# Patient Record
Sex: Female | Born: 1975 | Race: Black or African American | Hispanic: No | Marital: Single | Smoking: Never smoker
Health system: Southern US, Community
[De-identification: ages and names within clinical notes are randomized; demographics above are authoritative.]

## PROBLEM LIST (undated history)

## (undated) DIAGNOSIS — Z87442 Personal history of urinary calculi: Secondary | ICD-10-CM

## (undated) DIAGNOSIS — K219 Gastro-esophageal reflux disease without esophagitis: Secondary | ICD-10-CM

## (undated) DIAGNOSIS — R760 Raised antibody titer: Secondary | ICD-10-CM

## (undated) DIAGNOSIS — J189 Pneumonia, unspecified organism: Secondary | ICD-10-CM

## (undated) DIAGNOSIS — E669 Obesity, unspecified: Secondary | ICD-10-CM

## (undated) DIAGNOSIS — D649 Anemia, unspecified: Secondary | ICD-10-CM

## (undated) DIAGNOSIS — E611 Iron deficiency: Secondary | ICD-10-CM

## (undated) DIAGNOSIS — Z8719 Personal history of other diseases of the digestive system: Secondary | ICD-10-CM

## (undated) DIAGNOSIS — I890 Lymphedema, not elsewhere classified: Secondary | ICD-10-CM

## (undated) DIAGNOSIS — M1990 Unspecified osteoarthritis, unspecified site: Secondary | ICD-10-CM

## (undated) DIAGNOSIS — G4730 Sleep apnea, unspecified: Secondary | ICD-10-CM

## (undated) DIAGNOSIS — E538 Deficiency of other specified B group vitamins: Secondary | ICD-10-CM

## (undated) DIAGNOSIS — J45909 Unspecified asthma, uncomplicated: Secondary | ICD-10-CM

## (undated) DIAGNOSIS — M5136 Other intervertebral disc degeneration, lumbar region: Secondary | ICD-10-CM

## (undated) DIAGNOSIS — I2699 Other pulmonary embolism without acute cor pulmonale: Secondary | ICD-10-CM

## (undated) DIAGNOSIS — I872 Venous insufficiency (chronic) (peripheral): Secondary | ICD-10-CM

## (undated) DIAGNOSIS — M51369 Other intervertebral disc degeneration, lumbar region without mention of lumbar back pain or lower extremity pain: Secondary | ICD-10-CM

## (undated) DIAGNOSIS — R519 Headache, unspecified: Secondary | ICD-10-CM

## (undated) HISTORY — PX: BREAST REDUCTION SURGERY: SHX8

## (undated) HISTORY — PX: PLANTAR FASCIA SURGERY: SHX746

## (undated) HISTORY — PX: HERNIA REPAIR: SHX51

## (undated) HISTORY — DX: Other intervertebral disc degeneration, lumbar region: M51.36

## (undated) HISTORY — PX: GASTRIC BYPASS: SHX52

## (undated) HISTORY — DX: Other intervertebral disc degeneration, lumbar region without mention of lumbar back pain or lower extremity pain: M51.369

## (undated) HISTORY — PX: KNEE SURGERY: SHX244

## (undated) HISTORY — PX: CHOLECYSTECTOMY: SHX55

## (undated) HISTORY — PX: NASAL SINUS SURGERY: SHX719

## (undated) HISTORY — PX: COLONOSCOPY: SHX174

---

## 2003-01-18 HISTORY — PX: REDUCTION MAMMAPLASTY: SUR839

## 2003-02-18 ENCOUNTER — Other Ambulatory Visit: Payer: Self-pay

## 2003-12-01 ENCOUNTER — Emergency Department: Payer: Self-pay | Admitting: Emergency Medicine

## 2004-01-18 HISTORY — PX: BREAST BIOPSY: SHX20

## 2004-08-10 ENCOUNTER — Emergency Department: Payer: Self-pay | Admitting: Emergency Medicine

## 2004-08-19 ENCOUNTER — Ambulatory Visit: Payer: Self-pay | Admitting: Internal Medicine

## 2005-01-18 ENCOUNTER — Ambulatory Visit: Payer: Self-pay

## 2005-07-18 ENCOUNTER — Ambulatory Visit: Payer: Self-pay | Admitting: General Surgery

## 2005-09-16 ENCOUNTER — Ambulatory Visit: Payer: Self-pay | Admitting: Podiatry

## 2005-11-30 ENCOUNTER — Ambulatory Visit: Payer: Self-pay | Admitting: Internal Medicine

## 2006-06-14 ENCOUNTER — Emergency Department: Payer: Self-pay | Admitting: Emergency Medicine

## 2006-06-14 ENCOUNTER — Other Ambulatory Visit: Payer: Self-pay

## 2006-06-19 ENCOUNTER — Emergency Department: Payer: Self-pay | Admitting: Emergency Medicine

## 2006-06-19 ENCOUNTER — Other Ambulatory Visit: Payer: Self-pay

## 2006-06-29 ENCOUNTER — Ambulatory Visit: Payer: Self-pay | Admitting: Internal Medicine

## 2006-08-17 ENCOUNTER — Ambulatory Visit: Payer: Self-pay | Admitting: Specialist

## 2007-03-06 ENCOUNTER — Ambulatory Visit: Payer: Self-pay | Admitting: Internal Medicine

## 2007-06-06 ENCOUNTER — Emergency Department: Payer: Self-pay | Admitting: Emergency Medicine

## 2007-10-08 ENCOUNTER — Emergency Department: Payer: Self-pay | Admitting: Emergency Medicine

## 2008-02-04 ENCOUNTER — Emergency Department: Payer: Self-pay | Admitting: Emergency Medicine

## 2008-08-01 ENCOUNTER — Emergency Department: Payer: Self-pay | Admitting: Emergency Medicine

## 2008-12-02 ENCOUNTER — Ambulatory Visit: Payer: Self-pay | Admitting: Internal Medicine

## 2008-12-09 ENCOUNTER — Ambulatory Visit: Payer: Self-pay | Admitting: Internal Medicine

## 2008-12-10 ENCOUNTER — Ambulatory Visit: Payer: Self-pay | Admitting: Podiatry

## 2009-09-27 ENCOUNTER — Emergency Department: Payer: Self-pay | Admitting: Emergency Medicine

## 2009-10-15 ENCOUNTER — Ambulatory Visit: Payer: Self-pay | Admitting: Unknown Physician Specialty

## 2009-11-18 ENCOUNTER — Ambulatory Visit: Payer: Self-pay | Admitting: Unknown Physician Specialty

## 2009-11-23 ENCOUNTER — Emergency Department: Payer: Self-pay | Admitting: Emergency Medicine

## 2009-11-25 ENCOUNTER — Emergency Department: Payer: Self-pay | Admitting: Emergency Medicine

## 2010-05-20 ENCOUNTER — Emergency Department: Payer: Self-pay | Admitting: Emergency Medicine

## 2010-11-09 ENCOUNTER — Emergency Department: Payer: Self-pay | Admitting: Emergency Medicine

## 2010-11-10 ENCOUNTER — Emergency Department (HOSPITAL_COMMUNITY): Payer: BC Managed Care – PPO

## 2010-11-10 ENCOUNTER — Emergency Department: Payer: Self-pay | Admitting: *Deleted

## 2010-11-10 ENCOUNTER — Emergency Department (HOSPITAL_COMMUNITY)
Admission: EM | Admit: 2010-11-10 | Discharge: 2010-11-10 | Disposition: A | Discharge status: Home / Self Care | Payer: BC Managed Care – PPO | Attending: Emergency Medicine | Admitting: Emergency Medicine

## 2010-11-10 DIAGNOSIS — Z86718 Personal history of other venous thrombosis and embolism: Secondary | ICD-10-CM | POA: Insufficient documentation

## 2010-11-10 DIAGNOSIS — R0602 Shortness of breath: Secondary | ICD-10-CM | POA: Insufficient documentation

## 2010-11-10 DIAGNOSIS — M542 Cervicalgia: Secondary | ICD-10-CM | POA: Insufficient documentation

## 2010-11-10 DIAGNOSIS — R071 Chest pain on breathing: Secondary | ICD-10-CM | POA: Insufficient documentation

## 2010-11-10 DIAGNOSIS — R51 Headache: Secondary | ICD-10-CM | POA: Insufficient documentation

## 2010-11-10 LAB — POCT I-STAT, CHEM 8
Calcium, Ion: 1.19 mmol/L (ref 1.12–1.32)
Chloride: 104 mEq/L (ref 96–112)
Glucose, Bld: 87 mg/dL (ref 70–99)
HCT: 36 % (ref 36.0–46.0)
TCO2: 23 mmol/L (ref 0–100)

## 2010-11-10 LAB — CBC
MCH: 29.3 pg (ref 26.0–34.0)
MCHC: 34.4 g/dL (ref 30.0–36.0)
MCV: 85.2 fL (ref 78.0–100.0)
Platelets: 320 10*3/uL (ref 150–400)
RDW: 13.9 % (ref 11.5–15.5)

## 2010-11-10 LAB — DIFFERENTIAL
Basophils Relative: 1 % (ref 0–1)
Eosinophils Absolute: 0.1 10*3/uL (ref 0.0–0.7)
Eosinophils Relative: 2 % (ref 0–5)
Lymphs Abs: 2.3 10*3/uL (ref 0.7–4.0)
Monocytes Absolute: 0.6 10*3/uL (ref 0.1–1.0)
Monocytes Relative: 9 % (ref 3–12)
Neutrophils Relative %: 57 % (ref 43–77)

## 2010-11-10 LAB — BASIC METABOLIC PANEL
BUN: 8 mg/dL (ref 6–23)
Creatinine, Ser: 0.69 mg/dL (ref 0.50–1.10)
GFR calc Af Amer: >90 mL/min (ref >90)
GFR calc non Af Amer: >90 mL/min (ref >90)
Glucose, Bld: 96 mg/dL (ref 70–99)
Potassium: 4 mEq/L (ref 3.5–5.1)

## 2010-11-10 LAB — POCT I-STAT TROPONIN I: Troponin i, poc: 0.01 ng/mL (ref 0.00–0.08)

## 2010-11-10 MED ORDER — IOHEXOL 300 MG/ML  SOLN
70.0000 mL | Freq: Once | INTRAMUSCULAR | Status: AC | PRN
  Administered 2010-11-10: 70 mL via INTRAVENOUS

## 2010-11-24 ENCOUNTER — Ambulatory Visit: Payer: Self-pay | Admitting: Internal Medicine

## 2011-05-26 ENCOUNTER — Ambulatory Visit: Payer: Self-pay | Admitting: Allergy and Immunology

## 2011-06-27 ENCOUNTER — Emergency Department: Payer: Self-pay | Admitting: Emergency Medicine

## 2011-09-23 ENCOUNTER — Emergency Department: Payer: Self-pay | Admitting: Emergency Medicine

## 2011-11-16 ENCOUNTER — Emergency Department: Payer: Self-pay | Admitting: Internal Medicine

## 2011-11-16 LAB — CBC WITH DIFFERENTIAL/PLATELET
Basophil #: 0 10*3/uL (ref 0.0–0.1)
Basophil %: 0.3 %
HCT: 39 % (ref 35.0–47.0)
HGB: 13.1 g/dL (ref 12.0–16.0)
Lymphocyte #: 1.7 10*3/uL (ref 1.0–3.6)
Lymphocyte %: 25.8 %
MCHC: 33.5 g/dL (ref 32.0–36.0)
MCV: 85 fL (ref 80–100)
Monocyte %: 7.6 %
Neutrophil #: 4 10*3/uL (ref 1.4–6.5)
RDW: 14.1 % (ref 11.5–14.5)

## 2011-11-16 LAB — BASIC METABOLIC PANEL
Anion Gap: 9 (ref 7–16)
BUN: 9 mg/dL (ref 7–18)
Calcium, Total: 8.8 mg/dL (ref 8.5–10.1)
EGFR (African American): >60
EGFR (Non-African Amer.): >60
Glucose: 87 mg/dL (ref 65–99)
Osmolality: 283 (ref 275–301)

## 2011-12-07 ENCOUNTER — Ambulatory Visit: Payer: Self-pay | Admitting: Obstetrics and Gynecology

## 2012-02-28 ENCOUNTER — Ambulatory Visit: Payer: Self-pay | Admitting: Obstetrics and Gynecology

## 2012-04-21 ENCOUNTER — Emergency Department: Payer: Self-pay | Admitting: Unknown Physician Specialty

## 2012-04-21 LAB — BASIC METABOLIC PANEL
Anion Gap: 7 (ref 7–16)
Calcium, Total: 8.3 mg/dL — ABNORMAL LOW (ref 8.5–10.1)
Chloride: 113 mmol/L — ABNORMAL HIGH (ref 98–107)
Co2: 20 mmol/L — ABNORMAL LOW (ref 21–32)
EGFR (African American): >60
EGFR (Non-African Amer.): >60
Sodium: 140 mmol/L (ref 136–145)

## 2012-04-21 LAB — CBC
HCT: 36.8 % (ref 35.0–47.0)
MCV: 85 fL (ref 80–100)
Platelet: 293 10*3/uL (ref 150–440)
RDW: 14.7 % — ABNORMAL HIGH (ref 11.5–14.5)
WBC: 8.2 10*3/uL (ref 3.6–11.0)

## 2012-04-21 LAB — TROPONIN I
Troponin-I: <0.02 ng/mL
Troponin-I: <0.02 ng/mL

## 2012-05-03 ENCOUNTER — Emergency Department: Payer: Self-pay | Admitting: Emergency Medicine

## 2012-05-03 LAB — URINALYSIS, COMPLETE
Bacteria: NONE SEEN
Bilirubin,UR: NEGATIVE
Nitrite: NEGATIVE
RBC,UR: 1 /HPF (ref 0–5)
Specific Gravity: 1.016 (ref 1.003–1.030)

## 2012-05-03 LAB — CBC
MCHC: 33.3 g/dL (ref 32.0–36.0)
WBC: 7.9 10*3/uL (ref 3.6–11.0)

## 2012-05-03 LAB — COMPREHENSIVE METABOLIC PANEL
Alkaline Phosphatase: 102 U/L (ref 50–136)
Anion Gap: 6 — ABNORMAL LOW (ref 7–16)
Bilirubin,Total: 0.2 mg/dL (ref 0.2–1.0)
Calcium, Total: 8.4 mg/dL — ABNORMAL LOW (ref 8.5–10.1)
Chloride: 113 mmol/L — ABNORMAL HIGH (ref 98–107)
Co2: 23 mmol/L (ref 21–32)
Creatinine: 0.98 mg/dL (ref 0.60–1.30)
EGFR (Non-African Amer.): >60
Potassium: 3.4 mmol/L — ABNORMAL LOW (ref 3.5–5.1)
Sodium: 142 mmol/L (ref 136–145)

## 2012-05-03 LAB — CK TOTAL AND CKMB (NOT AT ARMC): CK-MB: <0.5 ng/mL — ABNORMAL LOW (ref 0.5–3.6)

## 2012-05-03 LAB — TROPONIN I: Troponin-I: <0.02 ng/mL

## 2012-06-07 IMAGING — MG MAM DGTL SCRN MAM NO ORDER W/CAD
1 series · 5 of 5 positions shown · non-contrast
Comparison: none

REASON FOR EXAM: scr mammo no order
COMMENTS:

[Series 9272: R CC · right · 5 of 5 slices shown]
[im 1/5]
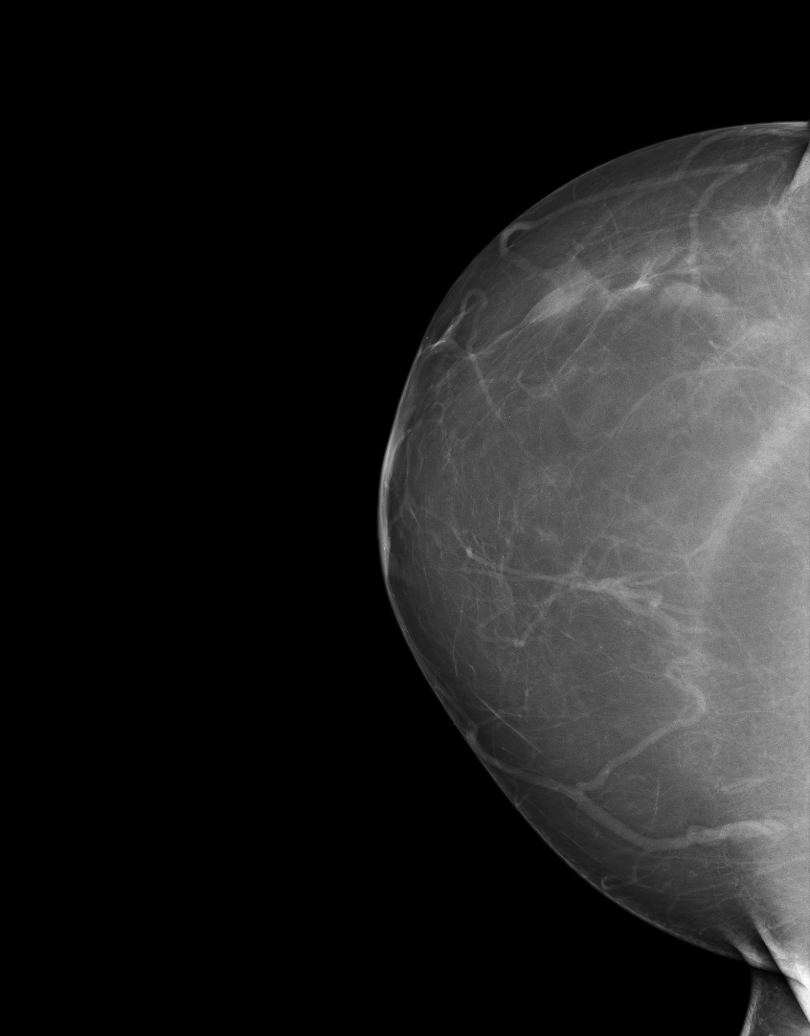
[im 2/5]
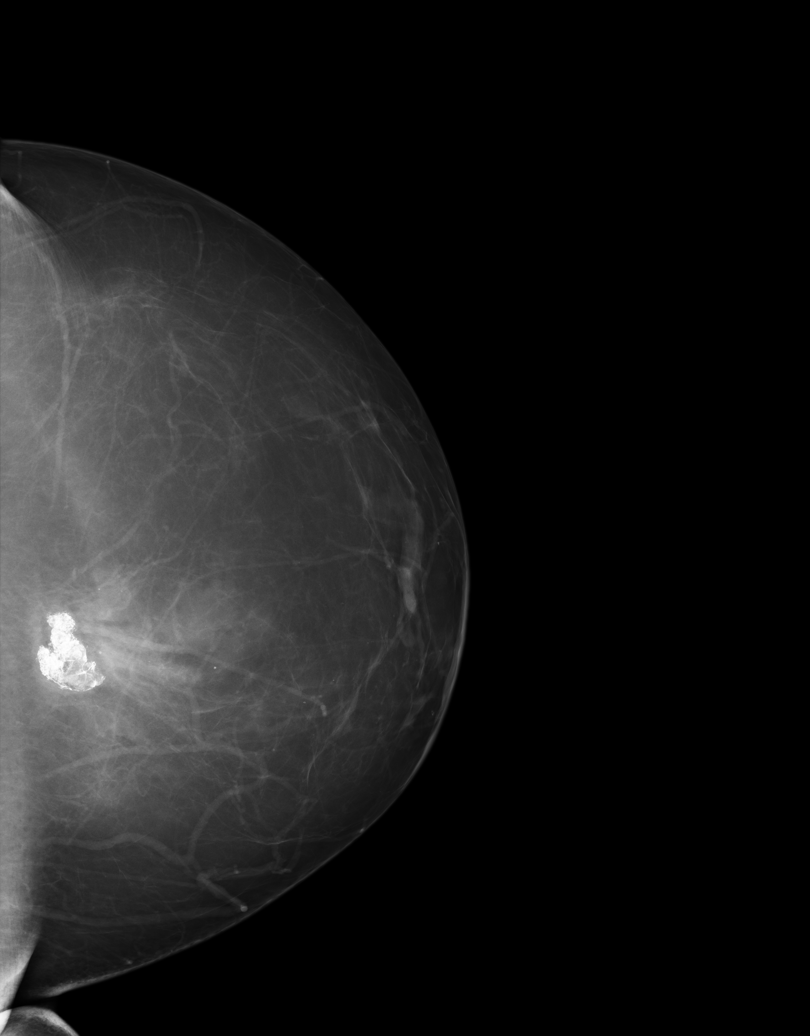
[im 3/5]
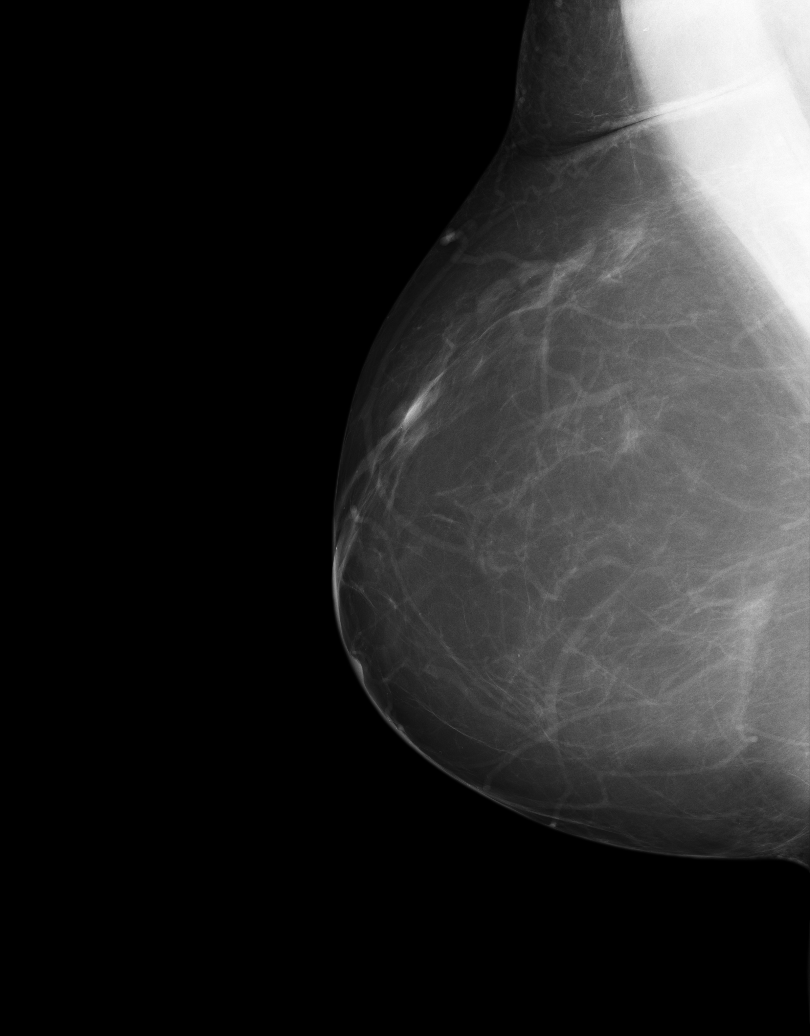
[im 4/5]
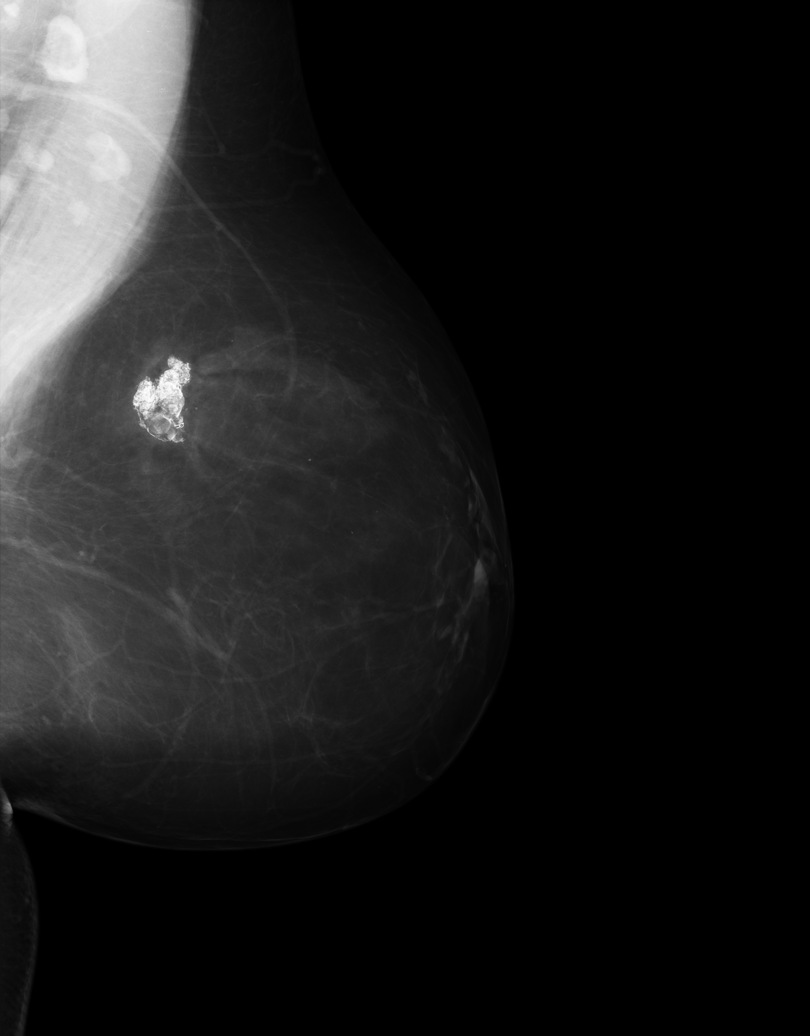
[im 5/5]
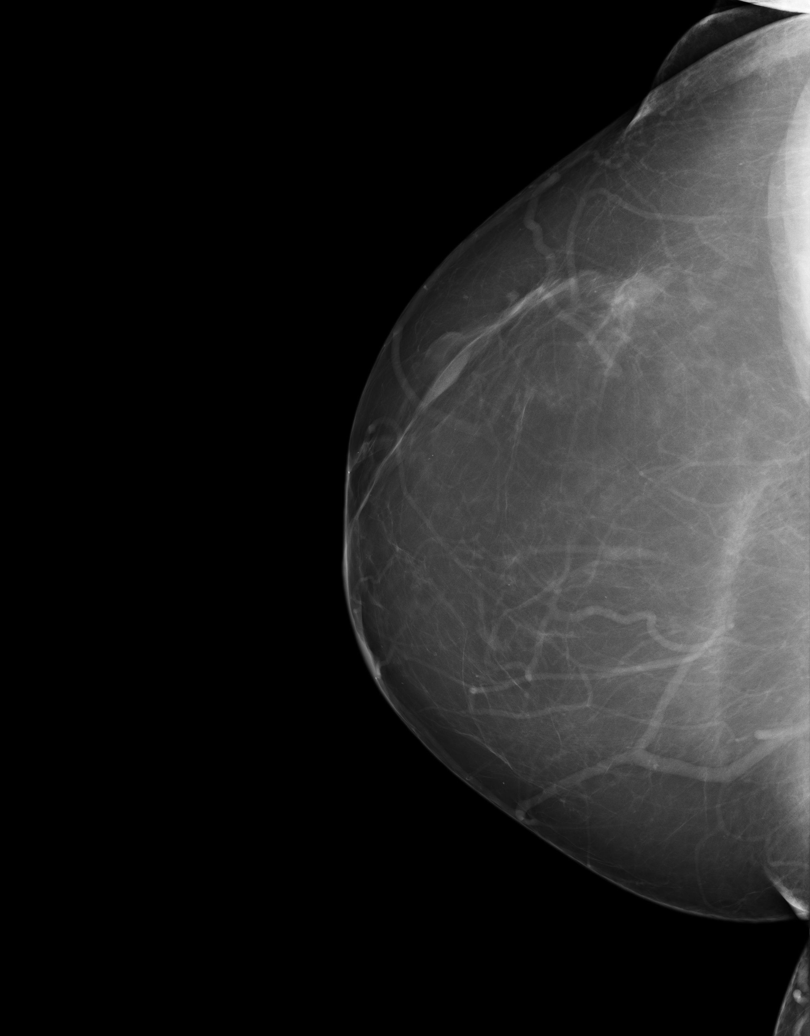

[5 of 5 positions shown; findings below may reference images not displayed]

PROCEDURE:     MAM - MAM DGTL SCRN MAM NO ORDER W/CAD  - November 24, 2010  [DATE]

RESULT:     There is a family history of breast cancer in the patient's
mother. The patient has undergone previous left breast biopsy in 2997 with
bilateral breast reduction in December 2003.  Comparison is made to the
previous digital images of 12-02-2008, as well as 07-18-2005. Coarse
calcification is seen along the medial posterior upper left breast. This is
much smaller than seen on the 2997 study but more densely calcified and is
likely postoperative change. No developing parenchymal density or dominant
mass is present.
IMPRESSION: 1.Stable, benign appearing bilateral mammogram.

BI-RADS: Category 2 - Benign Findings

RECOMMENDATIONS:

1.     Please continue to encourage yearly mammographic follow-up.

A NEGATIVE MAMMOGRAM REPORT DOES NOT PRECLUDE BIOPSY OR OTHER EVALUATION OF
A CLINICALLY PALPABLE OR OTHERWISE SUSPICIOUS MASS OR LESION. BREAST CANCER
MAY NOT BE DETECTED BY MAMMOGRAPHY IN UP TO 10% OF CASES.

## 2012-10-23 ENCOUNTER — Ambulatory Visit: Payer: Self-pay | Admitting: Obstetrics and Gynecology

## 2012-11-11 ENCOUNTER — Emergency Department: Payer: Self-pay | Admitting: Emergency Medicine

## 2012-11-11 LAB — URINALYSIS, COMPLETE
Leukocyte Esterase: NEGATIVE
Nitrite: NEGATIVE
Ph: 5 (ref 4.5–8.0)
Protein: 100

## 2012-12-31 ENCOUNTER — Emergency Department: Payer: Self-pay | Admitting: Emergency Medicine

## 2013-01-17 HISTORY — PX: BREAST BIOPSY: SHX20

## 2013-04-10 ENCOUNTER — Ambulatory Visit: Payer: Self-pay | Admitting: Obstetrics and Gynecology

## 2013-04-22 ENCOUNTER — Ambulatory Visit: Payer: Self-pay | Admitting: Obstetrics and Gynecology

## 2013-05-06 ENCOUNTER — Ambulatory Visit: Payer: Self-pay | Admitting: Obstetrics and Gynecology

## 2013-05-08 LAB — PATHOLOGY REPORT

## 2013-06-19 ENCOUNTER — Emergency Department: Payer: Self-pay | Admitting: Emergency Medicine

## 2013-06-19 LAB — URINALYSIS, COMPLETE
BACTERIA: NONE SEEN
BILIRUBIN, UR: NEGATIVE
Glucose,UR: NEGATIVE mg/dL (ref 0–75)
KETONE: NEGATIVE
Nitrite: NEGATIVE
PH: 7 (ref 4.5–8.0)
PROTEIN: NEGATIVE
RBC,UR: 2 /HPF (ref 0–5)
SPECIFIC GRAVITY: 1.018 (ref 1.003–1.030)
WBC UR: 2 /HPF (ref 0–5)

## 2013-06-19 LAB — CBC WITH DIFFERENTIAL/PLATELET
BASOS ABS: 0.1 10*3/uL (ref 0.0–0.1)
BASOS PCT: 0.8 %
EOS ABS: 0.2 10*3/uL (ref 0.0–0.7)
Eosinophil %: 2.9 %
HCT: 38.4 % (ref 35.0–47.0)
HGB: 12.7 g/dL (ref 12.0–16.0)
LYMPHS PCT: 32.6 %
Lymphocyte #: 2.5 10*3/uL (ref 1.0–3.6)
MCH: 28.6 pg (ref 26.0–34.0)
MCHC: 33 g/dL (ref 32.0–36.0)
MCV: 87 fL (ref 80–100)
MONOS PCT: 7.6 %
Monocyte #: 0.6 x10 3/mm (ref 0.2–0.9)
NEUTROS ABS: 4.4 10*3/uL (ref 1.4–6.5)
Neutrophil %: 56.1 %
Platelet: 281 10*3/uL (ref 150–440)
RBC: 4.43 10*6/uL (ref 3.80–5.20)
RDW: 14.7 % — AB (ref 11.5–14.5)
WBC: 7.8 10*3/uL (ref 3.6–11.0)

## 2013-06-19 LAB — COMPREHENSIVE METABOLIC PANEL
ALBUMIN: 3.3 g/dL — AB (ref 3.4–5.0)
ALK PHOS: 101 U/L
ALT: 25 U/L (ref 12–78)
ANION GAP: 7 (ref 7–16)
BILIRUBIN TOTAL: 0.3 mg/dL (ref 0.2–1.0)
BUN: 7 mg/dL (ref 7–18)
CHLORIDE: 105 mmol/L (ref 98–107)
CO2: 26 mmol/L (ref 21–32)
Calcium, Total: 8.5 mg/dL (ref 8.5–10.1)
Creatinine: 0.7 mg/dL (ref 0.60–1.30)
EGFR (African American): >60
Glucose: 85 mg/dL (ref 65–99)
OSMOLALITY: 273 (ref 275–301)
POTASSIUM: 3.6 mmol/L (ref 3.5–5.1)
SGOT(AST): 19 U/L (ref 15–37)
Sodium: 138 mmol/L (ref 136–145)
TOTAL PROTEIN: 7.4 g/dL (ref 6.4–8.2)

## 2013-06-19 LAB — PREGNANCY, URINE: Pregnancy Test, Urine: NEGATIVE m[IU]/mL

## 2013-08-21 ENCOUNTER — Emergency Department: Payer: Self-pay | Admitting: Emergency Medicine

## 2013-08-23 ENCOUNTER — Emergency Department: Payer: Self-pay | Admitting: Emergency Medicine

## 2013-08-23 LAB — COMPREHENSIVE METABOLIC PANEL
ALBUMIN: 3.4 g/dL (ref 3.4–5.0)
ALK PHOS: 99 U/L
AST: 23 U/L (ref 15–37)
Anion Gap: 10 (ref 7–16)
BUN: 7 mg/dL (ref 7–18)
Bilirubin,Total: 0.2 mg/dL (ref 0.2–1.0)
CALCIUM: 8.6 mg/dL (ref 8.5–10.1)
CHLORIDE: 105 mmol/L (ref 98–107)
CREATININE: 0.84 mg/dL (ref 0.60–1.30)
Co2: 25 mmol/L (ref 21–32)
EGFR (African American): >60
Glucose: 86 mg/dL (ref 65–99)
OSMOLALITY: 277 (ref 275–301)
POTASSIUM: 3.7 mmol/L (ref 3.5–5.1)
SGPT (ALT): 24 U/L
Sodium: 140 mmol/L (ref 136–145)
Total Protein: 7.9 g/dL (ref 6.4–8.2)

## 2013-08-23 LAB — URINALYSIS, COMPLETE
Bilirubin,UR: NEGATIVE
Glucose,UR: NEGATIVE mg/dL (ref 0–75)
Ketone: NEGATIVE
Nitrite: NEGATIVE
Ph: 6 (ref 4.5–8.0)
Specific Gravity: 1.008 (ref 1.003–1.030)
Squamous Epithelial: 9
WBC UR: 110 /HPF (ref 0–5)

## 2013-08-23 LAB — CBC WITH DIFFERENTIAL/PLATELET
Basophil #: 0.1 10*3/uL (ref 0.0–0.1)
Basophil %: 0.8 %
EOS ABS: 0.2 10*3/uL (ref 0.0–0.7)
Eosinophil %: 3.5 %
HCT: 40.3 % (ref 35.0–47.0)
HGB: 13 g/dL (ref 12.0–16.0)
LYMPHS ABS: 2.3 10*3/uL (ref 1.0–3.6)
Lymphocyte %: 33 %
MCH: 28.3 pg (ref 26.0–34.0)
MCHC: 32.3 g/dL (ref 32.0–36.0)
MCV: 87 fL (ref 80–100)
MONOS PCT: 8.4 %
Monocyte #: 0.6 x10 3/mm (ref 0.2–0.9)
NEUTROS PCT: 54.3 %
Neutrophil #: 3.8 10*3/uL (ref 1.4–6.5)
Platelet: 332 10*3/uL (ref 150–440)
RBC: 4.61 10*6/uL (ref 3.80–5.20)
RDW: 14.6 % — ABNORMAL HIGH (ref 11.5–14.5)
WBC: 7 10*3/uL (ref 3.6–11.0)

## 2013-08-23 LAB — GC/CHLAMYDIA PROBE AMP

## 2013-08-23 LAB — WET PREP, GENITAL

## 2013-12-31 DIAGNOSIS — M722 Plantar fascial fibromatosis: Secondary | ICD-10-CM | POA: Insufficient documentation

## 2014-01-08 ENCOUNTER — Emergency Department: Payer: Self-pay | Admitting: Emergency Medicine

## 2014-02-26 ENCOUNTER — Emergency Department: Payer: Self-pay | Admitting: Emergency Medicine

## 2014-02-27 ENCOUNTER — Emergency Department: Payer: Self-pay | Admitting: Emergency Medicine

## 2014-02-28 ENCOUNTER — Emergency Department: Payer: Self-pay | Admitting: Emergency Medicine

## 2014-04-24 ENCOUNTER — Ambulatory Visit
Admit: 2014-04-24 | Disposition: A | Discharge status: Home / Self Care | Payer: Self-pay | Attending: Obstetrics and Gynecology | Admitting: Obstetrics and Gynecology

## 2014-06-18 ENCOUNTER — Emergency Department: Payer: BC Managed Care – PPO

## 2014-06-18 ENCOUNTER — Encounter: Payer: Self-pay | Admitting: Urgent Care

## 2014-06-18 ENCOUNTER — Emergency Department
Admission: EM | Admit: 2014-06-18 | Discharge: 2014-06-18 | Disposition: A | Discharge status: Home / Self Care | Payer: BC Managed Care – PPO | Attending: Emergency Medicine | Admitting: Emergency Medicine

## 2014-06-18 DIAGNOSIS — Y9289 Other specified places as the place of occurrence of the external cause: Secondary | ICD-10-CM | POA: Insufficient documentation

## 2014-06-18 DIAGNOSIS — Z91040 Latex allergy status: Secondary | ICD-10-CM | POA: Diagnosis not present

## 2014-06-18 DIAGNOSIS — W2209XA Striking against other stationary object, initial encounter: Secondary | ICD-10-CM | POA: Diagnosis not present

## 2014-06-18 DIAGNOSIS — Y9389 Activity, other specified: Secondary | ICD-10-CM | POA: Insufficient documentation

## 2014-06-18 DIAGNOSIS — S9032XA Contusion of left foot, initial encounter: Secondary | ICD-10-CM

## 2014-06-18 DIAGNOSIS — Y998 Other external cause status: Secondary | ICD-10-CM | POA: Insufficient documentation

## 2014-06-18 DIAGNOSIS — S99922A Unspecified injury of left foot, initial encounter: Secondary | ICD-10-CM | POA: Diagnosis present

## 2014-06-18 MED ORDER — KETOROLAC TROMETHAMINE 10 MG PO TABS: 10.0000 mg | ORAL_TABLET | Freq: Three times a day (TID) | ORAL | Status: DC | PRN

## 2014-06-18 MED ORDER — KETOROLAC TROMETHAMINE 10 MG PO TABS
ORAL_TABLET | ORAL | Status: AC
  Administered 2014-06-18: 10 mg via ORAL
  Filled 2014-06-18: qty 1

## 2014-06-18 MED ORDER — KETOROLAC TROMETHAMINE 10 MG PO TABS
10.0000 mg | ORAL_TABLET | Freq: Once | ORAL | Status: AC
  Administered 2014-06-18: 10 mg via ORAL

## 2014-06-18 NOTE — ED Notes (Signed)
Patient presents with c/o LEFT foot pain s/p "hitting it on the side of the bathtub".

## 2014-06-18 NOTE — ED Provider Notes (Signed)
[facility] Emergency Department Provider Note  ____________________________________________  Time seen: 4:30 AM  I have reviewed the triage vital signs and the nursing notes.   HISTORY  Chief Complaint Foot Pain      HPI Debbie Sosa is a 39 y.o. female presents with history of accidentally striking left foot against the bathtub prior to presentation. Patient admits to currently 8 out of 10 pain positive swelling noted to the foot.     History reviewed. No pertinent past medical history.  There are no active problems to display for this patient.   Past Surgical History  Procedure Laterality Date  . Cesarean section    . Cholecystectomy    . Knee surgery    . Plantar fascia surgery      x 3  . Breast reduction surgery      No current outpatient prescriptions on file.  Allergies Latex; Morphine and related; Pyridium; Sulfa antibiotics; Tramadol; and Vioxx  No family history on file.  Social History History  Substance Use Topics  . Smoking status: Never Smoker   . Smokeless tobacco: Not on file  . Alcohol Use: No    Review of Systems  Constitutional: Negative for fever. Eyes: Negative for visual changes. ENT: Negative for sore throat. Cardiovascular: Negative for chest pain. Respiratory: Negative for shortness of breath. Gastrointestinal: Negative for abdominal pain, vomiting and diarrhea. Genitourinary: Negative for dysuria. Musculoskeletal: Negative for back pain. Positive left foot pain and swelling Skin: Negative for rash. Neurological: Negative for headaches, focal weakness or numbness.   10-point ROS otherwise negative.  ____________________________________________   PHYSICAL EXAM:  VITAL SIGNS: ED Triage Vitals  Enc Vitals Group     BP --      Pulse --      Resp --      Temp --      Temp src --      SpO2 --      Weight --      Height --      Head Cir --      Peak Flow --      Pain Score 06/18/14  0055 10     Pain Loc --      Pain Edu? --      Excl. in [place]? --      Constitutional: Alert and oriented. Well appearing and in no distress. Eyes: Conjunctivae are normal. PERRL. Normal extraocular movements. ENT   Head: Normocephalic and atraumatic.   Nose: No congestion/rhinnorhea.   Mouth/Throat: Mucous membranes are moist.   Neck: No stridor. Hematological/Lymphatic/Immunilogical: No cervical lymphadenopathy. Cardiovascular: Normal rate, regular rhythm. Normal and symmetric distal pulses are present in all extremities. No murmurs, rubs, or gallops. Respiratory: Normal respiratory effort without tachypnea nor retractions. Breath sounds are clear and equal bilaterally. No wheezes/rales/rhonchi. Gastrointestinal: Soft and nontender. No distention. There is no CVA tenderness. Genitourinary: deferred Musculoskeletal: Nontender with normal range of motion in all extremities. No joint effusions.  No lower extremity tenderness nor edema. Dorsal aspect of left foot pain with palpation and swelling. Neurologic:  Normal speech and language. No gross focal neurologic deficits are appreciated. Speech is normal.  Skin:  Skin is warm, dry and intact. No rash noted. Psychiatric: Mood and affect are normal. Speech and behavior are normal. Patient exhibits appropriate insight and judgment.  ____________________________________________     RADIOLOGY  No fracture or dislocation noted on left foot x-ray per radiologist.  ____________________________________________     INITIAL IMPRESSION / ASSESSMENT  AND PLAN / ED COURSE  Pertinent labs & imaging results that were available during my care of the patient were reviewed by me and considered in my medical decision making (see chart for details).  Patient received Toradol 10 mg tablets Ace wrap applied to the left foot.  ____________________________________________   FINAL CLINICAL IMPRESSION(S) / ED DIAGNOSES  Final diagnoses:   Foot contusion, left, initial encounter      Gregor Hams, MD 06/18/14 [phone]

## 2014-06-18 NOTE — Discharge Instructions (Signed)
Contusion °A contusion is a deep bruise. Contusions are the result of an injury that caused bleeding under the skin. The contusion may turn blue, purple, or yellow. Minor injuries will give you a painless contusion, but more severe contusions may stay painful and swollen for a few weeks.  °CAUSES  °A contusion is usually caused by a blow, trauma, or direct force to an area of the body. °SYMPTOMS  °· Swelling and redness of the injured area. °· Bruising of the injured area. °· Tenderness and soreness of the injured area. °· Pain. °DIAGNOSIS  °The diagnosis can be made by taking a history and physical exam. An X-ray, CT scan, or MRI may be needed to determine if there were any associated injuries, such as fractures. °TREATMENT  °Specific treatment will depend on what area of the body was injured. In general, the best treatment for a contusion is resting, icing, elevating, and applying cold compresses to the injured area. Over-the-counter medicines may also be recommended for pain control. Ask your caregiver what the best treatment is for your contusion. °HOME CARE INSTRUCTIONS  °· Put ice on the injured area. °¨ Put ice in a plastic bag. °¨ Place a towel between your skin and the bag. °¨ Leave the ice on for 15-20 minutes, 3-4 times a day, or as directed by your health care provider. °· Only take over-the-counter or prescription medicines for pain, discomfort, or fever as directed by your caregiver. Your caregiver may recommend avoiding anti-inflammatory medicines (aspirin, ibuprofen, and naproxen) for 48 hours because these medicines may increase bruising. °· Rest the injured area. °· If possible, elevate the injured area to reduce swelling. °SEEK IMMEDIATE MEDICAL CARE IF:  °· You have increased bruising or swelling. °· You have pain that is getting worse. °· Your swelling or pain is not relieved with medicines. °MAKE SURE YOU:  °· Understand these instructions. °· Will watch your condition. °· Will get help right  away if you are not doing well or get worse. °Document Released: 10/13/2004 Document Revised: 01/08/2013 Document Reviewed: 11/08/2010 °Exit[employer]® Patient Information ©2015 [employer]. This information is not intended to replace advice given to you by your health care provider. Make sure you discuss any questions you have with your health care provider. ° °

## 2014-07-07 ENCOUNTER — Emergency Department: Payer: BC Managed Care – PPO

## 2014-07-07 ENCOUNTER — Encounter: Payer: Self-pay | Admitting: *Deleted

## 2014-07-07 ENCOUNTER — Emergency Department
Admission: EM | Admit: 2014-07-07 | Discharge: 2014-07-07 | Disposition: A | Discharge status: Home / Self Care | Payer: BC Managed Care – PPO | Attending: Emergency Medicine | Admitting: Emergency Medicine

## 2014-07-07 DIAGNOSIS — I2699 Other pulmonary embolism without acute cor pulmonale: Secondary | ICD-10-CM | POA: Insufficient documentation

## 2014-07-07 DIAGNOSIS — J029 Acute pharyngitis, unspecified: Secondary | ICD-10-CM | POA: Diagnosis present

## 2014-07-07 DIAGNOSIS — J039 Acute tonsillitis, unspecified: Secondary | ICD-10-CM | POA: Diagnosis not present

## 2014-07-07 DIAGNOSIS — Z91040 Latex allergy status: Secondary | ICD-10-CM | POA: Diagnosis not present

## 2014-07-07 HISTORY — DX: Other pulmonary embolism without acute cor pulmonale: I26.99

## 2014-07-07 HISTORY — DX: Unspecified asthma, uncomplicated: J45.909

## 2014-07-07 LAB — CBC WITH DIFFERENTIAL/PLATELET
Basophils Absolute: 0 10*3/uL (ref 0–0.1)
Basophils Relative: 0 %
EOS ABS: 0.6 10*3/uL (ref 0–0.7)
EOS PCT: 7 %
HEMATOCRIT: 40.5 % (ref 35.0–47.0)
HEMOGLOBIN: 13.4 g/dL (ref 12.0–16.0)
LYMPHS ABS: 1.2 10*3/uL (ref 1.0–3.6)
LYMPHS PCT: 14 %
MCH: 29 pg (ref 26.0–34.0)
MCHC: 33.2 g/dL (ref 32.0–36.0)
MCV: 87.2 fL (ref 80.0–100.0)
Monocytes Absolute: 0.6 10*3/uL (ref 0.2–0.9)
Monocytes Relative: 7 %
NEUTROS ABS: 6.6 10*3/uL — AB (ref 1.4–6.5)
Neutrophils Relative %: 72 %
Platelets: 347 10*3/uL (ref 150–440)
RBC: 4.64 MIL/uL (ref 3.80–5.20)
RDW: 14.1 % (ref 11.5–14.5)
WBC: 9 10*3/uL (ref 3.6–11.0)

## 2014-07-07 LAB — BASIC METABOLIC PANEL
ANION GAP: 9 (ref 5–15)
BUN: 9 mg/dL (ref 6–20)
CHLORIDE: 107 mmol/L (ref 101–111)
CO2: 23 mmol/L (ref 22–32)
CREATININE: 0.81 mg/dL (ref 0.44–1.00)
Calcium: 8.4 mg/dL — ABNORMAL LOW (ref 8.9–10.3)
GFR calc non Af Amer: >60 mL/min (ref >60)
Glucose, Bld: 117 mg/dL — ABNORMAL HIGH (ref 65–99)
POTASSIUM: 3.6 mmol/L (ref 3.5–5.1)
Sodium: 139 mmol/L (ref 135–145)

## 2014-07-07 MED ORDER — DEXAMETHASONE SODIUM PHOSPHATE 10 MG/ML IJ SOLN
10.0000 mg | Freq: Once | INTRAMUSCULAR | Status: AC
  Administered 2014-07-07: 10 mg via INTRAVENOUS

## 2014-07-07 MED ORDER — DEXAMETHASONE SODIUM PHOSPHATE 10 MG/ML IJ SOLN
INTRAMUSCULAR | Status: AC
  Administered 2014-07-07: 10 mg via INTRAVENOUS
  Filled 2014-07-07: qty 1

## 2014-07-07 MED ORDER — AMOXICILLIN 500 MG PO CAPS
ORAL_CAPSULE | ORAL | Status: AC
  Administered 2014-07-07: 500 mg via ORAL
  Filled 2014-07-07: qty 1

## 2014-07-07 MED ORDER — METOCLOPRAMIDE HCL 5 MG/ML IJ SOLN
10.0000 mg | Freq: Once | INTRAMUSCULAR | Status: AC
  Administered 2014-07-07: 10 mg via INTRAVENOUS

## 2014-07-07 MED ORDER — DIPHENHYDRAMINE HCL 50 MG/ML IJ SOLN
25.0000 mg | Freq: Once | INTRAMUSCULAR | Status: AC
  Administered 2014-07-07: 25 mg via INTRAVENOUS

## 2014-07-07 MED ORDER — IOHEXOL 300 MG/ML  SOLN
75.0000 mL | Freq: Once | INTRAMUSCULAR | Status: AC | PRN
  Administered 2014-07-07: 75 mL via INTRAVENOUS
  Filled 2014-07-07: qty 75

## 2014-07-07 MED ORDER — PENICILLIN G BENZATHINE 1200000 UNIT/2ML IM SUSP
1.2000 10*6.[IU] | Freq: Once | INTRAMUSCULAR | Status: AC
  Administered 2014-07-07: 1.2 10*6.[IU] via INTRAMUSCULAR
  Filled 2014-07-07: qty 2

## 2014-07-07 MED ORDER — DIPHENHYDRAMINE HCL 50 MG/ML IJ SOLN
INTRAMUSCULAR | Status: AC
  Administered 2014-07-07: 25 mg via INTRAVENOUS
  Filled 2014-07-07: qty 1

## 2014-07-07 MED ORDER — METOCLOPRAMIDE HCL 5 MG/ML IJ SOLN
INTRAMUSCULAR | Status: AC
Start: 2014-07-07 — End: 2014-07-07
  Administered 2014-07-07: 10 mg via INTRAVENOUS
  Filled 2014-07-07: qty 2

## 2014-07-07 MED ORDER — AMOXICILLIN 500 MG PO CAPS
500.0000 mg | ORAL_CAPSULE | Freq: Once | ORAL | Status: AC
  Administered 2014-07-07: 500 mg via ORAL

## 2014-07-07 MED ORDER — SODIUM CHLORIDE 0.9 % IV BOLUS (SEPSIS)
1000.0000 mL | Freq: Once | INTRAVENOUS | Status: AC
  Administered 2014-07-07: 1000 mL via INTRAVENOUS

## 2014-07-07 NOTE — ED Notes (Signed)
C/o sorethroat. Right hip pain, chills, cough

## 2014-07-07 NOTE — ED Provider Notes (Signed)
[facility] Emergency Department Provider Note  ____________________________________________  Time seen: Approximately 10:15 PM  I have reviewed the triage vital signs and the nursing notes.   HISTORY  Chief Complaint Sore Throat    HPI Debbie Sosa is a 39 y.o. female is complaining of sore throat for the last week worsening inflamed said that since Friday she hasn't really been able to eat anything she has been able to take down some fluids but is been incredibly painful rates as a 10 out of 10 sharp burning pain denies any nausea or vomiting is here today for further evaluation and treatment   Past Medical History  Diagnosis Date  . Asthma   . Pulmonary embolism     Patient Active Problem List   Diagnosis Date Noted  . Pulmonary embolism     Past Surgical History  Procedure Laterality Date  . Cesarean section    . Cholecystectomy    . Knee surgery    . Plantar fascia surgery      x 3  . Breast reduction surgery    . Cesarean section      Current Outpatient Rx  Name  Route  Sig  Dispense  Refill  . ketorolac (TORADOL) 10 MG tablet   Oral   Take 1 tablet (10 mg total) by mouth every 8 (eight) hours as needed.   20 tablet   0     Allergies Latex; Morphine and related; Pyridium; Sulfa antibiotics; Tramadol; and Vioxx  No family history on file.  Social History History  Substance Use Topics  . Smoking status: Never Smoker   . Smokeless tobacco: Not on file  . Alcohol Use: No    Review of Systems Constitutional: No fever/chills Eyes: No visual changes. ENT: Denies nasal drainage. Cardiovascular: Denies chest pain. Respiratory: Denies shortness of breath. Gastrointestinal: No abdominal pain.  No nausea, no vomiting.  No diarrhea.  No constipation. Genitourinary: Negative for dysuria. Musculoskeletal: Negative for back pain. Skin: Negative for rash. Neurological: Negative for headaches, focal weakness or  numbness.  10-point ROS otherwise negative.  ____________________________________________   PHYSICAL EXAM:  VITAL SIGNS: ED Triage Vitals  Enc Vitals Group     BP 07/07/14 1815 132/87 mmHg     Pulse Rate 07/07/14 1815 113     Resp 07/07/14 1815 24     Temp 07/07/14 1815 100.7 F (38.2 C)     Temp Source 07/07/14 1815 Oral     SpO2 07/07/14 1815 100 %     Weight 07/07/14 1815 200 lb (90.719 kg)     Height 07/07/14 1815 5\' 1"  (1.549 m)     Head Cir --      Peak Flow --      Pain Score 07/07/14 1816 10     Pain Loc --      Pain Edu? --      Excl. in [place]? --     Constitutional: Alert and oriented. Well appearing and in no acute distress. Eyes: Conjunctivae are normal. PERRL. EOMI. Head: Atraumatic. Nose: No congestion/rhinnorhea. Mouth/Throat: Mucous membranes are moist.  Oropharynx non-erythematous. Neck: No stridor.   Cardiovascular: Normal rate, regular rhythm. Grossly normal heart sounds.  Good peripheral circulation. Respiratory: Normal respiratory effort.  No retractions. Lungs CTAB.  Musculoskeletal: No lower extremity tenderness nor edema.  No joint effusions. Neurologic:  Normal speech and language. No gross focal neurologic deficits are appreciated. Speech is normal. No gait instability. Skin:  Skin is warm, dry and  intact. No rash noted. Psychiatric: Mood and affect are normal. Speech and behavior are normal.  ____________________________________________   LABS (all labs ordered are listed, but only abnormal results are displayed)  Labs Reviewed  CBC WITH DIFFERENTIAL/PLATELET - Abnormal; Notable for the following:    Neutro Abs 6.6 (*)    All other components within normal limits  BASIC METABOLIC PANEL - Abnormal; Notable for the following:    Glucose, Bld 117 (*)    Calcium 8.4 (*)    All other components within normal limits   ____________________________________________  RADIOLOGY  IMPRESSION: Mildly prominent tonsils bilaterally could reflect  tonsillitis. No evidence for tonsillar abscess.   Electronically Signed By: Rolm Baptise M.D. On: 07/07/2014 21:51 ____________________________________________   PROCEDURES  Procedure(s) performed: None  Critical Care performed: No  ____________________________________________   INITIAL IMPRESSION / ASSESSMENT AND PLAN / ED COURSE  Pertinent labs & imaging results that were available during my care of the patient were reviewed by me and considered in my medical decision making (see chart for details).   impression on this patient exudative tonsillitis try to give the patient oral antibiotics she said it burned too bad cough tobacco up and threw up going to go ahead and treat her with Bicillin LA she received dexamethasone while in the department and recommend that she do salt water gargles continue fluids at home if symptoms persist follow-up with ear nose and throat or return here for any acute concerns or worsening symptoms ____________________________________________   FINAL CLINICAL IMPRESSION(S) / ED DIAGNOSES  Final diagnoses:  Tonsillitis with exudate     Brynlei Klausner Verdene Rio, PA-C 07/07/14 2218  Earleen Newport, MD 07/07/14 2233

## 2014-12-25 DIAGNOSIS — K219 Gastro-esophageal reflux disease without esophagitis: Secondary | ICD-10-CM | POA: Insufficient documentation

## 2015-01-04 ENCOUNTER — Encounter: Payer: Self-pay | Admitting: *Deleted

## 2015-01-04 ENCOUNTER — Emergency Department
Admission: EM | Admit: 2015-01-04 | Discharge: 2015-01-04 | Disposition: A | Discharge status: Home / Self Care | Payer: BC Managed Care – PPO | Attending: Emergency Medicine | Admitting: Emergency Medicine

## 2015-01-04 DIAGNOSIS — R109 Unspecified abdominal pain: Secondary | ICD-10-CM | POA: Insufficient documentation

## 2015-01-04 DIAGNOSIS — Z91040 Latex allergy status: Secondary | ICD-10-CM | POA: Diagnosis not present

## 2015-01-04 DIAGNOSIS — R197 Diarrhea, unspecified: Secondary | ICD-10-CM | POA: Insufficient documentation

## 2015-01-04 DIAGNOSIS — R112 Nausea with vomiting, unspecified: Secondary | ICD-10-CM | POA: Diagnosis not present

## 2015-01-04 LAB — CBC
HEMATOCRIT: 42 % (ref 35.0–47.0)
HEMOGLOBIN: 13.6 g/dL (ref 12.0–16.0)
MCH: 28 pg (ref 26.0–34.0)
MCHC: 32.4 g/dL (ref 32.0–36.0)
MCV: 86.4 fL (ref 80.0–100.0)
Platelets: 312 10*3/uL (ref 150–440)
RBC: 4.86 MIL/uL (ref 3.80–5.20)
RDW: 14.4 % (ref 11.5–14.5)
WBC: 6.9 10*3/uL (ref 3.6–11.0)

## 2015-01-04 LAB — COMPREHENSIVE METABOLIC PANEL
ALBUMIN: 3.8 g/dL (ref 3.5–5.0)
ALT: 17 U/L (ref 14–54)
AST: 18 U/L (ref 15–41)
Alkaline Phosphatase: 89 U/L (ref 38–126)
Anion gap: 7 (ref 5–15)
BUN: 10 mg/dL (ref 6–20)
CO2: 21 mmol/L — ABNORMAL LOW (ref 22–32)
Calcium: 8.6 mg/dL — ABNORMAL LOW (ref 8.9–10.3)
Chloride: 107 mmol/L (ref 101–111)
Creatinine, Ser: 0.75 mg/dL (ref 0.44–1.00)
GFR calc Af Amer: >60 mL/min (ref >60)
Glucose, Bld: 99 mg/dL (ref 65–99)
Potassium: 3.8 mmol/L (ref 3.5–5.1)
Sodium: 135 mmol/L (ref 135–145)
Total Bilirubin: 0.8 mg/dL (ref 0.3–1.2)
Total Protein: 8 g/dL (ref 6.5–8.1)

## 2015-01-04 LAB — LIPASE, BLOOD: LIPASE: 23 U/L (ref 11–51)

## 2015-01-04 MED ORDER — ONDANSETRON HCL 4 MG PO TABS
4.0000 mg | ORAL_TABLET | Freq: Once | ORAL | Status: AC
  Administered 2015-01-04: 4 mg via ORAL
  Filled 2015-01-04: qty 1

## 2015-01-04 MED ORDER — DICYCLOMINE HCL 20 MG PO TABS: 20.0000 mg | ORAL_TABLET | Freq: Three times a day (TID) | ORAL | Status: DC | PRN

## 2015-01-04 MED ORDER — ONDANSETRON HCL 4 MG PO TABS: 4.0000 mg | ORAL_TABLET | Freq: Every day | ORAL | Status: DC | PRN

## 2015-01-04 MED ORDER — SODIUM CHLORIDE 0.9 % IV BOLUS (SEPSIS)
1000.0000 mL | Freq: Once | INTRAVENOUS | Status: AC
  Administered 2015-01-04: 1000 mL via INTRAVENOUS

## 2015-01-04 MED ORDER — DICYCLOMINE HCL 10 MG/ML IM SOLN
20.0000 mg | Freq: Once | INTRAMUSCULAR | Status: AC
  Administered 2015-01-04: 20 mg via INTRAMUSCULAR
  Filled 2015-01-04: qty 2

## 2015-01-04 MED ORDER — DIPHENHYDRAMINE HCL 25 MG PO CAPS
25.0000 mg | ORAL_CAPSULE | Freq: Once | ORAL | Status: AC
  Administered 2015-01-04: 25 mg via ORAL
  Filled 2015-01-04: qty 1

## 2015-01-04 NOTE — Discharge Instructions (Signed)
Please seek medical attention for any high fevers, chest pain, shortness of breath, change in behavior, persistent vomiting, bloody stool or any other new or concerning symptoms.  Viral Gastroenteritis Viral gastroenteritis is also known as stomach flu. This condition affects the stomach and intestinal tract. It can cause sudden diarrhea and vomiting. The illness typically lasts 3 to 8 days. Most people develop an immune response that eventually gets rid of the virus. While this natural response develops, the virus can make you quite ill. CAUSES  Many different viruses can cause gastroenteritis, such as rotavirus or noroviruses. You can catch one of these viruses by consuming contaminated food or water. You may also catch a virus by sharing utensils or other personal items with an infected person or by touching a contaminated surface. SYMPTOMS  The most common symptoms are diarrhea and vomiting. These problems can cause a severe loss of body fluids (dehydration) and a body salt (electrolyte) imbalance. Other symptoms may include:  Fever.  Headache.  Fatigue.  Abdominal pain. DIAGNOSIS  Your caregiver can usually diagnose viral gastroenteritis based on your symptoms and a physical exam. A stool sample may also be taken to test for the presence of viruses or other infections. TREATMENT  This illness typically goes away on its own. Treatments are aimed at rehydration. The most serious cases of viral gastroenteritis involve vomiting so severely that you are not able to keep fluids down. In these cases, fluids must be given through an intravenous line (IV). HOME CARE INSTRUCTIONS   Drink enough fluids to keep your urine clear or pale yellow. Drink small amounts of fluids frequently and increase the amounts as tolerated.  Ask your caregiver for specific rehydration instructions.  Avoid:  Foods high in sugar.  Alcohol.  Carbonated drinks.  Tobacco.  Juice.  Caffeine  drinks.  Extremely hot or cold fluids.  Fatty, greasy foods.  Too much intake of anything at one time.  Dairy products until 24 to 48 hours after diarrhea stops.  You may consume probiotics. Probiotics are active cultures of beneficial bacteria. They may lessen the amount and number of diarrheal stools in adults. Probiotics can be found in yogurt with active cultures and in supplements.  Wash your hands well to avoid spreading the virus.  Only take over-the-counter or prescription medicines for pain, discomfort, or fever as directed by your caregiver. Do not give aspirin to children. Antidiarrheal medicines are not recommended.  Ask your caregiver if you should continue to take your regular prescribed and over-the-counter medicines.  Keep all follow-up appointments as directed by your caregiver. SEEK IMMEDIATE MEDICAL CARE IF:   You are unable to keep fluids down.  You do not urinate at least once every 6 to 8 hours.  You develop shortness of breath.  You notice blood in your stool or vomit. This may look like coffee grounds.  You have abdominal pain that increases or is concentrated in one small area (localized).  You have persistent vomiting or diarrhea.  You have a fever.  The patient is a child younger than 3 months, and he or she has a fever.  The patient is a child older than 3 months, and he or she has a fever and persistent symptoms.  The patient is a child older than 3 months, and he or she has a fever and symptoms suddenly get worse.  The patient is a baby, and he or she has no tears when crying. MAKE SURE YOU:   Understand these instructions.  Will watch your condition.  Will get help right away if you are not doing well or get worse.   This information is not intended to replace advice given to you by your health care provider. Make sure you discuss any questions you have with your health care provider.   Document Released: 01/03/2005 Document Revised:  03/28/2011 Document Reviewed: 10/20/2010 Elsevier Interactive Patient Education [employer].

## 2015-01-04 NOTE — ED Notes (Signed)
Patient states she has vomited twice and had approximately 15 liquid stools today. Patient state she has had poor po intake today. Patient she had bilateral leg pains today.

## 2015-01-04 NOTE — ED Provider Notes (Signed)
[facility] Emergency Department Provider Note    ____________________________________________  Time seen: 2240  I have reviewed the triage vital signs and the nursing notes.   HISTORY  Chief Complaint Diarrhea   History limited by: Not Limited   HPI Debbie Sosa is a 39 y.o. female who presents to the emergency department today because of concerns for diarrhea and vomiting. The patient states that she has had 2 episodes of vomiting today. She has had multiple episodes of diarrhea. She says it is loose and watery. She states that when she tried to drink she felt she cannot keep any fluids down. She has had some abdominal cramping with this. It has been somewhat constant. She denies any recent antibiotic use. Denies any recent travel. Denies any untreated water or raw meat or seafood. She is followed by GI doctors over at [facility] for what sounds to be like achalasia.  Past Medical History  Diagnosis Date  . Asthma   . Pulmonary embolism [facility])     Patient Active Problem List   Diagnosis Date Noted  . Pulmonary embolism [facility])     Past Surgical History  Procedure Laterality Date  . Cesarean section    . Cholecystectomy    . Knee surgery    . Plantar fascia surgery      x 3  . Breast reduction surgery    . Cesarean section      Current Outpatient Rx  Name  Route  Sig  Dispense  Refill  . ketorolac (TORADOL) 10 MG tablet   Oral   Take 1 tablet (10 mg total) by mouth every 8 (eight) hours as needed.   20 tablet   0     Allergies Latex; Morphine and related; Pyridium; Sulfa antibiotics; Tramadol; and Vioxx  No family history on file.  Social History Social History  Substance Use Topics  . Smoking status: Never Smoker   . Smokeless tobacco: None  . Alcohol Use: No    Review of Systems  Constitutional: Negative for fever. Cardiovascular: Negative for chest pain. Respiratory: Negative for shortness of breath. Gastrointestinal:  Positive for abdominal pain, vomiting and diarrhea Neurological: Negative for headaches, focal weakness or numbness.  10-point ROS otherwise negative.  ____________________________________________   PHYSICAL EXAM:  VITAL SIGNS: ED Triage Vitals  Enc Vitals Group     BP 01/04/15 2049 111/81 mmHg     Pulse Rate 01/04/15 2049 112     Resp 01/04/15 2049 18     Temp 01/04/15 2049 97.9 F (36.6 C)     Temp Source 01/04/15 2049 Oral     SpO2 01/04/15 2049 99 %     Weight 01/04/15 2049 218 lb (98.884 kg)     Height 01/04/15 2049 5\' 2"  (1.575 m)     Head Cir --      Peak Flow --      Pain Score 01/04/15 2051 9   Constitutional: Alert and oriented. Well appearing and in no distress. Eyes: Conjunctivae are normal. PERRL. Normal extraocular movements. ENT   Head: Normocephalic and atraumatic.   Nose: No congestion/rhinnorhea.   Mouth/Throat: Mucous membranes are moist.   Neck: No stridor. Hematological/Lymphatic/Immunilogical: No cervical lymphadenopathy. Cardiovascular: Normal rate, regular rhythm.  No murmurs, rubs, or gallops. Respiratory: Normal respiratory effort without tachypnea nor retractions. Breath sounds are clear and equal bilaterally. No wheezes/rales/rhonchi. Gastrointestinal: Soft and minimally tender to palpation diffusely. No rebound. No gaurding. No distention. Genitourinary: Deferred Musculoskeletal: Normal range of motion in  all extremities. No joint effusions.  No lower extremity tenderness nor edema. Neurologic:  Normal speech and language. No gross focal neurologic deficits are appreciated.  Skin:  Skin is warm, dry and intact. No rash noted. Psychiatric: Mood and affect are normal. Speech and behavior are normal. Patient exhibits appropriate insight and judgment.  ____________________________________________    LABS (pertinent positives/negatives)  Labs Reviewed  COMPREHENSIVE METABOLIC PANEL - Abnormal; Notable for the following:    CO2 21  (*)    Calcium 8.6 (*)    All other components within normal limits  LIPASE, BLOOD  CBC  URINALYSIS COMPLETEWITH MICROSCOPIC ([facility] ONLY)  POC URINE PREG, ED     ____________________________________________   EKG  None  ____________________________________________    RADIOLOGY  None   ____________________________________________   PROCEDURES  Procedure(s) performed: None  Critical Care performed: No  ____________________________________________   INITIAL IMPRESSION / ASSESSMENT AND PLAN / ED COURSE  Pertinent labs & imaging results that were available during my care of the patient were reviewed by me and considered in my medical decision making (see chart for details).  Patient presented to the emergency department today because of concerns for vomiting diarrhea and abdominal pain. Blood work here without any concerning findings. Physical exam is just mild tenderness diffusely. Think likely patient has gastroenteritis. This point no concerning findings for appendicitis, other intra-abdominal catastrophe. The patient is safe for discharge. Will have patient follow-up with her GI doctors at [facility].  ____________________________________________   FINAL CLINICAL IMPRESSION(S) / ED DIAGNOSES  Final diagnoses:  Diarrhea, unspecified type  Nausea and vomiting, vomiting of unspecified type     Nance Pear, MD 01/04/15 2327

## 2015-01-07 DIAGNOSIS — K220 Achalasia of cardia: Secondary | ICD-10-CM | POA: Insufficient documentation

## 2015-01-07 DIAGNOSIS — E669 Obesity, unspecified: Secondary | ICD-10-CM | POA: Insufficient documentation

## 2015-01-07 DIAGNOSIS — G4731 Primary central sleep apnea: Secondary | ICD-10-CM | POA: Insufficient documentation

## 2015-04-03 ENCOUNTER — Other Ambulatory Visit: Payer: Self-pay | Admitting: Obstetrics and Gynecology

## 2015-04-03 DIAGNOSIS — Z1231 Encounter for screening mammogram for malignant neoplasm of breast: Secondary | ICD-10-CM

## 2015-04-27 ENCOUNTER — Ambulatory Visit
Admission: RE | Admit: 2015-04-27 | Discharge: 2015-04-27 | Disposition: A | Discharge status: Home / Self Care | Payer: BC Managed Care – PPO | Source: Ambulatory Visit | Attending: Obstetrics and Gynecology | Admitting: Obstetrics and Gynecology

## 2015-04-27 DIAGNOSIS — Z1231 Encounter for screening mammogram for malignant neoplasm of breast: Secondary | ICD-10-CM | POA: Insufficient documentation

## 2015-05-23 ENCOUNTER — Encounter: Payer: Self-pay | Admitting: Emergency Medicine

## 2015-05-23 ENCOUNTER — Emergency Department
Admission: EM | Admit: 2015-05-23 | Discharge: 2015-05-23 | Disposition: A | Discharge status: Home / Self Care | Payer: BC Managed Care – PPO | Attending: Emergency Medicine | Admitting: Emergency Medicine

## 2015-05-23 ENCOUNTER — Emergency Department: Payer: BC Managed Care – PPO

## 2015-05-23 DIAGNOSIS — S92514A Nondisplaced fracture of proximal phalanx of right lesser toe(s), initial encounter for closed fracture: Secondary | ICD-10-CM | POA: Diagnosis not present

## 2015-05-23 DIAGNOSIS — S92911A Unspecified fracture of right toe(s), initial encounter for closed fracture: Secondary | ICD-10-CM

## 2015-05-23 DIAGNOSIS — M79671 Pain in right foot: Secondary | ICD-10-CM | POA: Diagnosis present

## 2015-05-23 DIAGNOSIS — J45909 Unspecified asthma, uncomplicated: Secondary | ICD-10-CM | POA: Diagnosis not present

## 2015-05-23 DIAGNOSIS — W010XXA Fall on same level from slipping, tripping and stumbling without subsequent striking against object, initial encounter: Secondary | ICD-10-CM | POA: Diagnosis not present

## 2015-05-23 DIAGNOSIS — Y939 Activity, unspecified: Secondary | ICD-10-CM | POA: Insufficient documentation

## 2015-05-23 DIAGNOSIS — Y929 Unspecified place or not applicable: Secondary | ICD-10-CM | POA: Diagnosis not present

## 2015-05-23 DIAGNOSIS — Y999 Unspecified external cause status: Secondary | ICD-10-CM | POA: Diagnosis not present

## 2015-05-23 MED ORDER — OXYCODONE-ACETAMINOPHEN 5-325 MG PO TABS
2.0000 | ORAL_TABLET | Freq: Once | ORAL | Status: AC
  Administered 2015-05-23: 2 via ORAL
  Filled 2015-05-23: qty 2

## 2015-05-23 MED ORDER — OXYCODONE-ACETAMINOPHEN 5-325 MG PO TABS: 2.0000 | ORAL_TABLET | Freq: Four times a day (QID) | ORAL | Status: DC | PRN

## 2015-05-23 NOTE — Discharge Instructions (Signed)
Toe Fracture °A toe fracture is a break in one of the toe bones (phalanges). °CAUSES °This condition may be caused by: °· Dropping a heavy object on your toe. °· Stubbing your toe. °· Overusing your toe or doing repetitive exercise. °· Twisting or stretching your toe out of place. °RISK FACTORS °This condition is more likely to develop in people who: °· Play contact sports. °· Have a bone disease. °· Have a low calcium level. °SYMPTOMS °The main symptoms of this condition are swelling and pain in the toe. The pain may get worse with standing or walking. Other symptoms include: °· Bruising. °· Stiffness. °· Numbness. °· A change in the way the toe looks. °· Broken bones that poke through the skin. °· Blood beneath the toenail. °DIAGNOSIS °This condition is diagnosed with a physical exam. You may also have X-rays. °TREATMENT  °Treatment for this condition depends on the type of fracture and its severity. Treatment may involve: °· Taping the broken toe to a toe that is next to it (buddy taping). This is the most common treatment for fractures in which the bone has not moved out of place (nondisplaced fracture). °· Wearing a shoe that has a wide, rigid sole to protect the toe and to limit its movement. °· Wearing a walking cast. °· Having a procedure to move the toe back into place. °· Surgery. This may be needed: °¨ If there are many pieces of broken bone that are out of place (displaced). °¨ If the toe joint breaks. °¨ If the bone breaks through the skin. °· Physical therapy. This is done to help regain movement and strength in the toe. °You may need follow-up X-rays to make sure that the bone is healing well and staying in position. °HOME CARE INSTRUCTIONS °If You Have a Cast: °· Do not stick anything inside the cast to scratch your skin. Doing that increases your risk of infection. °· Check the skin around the cast every day. Report any concerns to your health care provider. You may put lotion on dry skin around the  edges of the cast. Do not apply lotion to the skin underneath the cast. °· Do not put pressure on any part of the cast until it is fully hardened. This may take several hours. °· Keep the cast clean and dry. °Bathing °· Do not take baths, swim, or use a hot tub until your health care provider approves. Ask your health care provider if you can take showers. You may only be allowed to take sponge baths for bathing. °· If your health care provider approves bathing and showering, cover the cast or bandage (dressing) with a watertight plastic bag to protect it from water. Do not let the cast or dressing get wet. °Managing Pain, Stiffness, and Swelling °· If you do not have a cast, apply ice to the injured area, if directed. °¨ Put ice in a plastic bag. °¨ Place a towel between your skin and the bag. °¨ Leave the ice on for 20 minutes, 2-3 times per day. °· Move your toes often to avoid stiffness and to lessen swelling. °· Raise (elevate) the injured area above the level of your heart while you are sitting or lying down. °Driving °· Do not drive or operate heavy machinery while taking pain medicine. °· Do not drive while wearing a cast on a foot that you use for driving. °Activity °· Return to your normal activities as directed by your health care provider. Ask your health care   provider what activities are safe for you. °· Perform exercises daily as directed by your health care provider or physical therapist. °Safety °· Do not use the injured limb to support your body weight until your health care provider says that you can. Use crutches or other assistive devices as directed by your health care provider. °General Instructions °· If your toe was treated with buddy taping, follow your health care provider's instructions for changing the gauze and tape. Change it more often: °¨ The gauze and tape get wet. If this happens, dry the space between the toes. °¨ The gauze and tape are too tight and cause your toe to become pale  or numb. °· Wear a protective shoe as directed by your health care provider. If you were not given a protective shoe, wear sturdy, supportive shoes. Your shoes should not pinch your toes and should not fit tightly against your toes. °· Do not use any tobacco products, including cigarettes, chewing tobacco, or e-cigarettes. Tobacco can delay bone healing. If you need help quitting, ask your health care provider. °· Take medicines only as directed by your health care provider. °· Keep all follow-up visits as directed by your health care provider. This is important. °SEEK MEDICAL CARE IF: °· You have a fever. °· Your pain medicine is not helping. °· Your toe is cold. °· Your toe is numb. °· You still have pain after one week of rest and treatment. °· You still have pain after your health care provider has said that you can start walking again. °· You have pain, tingling, or numbness in your foot that is not going away. °SEEK IMMEDIATE MEDICAL CARE IF: °· You have severe pain. °· You have redness or inflammation in your toe that is getting worse. °· You have pain or numbness in your toe that is getting worse. °· Your toe turns blue. °  °This information is not intended to replace advice given to you by your health care provider. Make sure you discuss any questions you have with your health care provider. °  °Document Released: 01/01/2000 Document Revised: 09/24/2014 Document Reviewed: 10/30/2013 °Elsevier Interactive Patient Education ©2016 Elsevier Inc[employer] ° °

## 2015-05-23 NOTE — ED Notes (Signed)
R foot pain since twisted and fell approx 4p today.

## 2015-05-23 NOTE — ED Provider Notes (Signed)
[facility] Emergency Department Provider Note        Time seen: ----------------------------------------- 7:30 PM on 05/23/2015 -----------------------------------------    I have reviewed the triage vital signs and the nursing notes.   HISTORY  Chief Complaint Foot Pain    HPI Debbie Sosa is a 40 y.o. female who presents to the ER for right foot pain since she twisted and fell approximate 4 PM today. Patient states she tripped on a speaker wire and landed on the right foot. She has pain with ambulation but denies other complaints.   Past Medical History  Diagnosis Date  . Asthma   . Pulmonary embolism [facility])     Patient Active Problem List   Diagnosis Date Noted  . Pulmonary embolism [facility])     Past Surgical History  Procedure Laterality Date  . Cesarean section    . Cholecystectomy    . Knee surgery    . Plantar fascia surgery      x 3  . Breast reduction surgery    . Cesarean section    . Breast biopsy Left 2006    benign  . Breast biopsy Right 2015    benign  . Reduction mammaplasty Bilateral 2005  . Gastric bypass      01/2015    Allergies Latex; Morphine and related; Pyridium; Sulfa antibiotics; Tramadol; and Vioxx  Social History Social History  Substance Use Topics  . Smoking status: Never Smoker   . Smokeless tobacco: None  . Alcohol Use: No    Review of Systems Constitutional: Negative for fever. Musculoskeletal: Positive for right foot and specifically right fifth toe pain Skin: Negative for rash. Neurological: Negative for headaches, focal weakness or numbness.  ____________________________________________   PHYSICAL EXAM:  VITAL SIGNS: ED Triage Vitals  Enc Vitals Group     BP 05/23/15 1841 115/86 mmHg     Pulse Rate 05/23/15 1841 73     Resp 05/23/15 1841 18     Temp 05/23/15 1841 98 F (36.7 C)     Temp Source 05/23/15 1841 Oral     SpO2 05/23/15 1841 100 %     Weight 05/23/15 1841 180 lb  (81.647 kg)     Height 05/23/15 1841 5\' 2"  (1.575 m)     Head Cir --      Peak Flow --      Pain Score 05/23/15 1842 10     Pain Loc --      Pain Edu? --      Excl. in [place]? --     Constitutional: Alert and oriented. Well appearing and in no distress. Eyes: Conjunctivae are normal. PERRL. Normal extraocular movements. Musculoskeletal: Right fifth toe tenderness, no visible deformities are noted. Good pulses in the right foot Neurologic:  Normal speech and language. No gross focal neurologic deficits are appreciated.  Skin:  Skin is warm, dry and intact. No rash noted. Psychiatric: Mood and affect are normal. Speech and behavior are normal.  ____________________________________________  ED COURSE:  Pertinent labs & imaging results that were available during my care of the patient were reviewed by me and considered in my medical decision making (see chart for details). Patient presents in no acute distress, right foot injury. We will obtain x-rays and reevaluate. ____________________________________________    RADIOLOGY Images were viewed by me  IMPRESSION: Nondisplaced fractures of the little toe proximal phalanx and little toe middle phalanx.  ____________________________________________  FINAL ASSESSMENT AND PLAN  Toe fracture  Plan: Patient  with imaging as dictated above. Patient is are wearing a postoperative shoe. I will advise buddy taping and pain medication. She can refer to orthopedics as desired.   Earleen Newport, MD   Note: This dictation was prepared with Dragon dictation. Any transcriptional errors that result from this process are unintentional   Earleen Newport, MD 05/23/15 [phone]

## 2015-05-23 NOTE — ED Notes (Signed)
Pt discharged to home.  Family member driving.  Discharge instructions reviewed.  Verbalized understanding.  No questions or concerns at this time.  Teach back verified.  Pt in NAD.  No items left in ED.   

## 2015-07-23 DIAGNOSIS — K912 Postsurgical malabsorption, not elsewhere classified: Secondary | ICD-10-CM | POA: Insufficient documentation

## 2015-07-23 DIAGNOSIS — Z9884 Bariatric surgery status: Secondary | ICD-10-CM | POA: Insufficient documentation

## 2015-08-07 ENCOUNTER — Encounter: Payer: Self-pay | Admitting: Emergency Medicine

## 2015-08-07 ENCOUNTER — Emergency Department
Admission: EM | Admit: 2015-08-07 | Discharge: 2015-08-07 | Disposition: A | Discharge status: Home / Self Care | Payer: BC Managed Care – PPO | Attending: Emergency Medicine | Admitting: Emergency Medicine

## 2015-08-07 DIAGNOSIS — N390 Urinary tract infection, site not specified: Secondary | ICD-10-CM | POA: Insufficient documentation

## 2015-08-07 DIAGNOSIS — J45909 Unspecified asthma, uncomplicated: Secondary | ICD-10-CM | POA: Diagnosis not present

## 2015-08-07 DIAGNOSIS — R1032 Left lower quadrant pain: Secondary | ICD-10-CM | POA: Diagnosis present

## 2015-08-07 DIAGNOSIS — Z86711 Personal history of pulmonary embolism: Secondary | ICD-10-CM | POA: Diagnosis not present

## 2015-08-07 DIAGNOSIS — R109 Unspecified abdominal pain: Secondary | ICD-10-CM

## 2015-08-07 LAB — URINALYSIS COMPLETE WITH MICROSCOPIC (ARMC ONLY)
Bilirubin Urine: NEGATIVE
GLUCOSE, UA: NEGATIVE mg/dL
Ketones, ur: NEGATIVE mg/dL
Nitrite: NEGATIVE
PH: 5 (ref 5.0–8.0)
PROTEIN: 100 mg/dL — AB
SPECIFIC GRAVITY, URINE: 1.026 (ref 1.005–1.030)

## 2015-08-07 LAB — COMPREHENSIVE METABOLIC PANEL
ALBUMIN: 3.8 g/dL (ref 3.5–5.0)
ALT: 15 U/L (ref 14–54)
ANION GAP: 7 (ref 5–15)
AST: 20 U/L (ref 15–41)
Alkaline Phosphatase: 104 U/L (ref 38–126)
BUN: 12 mg/dL (ref 6–20)
CHLORIDE: 110 mmol/L (ref 101–111)
CO2: 19 mmol/L — ABNORMAL LOW (ref 22–32)
Calcium: 8.7 mg/dL — ABNORMAL LOW (ref 8.9–10.3)
Creatinine, Ser: 0.73 mg/dL (ref 0.44–1.00)
GFR calc Af Amer: >60 mL/min (ref >60)
Glucose, Bld: 112 mg/dL — ABNORMAL HIGH (ref 65–99)
POTASSIUM: 3.6 mmol/L (ref 3.5–5.1)
Sodium: 136 mmol/L (ref 135–145)
Total Bilirubin: 0.4 mg/dL (ref 0.3–1.2)
Total Protein: 7.5 g/dL (ref 6.5–8.1)

## 2015-08-07 LAB — CBC
HEMATOCRIT: 38.9 % (ref 35.0–47.0)
HEMOGLOBIN: 13.3 g/dL (ref 12.0–16.0)
MCH: 30.1 pg (ref 26.0–34.0)
MCHC: 34.3 g/dL (ref 32.0–36.0)
MCV: 87.8 fL (ref 80.0–100.0)
Platelets: 270 10*3/uL (ref 150–440)
RBC: 4.43 MIL/uL (ref 3.80–5.20)
RDW: 14.1 % (ref 11.5–14.5)
WBC: 7.4 10*3/uL (ref 3.6–11.0)

## 2015-08-07 LAB — LIPASE, BLOOD: LIPASE: 23 U/L (ref 11–51)

## 2015-08-07 MED ORDER — CEPHALEXIN 500 MG PO CAPS: 500.0000 mg | ORAL_CAPSULE | Freq: Three times a day (TID) | ORAL | Status: DC

## 2015-08-07 MED ORDER — DICYCLOMINE HCL 20 MG PO TABS: 20.0000 mg | ORAL_TABLET | Freq: Three times a day (TID) | ORAL | Status: DC | PRN

## 2015-08-07 NOTE — ED Provider Notes (Signed)
[facility] Emergency Department Provider Note    ____________________________________________  Time seen: ~1755  I have reviewed the triage vital signs and the nursing notes.   HISTORY  Chief Complaint Abdominal Pain and Dysuria   History limited by: Not Limited   HPI Debbie Sosa is a 40 y.o. female who presents to the emergency department today with primary complaints of abdominal pain. She states that the pain that brings her in today is a pain located in the lower abdomen and left lower quadrant. Has been there about a week. She has noticed some dysuria. The patient has unfortunately chronic abdominal pain secondary to a gastric bypass surgery last year. She states that pain is higher in the stomach and has been consistent since then. The pain she comes in for today is different. She denies any fevers.    Past Medical History  Diagnosis Date  . Asthma   . Pulmonary embolism [facility])     Patient Active Problem List   Diagnosis Date Noted  . Pulmonary embolism [facility]     Past Surgical History  Procedure Laterality Date  . Cesarean section    . Cholecystectomy    . Knee surgery    . Plantar fascia surgery      x 3  . Breast reduction surgery    . Cesarean section    . Breast biopsy Left 2006    benign  . Breast biopsy Right 2015    benign  . Reduction mammaplasty Bilateral 2005  . Gastric bypass      01/2015    Current Outpatient Rx  Name  Route  Sig  Dispense  Refill  . dicyclomine (BENTYL) 20 MG tablet   Oral   Take 1 tablet (20 mg total) by mouth 3 (three) times daily as needed for spasms.   30 tablet   0   . ketorolac (TORADOL) 10 MG tablet   Oral   Take 1 tablet (10 mg total) by mouth every 8 (eight) hours as needed.   20 tablet   0   . ondansetron (ZOFRAN) 4 MG tablet   Oral   Take 1 tablet (4 mg total) by mouth daily as needed for nausea or vomiting.   20 tablet   0   . oxyCODONE-acetaminophen (PERCOCET) 5-325  MG tablet   Oral   Take 2 tablets by mouth every 6 (six) hours as needed for moderate pain or severe pain.   30 tablet   0     Allergies Latex; Morphine and related; Pyridium; Sulfa antibiotics; Tramadol; and Vioxx  Family History  Problem Relation Age of Onset  . Breast cancer Mother 87  . Breast cancer Cousin 54    2 maternal cousins     Social History Social History  Substance Use Topics  . Smoking status: Never Smoker   . Smokeless tobacco: None  . Alcohol Use: No    Review of Systems  Constitutional: Negative for fever. Cardiovascular: Negative for chest pain. Respiratory: Negative for shortness of breath. Gastrointestinal: Positive for suprapubic pain Genitourinary: Positive for dysuria Neurological: Negative for headaches, focal weakness or numbness.  10-point ROS otherwise negative.  ____________________________________________   PHYSICAL EXAM:  VITAL SIGNS: ED Triage Vitals  Enc Vitals Group     BP 08/07/15 1515 126/88 mmHg     Pulse Rate 08/07/15 1515 82     Resp 08/07/15 1515 18     Temp 08/07/15 1515 98.1 F (36.7 C)  Temp Source 08/07/15 1515 Oral     SpO2 08/07/15 1515 100 %     Weight 08/07/15 1515 177 lb (80.287 kg)     Height 08/07/15 1515 5\' 2"  (1.575 m)     Head Cir --      Peak Flow --    Constitutional: Alert and oriented. Well appearing and in no distress. Eyes: Conjunctivae are normal. PERRL. Normal extraocular movements. ENT   Head: Normocephalic and atraumatic.   Nose: No congestion/rhinnorhea.   Mouth/Throat: Mucous membranes are moist.   Neck: No stridor. Hematological/Lymphatic/Immunilogical: No cervical lymphadenopathy. Cardiovascular: Normal rate, regular rhythm.  No murmurs, rubs, or gallops. Respiratory: Normal respiratory effort without tachypnea nor retractions. Breath sounds are clear and equal bilaterally. No wheezes/rales/rhonchi. Gastrointestinal: Soft and minimally tender in the upper abdomen,  this is what the patient states is her chronic pain. Additionally she does have some tenderness in the suprapubic and left lower quadrant. No rebound. No guarding. No CVA tenderness Genitourinary: Deferred Musculoskeletal: Normal range of motion in all extremities. No joint effusions.  No lower extremity tenderness nor edema. Neurologic:  Normal speech and language. No gross focal neurologic deficits are appreciated.  Skin:  Skin is warm, dry and intact. No rash noted. Psychiatric: Mood and affect are normal. Speech and behavior are normal. Patient exhibits appropriate insight and judgment.  ____________________________________________    LABS (pertinent positives/negatives)  Labs Reviewed  COMPREHENSIVE METABOLIC PANEL - Abnormal; Notable for the following:    CO2 19 (*)    Glucose, Bld 112 (*)    Calcium 8.7 (*)    All other components within normal limits  URINALYSIS COMPLETEWITH MICROSCOPIC ([facility] ONLY) - Abnormal; Notable for the following:    Color, Urine YELLOW (*)    APPearance CLOUDY (*)    Hgb urine dipstick 2+ (*)    Protein, ur 100 (*)    Leukocytes, UA 3+ (*)    Bacteria, UA RARE (*)    Squamous Epithelial / LPF 6-30 (*)    All other components within normal limits  LIPASE, BLOOD  CBC  POC URINE PREG, ED    ____________________________________________   EKG  None  ____________________________________________    RADIOLOGY  None  ____________________________________________   PROCEDURES  Procedure(s) performed: None  Critical Care performed: No  ____________________________________________   INITIAL IMPRESSION / ASSESSMENT AND PLAN / ED COURSE  Pertinent labs & imaging results that were available during my care of the patient were reviewed by me and considered in my medical decision making (see chart for details).  Patient presented to the emergency department today because of concerns for new abdominal pain. Workup is concerning for urinary  tract infection. No leukocytosis or fever. No CVA tenderness to suggest pyelonephritis. Will place patient on antibiotics.  ____________________________________________   FINAL CLINICAL IMPRESSION(S) / ED DIAGNOSES  Final diagnoses:  Abdominal pain, unspecified abdominal location  UTI (lower urinary tract infection)     Note: This dictation was prepared with Dragon dictation. Any transcriptional errors that result from this process are unintentional    Nance Pear, MD 08/07/15 1910

## 2015-08-07 NOTE — Discharge Instructions (Signed)
Please seek medical attention for any high fevers, chest pain, shortness of breath, change in behavior, persistent vomiting, bloody stool or any other new or concerning symptoms.   Urinary Tract Infection A urinary tract infection (UTI) can occur any place along the urinary tract. The tract includes the kidneys, ureters, bladder, and urethra. A type of germ called bacteria often causes a UTI. UTIs are often helped with antibiotic medicine.  HOME CARE   If given, take antibiotics as told by your doctor. Finish them even if you start to feel better.  Drink enough fluids to keep your pee (urine) clear or pale yellow.  Avoid tea, drinks with caffeine, and bubbly (carbonated) drinks.  Pee often. Avoid holding your pee in for a long time.  Pee before and after having sex (intercourse).  Wipe from front to back after you poop (bowel movement) if you are a woman. Use each tissue only once. GET HELP RIGHT AWAY IF:   You have back pain.  You have lower belly (abdominal) pain.  You have chills.  You feel sick to your stomach (nauseous).  You throw up (vomit).  Your burning or discomfort with peeing does not go away.  You have a fever.  Your symptoms are not better in 3 days. MAKE SURE YOU:   Understand these instructions.  Will watch your condition.  Will get help right away if you are not doing well or get worse.   This information is not intended to replace advice given to you by your health care provider. Make sure you discuss any questions you have with your health care provider.   Document Released: 06/22/2007 Document Revised: 01/24/2014 Document Reviewed: 08/04/2011 Elsevier Interactive Patient Education [employer].

## 2015-08-07 NOTE — ED Notes (Signed)
Patient presents to the ED with abdominal pain x 1 week with loose stools, dysuria, and irregular vaginal bleeding.  Patient states she had gastric bypass surgery in January.  Patient denies vomiting. Patient reports lack of appetite.  Patient states she has a Mirena IUD and hasn't had any vaginal bleeding since it was placed until May when bleeding became intermittent, off and on.  But patient reports bleeding is never very heavy.

## 2015-08-10 LAB — URINE CULTURE: Culture: >=100000 — AB

## 2015-08-11 NOTE — Progress Notes (Signed)
Patient visited ED on 08/07/15 with a final diagnoses of UTI and abdominal pain. Urine culture results came back 07/24 and reported >100,000 colonies of E coli and 50,000 colonies GBS. Patient was discharged on cephalexin 500mg  po 1 tid x 10 days. No changes in therapy needed.  Darrow Bussing, PharmD Pharmacy Resident 08/11/2015 4:03 PM

## 2015-10-14 DIAGNOSIS — R112 Nausea with vomiting, unspecified: Secondary | ICD-10-CM | POA: Insufficient documentation

## 2015-10-14 DIAGNOSIS — R197 Diarrhea, unspecified: Secondary | ICD-10-CM | POA: Insufficient documentation

## 2015-10-14 DIAGNOSIS — R1032 Left lower quadrant pain: Secondary | ICD-10-CM | POA: Insufficient documentation

## 2015-10-14 HISTORY — DX: Nausea with vomiting, unspecified: R11.2

## 2015-11-25 ENCOUNTER — Encounter: Payer: Self-pay | Admitting: Emergency Medicine

## 2015-11-25 ENCOUNTER — Emergency Department: Payer: BC Managed Care – PPO

## 2015-11-25 ENCOUNTER — Emergency Department
Admission: EM | Admit: 2015-11-25 | Discharge: 2015-11-25 | Disposition: A | Discharge status: Home / Self Care | Payer: BC Managed Care – PPO | Attending: Emergency Medicine | Admitting: Emergency Medicine

## 2015-11-25 DIAGNOSIS — Y939 Activity, unspecified: Secondary | ICD-10-CM | POA: Diagnosis not present

## 2015-11-25 DIAGNOSIS — Y92009 Unspecified place in unspecified non-institutional (private) residence as the place of occurrence of the external cause: Secondary | ICD-10-CM | POA: Insufficient documentation

## 2015-11-25 DIAGNOSIS — J45909 Unspecified asthma, uncomplicated: Secondary | ICD-10-CM | POA: Diagnosis not present

## 2015-11-25 DIAGNOSIS — W1840XA Slipping, tripping and stumbling without falling, unspecified, initial encounter: Secondary | ICD-10-CM | POA: Diagnosis not present

## 2015-11-25 DIAGNOSIS — S99922A Unspecified injury of left foot, initial encounter: Secondary | ICD-10-CM | POA: Insufficient documentation

## 2015-11-25 DIAGNOSIS — Y999 Unspecified external cause status: Secondary | ICD-10-CM | POA: Insufficient documentation

## 2015-11-25 NOTE — Discharge Instructions (Signed)
Take tylenol as needed for pain. Ice and elevate foot for any swelling.

## 2015-11-25 NOTE — ED Triage Notes (Signed)
Pt comes into the ED via POV c/o left foot pain on the lateral side after tripping over a shoe.  Patient concerned it may be broken due to her low calcium levels.

## 2015-11-25 NOTE — ED Notes (Signed)
Pt brought to room from x-ray in wheelchair. Pt states she tripped over shoe at home, she believes it is L pinky toe that is injured. Pt states gastric bypass surgery and supposed to take calcium, but doesn't take it everyday. States has elevated it but not taken anything for pain. Pt alert and oriented, no distress noted.

## 2015-11-25 NOTE — ED Provider Notes (Signed)
[facility] Emergency Department Provider Note ____________________________________________  Time seen: Approximately 10:57 PM  I have reviewed the triage vital signs and the nursing notes.   HISTORY  Chief Complaint Foot Pain  HPI Debbie Sosa is a 40 y.o. female that presents with left foot pain after tripping over a shoe at 815pm tonight. Patient has not been able to bear weight on left foot since incident. No swelling or bruising. Patient elevated foot for about an hour before coming to ED. Patient has not taken anything for pain. Patient had gastric bypass surgery and does not regularly take her calcium supplements. Patient cannot take ibuprofen. Patient has broken many bones previously.   Past Medical History:  Diagnosis Date  . Asthma   . Pulmonary embolism [facility])     Patient Active Problem List   Diagnosis Date Noted  . Pulmonary embolism [facility])     Past Surgical History:  Procedure Laterality Date  . BREAST BIOPSY Left 2006   benign  . BREAST BIOPSY Right 2015   benign  . BREAST REDUCTION SURGERY    . CESAREAN SECTION    . CESAREAN SECTION    . CHOLECYSTECTOMY    . GASTRIC BYPASS     01/2015  . KNEE SURGERY    . PLANTAR FASCIA SURGERY     x 3  . REDUCTION MAMMAPLASTY Bilateral 2005    Prior to Admission medications   Not on File    Allergies Latex; Morphine and related; Pyridium [phenazopyridine hcl]; Sulfa antibiotics; Tramadol; and Vioxx [rofecoxib]  Family History  Problem Relation Age of Onset  . Breast cancer Mother 3  . Breast cancer Cousin 39    2 maternal cousins     Social History Social History  Substance Use Topics  . Smoking status: Never Smoker  . Smokeless tobacco: Never Used  . Alcohol use No    Review of Systems Constitutional: No recent illness. Cardiovascular: Denies chest pain or palpitations. Respiratory: Denies shortness of breath. Musculoskeletal: Pain in left foot. Skin: Negative for rash,  wound, lesion. Neurological: Negative for focal weakness or numbness.  ____________________________________________   PHYSICAL EXAM:  VITAL SIGNS: ED Triage Vitals [11/25/15 2234]  Enc Vitals Group     BP 101/83     Pulse Rate 92     Resp 16     Temp 97.9 F (36.6 C)     Temp Source Oral     SpO2 96 %     Weight 172 lb (78 kg)     Height 5\' 2"  (1.575 m)     Head Circumference      Peak Flow      Pain Score 9     Pain Loc      Pain Edu?      Excl. in [place]?     Constitutional: Alert and oriented. Well appearing and in no acute distress. Eyes: Conjunctivae are normal. EOMI. Head: Atraumatic. Neck: No stridor.  Respiratory: Normal respiratory effort.   Musculoskeletal: Tenderness to palpation over 4th and 5th metatarsal. Patient is unable to move digits on left foot. Full ROM of left ankle.  Neurologic:  Normal speech and language. No gross focal neurologic deficits are appreciated. Speech is normal. No gait instability. Skin:  Skin is warm, dry and intact. Atraumatic. No swelling or bruising of left foot.  Psychiatric: Mood and affect are normal. Speech and behavior are normal.  ____________________________________________   LABS (all labs ordered are listed, but only abnormal results are  displayed)  Labs Reviewed - No data to display ____________________________________________  RADIOLOGY  Robinette Haines, personally viewed and evaluated these images (plain radiographs) as part of my medical decision making, as well as reviewing the written report by the radiologist.   No acute bony abnormality or dislocation of left foot, per radiology.   INITIAL IMPRESSION / ASSESSMENT AND PLAN / ED COURSE  Clinical Course     Pertinent labs & imaging results that were available during my care of the patient were reviewed by me and considered in my medical decision making (see chart for details).  Assessment and plan   My assessment is that patient has a minor injury to  left foot. No acute bony abnormality or dislocation was seen on xray. Patient does not have any swelling or bruising. Patient should rest and use ice and compression for pain and swelling. Patient can take tylenol as needed for pain. Patient should follow up with podiatry if pain or symptoms persist.  ____________________________________________   FINAL CLINICAL IMPRESSION(S) / ED DIAGNOSES  Final diagnoses:  Injury of small toe, left, initial encounter      Laban Emperor, PA-C 11/25/15 2332    Lavonia Drafts, MD 11/28/15 [phone]

## 2015-12-01 ENCOUNTER — Emergency Department: Payer: BC Managed Care – PPO

## 2015-12-01 ENCOUNTER — Emergency Department
Admission: EM | Admit: 2015-12-01 | Discharge: 2015-12-01 | Disposition: A | Discharge status: Home / Self Care | Payer: BC Managed Care – PPO | Attending: Emergency Medicine | Admitting: Emergency Medicine

## 2015-12-01 DIAGNOSIS — S99922A Unspecified injury of left foot, initial encounter: Secondary | ICD-10-CM | POA: Diagnosis present

## 2015-12-01 DIAGNOSIS — Z91040 Latex allergy status: Secondary | ICD-10-CM | POA: Insufficient documentation

## 2015-12-01 DIAGNOSIS — W1849XA Other slipping, tripping and stumbling without falling, initial encounter: Secondary | ICD-10-CM | POA: Insufficient documentation

## 2015-12-01 DIAGNOSIS — M79675 Pain in left toe(s): Secondary | ICD-10-CM

## 2015-12-01 DIAGNOSIS — J45909 Unspecified asthma, uncomplicated: Secondary | ICD-10-CM | POA: Insufficient documentation

## 2015-12-01 DIAGNOSIS — M79672 Pain in left foot: Secondary | ICD-10-CM | POA: Diagnosis not present

## 2015-12-01 DIAGNOSIS — Y939 Activity, unspecified: Secondary | ICD-10-CM | POA: Insufficient documentation

## 2015-12-01 DIAGNOSIS — Y999 Unspecified external cause status: Secondary | ICD-10-CM | POA: Insufficient documentation

## 2015-12-01 DIAGNOSIS — Y929 Unspecified place or not applicable: Secondary | ICD-10-CM | POA: Insufficient documentation

## 2015-12-01 MED ORDER — DICLOFENAC SODIUM 3 % TD GEL: 1.0000 "application " | Freq: Two times a day (BID) | TRANSDERMAL | 0 refills | Status: DC | PRN

## 2015-12-01 NOTE — ED Notes (Signed)

## 2015-12-01 NOTE — ED Notes (Signed)
Pt was seen by this RN last week for fracture at base of pinky toe on L foot. Pt stated she was unable to get an appt with [facility] ortho as follow up. Pt states she has been icing and elevating foot. Wearing post-op shoe and walking on a crutch. States continued pain. States able to move/wiggle toes on L foot except pinky toe. Toe appears less swollen than when this RN saw pt last.   Pt had gastric bypass surgery recently. States she takes tylenol for pain but that is not helping her.

## 2015-12-01 NOTE — ED Triage Notes (Signed)
Patient ambulatory to triage with steady gait, without difficulty or distress noted; pt reports seen here recently for left foot pain after tripping over a shoe; c/o persistent pain

## 2015-12-01 NOTE — ED Provider Notes (Signed)
[facility] Emergency Department Provider Note  ____________________________________________   First MD Initiated Contact with Patient 12/01/15 2258     (approximate)  I have reviewed the triage vital signs and the nursing notes.   HISTORY  Chief Complaint Foot Pain   HPI Debbie Sosa is a 40 y.o. 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 She says that about one week ago she tripped over her son's shoe. She says that after tripping over the shoe she stubbed her left small toe and came to the emergency department for evaluation. She was found to not have an acute fracture at that time. She has been using an Ace wrap as well as a hard soled shoe. However, she is still having pain especially with ambulation. She is also been using extra strength Tylenol.   Past Medical History:  Diagnosis Date  . Asthma   . Pulmonary embolism [facility])     Patient Active Problem List   Diagnosis Date Noted  . Pulmonary embolism [facility])     Past Surgical History:  Procedure Laterality Date  . BREAST BIOPSY Left 2006   benign  . BREAST BIOPSY Right 2015   benign  . BREAST REDUCTION SURGERY    . CESAREAN SECTION    . CESAREAN SECTION    . CHOLECYSTECTOMY    . GASTRIC BYPASS     01/2015  . KNEE SURGERY    . PLANTAR FASCIA SURGERY     x 3  . REDUCTION MAMMAPLASTY Bilateral 2005    Prior to Admission medications   Not on File    Allergies Latex; Morphine and related; Pyridium [phenazopyridine hcl]; Sulfa antibiotics; Tramadol; and Vioxx [rofecoxib]  Family History  Problem Relation Age of Onset  . Breast cancer Mother 87  . Breast cancer Cousin 97    2 maternal cousins     Social History Social History  Substance Use Topics  . Smoking status: Never Smoker  . Smokeless tobacco: Never Used  . Alcohol use No    Review of  Systems Constitutional: No fever/chills Eyes: No visual changes. ENT: No sore throat. Cardiovascular: Denies chest pain. Respiratory: Denies shortness of breath. Gastrointestinal: No abdominal pain.  No nausea, no vomiting.  No diarrhea.  No constipation. Genitourinary: Negative for dysuria. Musculoskeletal: Negative for back pain. Skin: Negative for rash. Neurological: Negative for headaches, focal weakness or numbness.  10-point ROS otherwise negative.  ____________________________________________   PHYSICAL EXAM:  VITAL SIGNS: ED Triage Vitals  Enc Vitals Group     BP 12/01/15 2100 123/76     Pulse Rate 12/01/15 2100 93     Resp 12/01/15 2100 20     Temp 12/01/15 2100 98 F (36.7 C)     Temp Source 12/01/15 2100 Oral     SpO2 12/01/15 2100 99 %     Weight 12/01/15 2059 172 lb (78 kg)     Height 12/01/15 2059 5\' 2"  (1.575 m)     Head Circumference --      Peak Flow --      Pain Score 12/01/15 2059 9     Pain Loc --      Pain Edu? --      Excl. in [place]? --     Constitutional: Alert and oriented. Well appearing and in no acute distress. Eyes: Conjunctivae are normal. PERRL. EOMI. Head: Atraumatic. Nose: No  congestion/rhinnorhea. Mouth/Throat: Mucous membranes are moist.  Neck: No stridor.   Cardiovascular: Normal rate, regular rhythm. Grossly normal heart sounds.   Respiratory: Normal respiratory effort.  No retractions. Lungs CTAB. Gastrointestinal: Soft and nontender. No distention.  Musculoskeletal: No lower extremity tenderness.  No joint effusions.  Left small toe without any swelling or deformity. However, there is tenderness along the medial aspect of this toe. No erythema. No induration. Pain also with pasive motion. Her tenderness or swelling proximal to the site of pain. Neurologic:  Normal speech and language. No gross focal neurologic deficits are appreciated.  Skin:  Skin is warm, dry and intact. No rash noted. Psychiatric: Mood and affect are normal.  Speech and behavior are normal.  ____________________________________________   LABS (all labs ordered are listed, but only abnormal results are displayed)  Labs Reviewed - No data to display ____________________________________________  EKG   ____________________________________________  RADIOLOGY  DG Foot Complete Left (Final result)  Result time 12/01/15 23:01:35  Final result by Kristine Garbe, MD (12/01/15 23:01:35)           Narrative:   CLINICAL DATA: 40 y/o F; fracture base of pinky toe of left foot with continuing pain.  EXAM: LEFT FOOT - COMPLETE 3+ VIEW  COMPARISON: 11/25/2015 and 06/18/2014 left foot radiographs.  FINDINGS: Stable chronic fracture deformity with slight lateral subluxation of the base of fifth middle phalanx with small medial avulsed bone fragment. No new fracture identified. Lisfranc alignment is preserved. Accessory ossicles adjacent to cuboid. Pes planus. Small dorsal and plantar calcaneal enthesophytes.  IMPRESSION: Stable chronic fracture and mild subluxation of base of fifth middle phalanx. No acute fracture identified   Electronically Signed By: Kristine Garbe M.D. On: 12/01/2015 23:01          ____________________________________________   PROCEDURES  Procedure(s) performed:   Procedures  Critical Care performed:   ____________________________________________   INITIAL IMPRESSION / ASSESSMENT AND PLAN / ED COURSE  Pertinent labs & imaging results that were available during my care of the patient were reviewed by me and considered in my medical decision making (see chart for details).  Patient has multiple allergies but says she has tolerated ibuprofen in the past. I will discharge her with diclofenac gel for local use. She has an allergy listed to Vioxx but is unsure what exactly it was. However, she admits tolerating other NSAIDs. We discussed her reassuring imaging results but that  she may have injured soft tissue in the foot. She will be given follow-up for podiatry. She says that she had tried to establish care with a podiatrist who should see in the past for plantar fasciitis at [facility] but they did not have an appointment available until late December.  Clinical Course      ____________________________________________   FINAL CLINICAL IMPRESSION(S) / ED DIAGNOSES  Left small toe pain.    NEW MEDICATIONS STARTED DURING THIS VISIT:  New Prescriptions   No medications on file     Note:  This document was prepared using Dragon voice recognition software and may include unintentional dictation errors.    Orbie Pyo, MD 12/01/15 [phone]

## 2015-12-01 NOTE — ED Notes (Signed)
X-ray at bedside

## 2016-04-06 ENCOUNTER — Other Ambulatory Visit: Payer: Self-pay | Admitting: Obstetrics and Gynecology

## 2016-04-06 DIAGNOSIS — Z13820 Encounter for screening for osteoporosis: Secondary | ICD-10-CM

## 2016-04-06 DIAGNOSIS — Z1231 Encounter for screening mammogram for malignant neoplasm of breast: Secondary | ICD-10-CM

## 2016-05-23 ENCOUNTER — Emergency Department: Payer: BC Managed Care – PPO

## 2016-05-23 ENCOUNTER — Encounter: Payer: Self-pay | Admitting: Emergency Medicine

## 2016-05-23 ENCOUNTER — Emergency Department
Admission: EM | Admit: 2016-05-23 | Discharge: 2016-05-24 | Disposition: A | Discharge status: Home / Self Care | Payer: BC Managed Care – PPO | Attending: Emergency Medicine | Admitting: Emergency Medicine

## 2016-05-23 DIAGNOSIS — R1032 Left lower quadrant pain: Secondary | ICD-10-CM

## 2016-05-23 DIAGNOSIS — J45909 Unspecified asthma, uncomplicated: Secondary | ICD-10-CM | POA: Insufficient documentation

## 2016-05-23 LAB — URINALYSIS, COMPLETE (UACMP) WITH MICROSCOPIC
BACTERIA UA: NONE SEEN
Bilirubin Urine: NEGATIVE
Glucose, UA: NEGATIVE mg/dL
Ketones, ur: NEGATIVE mg/dL
Leukocytes, UA: NEGATIVE
NITRITE: NEGATIVE
PROTEIN: NEGATIVE mg/dL
SPECIFIC GRAVITY, URINE: 1.021 (ref 1.005–1.030)
pH: 5 (ref 5.0–8.0)

## 2016-05-23 LAB — COMPREHENSIVE METABOLIC PANEL
ALBUMIN: 4 g/dL (ref 3.5–5.0)
ALK PHOS: 98 U/L (ref 38–126)
ALT: 16 U/L (ref 14–54)
AST: 21 U/L (ref 15–41)
Anion gap: 6 (ref 5–15)
BILIRUBIN TOTAL: 0.3 mg/dL (ref 0.3–1.2)
BUN: 10 mg/dL (ref 6–20)
CALCIUM: 9 mg/dL (ref 8.9–10.3)
CO2: 25 mmol/L (ref 22–32)
Chloride: 106 mmol/L (ref 101–111)
Creatinine, Ser: 0.7 mg/dL (ref 0.44–1.00)
GFR calc Af Amer: >60 mL/min (ref >60)
GFR calc non Af Amer: >60 mL/min (ref >60)
GLUCOSE: 133 mg/dL — AB (ref 65–99)
Potassium: 4.4 mmol/L (ref 3.5–5.1)
Sodium: 137 mmol/L (ref 135–145)
TOTAL PROTEIN: 7.8 g/dL (ref 6.5–8.1)

## 2016-05-23 LAB — LIPASE, BLOOD: Lipase: 32 U/L (ref 11–51)

## 2016-05-23 LAB — CBC
HEMATOCRIT: 40.9 % (ref 35.0–47.0)
Hemoglobin: 13.7 g/dL (ref 12.0–16.0)
MCH: 30.4 pg (ref 26.0–34.0)
MCHC: 33.6 g/dL (ref 32.0–36.0)
MCV: 90.5 fL (ref 80.0–100.0)
Platelets: 281 10*3/uL (ref 150–440)
RBC: 4.51 MIL/uL (ref 3.80–5.20)
RDW: 13.2 % (ref 11.5–14.5)
WBC: 6.6 10*3/uL (ref 3.6–11.0)

## 2016-05-23 LAB — TROPONIN I: Troponin I: <0.03 ng/mL (ref <0.03)

## 2016-05-23 MED ORDER — IOPAMIDOL (ISOVUE-300) INJECTION 61%
30.0000 mL | Freq: Once | INTRAVENOUS | Status: DC
  Filled 2016-05-23: qty 30

## 2016-05-23 MED ORDER — HYDROMORPHONE HCL 1 MG/ML IJ SOLN
1.0000 mg | Freq: Once | INTRAMUSCULAR | Status: AC
  Administered 2016-05-23: 1 mg via INTRAVENOUS
  Filled 2016-05-23: qty 1

## 2016-05-23 MED ORDER — IOPAMIDOL (ISOVUE-300) INJECTION 61%
100.0000 mL | Freq: Once | INTRAVENOUS | Status: AC | PRN
Start: 2016-05-23 — End: 2016-05-23
  Administered 2016-05-23: 100 mL via INTRAVENOUS

## 2016-05-23 MED ORDER — DIPHENHYDRAMINE HCL 25 MG PO CAPS
25.0000 mg | ORAL_CAPSULE | Freq: Once | ORAL | Status: AC
  Administered 2016-05-23: 25 mg via ORAL

## 2016-05-23 MED ORDER — HYDROMORPHONE HCL 1 MG/ML IJ SOLN
1.0000 mg | Freq: Once | INTRAMUSCULAR | Status: AC
  Administered 2016-05-24: 1 mg via INTRAVENOUS
  Filled 2016-05-23: qty 1

## 2016-05-23 MED ORDER — DIPHENHYDRAMINE HCL 25 MG PO CAPS
ORAL_CAPSULE | ORAL | Status: AC
  Administered 2016-05-23: 25 mg via ORAL
  Filled 2016-05-23: qty 1

## 2016-05-23 NOTE — ED Notes (Signed)
Patient transported to CT 

## 2016-05-23 NOTE — ED Provider Notes (Signed)
[facility] Emergency Department Provider Note    First MD Initiated Contact with Patient 05/23/16 2157     (approximate)  I have reviewed the triage vital signs and the nursing notes.   HISTORY  Chief Complaint Abdominal Pain   HPI Debbie Sosa is a 41 y.o. female with below list of chronic medical conditions presents emergency Department left upper/left lower quadrant abdominal pain is currently 10 out of 10 described as sharp. Patient states pain began while driving home from work this evening and was accompanied by nausea. Patient denies any vomiting. Patient states that she's expresses pain in the past and was scheduled for colonoscopy however that was not performed. Patient states that there is also concern for possible ulcer status post gastric bypass. Patient denies any fever last bowel movement today. Patient denies any urinary symptoms.   Past Medical History:  Diagnosis Date  . Asthma   . Pulmonary embolism [facility])     Patient Active Problem List   Diagnosis Date Noted  . Pulmonary embolism [facility])     Past Surgical History:  Procedure Laterality Date  . BREAST BIOPSY Left 2006   benign  . BREAST BIOPSY Right 2015   benign  . BREAST REDUCTION SURGERY    . CESAREAN SECTION    . CESAREAN SECTION    . CHOLECYSTECTOMY    . GASTRIC BYPASS     01/2015  . KNEE SURGERY    . PLANTAR FASCIA SURGERY     x 3  . REDUCTION MAMMAPLASTY Bilateral 2005    Prior to Admission medications   Medication Sig Start Date End Date Taking? Authorizing Provider  Diclofenac Sodium 3 % GEL Place 1 application onto the skin every 12 (twelve) hours as needed (pain). 12/01/15   Orbie Pyo, MD    Allergies Contrast media [iodinated diagnostic agents]; Latex; Morphine and related; Pyridium [phenazopyridine hcl]; Sulfa antibiotics; Tramadol; and Vioxx [rofecoxib]  Family History  Problem Relation Age of Onset  . Breast cancer Mother 48  .  Breast cancer Cousin 40    2 maternal cousins     Social History Social History  Substance Use Topics  . Smoking status: Never Smoker  . Smokeless tobacco: Never Used  . Alcohol use No    Review of Systems Constitutional: No fever/chills Eyes: No visual changes. ENT: No sore throat. Cardiovascular: Denies chest pain. Respiratory: Denies shortness of breath. Gastrointestinal: Positive for abdominal pain and nausea Genitourinary: Negative for dysuria. Musculoskeletal: Negative for back pain. Integumentary: Negative for rash. Neurological: Negative for headaches, focal weakness or numbness.   ____________________________________________   PHYSICAL EXAM:  VITAL SIGNS: ED Triage Vitals  Enc Vitals Group     BP 05/23/16 1822 112/75     Pulse Rate 05/23/16 1822 82     Resp 05/23/16 2104 12     Temp 05/23/16 1822 98.3 F (36.8 C)     Temp Source 05/23/16 1822 Oral     SpO2 05/23/16 1822 100 %     Weight 05/23/16 1822 170 lb (77.1 kg)     Height 05/23/16 1822 5\' 2"  (1.575 m)     Head Circumference --      Peak Flow --      Pain Score 05/23/16 1821 10     Pain Loc --      Pain Edu? --      Excl. in [place]? --     Constitutional: Alert and oriented. Apparent discomfort.  Eyes: Conjunctivae are  normal. PERRL. EOMI. Head: Atraumatic. Mouth/Throat: Mucous membranes are moist. Oropharynx non-erythematous. Neck: No stridor.   Cardiovascular: Normal rate, regular rhythm. Good peripheral circulation. Grossly normal heart sounds. Respiratory: Normal respiratory effort.  No retractions. Lungs CTAB. Gastrointestinal: Soft and nontender. No distention.  Musculoskeletal: No lower extremity tenderness nor edema. No gross deformities of extremities. Neurologic:  Normal speech and language. No gross focal neurologic deficits are appreciated.  Skin:  Skin is warm, dry and intact. No rash noted. Psychiatric: Mood and affect are normal. Speech and behavior are  normal. ____________________________________________   LABS (all labs ordered are listed, but only abnormal results are displayed)  Labs Reviewed  COMPREHENSIVE METABOLIC PANEL - Abnormal; Notable for the following:       Result Value   Glucose, Bld 133 (*)    All other components within normal limits  URINALYSIS, COMPLETE (UACMP) WITH MICROSCOPIC - Abnormal; Notable for the following:    Color, Urine YELLOW (*)    APPearance HAZY (*)    Hgb urine dipstick SMALL (*)    Squamous Epithelial / LPF 0-5 (*)    All other components within normal limits  LIPASE, BLOOD  CBC  TROPONIN I   ____________________________________________  EKG  ED ECG REPORT I, [place] Madalaine Portier, the attending physician, personally viewed and interpreted this ECG.   Date: 05/23/2016  EKG Time: 8:58 PM  Rate: 62  Rhythm: Normal sinus rhythm  Axis: Normal  Intervals: Normal  ST&T Change: Normal  ____________________________________________  RADIOLOGY I, [place] Jermayne Sweeney, personally viewed and evaluated these images (plain radiographs) as part of my medical decision making, as well as reviewing the written report by the radiologist.  Ct Abdomen Pelvis W Contrast  Result Date: 05/24/2016 CLINICAL DATA:  41 y/o F; left upper quadrant and left lower quadrant abdominal pain with nausea. History of gastric bypass and cholecystectomy. EXAM: CT ABDOMEN AND PELVIS WITH CONTRAST TECHNIQUE: Multidetector CT imaging of the abdomen and pelvis was performed using the standard protocol following bolus administration of intravenous contrast. CONTRAST:  162mL ISOVUE-300 IOPAMIDOL (ISOVUE-300) INJECTION 61% COMPARISON:  08/02/2008 CT of the abdomen and pelvis. FINDINGS: Lower chest: Pulmonary nodules along the major fissures measuring up to 7 mm on the right. Mildly patulous and fluid-filled esophagus. Hepatobiliary: No focal liver abnormality is seen. Status post cholecystectomy. No biliary dilatation. Pancreas: Unremarkable.  No pancreatic ductal dilatation or surrounding inflammatory changes. Spleen: Normal in size without focal abnormality. Adrenals/Urinary Tract: 3 mm right kidney interpolar nonobstructing stone (Series 5, image 49). Normal adrenal glands. No additional focal renal lesion identified. No hydronephrosis. Normal bladder. Stomach/Bowel: Status post gastric bypass. No obstructive or inflammatory changes of the bowel are identified. Normal appendix. Left hemiabdomen enteroenteric anastomosis appears unremarkable. Vascular/Lymphatic: No significant vascular findings are present. No enlarged abdominal or pelvic lymph nodes. Reproductive: Retroverted uterus with IUD seated in the fundus. Normal adnexa. Other: No abdominal wall hernia or abnormality. No abdominopelvic ascites. Musculoskeletal: No acute osseous abnormality is evident. IMPRESSION: 1. No acute process identified as explanation for abdominal pain. 2. Status post gastric bypass. No obstructive or inflammatory changes of bowel. 3. 3 mm right kidney interpolar nonobstructing stone. 4. Pulmonary nodules along the major fissures measuring up to 7 mm on the right. Non-contrast chest CT at 3-6 months is recommended. If the nodules are stable at time of repeat CT, then future CT at 18-24 months (from today's scan) is considered optional for low-risk patients, but is recommended for high-risk patients. This recommendation follows the consensus statement: Guidelines  for Management of Incidental Pulmonary Nodules Detected on CT Images: From the Fleischner Society 2017; Radiology 2017; [id number]. Electronically Signed   By: Kristine Garbe M.D.   On: 05/24/2016 00:04     Procedures   ____________________________________________   INITIAL IMPRESSION / ASSESSMENT AND PLAN / ED COURSE  Pertinent labs & imaging results that were available during my care of the patient were reviewed by me and considered in my medical decision making (see chart for  details).  41 year old female presenting with left upper quadrant/left lower quadrant reproducible abdominal pain. Patient received Dilaudid 1 mg 2 doses in the emergency Department with improvement of pain. No clear etiology of the patient's abdominal pain identified on CAT scan. Patient will be referred to Dr. Allen Norris  gastroenterologist for further outpatient evaluation.      ____________________________________________  FINAL CLINICAL IMPRESSION(S) / ED DIAGNOSES  Final diagnoses:  Left lower quadrant pain     MEDICATIONS GIVEN DURING THIS VISIT:  Medications  HYDROmorphone (DILAUDID) injection 1 mg (1 mg Intravenous Given 05/23/16 2221)  diphenhydrAMINE (BENADRYL) capsule 25 mg (25 mg Oral Given 05/23/16 2241)  iopamidol (ISOVUE-300) 61 % injection 100 mL (100 mLs Intravenous Contrast Given 05/23/16 2343)  HYDROmorphone (DILAUDID) injection 1 mg (1 mg Intravenous Given 05/24/16 0001)     NEW OUTPATIENT MEDICATIONS STARTED DURING THIS VISIT:  New Prescriptions   No medications on file    Modified Medications   No medications on file    Discontinued Medications   No medications on file     Note:  This document was prepared using Dragon voice recognition software and may include unintentional dictation errors.    Gregor Hams, MD 05/24/16 [phone]

## 2016-05-23 NOTE — ED Notes (Signed)
Pt requested that EMS's IV be removed as "it doesn't feel right." Attempted to draw blood off of it with no success, and attempted to flush it. Pt cried out when flush attempted. This nurse removed the IV and reinserted another.

## 2016-05-23 NOTE — ED Triage Notes (Signed)
Pt presents via ems with luq pain. She believes it is related to her gastric bypass surgery 1 year ago. She states she has been seen at [facility] for possible ulcer, but this is worse. She was scheduled for colonoscopy in November but did not have it due to having too much stool in the colon. Pt also reports nausea, but declines zofran because "it makes it worse." Pt alert & oriented with NAD noted.

## 2016-07-05 ENCOUNTER — Ambulatory Visit: Payer: BC Managed Care – PPO | Admitting: Gastroenterology

## 2016-08-16 ENCOUNTER — Ambulatory Visit
Admission: RE | Admit: 2016-08-16 | Discharge: 2016-08-16 | Disposition: A | Discharge status: Home / Self Care | Payer: BC Managed Care – PPO | Source: Ambulatory Visit | Attending: Obstetrics and Gynecology | Admitting: Obstetrics and Gynecology

## 2016-08-16 DIAGNOSIS — N6489 Other specified disorders of breast: Secondary | ICD-10-CM | POA: Insufficient documentation

## 2016-08-16 DIAGNOSIS — Z13820 Encounter for screening for osteoporosis: Secondary | ICD-10-CM | POA: Insufficient documentation

## 2016-08-16 DIAGNOSIS — R928 Other abnormal and inconclusive findings on diagnostic imaging of breast: Secondary | ICD-10-CM | POA: Diagnosis not present

## 2016-08-16 DIAGNOSIS — M8588 Other specified disorders of bone density and structure, other site: Secondary | ICD-10-CM | POA: Insufficient documentation

## 2016-08-16 DIAGNOSIS — Z1231 Encounter for screening mammogram for malignant neoplasm of breast: Secondary | ICD-10-CM | POA: Insufficient documentation

## 2016-08-17 ENCOUNTER — Other Ambulatory Visit: Payer: Self-pay

## 2016-08-17 ENCOUNTER — Encounter: Payer: Self-pay | Admitting: Gastroenterology

## 2016-08-17 ENCOUNTER — Ambulatory Visit (INDEPENDENT_AMBULATORY_CARE_PROVIDER_SITE_OTHER): Payer: BC Managed Care – PPO | Admitting: Gastroenterology

## 2016-08-17 VITALS — BP 113/78 | HR 79 | Temp 98.0°F | Ht 62.0 in | Wt 188.6 lb

## 2016-08-17 DIAGNOSIS — R1012 Left upper quadrant pain: Secondary | ICD-10-CM

## 2016-08-17 DIAGNOSIS — R1310 Dysphagia, unspecified: Secondary | ICD-10-CM | POA: Insufficient documentation

## 2016-08-17 DIAGNOSIS — J040 Acute laryngitis: Secondary | ICD-10-CM | POA: Insufficient documentation

## 2016-08-17 DIAGNOSIS — G039 Meningitis, unspecified: Secondary | ICD-10-CM | POA: Insufficient documentation

## 2016-08-17 DIAGNOSIS — J382 Nodules of vocal cords: Secondary | ICD-10-CM | POA: Insufficient documentation

## 2016-08-17 MED ORDER — OMEPRAZOLE 40 MG PO CPDR: 40.0000 mg | DELAYED_RELEASE_CAPSULE | Freq: Every day | ORAL | 2 refills | Status: DC

## 2016-08-17 NOTE — Progress Notes (Signed)
Debbie Bellows MD, MRCP(U.K) [address]  [place]  [place], [place] [address]  Main: [phone]  Fax: [phone]   Gastroenterology Consultation  Referring Provider:     No ref. provider found Primary Care Physician:  Patient, No Pcp Per Primary Gastroenterologist:  Debbie. Jonathon Sosa  Reason for Consultation:    Diverticulitis        HPI:   Debbie Sosa is a 41 y.o. y/o female referred for diverticulitis.   She was seen in the ER on 05/23/16 for LUQ abdominal pain , reproducible , underwent a CT scan of the abdomen which did not show any acute process and has been referred for outpatient evaluation. 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   Labs in 05/2016- [facility], CMP- normal . Since the ER visit doing well , sometimes has abdominal pains.    Abdominal pain: Onset: Had a gastric by pass in 01/2015- Abdominal pains since the surgery , Occurs every few days, At times the pain can last for hours and at times for a few days . Her primary care doctor Debbie Sosa- gave her miralax and didn't help Site :Points to left side of her abdomen  Radiation: localized  Severity :mild  Nature of pain: Pressure  Aggravating factors: eating -usually between 5-15 mins after eating  Relieving factors :laying down  Weight loss: yes after surgery  NSAID use: no  PPI use :Protonix- was stopped.  Gall bladder surgery: resected in 2000 Frequency of bowel movements: every day but presently has some diarrhea last two days Change in bowel movements: no  Relief with bowel movements: yes  Gas/Bloating/Abdominal distension: bloating when she eats green leafy veggies and gets abdominal pain.    Past Medical History:  Diagnosis Date  . Asthma   . Pulmonary embolism [facility])     Past Surgical History:  Procedure Laterality Date  . BREAST BIOPSY Left 2006   benign  . BREAST BIOPSY Right 2015   benign/clip  . BREAST REDUCTION SURGERY    . CESAREAN SECTION    . CESAREAN SECTION    .  CHOLECYSTECTOMY    . GASTRIC BYPASS     01/2015  . KNEE SURGERY    . PLANTAR FASCIA SURGERY     x 3  . REDUCTION MAMMAPLASTY Bilateral 2005    Prior to Admission medications   Medication Sig Start Date End Date Taking? Authorizing Provider  albuterol (PROVENTIL HFA;VENTOLIN HFA) 108 (90 Base) MCG/ACT inhaler Inhale 1-2 puffs INH Q4-6hr prn for chest tightness, cough, wheezing, SOB/DOE. 03/16/15  Yes [provider]  amoxicillin (AMOXIL) 250 MG/5ML suspension amoxicillin 250 mg/5 mL oral suspension   Yes [provider]  amoxicillin (AMOXIL) 500 MG tablet amoxicillin 500 mg tablet   Yes [provider]  EPINEPHrine (EPIPEN 2-PAK) 0.3 mg/0.3 mL IJ SOAJ injection EpiPen 2-Pak 0.3 mg/0.3 mL injection, auto-injector   Yes [provider]  vitamin B-12 (CYANOCOBALAMIN) 1000 MCG tablet Take by mouth.   Yes [provider]  azithromycin (ZITHROMAX) 250 MG tablet take 2 tablets by mouth on day 1 in one dose then 1 tablet on days 2 through 5 07/06/16   [provider]  Benzocaine-Menthol 15-3.6 MG LOZG Sore Throat (benzocaine with menthol) 15 mg-3.6 mg lozenges  USE EVERY 2 HOURS AS NEEDED    [provider]  benzonatate (TESSALON) 200 MG capsule take 1 capsule by mouth twice a day if needed 07/06/16   [provider]  Diclofenac Sodium 3 % GEL Place 1 application onto the skin every 12 (twelve) hours as needed (pain). 12/01/15   Orbie Pyo, MD  esomeprazole (NEXIUM) 40 MG packet Nexium Packet 40 mg granules delayed release for susp  Take 1 packet every day by oral route for 30 days.    [provider]  fluticasone (FLONASE) 50 MCG/ACT nasal spray once as needed.  05/08/14   [provider]  ibuprofen (ADVIL,MOTRIN) 800 MG tablet ibuprofen 800 mg tablet    [provider]  ketorolac (TORADOL) 10 MG tablet ketorolac 10 mg tablet    [provider]  metaxalone (SKELAXIN) 800 MG tablet  metaxalone 800 mg tablet    [provider]  montelukast (SINGULAIR) 10 MG tablet Take by mouth. 08/20/15   [provider]  Multiple Vitamin (MULTI-VITAMINS) TABS Take by mouth.    [provider]  nitrofurantoin, macrocrystal-monohydrate, (MACROBID) 100 MG capsule take 1 capsule by mouth twice a day with food 06/07/16   [provider]  omeprazole (PRILOSEC) 40 MG capsule Take 40 mg by mouth.    [provider]  ondansetron (ZOFRAN-ODT) 4 MG disintegrating tablet Take by mouth. 12/01/14   [provider]  pantoprazole (PROTONIX) 40 MG tablet Take by mouth. 10/14/15   [provider]  phentermine (ADIPEX-P) 37.5 MG tablet phentermine 37.5 mg tablet  take 1 tablet by mouth every morning    [provider]  prednisoLONE (PRELONE) 15 MG/5ML SOLN once as needed.  07/09/14   [provider]  prednisoLONE sodium phosphate (PEDIAPRED) 6.7 (5 Base) MG/5ML SOLN prednisolone 5 mg/5 mL oral solution  Take 10 mL every day by oral route for 6 days.    [provider]  sucralfate (CARAFATE) 1 g tablet Take by mouth. 10/14/15   [provider]  sulfamethoxazole-trimethoprim (BACTRIM DS,SEPTRA DS) 800-160 MG tablet sulfamethoxazole 800 mg-trimethoprim 160 mg tablet    [provider]  tobramycin (TOBREX) 0.3 % ophthalmic solution instill 1-2 drops into affected eye twice a day 07/07/16   [provider]  traMADol (ULTRAM) 50 MG tablet take 2 tablets by mouth every 8 hours if needed 06/01/16   [provider]    Family History  Problem Relation Age of Onset  . Breast cancer Mother 90  . Breast cancer Cousin 33       3 mat cousins -2 alive     Social History  Substance Use Topics  . Smoking status: Never Smoker  . Smokeless tobacco: Never Used  . Alcohol use No    Allergies as of 08/17/2016 - Review Complete 08/17/2016  Allergen Reaction Noted  . Cephalexin Hives and Rash 03/12/2014    . Fexofenadine Shortness Of Breath   . Ketorolac Hives 03/12/2014  . Morphine Anaphylaxis 07/01/2013  . Phenazopyridine Rash   . Promethazine Hives 03/12/2014  . Contrast media [iodinated diagnostic agents] Hives 05/23/2016  . Eggs or egg-derived products  07/01/2013  . Latex  06/18/2014  . Morphine and related  06/18/2014  . Other Hives 02/23/2013  . Pyridium [phenazopyridine hcl]  06/18/2014  . Tramadol  06/18/2014  . Vioxx [rofecoxib]  06/18/2014  . Hepatitis b virus vaccine Rash 07/01/2013  . Naproxen Rash 07/01/2013  . Oxycodone-acetaminophen Itching 03/12/2014  . Sulfa antibiotics Rash 06/18/2014    Review of Systems:    All systems reviewed and negative except where noted in HPI.   Physical Exam:  BP 113/78   Pulse 79   Temp 98 F (  36.7 C) (Oral)   Ht 5\' 2"  (1.575 m)   Wt 188 lb 9.6 oz (85.5 kg)   BMI 34.50 kg/m  No LMP recorded. Patient is not currently having periods (Reason: IUD). Psych:  Alert and cooperative. Normal mood and affect. General:   Alert,  Well-developed, well-nourished, pleasant and cooperative in NAD Head:  Normocephalic and atraumatic. Eyes:  Sclera clear, no icterus.   Conjunctiva pink. Ears:  Normal auditory acuity. Nose:  No deformity, discharge, or lesions. Mouth:  No deformity or lesions,oropharynx pink & moist. Neck:  Supple; no masses or thyromegaly. Lungs:  Respirations even and unlabored.  Clear throughout to auscultation.   No wheezes, crackles, or rhonchi. No acute distress. Heart:  Regular rate and rhythm; no murmurs, clicks, rubs, or gallops. Abdomen:  Normal bowel sounds.  No bruits.  Soft, non-tender and non-distended without masses, hepatosplenomegaly or hernias noted.  No guarding or rebound tenderness.    Msk:  Symmetrical without gross deformities. Good, equal movement & strength bilaterally. Pulses:  Normal pulses noted. Extremities:  No clubbing or edema.  No cyanosis. Neurologic:  Alert and oriented x3;  grossly normal  neurologically. Skin:  Intact without significant lesions or rashes. No jaundice. Lymph Nodes:  No significant cervical adenopathy. Psych:  Alert and cooperative. Normal mood and affect.  Imaging Studies: Dg Bone Density (dxa)  Result Date: 08/16/2016 EXAM: DUAL X-RAY ABSORPTIOMETRY (DXA) FOR BONE MINERAL DENSITY IMPRESSION: Dear Debbie Elgie Collard, Your patient Debbie Sosa completed a BMD test on 08/16/2016 using the [place] (analysis version: 14.10) manufactured by [employer]. The following summarizes the results of our evaluation. PATIENT BIOGRAPHICAL: Name: Debbie Sosa, Debbie Sosa Patient ID: [id number] Birth Date: 07/23/1975 Height: 61.0 in. Gender: Female Exam Date: 08/16/2016 Weight: 185.6 lbs. Indications: Family History of Fracture, Family Hx of Osteoporosis, History of Fracture (Adult) Fractures: TOES Treatments: albuterol, multivitamin, Vitamin D ASSESSMENT: The BMD measured at AP Spine L1-L4 is 1.035 g/cm2 with a T-score of -1.2. This patient is considered osteopenic according to [facility]) criteria. Site Region Measured Measured WHO Young Adult BMD Date       Age      Classification T-score AP Spine L1-L4 8/16/2016 41.3 Osteopenia -1.2 1.035 g/cm2 AP Spine L1-L4 10/23/2012 37.5 Osteopenia -2.0 0.950 g/cm2 DualFemur Total Right 08/16/2016 41.3 Normal -0.5 0.949 g/cm2 DualFemur Total Right 10/23/2012 37.5 Normal -0.5 0.950 g/cm2 World Health [facility]) criteria for post-menopausal, Caucasian Women: Normal:       T-score at or above -1 SD Osteopenia:   T-score between -1 and -2.5 SD Osteoporosis: T-score at or below -2.5 SD RECOMMENDATIONS: [place] recommends that FDA-approved medical therapies be considered in postemenopausal women and men age 27 or older with a: 1. Hip or vertebral (clinical or morphometric) fracture. 2. T-score of < -2.5at the spine or hip. 3. Ten-year fracture probability by FRAX of 3% or  greater for hip fracture or 20% or greater for major osteoporotic fracture. All treatment decisions require clinical judgment and consideration of individual patient factors, including patient preferences, co-morbidities, previous drug use, risk factors not captured in the FRAX model (e.g. falls, vitamin D deficiency, increased bone turnover, interval significant decline in bone density) and possible under - or over-estimation of fracture risk by FRAX. All patients should ensure an adequate intake of dietary calcium (1200 mg/d) and vitamin D (800 IU daily) unless contraindicated. FOLLOW-UP: People with diagnosed cases of osteoporosis or at high risk for fracture should have regular bone mineral  density tests. For patients eligible for Medicare, routine testing is allowed once every 2 years. The testing frequency can be increased to one year for patients who have rapidly progressing disease, those who are receiving or discontinuing medical therapy to restore bone mass, or have additional risk factors. I have reviewed this report, and agree with the above findings. [facility] Radiology Dear Debbie Elgie Collard, Your patient Debbie Sosa completed a FRAX assessment on 08/16/2016 using the [place] (analysis version: 14.10) manufactured by [employer]. The following summarizes the results of our evaluation. PATIENT BIOGRAPHICAL: Name: Debbie, Sosa Patient ID: [id number] Birth Date: 05/08/1975 Height:    61.0 in. Gender:     [age]    [age]       Weight:    185.6 lbs. Ethnicity:  Black                            Exam Date: 08/16/2016 FRAX* RESULTS:  (version: 3.5) 10-year Probability of Fracture1 Major Osteoporotic Fracture2 Hip Fracture 1.6% 0.0% Population: [place] (Black) Risk Factors: History of Fracture (Adult) Based on Femur (Right) Neck BMD 1 -The 10-year probability of fracture may be lower than reported if the patient has received treatment. 2 -Major Osteoporotic  Fracture: Clinical Spine, Forearm, Hip or Shoulder *FRAX is a Materials engineer of the [employer] for Metabolic Bone Disease, a [place] (WHO) [facility]. ASSESSMENT: The probability of a major osteoporotic fracture is 1.6% within the next ten years. The probability of a hip fracture is 0.0% within the next ten years. Electronically Signed   By: Lowella Grip III M.D.   On: 08/16/2016 14:50   Mm Digital Screening Bilateral  Result Date: 08/16/2016 CLINICAL DATA:  Screening. EXAM: DIGITAL SCREENING BILATERAL MAMMOGRAM WITH CAD COMPARISON:  Previous exam(s). ACR Breast Density Category c: The breast tissue is heterogeneously dense, which may obscure small masses. FINDINGS: In the left breast, a possible asymmetry warrants further evaluation. This possible small asymmetry is seen within the outer left breast, at middle to posterior depth, on the CC projection only. In the right breast, no findings suspicious for malignancy. Images were processed with CAD. IMPRESSION: Further evaluation is suggested for possible asymmetry in the left breast. RECOMMENDATION: Diagnostic mammogram and possibly ultrasound of the left breast. (Code:[id number]) The patient will be contacted regarding the findings, and additional imaging will be scheduled. BI-RADS CATEGORY  0: Incomplete. Need additional imaging evaluation and/or prior mammograms for comparison. Electronically Signed   By: Franki Cabot M.D.   On: 08/16/2016 14:17    Assessment and Plan:   MAYERLI KIRST is a 41 y.o. y/o female has been referred for abdominal pain LUQ. S/p gastric bypass. NO nsaid use or smoking . Abdominal pain  Could be from anastamotic ulcers or from adhesions or from constipation or a combination    Plan  1. Check stool for H pylori  2. Prilosec 40 mg once a day  3. EGD to evaluate for anastamotic ulcers 4. Miralax for constipation PRN  I have discussed alternative options,  risks & benefits,  which include, but are not limited to, bleeding, infection, perforation,respiratory complication & drug reaction.  The patient agrees with this plan & written consent will be obtained.    Follow up in 8 weeks   Debbie Debbie Bellows MD,MRCP(U.K)

## 2016-08-18 ENCOUNTER — Other Ambulatory Visit: Payer: Self-pay | Admitting: Obstetrics and Gynecology

## 2016-08-18 DIAGNOSIS — N6489 Other specified disorders of breast: Secondary | ICD-10-CM

## 2016-08-18 DIAGNOSIS — R928 Other abnormal and inconclusive findings on diagnostic imaging of breast: Secondary | ICD-10-CM

## 2016-08-31 ENCOUNTER — Other Ambulatory Visit
Admission: RE | Admit: 2016-08-31 | Discharge: 2016-08-31 | Disposition: A | Discharge status: Home / Self Care | Payer: BC Managed Care – PPO | Source: Ambulatory Visit | Attending: Gastroenterology | Admitting: Gastroenterology

## 2016-08-31 DIAGNOSIS — R1012 Left upper quadrant pain: Secondary | ICD-10-CM | POA: Diagnosis not present

## 2016-09-01 ENCOUNTER — Ambulatory Visit: Payer: BC Managed Care – PPO

## 2016-09-01 ENCOUNTER — Other Ambulatory Visit: Payer: BC Managed Care – PPO

## 2016-09-01 LAB — H. PYLORI ANTIGEN, STOOL: H. PYLORI STOOL AG, EIA: NEGATIVE

## 2016-09-06 ENCOUNTER — Ambulatory Visit
Admission: RE | Admit: 2016-09-06 | Discharge: 2016-09-06 | Disposition: A | Discharge status: Home / Self Care | Payer: BC Managed Care – PPO | Source: Ambulatory Visit | Attending: Obstetrics and Gynecology | Admitting: Obstetrics and Gynecology

## 2016-09-06 DIAGNOSIS — N6489 Other specified disorders of breast: Secondary | ICD-10-CM

## 2016-09-06 DIAGNOSIS — R928 Other abnormal and inconclusive findings on diagnostic imaging of breast: Secondary | ICD-10-CM

## 2016-09-12 ENCOUNTER — Telehealth: Payer: Self-pay

## 2016-09-12 NOTE — Telephone Encounter (Signed)
Advised patient of results per Dr. Vicente Males.   Patient states diarrhea is intermittent. Keeping scheduled date of 9/18

## 2016-09-12 NOTE — Telephone Encounter (Signed)
-----   Message from [name], MD sent at 09/12/2016  7:54 AM EDT ----- H pylori stool antigen negative

## 2016-10-02 ENCOUNTER — Emergency Department: Payer: BC Managed Care – PPO

## 2016-10-02 ENCOUNTER — Emergency Department
Admission: EM | Admit: 2016-10-02 | Discharge: 2016-10-02 | Disposition: A | Discharge status: Home / Self Care | Payer: BC Managed Care – PPO | Attending: Emergency Medicine | Admitting: Emergency Medicine

## 2016-10-02 DIAGNOSIS — J45909 Unspecified asthma, uncomplicated: Secondary | ICD-10-CM | POA: Diagnosis not present

## 2016-10-02 DIAGNOSIS — Z91040 Latex allergy status: Secondary | ICD-10-CM | POA: Diagnosis not present

## 2016-10-02 DIAGNOSIS — R109 Unspecified abdominal pain: Secondary | ICD-10-CM | POA: Diagnosis present

## 2016-10-02 DIAGNOSIS — Z79899 Other long term (current) drug therapy: Secondary | ICD-10-CM | POA: Insufficient documentation

## 2016-10-02 LAB — BASIC METABOLIC PANEL
Anion gap: 9 (ref 5–15)
BUN: 14 mg/dL (ref 6–20)
CO2: 20 mmol/L — AB (ref 22–32)
Calcium: 9.3 mg/dL (ref 8.9–10.3)
Chloride: 109 mmol/L (ref 101–111)
Creatinine, Ser: 0.88 mg/dL (ref 0.44–1.00)
GFR calc Af Amer: >60 mL/min (ref >60)
Glucose, Bld: 196 mg/dL — ABNORMAL HIGH (ref 65–99)
POTASSIUM: 3.9 mmol/L (ref 3.5–5.1)
Sodium: 138 mmol/L (ref 135–145)

## 2016-10-02 LAB — URINALYSIS, COMPLETE (UACMP) WITH MICROSCOPIC
Bilirubin Urine: NEGATIVE
GLUCOSE, UA: 50 mg/dL — AB
Ketones, ur: 5 mg/dL — AB
Leukocytes, UA: NEGATIVE
Nitrite: NEGATIVE
PH: 6 (ref 5.0–8.0)
Protein, ur: NEGATIVE mg/dL
SPECIFIC GRAVITY, URINE: 1.018 (ref 1.005–1.030)

## 2016-10-02 LAB — CBC
HEMATOCRIT: 41.2 % (ref 35.0–47.0)
HEMOGLOBIN: 14.2 g/dL (ref 12.0–16.0)
MCH: 30.5 pg (ref 26.0–34.0)
MCHC: 34.3 g/dL (ref 32.0–36.0)
MCV: 88.7 fL (ref 80.0–100.0)
Platelets: 298 10*3/uL (ref 150–440)
RBC: 4.65 MIL/uL (ref 3.80–5.20)
RDW: 13.3 % (ref 11.5–14.5)
WBC: 5.8 10*3/uL (ref 3.6–11.0)

## 2016-10-02 LAB — CHLAMYDIA/NGC RT PCR (ARMC ONLY)
Chlamydia Tr: NOT DETECTED
N GONORRHOEAE: NOT DETECTED

## 2016-10-02 LAB — WET PREP, GENITAL
Clue Cells Wet Prep HPF POC: NONE SEEN
SPERM: NONE SEEN
Trich, Wet Prep: NONE SEEN
YEAST WET PREP: NONE SEEN

## 2016-10-02 LAB — PREGNANCY, URINE: Preg Test, Ur: NEGATIVE

## 2016-10-02 MED ORDER — ACETAMINOPHEN 500 MG PO TABS
1000.0000 mg | ORAL_TABLET | ORAL | Status: AC
  Administered 2016-10-02: 1000 mg via ORAL
  Filled 2016-10-02 (×2): qty 2

## 2016-10-02 MED ORDER — BARIUM SULFATE 2.1 % PO SUSP
450.0000 mL | ORAL | Status: AC
  Filled 2016-10-02 (×2): qty 450

## 2016-10-02 NOTE — Discharge Instructions (Signed)
Please continue your current medications, including ciprofloxacin as prior prescribed.  Please return to the emergency room right away if you are to develop a fever, severe nausea, your pain becomes severe or worsens, you are unable to keep food down, begin vomiting any dark or bloody fluid, you develop any dark or bloody stools, feel dehydrated, or other new concerns or symptoms arise.

## 2016-10-02 NOTE — ED Provider Notes (Signed)
[facility] Emergency Department Provider Note  ____________________________________________   First MD Initiated Contact with Patient 10/02/16 1815     (approximate)  I have reviewed the triage vital signs and the nursing notes.   HISTORY  Chief Complaint Flank Pain    HPI Debbie Sosa is a 41 y.o. female reports she's been having discomfort in her left flank for several days, about 1 week. She went to urgent care yesterday and they told her they believe she had some blood in her urine and likely is passing a kidney stone. She was placed on Cipro, Flomax, and tramadol for discomfort. She reports yesterday and last night the kidney stone seemed to have moved she felt now moved from her left back towards her left lower abdomen and the pain is worsened. No vomiting. Some mild nausea which is improved now. No pain or burning with urination today, but does report slight urgency. No diarrhea.  She reports that she called the urgent care who advised her to come to the ER to have this further evaluated. She also reports a history of a previous gastric bypass.prior pulmonary embolus   Past Medical History:  Diagnosis Date  . Asthma   . Pulmonary embolism [facility])     Patient Active Problem List   Diagnosis Date Noted  . Acute laryngitis 08/17/2016  . Dysphagia 08/17/2016  . Meningitis 08/17/2016  . 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 Diarrhea, unspecified 10/14/2015  . Left lower quadrant pain 10/14/2015  . 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  . 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 Achalasia 01/07/2015  . Central sleep apnea 01/07/2015  . Obesity (BMI 30-39.9) 01/07/2015  . GERD (gastroesophageal reflux disease) 12/25/2014  . Pulmonary embolism ([place])   . Plantar fasciitis of right foot 12/31/2013    Past Surgical History:  Procedure Laterality Date  . BREAST BIOPSY Left 2006   benign/clip  . BREAST BIOPSY Right 2015   benign/clip  . BREAST REDUCTION  SURGERY    . CESAREAN SECTION    . CESAREAN SECTION    . CHOLECYSTECTOMY    . GASTRIC BYPASS     01/2015  . KNEE SURGERY    . PLANTAR FASCIA SURGERY     x 3  . REDUCTION MAMMAPLASTY Bilateral 2005    Prior to Admission medications   Medication Sig Start Date End Date Taking? Authorizing Provider  albuterol (PROVENTIL HFA;VENTOLIN HFA) 108 (90 Base) MCG/ACT inhaler Inhale 1-2 puffs INH Q4-6hr prn for chest tightness, cough, wheezing, SOB/DOE. 03/16/15   [provider]  amoxicillin (AMOXIL) 250 MG/5ML suspension amoxicillin 250 mg/5 mL oral suspension    [provider]  amoxicillin (AMOXIL) 500 MG tablet amoxicillin 500 mg tablet    [provider]  azithromycin (ZITHROMAX) 250 MG tablet take 2 tablets by mouth on day 1 in one dose then 1 tablet on days 2 through 5 07/06/16   [provider]  Benzocaine-Menthol 15-3.6 MG LOZG Sore Throat (benzocaine with menthol) 15 mg-3.6 mg lozenges  USE EVERY 2 HOURS AS NEEDED    [provider]  benzonatate (TESSALON) 200 MG capsule take 1 capsule by mouth twice a day if needed 07/06/16   [provider]  Diclofenac Sodium 3 % GEL Place 1 application onto the skin every 12 (twelve) hours as needed (pain). 12/01/15   Orbie Pyo, MD  EPINEPHrine (EPIPEN 2-PAK) 0.3 mg/0.3 mL IJ SOAJ injection EpiPen 2-Pak 0.3 mg/0.3 mL injection, auto-injector    [provider]  esomeprazole (NEXIUM) 40 MG packet Nexium Packet 40 mg granules delayed release for susp  Take 1 packet every day by oral route for 30 days.    [provider]  fluticasone (FLONASE) 50 MCG/ACT nasal spray once as needed.  05/08/14   [provider]  ibuprofen (ADVIL,MOTRIN) 800 MG tablet ibuprofen 800 mg tablet    [provider]  ketorolac (TORADOL) 10 MG tablet ketorolac 10 mg tablet    [provider]  metaxalone (SKELAXIN) 800 MG tablet metaxalone 800 mg tablet    [provider]  montelukast (SINGULAIR) 10 MG tablet Take by mouth. 08/20/15   [provider]  Multiple Vitamin (MULTI-VITAMINS) TABS Take by mouth.    [provider]  nitrofurantoin, macrocrystal-monohydrate, (MACROBID) 100 MG capsule take 1 capsule by mouth twice a day with food 06/07/16   [provider]  omeprazole (PRILOSEC) 40 MG capsule Take 1 capsule (40 mg total) by mouth daily. 08/17/16 10/17/16  [name], MD  ondansetron (ZOFRAN-ODT) 4 MG disintegrating tablet Take by mouth. 12/01/14   [provider]  pantoprazole (PROTONIX) 40 MG tablet Take by mouth. 10/14/15   [provider]  phentermine (ADIPEX-P) 37.5 MG tablet phentermine 37.5 mg tablet  take 1 tablet by mouth every morning    [provider]  prednisoLONE (PRELONE) 15 MG/5ML SOLN once as needed.  07/09/14   [provider]  prednisoLONE sodium phosphate (PEDIAPRED) 6.7 (5 Base) MG/5ML SOLN prednisolone 5 mg/5 mL oral solution  Take 10 mL every day by oral route for 6 days.    [provider]  sucralfate (CARAFATE) 1 g tablet Take by mouth. 10/14/15   [provider]  sulfamethoxazole-trimethoprim (BACTRIM DS,SEPTRA DS) 800-160 MG tablet sulfamethoxazole 800 mg-trimethoprim 160 mg tablet    [provider]  tobramycin (TOBREX) 0.3 % ophthalmic solution instill 1-2 drops into affected eye twice a day 07/07/16   [provider]  traMADol (ULTRAM) 50 MG tablet take 2 tablets by mouth every 8 hours if needed 06/01/16   [provider]  vitamin B-12 (CYANOCOBALAMIN) 1000 MCG tablet Take by mouth.    [provider]    Allergies Cephalexin; Fexofenadine; Ketorolac; Morphine; Phenazopyridine; Promethazine; Contrast media [iodinated diagnostic agents]; Eggs or egg-derived products; Latex; Morphine and related; Other; Pyridium [phenazopyridine hcl]; Tramadol; Vioxx [rofecoxib]; Hepatitis b virus vaccine; Naproxen;  Oxycodone-acetaminophen; and Sulfa antibiotics  Family History  Problem Relation Age of Onset  . Breast cancer Mother 70  . Breast cancer Cousin 85       3 mat cousins -2 alive    Social History Social History  Substance Use Topics  . Smoking status: Never Smoker  . Smokeless tobacco: Never Used  . Alcohol use No    Review of Systems Constitutional: No fever/chills Eyes: No visual changes. ENT: No sore throat. Cardiovascular: Denies chest pain. Respiratory: Denies shortness of breath. Gastrointestinal: no vomiting.  No diarrhea.  No constipation. Genitourinary: Negative for dysuria.see history of present illness. report she has an IUD Musculoskeletal: Negative for back pain.denies pregnancy. Denies vaginal symptoms or discharge. Skin: Negative for rash. Neurological: Negative for headaches, focal weakness or numbness.    ____________________________________________   PHYSICAL EXAM:  VITAL SIGNS: ED Triage Vitals  Enc Vitals Group     BP 10/02/16 1712 112/69     Pulse Rate 10/02/16 1712 92     Resp 10/02/16 1712 18     Temp 10/02/16 1712 98.7 F (37.1 C)  Temp Source 10/02/16 1712 Oral     SpO2 10/02/16 1712 99 %     Weight 10/02/16 1710 188 lb (85.3 kg)     Height --      Head Circumference --      Peak Flow --      Pain Score 10/02/16 1810 10     Pain Loc --      Pain Edu? --      Excl. in [place]? --     Constitutional: Alert and oriented. Well appearing and in no acute distress. Eyes: Conjunctivae are normal. Head: Atraumatic. Nose: No congestion/rhinnorhea. Mouth/Throat: Mucous membranes are moist. Neck: No stridor.   Cardiovascular: Normal rate, regular rhythm. Grossly normal heart sounds.  Good peripheral circulation. Respiratory: Normal respiratory effort.  No retractions. Lungs CTAB. Gastrointestinal: Soft and nontenderexcept for mild tenderness in the left flank with mild to moderate left CVA tenderness. No distention. Musculoskeletal: No lower  extremity tenderness nor edema. Neurologic:  Normal speech and language. No gross focal neurologic deficits are appreciated.  Skin:  Skin is warm, dry and intact. No rash noted. genitourinary: External pelvic exam is normal. internal exam demonstrates slight whitish discharge without tenderness. The os is difficult to visualize, the IUD strings are not noted. there is no cervical motion or adnexal tenderness. escorted and performed with nurse April Psychiatric: Mood and affect are normal. Speech and behavior are normal.  ____________________________________________   LABS (all labs ordered are listed, but only abnormal results are displayed)  Labs Reviewed  URINALYSIS, COMPLETE (UACMP) WITH MICROSCOPIC - Abnormal; Notable for the following:       Result Value   Color, Urine YELLOW (*)    APPearance HAZY (*)    Glucose, UA 50 (*)    Hgb urine dipstick SMALL (*)    Ketones, ur 5 (*)    Bacteria, UA RARE (*)    Squamous Epithelial / LPF 6-30 (*)    All other components within normal limits  BASIC METABOLIC PANEL - Abnormal; Notable for the following:    CO2 20 (*)    Glucose, Bld 196 (*)    All other components within normal limits  URINE CULTURE  WET PREP, GENITAL  CHLAMYDIA/NGC RT PCR ([facility] ONLY)  CBC  PREGNANCY, URINE   ____________________________________________  EKG   ____________________________________________  RADIOLOGY  Ct Abdomen Pelvis Wo Contrast  Result Date: 10/02/2016 CLINICAL DATA:  Left-sided flank pain history of gastric bypass EXAM: CT ABDOMEN AND PELVIS WITHOUT CONTRAST TECHNIQUE: Multidetector CT imaging of the abdomen and pelvis was performed following the standard protocol without IV contrast. COMPARISON:  05/23/2016 FINDINGS: Lower chest: Lung bases demonstrate mild dependent atelectasis. No acute consolidation or effusion. Normal heart size. Hepatobiliary: No focal liver abnormality is seen. Status post cholecystectomy. No biliary  dilatation. Pancreas: Unremarkable. No pancreatic ductal dilatation or surrounding inflammatory changes. Spleen: Normal in size without focal abnormality. Adrenals/Urinary Tract: Adrenal glands are within normal limits. Punctate nonobstructing stone in the mid right kidney. Prominent left extrarenal pelvis but no frank hydronephrosis. No definitive stones seen along the course of left ureter. Bladder unremarkable. Stomach/Bowel: Status post gastric bypass. No evidence for a bowel obstruction. No colon wall thickening. Appendix within normal limits. Vascular/Lymphatic: No significant vascular findings are present. No enlarged abdominal or pelvic lymph nodes. Reproductive: Intrauterine device.  No adnexal masses. Other: Negative for free air or free fluid. Small fat in the umbilicus Musculoskeletal: No acute or significant osseous findings. IMPRESSION: 1. Punctate nonobstructing stone in the mid right  kidney. Prominent left extrarenal pelvis but no left hydronephrosis or evidence for obstructing left ureteral stone. 2. Status post gastric bypass.  Negative for a bowel obstruction. Electronically Signed   By: Donavan Foil M.D.   On: 10/02/2016 20:47    ____________________________________________   PROCEDURES  Procedure(s) performed: None  Procedures  Critical Care performed: No  ____________________________________________   INITIAL IMPRESSION / ASSESSMENT AND PLAN / ED COURSE  Pertinent labs & imaging results that were available during my care of the patient were reviewed by me and considered in my medical decision making (see chart for details).  Differential diagnosis includes but is not limited to, abdominal perforation, aortic dissection, cholecystitis, appendicitis, diverticulitis, colitis, esophagitis/gastritis, kidney stone, pyelonephritis, urinary tract infection, aortic aneurysm. All are considered in decision and treatment plan. Based upon the patient's presentation and risk factors,  an associated left flank to chew with a history of kidney stones proceed with CT scan but with oral contrast given her history of bariatric surgery. She does report an IV iodine allergy that causes itching and discomfort, and we discussed in feel that the risk of IV contrast versus the benefit is marginal at this point will proceed without IV contrast.   patient reports that she cannot tolerate morphine or any of the narcotic pain medicines as they make her feel very sick. She said prior hives with IV contrast. She reports the tramadol is the strongest medication that she can have, and wishes to have some Tylenol here. She cannot take, profound her gastric bypass. Discussed with her, and given her history of gastric bypass with ongoing left flank pain I will obtain a CT with oral contrast only to evaluate for pathology that could be responsible for left flank pain.    ----------------------------------------- 9:13 PM on 10/02/2016 -----------------------------------------  Carefully discussed with the patient pain medications, she reports she cannot tolerate hydrocodone, oxycodone, ordering of NSAIDs. She only knows that she can tolerate tramadol and reports she cannot have morphine or Dilaudid or other associated narcotics. She continues to have moderate pain, reports she does not wish for any additional pain medicine right now she continues to look uncomfortable. Further discussed given the nature and ongoing discomfort in her left lower quadrant discomfort we'll proceed with pelvic exam and ultrasound to evaluate for possible gynecologic process to my inclination is to suspect that she may be passing a small kidney stone given the hemoglobin noted on urinalysis that may not be clinically evident on CT. She does report that she has not had any vaginal discharge but is unsure if the pain in the left lower abdomen could be from her ovary. my pretest probability for ovarian torsion is low, especially given  report of left flank and back pain it's been ongoing for a weeks' time now. However, we'll proceed with further evaluation  ----------------------------------------- 9:37 PM on 10/02/2016 -----------------------------------------  ongoing care assigned to Dr. Quentin Cornwall. Follow-up on pelvic ultrasound, wet prep and GC chlamydia is pending however no cervical motion tenderness was elicited. Low pretest probability for sexual transmitted infection.  follow-up transvaginal ultrasound, evaluate for ovarian pathology. Patient reports having an IUD, assure her IUD is present as well.  ____________________________________________   FINAL CLINICAL IMPRESSION(S) / ED DIAGNOSES  Final diagnoses:  Flank pain      NEW MEDICATIONS STARTED DURING THIS VISIT:  New Prescriptions   No medications on file     Note:  This document was prepared using Dragon voice recognition software and may include unintentional dictation errors.  Delman Kitten, MD 10/02/16 2139

## 2016-10-02 NOTE — ED Triage Notes (Signed)
Pt came to ed via pov, c/o left sided flank pain. Was told she had a kidney stone and was told to come to ED. Was seen at [facility] yesterday, given tramadol and cippro.

## 2016-10-02 NOTE — ED Provider Notes (Signed)
Patient received in sign-out from Dr. Jacqualine Code.  Workup and evaluation pending upon ultrasound as well as wet prep.  results are reassuring. Patient remains hemodynamic stable. Patient stable and appropriate for outpatient follow-up.      Merlyn Lot, MD 10/02/16 2330

## 2016-10-02 NOTE — ED Notes (Signed)
Dr. Jacqualine Code in to speak with pt.

## 2016-10-02 NOTE — ED Notes (Signed)
Pt to ct scan.

## 2016-10-02 NOTE — ED Notes (Signed)
Report to iris, rn. Pt to ultrasound.

## 2016-10-04 ENCOUNTER — Ambulatory Visit: Payer: BC Managed Care – PPO | Admitting: Anesthesiology

## 2016-10-04 ENCOUNTER — Encounter
Admission: RE | Disposition: A | Discharge status: Home / Self Care | Payer: Self-pay | Source: Ambulatory Visit | Attending: Gastroenterology

## 2016-10-04 ENCOUNTER — Ambulatory Visit
Admission: RE | Admit: 2016-10-04 | Discharge: 2016-10-04 | Disposition: A | Discharge status: Home / Self Care | Payer: BC Managed Care – PPO | Source: Ambulatory Visit | Attending: Gastroenterology | Admitting: Gastroenterology

## 2016-10-04 ENCOUNTER — Encounter: Payer: Self-pay | Admitting: *Deleted

## 2016-10-04 DIAGNOSIS — Z91041 Radiographic dye allergy status: Secondary | ICD-10-CM | POA: Insufficient documentation

## 2016-10-04 DIAGNOSIS — Z91040 Latex allergy status: Secondary | ICD-10-CM | POA: Insufficient documentation

## 2016-10-04 DIAGNOSIS — Z888 Allergy status to other drugs, medicaments and biological substances status: Secondary | ICD-10-CM | POA: Insufficient documentation

## 2016-10-04 DIAGNOSIS — Z86711 Personal history of pulmonary embolism: Secondary | ICD-10-CM | POA: Insufficient documentation

## 2016-10-04 DIAGNOSIS — Z882 Allergy status to sulfonamides status: Secondary | ICD-10-CM | POA: Diagnosis not present

## 2016-10-04 DIAGNOSIS — J45909 Unspecified asthma, uncomplicated: Secondary | ICD-10-CM | POA: Insufficient documentation

## 2016-10-04 DIAGNOSIS — Z79899 Other long term (current) drug therapy: Secondary | ICD-10-CM | POA: Diagnosis not present

## 2016-10-04 DIAGNOSIS — R1012 Left upper quadrant pain: Secondary | ICD-10-CM | POA: Diagnosis not present

## 2016-10-04 DIAGNOSIS — Z980 Intestinal bypass and anastomosis status: Secondary | ICD-10-CM | POA: Diagnosis not present

## 2016-10-04 DIAGNOSIS — K2940 Chronic atrophic gastritis without bleeding: Secondary | ICD-10-CM | POA: Diagnosis not present

## 2016-10-04 DIAGNOSIS — Z791 Long term (current) use of non-steroidal anti-inflammatories (NSAID): Secondary | ICD-10-CM | POA: Insufficient documentation

## 2016-10-04 DIAGNOSIS — R1013 Epigastric pain: Secondary | ICD-10-CM | POA: Diagnosis present

## 2016-10-04 HISTORY — PX: ESOPHAGOGASTRODUODENOSCOPY (EGD) WITH PROPOFOL: SHX5813

## 2016-10-04 LAB — URINE CULTURE
Culture: NO GROWTH
Special Requests: NORMAL

## 2016-10-04 SURGERY — ESOPHAGOGASTRODUODENOSCOPY (EGD) WITH PROPOFOL
Anesthesia: General

## 2016-10-04 MED ORDER — LIDOCAINE HCL (PF) 2 % IJ SOLN
INTRAMUSCULAR | Status: AC
  Filled 2016-10-04: qty 2

## 2016-10-04 MED ORDER — PROPOFOL 10 MG/ML IV BOLUS
INTRAVENOUS | Status: DC | PRN
  Administered 2016-10-04: 40 mg via INTRAVENOUS
  Administered 2016-10-04: 30 mg via INTRAVENOUS

## 2016-10-04 MED ORDER — SODIUM CHLORIDE 0.9 % IV SOLN
INTRAVENOUS | Status: DC
  Administered 2016-10-04: 1000 mL via INTRAVENOUS

## 2016-10-04 MED ORDER — PROPOFOL 10 MG/ML IV BOLUS
INTRAVENOUS | Status: AC
Start: 2016-10-04 — End: 2016-10-04
  Filled 2016-10-04: qty 20

## 2016-10-04 MED ORDER — PROPOFOL 500 MG/50ML IV EMUL
INTRAVENOUS | Status: DC | PRN
  Administered 2016-10-04: 120 ug/kg/min via INTRAVENOUS

## 2016-10-04 NOTE — Anesthesia Preprocedure Evaluation (Signed)
Anesthesia Evaluation  Patient identified by MRN, date of birth, ID band Patient awake    Reviewed: Allergy & Precautions, NPO status , Patient's Chart, lab work & pertinent test results  Airway Mallampati: I       Dental  (+) Teeth Intact   Pulmonary asthma , sleep apnea , PE    + decreased breath sounds      Cardiovascular Exercise Tolerance: Good  Rhythm:Regular     Neuro/Psych negative neurological ROS  negative psych ROS   GI/Hepatic Neg liver ROS, GERD  Medicated,  Endo/Other    Renal/GU negative Renal ROS     Musculoskeletal   Abdominal (+) + obese,   Peds negative pediatric ROS (+)  Hematology negative hematology ROS (+)   Anesthesia Other Findings   Reproductive/Obstetrics                             Anesthesia Physical Anesthesia Plan  ASA: II  Anesthesia Plan: General   Post-op Pain Management:    Induction: Intravenous  PONV Risk Score and Plan: 0  Airway Management Planned: Natural Airway and Nasal Cannula  Additional Equipment:   Intra-op Plan:   Post-operative Plan:   Informed Consent: I have reviewed the patients History and Physical, chart, labs and discussed the procedure including the risks, benefits and alternatives for the proposed anesthesia with the patient or authorized representative who has indicated his/her understanding and acceptance.     Plan Discussed with: Surgeon  Anesthesia Plan Comments:         Anesthesia Quick Evaluation

## 2016-10-04 NOTE — Op Note (Signed)
[facility] Gastroenterology Patient Name: Debbie Sosa Procedure Date: 10/04/2016 10:06 AM MRN: [id number] Account #: [id number] Date of Birth: May 30, 1975 Admit Type: Outpatient Age: 41 Room: [facility] ENDO ROOM 1 Gender: Female Note Status: Finalized Procedure:            Upper GI endoscopy Indications:          Epigastric abdominal pain Providers:            [name] MD, MD Referring MD:         No Local Md, MD (Referring MD) Medicines:            Monitored Anesthesia Care Complications:        No immediate complications. Procedure:            Pre-Anesthesia Assessment:                       - Prior to the procedure, a History and Physical was                        performed, and patient medications, allergies and                        sensitivities were reviewed. The patient's tolerance of                        previous anesthesia was reviewed.                       - The risks and benefits of the procedure and the                        sedation options and risks were discussed with the                        patient. All questions were answered and informed                        consent was obtained.                       - ASA Grade Assessment: II - A patient with mild                        systemic disease.                       After obtaining informed consent, the endoscope was                        passed under direct vision. Throughout the procedure,                        the patient's blood pressure, pulse, and oxygen                        saturations were monitored continuously. The Endoscope                        was introduced through the mouth, and advanced to the  third part of duodenum. The upper GI endoscopy was                        accomplished with ease. The patient tolerated the                        procedure well. Findings:      The examined jejunum was normal.      The esophagus was normal.  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 This was       characterized by healthy appearing mucosa. Biopsies were taken with a       cold forceps for histology. Stomach pouch extendingfrom 35 cm to 40 cm Impression:           - Normal examined jejunum.                       - Normal esophagus.                       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 Biopsied. Recommendation:       - Discharge patient to home (with escort).                       - Resume previous diet.                       - Continue present medications.                       - Await pathology results.                       - Return to my office in 8 weeks. Procedure Code(s):    --- Professional ---                       [phone], Esophagogastroduodenoscopy, flexible, transoral;                        with biopsy, single or multiple Diagnosis Code(s):    --- Professional ---                       Z98.84, Bariatric surgery status                       R10.13, Epigastric pain CPT copyright 2016 [employer]. All rights reserved. The codes documented in this report are preliminary and upon coder review may  be revised to meet current compliance requirements. [name], MD [name] MD, MD 10/04/2016 10:19:14 AM This report has been signed electronically. Number of Addenda: 0 Note Initiated On: 10/04/2016 10:06 AM      [facility]

## 2016-10-04 NOTE — H&P (Signed)
Debbie Bellows MD [address], [place], [place] [address] Phone: [phone] Fax : [phone]  Primary Care Physician:  Patient, No Pcp Per Primary Gastroenterologist:  Dr. Jonathon Sosa   Pre-Procedure History & Physical: HPI:  Debbie Sosa is a 41 y.o. female is here for an endoscopy.   Past Medical History:  Diagnosis Date  . Asthma   . Pulmonary embolism [facility])     Past Surgical History:  Procedure Laterality Date  . BREAST BIOPSY Left 2006   benign/clip  . BREAST BIOPSY Right 2015   benign/clip  . BREAST REDUCTION SURGERY    . CESAREAN SECTION    . CESAREAN SECTION    . CHOLECYSTECTOMY    . GASTRIC BYPASS     01/2015  . KNEE SURGERY    . PLANTAR FASCIA SURGERY     x 3  . REDUCTION MAMMAPLASTY Bilateral 2005    Prior to Admission medications   Medication Sig Start Date End Date Taking? Authorizing Provider  Calcium 250 MG CAPS Take by mouth.   Yes [provider]  albuterol (PROVENTIL HFA;VENTOLIN HFA) 108 (90 Base) MCG/ACT inhaler Inhale 1-2 puffs INH Q4-6hr prn for chest tightness, cough, wheezing, SOB/DOE. 03/16/15   [provider]  benzonatate (TESSALON) 200 MG capsule take 1 capsule by mouth twice a day if needed 07/06/16   [provider]  EPINEPHrine (EPIPEN 2-PAK) 0.3 mg/0.3 mL IJ SOAJ injection EpiPen 2-Pak 0.3 mg/0.3 mL injection, auto-injector    [provider]  fluticasone (FLONASE) 50 MCG/ACT nasal spray once as needed.  05/08/14   [provider]  ibuprofen (ADVIL,MOTRIN) 800 MG tablet ibuprofen 800 mg tablet    [provider]  montelukast (SINGULAIR) 10 MG tablet Take by mouth. 08/20/15   [provider]  Multiple Vitamin (MULTI-VITAMINS) TABS Take by mouth.    [provider]  omeprazole (PRILOSEC) 40 MG capsule Take 1 capsule (40 mg total) by mouth daily. 08/17/16 10/17/16  Debbie Bellows, MD  prednisoLONE sodium phosphate (PEDIAPRED) 6.7 (5 Base) MG/5ML SOLN prednisolone 5  mg/5 mL oral solution  Take 10 mL every day by oral route for 6 days.    [provider]  tobramycin (TOBREX) 0.3 % ophthalmic solution instill 1-2 drops into affected eye twice a day 07/07/16   [provider]  traMADol (ULTRAM) 50 MG tablet take 2 tablets by mouth every 8 hours if needed 06/01/16   [provider]  vitamin B-12 (CYANOCOBALAMIN) 1000 MCG tablet Take by mouth.    [provider]    Allergies as of 08/17/2016 - Review Complete 08/17/2016  Allergen Reaction Noted  . Cephalexin Hives and Rash 03/12/2014  . Fexofenadine Shortness Of Breath   . Ketorolac Hives 03/12/2014  . Morphine Anaphylaxis 07/01/2013  . Phenazopyridine Rash   . Promethazine Hives 03/12/2014  . Contrast media [iodinated diagnostic agents] Hives 05/23/2016  . Eggs or egg-derived products  07/01/2013  . Latex  06/18/2014  . Morphine and related  06/18/2014  . Other Hives 02/23/2013  . Pyridium [phenazopyridine hcl]  06/18/2014  . Tramadol  06/18/2014  . Vioxx [rofecoxib]  06/18/2014  . Hepatitis b virus vaccine Rash 07/01/2013  . Naproxen Rash 07/01/2013  . Oxycodone-acetaminophen Itching 03/12/2014  . Sulfa antibiotics Rash 06/18/2014    Family History  Problem Relation Age of Onset  . Breast cancer Mother 73  . Breast cancer Cousin 1       3 mat cousins -2 alive    Social  History   Social History  . Marital status: Single    Spouse name: N/A  . Number of children: N/A  . Years of education: N/A   Occupational History  . Not on file.   Social History Main Topics  . Smoking status: Never Smoker  . Smokeless tobacco: Never Used  . Alcohol use No  . Drug use: No  . Sexual activity: Not on file   Other Topics Concern  . Not on file   Social History Narrative  . No narrative on file    Review of Systems: See HPI, otherwise negative ROS  Physical Exam: BP 102/72   Pulse 70   Temp 97.8 F (36.6 C) (Tympanic)   Ht 5\' 1"  (1.549 m)   Wt 188  lb (85.3 kg)   SpO2 100%   BMI 35.52 kg/m  General:   Alert,  pleasant and cooperative in NAD Head:  Normocephalic and atraumatic. Neck:  Supple; no masses or thyromegaly. Lungs:  Clear throughout to auscultation.    Heart:  Regular rate and rhythm. Abdomen:  Soft, nontender and nondistended. Normal bowel sounds, without guarding, and without rebound.   Neurologic:  Alert and  oriented x4;  grossly normal neurologically.  Impression/Plan: Debbie Sosa is here for an endoscopy to be performed for abdominal pain   Risks, benefits, limitations, and alternatives regarding  endoscopy have been reviewed with the patient.  Questions have been answered.  All parties agreeable.   Debbie Bellows, MD  10/04/2016, 10:02 AM

## 2016-10-04 NOTE — Transfer of Care (Signed)
Immediate Anesthesia Transfer of Care Note  Patient: Debbie Sosa  Procedure(s) Performed: Procedure(s): ESOPHAGOGASTRODUODENOSCOPY (EGD) WITH PROPOFOL (N/A)  Patient Location: PACU  Anesthesia Type:General  Level of Consciousness: awake  Airway & Oxygen Therapy: Patient Spontanous Breathing and Patient connected to nasal cannula oxygen  Post-op Assessment: Report given to RN and Post -op Vital signs reviewed and stable  Post vital signs: Reviewed  Last Vitals:  Vitals:   10/04/16 0920  BP: 102/72  Pulse: 70  Temp: 36.6 C  SpO2: 100%    Last Pain:  Vitals:   10/04/16 0920  TempSrc: Tympanic  PainSc: 0-No pain         Complications: No apparent anesthesia complications

## 2016-10-04 NOTE — Anesthesia Postprocedure Evaluation (Signed)
Anesthesia Post Note  Patient: Debbie Sosa  Procedure(s) Performed: Procedure(s) (LRB): ESOPHAGOGASTRODUODENOSCOPY (EGD) WITH PROPOFOL (N/A)  Patient location during evaluation: PACU Anesthesia Type: General Level of consciousness: awake Pain management: pain level controlled Vital Signs Assessment: post-procedure vital signs reviewed and stable Respiratory status: spontaneous breathing Cardiovascular status: stable Anesthetic complications: no     Last Vitals:  Vitals:   10/04/16 1020 10/04/16 1030  BP: (!) 81/51 (!) 82/64  Pulse: 70 71  Resp:  19  Temp: (!) 36.3 C   SpO2: 98% 100%    Last Pain:  Vitals:   10/04/16 1020  TempSrc: Tympanic  PainSc:                  [name],Debbie Winthrop

## 2016-10-04 NOTE — Anesthesia Post-op Follow-up Note (Signed)
Anesthesia QCDR form completed.        

## 2016-10-05 ENCOUNTER — Encounter: Payer: Self-pay | Admitting: Gastroenterology

## 2016-10-06 LAB — SURGICAL PATHOLOGY

## 2016-10-12 ENCOUNTER — Ambulatory Visit: Payer: BC Managed Care – PPO | Admitting: Gastroenterology

## 2016-10-16 ENCOUNTER — Encounter: Payer: Self-pay | Admitting: Gastroenterology

## 2016-10-17 ENCOUNTER — Encounter: Payer: Self-pay | Admitting: Gastroenterology

## 2016-10-17 ENCOUNTER — Ambulatory Visit (INDEPENDENT_AMBULATORY_CARE_PROVIDER_SITE_OTHER): Payer: BC Managed Care – PPO | Admitting: Gastroenterology

## 2016-10-17 VITALS — BP 104/72 | HR 80 | Temp 97.7°F | Ht 62.0 in | Wt 180.6 lb

## 2016-10-17 DIAGNOSIS — R1012 Left upper quadrant pain: Secondary | ICD-10-CM | POA: Diagnosis not present

## 2016-10-17 NOTE — Progress Notes (Signed)
[name] MD, MRCP(U.K) [address]  [place]  [place], [place] [address]  Main: [phone]  Fax: [phone]   Primary Care Physician: Patient, No Pcp Per  Primary Gastroenterologist:  Dr. [name]   No chief complaint on file.   HPI: Debbie Sosa is a 41 y.o. female   She is here today to follow up for abdominal pain .  Summary of history : She was initially seen at my office on 08/17/16 for abdominal pain. She was seen in the ER on 05/23/16 for LUQ abdominal pain , reproducible , underwent a CT scan of the abdomen which did not show any acute process and has been referred for outpatient evaluation. She underwent  a gastric by pass in 01/2015- Abdominal pains since the surgery , Occurs every few days, usually between 5-15 mins after eating ,Gall bladder surgery: resected in 2000, pain better after a bowel movement . At times the pain can last for hours and at times for a few days . Her primary care doctor Dr Fredonia Highland- gave her miralax and didn't help.Gas/Bloating/Abdominal distension when she eats green leafy veggies and gets abdominal pain.  Labs in 05/2016- CBCD, CMP- normal . Since the ER visit doing well , sometimes has abdominal pains.    Interval history   08/31/2016-  10/17/2016   10/04/16- EGD- Normal Roux en y anatomy seen . 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 Denies any use of NSAID's. Pain is better since last visit. Not taken her PPI since her procedure as she forgot.    Current Outpatient Prescriptions  Medication Sig Dispense Refill  . tamsulosin (FLOMAX) 0.4 MG CAPS capsule Take by mouth.    Marland Kitchen albuterol (PROVENTIL HFA;VENTOLIN HFA) 108 (90 Base) MCG/ACT inhaler Inhale 1-2 puffs INH Q4-6hr prn for chest tightness, cough, wheezing, SOB/DOE.    . benzonatate (TESSALON) 200 MG capsule take 1 capsule by mouth twice a day if needed  0  . Calcium 250 MG CAPS Take by mouth.    .  EPINEPHrine (EPIPEN 2-PAK) 0.3 mg/0.3 mL IJ SOAJ injection EpiPen 2-Pak 0.3 mg/0.3 mL injection, auto-injector    . fluticasone (FLONASE) 50 MCG/ACT nasal spray once as needed.     Marland Kitchen HYDROcodone-acetaminophen (HYCET) 7.5-325 mg/15 ml solution     . ibuprofen (ADVIL,MOTRIN) 100 MG/5ML suspension take 20 milliliters by mouth every 6 hours if needed for pain  0  . ibuprofen (ADVIL,MOTRIN) 800 MG tablet ibuprofen 800 mg tablet    . montelukast (SINGULAIR) 10 MG tablet Take by mouth.    . Multiple Vitamin (MULTI-VITAMINS) TABS Take by mouth.    Marland Kitchen omeprazole (PRILOSEC) 40 MG capsule Take 1 capsule (40 mg total) by mouth daily. 30 capsule 2  . prednisoLONE sodium phosphate (PEDIAPRED) 6.7 (5 Base) MG/5ML SOLN prednisolone 5 mg/5 mL oral solution  Take 10 mL every day by oral route for 6 days.    Marland Kitchen tobramycin (TOBREX) 0.3 % ophthalmic solution instill 1-2 drops into affected eye twice a day  0  . traMADol (ULTRAM) 50 MG tablet take 2 tablets by mouth every 8 hours if needed  0  . vitamin B-12 (CYANOCOBALAMIN) 1000 MCG tablet Take by mouth.     No current facility-administered medications for this visit.     Allergies as of 10/17/2016 - Review Complete 10/04/2016  Allergen Reaction Noted  . Cephalexin Hives and Rash 03/12/2014  . Fexofenadine Shortness Of  Breath   . Ketorolac Hives 03/12/2014  . Morphine Anaphylaxis 07/01/2013  . Phenazopyridine Rash   . Promethazine Hives 03/12/2014  . Contrast media [iodinated diagnostic agents] Hives 05/23/2016  . Eggs or egg-derived products  07/01/2013  . Latex  06/18/2014  . Morphine and related  06/18/2014  . Other Hives 02/23/2013  . Pyridium [phenazopyridine hcl]  06/18/2014  . Tramadol  06/18/2014  . Vioxx [rofecoxib]  06/18/2014  . Hepatitis b virus vaccine Rash 07/01/2013  . Naproxen Rash 07/01/2013  . Oxycodone-acetaminophen Itching 03/12/2014  . Sulfa antibiotics Rash 06/18/2014    ROS:  General: Negative for anorexia, weight loss,  fever, chills, fatigue, weakness. ENT: Negative for hoarseness, difficulty swallowing , nasal congestion. CV: Negative for chest pain, angina, palpitations, dyspnea on exertion, peripheral edema.  Respiratory: Negative for dyspnea at rest, dyspnea on exertion, cough, sputum, wheezing.  GI: See history of present illness. GU:  Negative for dysuria, hematuria, urinary incontinence, urinary frequency, nocturnal urination.  Endo: Negative for unusual weight change.    Physical Examination:   There were no vitals taken for this visit.  General: Well-nourished, well-developed in no acute distress.  Eyes: No icterus. Conjunctivae pink. Mouth: Oropharyngeal mucosa moist and pink , no lesions erythema or exudate. Lungs: Clear to auscultation bilaterally. Non-labored. Heart: Regular rate and rhythm, no murmurs rubs or gallops.  Abdomen: Bowel sounds are normal, nontender, nondistended, no hepatosplenomegaly or masses, no abdominal bruits or hernia , no rebound or guarding.   Extremities: No lower extremity edema. No clubbing or deformities. Neuro: Alert and oriented x 3.  Grossly intact. Skin: Warm and dry, no jaundice.   Psych: Alert and cooperative, normal mood and affect.   Imaging Studies: Ct Abdomen Pelvis Wo Contrast  Result Date: 10/02/2016 CLINICAL DATA:  Left-sided flank pain history of gastric bypass EXAM: CT ABDOMEN AND PELVIS WITHOUT CONTRAST TECHNIQUE: Multidetector CT imaging of the abdomen and pelvis was performed following the standard protocol without IV contrast. COMPARISON:  05/23/2016 FINDINGS: Lower chest: Lung bases demonstrate mild dependent atelectasis. No acute consolidation or effusion. Normal heart size. Hepatobiliary: No focal liver abnormality is seen. Status post cholecystectomy. No biliary dilatation. Pancreas: Unremarkable. No pancreatic ductal dilatation or surrounding inflammatory changes. Spleen: Normal in size without focal abnormality. Adrenals/Urinary Tract:  Adrenal glands are within normal limits. Punctate nonobstructing stone in the mid right kidney. Prominent left extrarenal pelvis but no frank hydronephrosis. No definitive stones seen along the course of left ureter. Bladder unremarkable. Stomach/Bowel: Status post gastric bypass. No evidence for a bowel obstruction. No colon wall thickening. Appendix within normal limits. Vascular/Lymphatic: No significant vascular findings are present. No enlarged abdominal or pelvic lymph nodes. Reproductive: Intrauterine device.  No adnexal masses. Other: Negative for free air or free fluid. Small fat in the umbilicus Musculoskeletal: No acute or significant osseous findings. IMPRESSION: 1. Punctate nonobstructing stone in the mid right kidney. Prominent left extrarenal pelvis but no left hydronephrosis or evidence for obstructing left ureteral stone. 2. Status post gastric bypass.  Negative for a bowel obstruction. Electronically Signed   By: Donavan Foil M.D.   On: 10/02/2016 20:47   US Transvaginal Non-ob  Result Date: 10/02/2016 CLINICAL DATA:  Left adnexal pain for 1 week. Intrauterine device. History of C-section. Negative urine pregnancy test. EXAM: TRANSABDOMINAL AND TRANSVAGINAL ULTRASOUND OF PELVIS DOPPLER ULTRASOUND OF OVARIES TECHNIQUE: Both transabdominal and transvaginal ultrasound examinations of the pelvis were performed. Transabdominal technique was performed for global imaging of the pelvis including uterus, ovaries, adnexal regions, and  pelvic cul-de-sac. It was necessary to proceed with endovaginal exam following the transabdominal exam to visualize the uterus and ovaries. Color and duplex Doppler ultrasound was utilized to evaluate blood flow to the ovaries. COMPARISON:  CT abdomen and pelvis 10/02/2016 FINDINGS: Uterus Measurements: 7 x 4.3 x 4.8 cm. Uterus is retroverted. No fibroids or other mass visualized. Endometrium Echogenic stripe consistent with intrauterine device is present and appears to be  appropriately located within the endometrium. Visualization is limited due to body habitus. There may be a tiny amount of fluid adjacent to the IUD focally. Right ovary Measurements: 3.8 x 2 x 1.7 cm. Normal appearance/no adnexal mass. Left ovary Measurements: 3.3 x 2.2 x 2.2 cm. Normal appearance/no adnexal mass. Pulsed Doppler evaluation of both ovaries demonstrates normal low-resistance arterial and venous waveforms. Other findings Minimal free fluid in the pelvis. Note that the examination is technically limited due to patient body habitus, patient pain, and limited patient mobility. IMPRESSION: Intrauterine device appears to be properly placed. Uterus and ovaries appear unremarkable. No ovarian mass or torsion is identified. Examination is technically limited as above. Electronically Signed   By: Lucienne Capers M.D.   On: 10/02/2016 22:49   US Pelvis Complete  Result Date: 10/02/2016 CLINICAL DATA:  Left adnexal pain for 1 week. Intrauterine device. History of C-section. Negative urine pregnancy test. EXAM: TRANSABDOMINAL AND TRANSVAGINAL ULTRASOUND OF PELVIS DOPPLER ULTRASOUND OF OVARIES TECHNIQUE: Both transabdominal and transvaginal ultrasound examinations of the pelvis were performed. Transabdominal technique was performed for global imaging of the pelvis including uterus, ovaries, adnexal regions, and pelvic cul-de-sac. It was necessary to proceed with endovaginal exam following the transabdominal exam to visualize the uterus and ovaries. Color and duplex Doppler ultrasound was utilized to evaluate blood flow to the ovaries. COMPARISON:  CT abdomen and pelvis 10/02/2016 FINDINGS: Uterus Measurements: 7 x 4.3 x 4.8 cm. Uterus is retroverted. No fibroids or other mass visualized. Endometrium Echogenic stripe consistent with intrauterine device is present and appears to be appropriately located within the endometrium. Visualization is limited due to body habitus. There may be a tiny amount of fluid  adjacent to the IUD focally. Right ovary Measurements: 3.8 x 2 x 1.7 cm. Normal appearance/no adnexal mass. Left ovary Measurements: 3.3 x 2.2 x 2.2 cm. Normal appearance/no adnexal mass. Pulsed Doppler evaluation of both ovaries demonstrates normal low-resistance arterial and venous waveforms. Other findings Minimal free fluid in the pelvis. Note that the examination is technically limited due to patient body habitus, patient pain, and limited patient mobility. IMPRESSION: Intrauterine device appears to be properly placed. Uterus and ovaries appear unremarkable. No ovarian mass or torsion is identified. Examination is technically limited as above. Electronically Signed   By: Lucienne Capers M.D.   On: 10/02/2016 22:49   [place] Art/ven Flow Abd Pelv Doppler  Result Date: 10/02/2016 CLINICAL DATA:  Left adnexal pain for 1 week. Intrauterine device. History of C-section. Negative urine pregnancy test. EXAM: TRANSABDOMINAL AND TRANSVAGINAL ULTRASOUND OF PELVIS DOPPLER ULTRASOUND OF OVARIES TECHNIQUE: Both transabdominal and transvaginal ultrasound examinations of the pelvis were performed. Transabdominal technique was performed for global imaging of the pelvis including uterus, ovaries, adnexal regions, and pelvic cul-de-sac. It was necessary to proceed with endovaginal exam following the transabdominal exam to visualize the uterus and ovaries. Color and duplex Doppler ultrasound was utilized to evaluate blood flow to the ovaries. COMPARISON:  CT abdomen and pelvis 10/02/2016 FINDINGS: Uterus Measurements: 7 x 4.3 x 4.8 cm. Uterus is retroverted. No fibroids or other mass visualized.  Endometrium Echogenic stripe consistent with intrauterine device is present and appears to be appropriately located within the endometrium. Visualization is limited due to body habitus. There may be a tiny amount of fluid adjacent to the IUD focally. Right ovary Measurements: 3.8 x 2 x 1.7 cm. Normal appearance/no adnexal mass. Left ovary  Measurements: 3.3 x 2.2 x 2.2 cm. Normal appearance/no adnexal mass. Pulsed Doppler evaluation of both ovaries demonstrates normal low-resistance arterial and venous waveforms. Other findings Minimal free fluid in the pelvis. Note that the examination is technically limited due to patient body habitus, patient pain, and limited patient mobility. IMPRESSION: Intrauterine device appears to be properly placed. Uterus and ovaries appear unremarkable. No ovarian mass or torsion is identified. Examination is technically limited as above. Electronically Signed   By: Lucienne Capers M.D.   On: 10/02/2016 22:49    Assessment and Plan:   Debbie Sosa is a 41 y.o. y/o female  here to follow up  for abdominal pain LUQ. S/p gastric bypass. 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 Prilosec 40 mg once a day restart  2. EGD to evaluate for gastric mapping from intestinal metaplasia in 6 months.    I have discussed alternative options, risks & benefits,  which include, but are not limited to, bleeding, infection, perforation,respiratory complication & drug reaction.  The patient agrees with this plan & written consent will be obtained.     Dr [name]  MD,MRCP [facility]) Follow up in 7 months

## 2016-11-07 ENCOUNTER — Encounter: Payer: Self-pay | Admitting: Emergency Medicine

## 2016-11-07 ENCOUNTER — Emergency Department
Admission: EM | Admit: 2016-11-07 | Discharge: 2016-11-07 | Disposition: A | Discharge status: Home / Self Care | Payer: BC Managed Care – PPO | Attending: Emergency Medicine | Admitting: Emergency Medicine

## 2016-11-07 DIAGNOSIS — J45909 Unspecified asthma, uncomplicated: Secondary | ICD-10-CM | POA: Insufficient documentation

## 2016-11-07 DIAGNOSIS — N762 Acute vulvitis: Secondary | ICD-10-CM

## 2016-11-07 DIAGNOSIS — Z86718 Personal history of other venous thrombosis and embolism: Secondary | ICD-10-CM | POA: Diagnosis not present

## 2016-11-07 DIAGNOSIS — Z79899 Other long term (current) drug therapy: Secondary | ICD-10-CM | POA: Diagnosis not present

## 2016-11-07 DIAGNOSIS — N764 Abscess of vulva: Secondary | ICD-10-CM | POA: Diagnosis present

## 2016-11-07 MED ORDER — CLINDAMYCIN PHOSPHATE 600 MG/4ML IJ SOLN
600.0000 mg | Freq: Once | INTRAMUSCULAR | Status: AC
  Administered 2016-11-07: 600 mg via INTRAMUSCULAR
  Filled 2016-11-07: qty 4

## 2016-11-07 MED ORDER — HYDROCODONE-ACETAMINOPHEN 5-325 MG PO TABS: 1.0000 | ORAL_TABLET | ORAL | 0 refills | Status: DC | PRN

## 2016-11-07 MED ORDER — CLINDAMYCIN HCL 300 MG PO CAPS: 300.0000 mg | ORAL_CAPSULE | Freq: Four times a day (QID) | ORAL | 0 refills | Status: DC

## 2016-11-07 NOTE — ED Provider Notes (Signed)
[facility] Emergency Department Provider Note  ____________________________________________  Time seen: Approximately 10:28 PM  I have reviewed the triage vital signs and the nursing notes.   HISTORY  Chief Complaint Abscess    HPI Debbie Sosa is a 41 y.o. female who presents emergency department complaining of a right sided labial abscess. Patient reports that she first noticed a "bump" to the right labia yesterday. Throughout today, she has noticed that it has increased in size and pain. No drainage. Patient tried to "pop" the abscess at home. She reports that she did not get any drainage from same. No history of recurring skin lesions. Patient is allergic to sulfa and Keflex. No medications for this complaint prior to arrival. No other complaints or injury at this time.Patient denies any vaginal bleeding or discharge, dysuria, polyuria, rectal pain.   Past Medical History:  Diagnosis Date  . Asthma   . Pulmonary embolism [facility])     Patient Active Problem List   Diagnosis Date Noted  . Acute laryngitis 08/17/2016  . Dysphagia 08/17/2016  . Meningitis 08/17/2016  . 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 Diarrhea, unspecified 10/14/2015  . Left lower quadrant pain 10/14/2015  . Nausea and vomiting 10/14/2015  . 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  . 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 Achalasia 01/07/2015  . Central sleep apnea 01/07/2015  . Obesity (BMI 30-39.9) 01/07/2015  . GERD (gastroesophageal reflux disease) 12/25/2014  . Pulmonary embolism ([place])   . Plantar fasciitis of right foot 12/31/2013    Past Surgical History:  Procedure Laterality Date  . BREAST BIOPSY Left 2006   benign/clip  . BREAST BIOPSY Right 2015   benign/clip  . BREAST REDUCTION SURGERY    . CESAREAN SECTION    . CESAREAN SECTION    . CHOLECYSTECTOMY    . ESOPHAGOGASTRODUODENOSCOPY (EGD) WITH PROPOFOL N/A 10/04/2016   Procedure: ESOPHAGOGASTRODUODENOSCOPY (EGD) WITH  PROPOFOL;  Surgeon: [name], MD;  Location: [facility] ENDOSCOPY;  Service: Gastroenterology;  Laterality: N/A;  . GASTRIC BYPASS     01/2015  . KNEE SURGERY    . PLANTAR FASCIA SURGERY     x 3  . REDUCTION MAMMAPLASTY Bilateral 2005    Prior to Admission medications   Medication Sig Start Date End Date Taking? Authorizing Provider  albuterol (PROVENTIL HFA;VENTOLIN HFA) 108 (90 Base) MCG/ACT inhaler Inhale 1-2 puffs INH Q4-6hr prn for chest tightness, cough, wheezing, SOB/DOE. 03/16/15   [provider]  benzonatate (TESSALON) 200 MG capsule take 1 capsule by mouth twice a day if needed 07/06/16   [provider]  Calcium 250 MG CAPS Take by mouth.    [provider]  clindamycin (CLEOCIN) 300 MG capsule Take 1 capsule (300 mg total) by mouth 4 (four) times daily. 11/07/16   Cuthriell, Charline Bills, PA-C  EPINEPHrine (EPIPEN 2-PAK) 0.3 mg/0.3 mL IJ SOAJ injection EpiPen 2-Pak 0.3 mg/0.3 mL injection, auto-injector    [provider]  fluticasone (FLONASE) 50 MCG/ACT nasal spray once as needed.  05/08/14   [provider]  HYDROcodone-acetaminophen (NORCO/VICODIN) 5-325 MG tablet Take 1 tablet by mouth every 4 (four) hours as needed for moderate pain. 11/07/16   Cuthriell, Charline Bills, PA-C  montelukast (SINGULAIR) 10 MG tablet Take by mouth. 08/20/15   [provider]  Multiple Vitamin (MULTI-VITAMINS) TABS Take by mouth.    [provider]  omeprazole (PRILOSEC) 40 MG capsule Take 1 capsule (40 mg total) by mouth daily. 08/17/16 10/17/16  [name], MD  prednisoLONE sodium  phosphate (PEDIAPRED) 6.7 (5 Base) MG/5ML SOLN prednisolone 5 mg/5 mL oral solution  Take 10 mL every day by oral route for 6 days.    [provider]  tamsulosin (FLOMAX) 0.4 MG CAPS capsule Take by mouth. 10/01/16 10/01/17  [provider]  tobramycin (TOBREX) 0.3 % ophthalmic solution instill 1-2 drops into affected eye twice a day 07/07/16   [provider]  vitamin B-12 (CYANOCOBALAMIN) 1000 MCG tablet Take by mouth.    [provider]    Allergies Cephalexin; Fexofenadine; Ketorolac; Morphine; Phenazopyridine; Promethazine; Contrast media [iodinated diagnostic agents]; Eggs or egg-derived products; Latex; Morphine and related; Other; Pyridium [phenazopyridine hcl]; Tramadol; Vioxx [rofecoxib]; Hepatitis b virus vaccine; Naproxen; Oxycodone-acetaminophen; and Sulfa antibiotics  Family History  Problem Relation Age of Onset  . Breast cancer Mother 23  . Breast cancer Cousin 49       3 mat cousins -2 alive    Social History Social History  Substance Use Topics  . Smoking status: Never Smoker  . Smokeless tobacco: Never Used  . Alcohol use No     Review of Systems  Constitutional: No fever/chills Eyes: No visual changes. No discharge ENT: No upper respiratory complaints. Cardiovascular: no chest pain. Respiratory: no cough. No SOB. Gastrointestinal: No abdominal pain.  No nausea, no vomiting.  No diarrhea.  No constipation. Genitourinary: Negative for dysuria. No hematuria. Positive for left-sided labial abscess Musculoskeletal: Negative for musculoskeletal pain. Skin: Negative for rash, abrasions, lacerations, ecchymosis. Neurological: Negative for headaches, focal weakness or numbness. 10-point ROS otherwise negative.  ____________________________________________   PHYSICAL EXAM:  VITAL SIGNS: ED Triage Vitals  Enc Vitals Group     BP 11/07/16 2123 (!) 141/89     Pulse Rate 11/07/16 2123 (!) 124     Resp 11/07/16 2123 20     Temp 11/07/16 2123 98.7 F (37.1 C)     Temp Source 11/07/16 2123 Oral     SpO2 11/07/16 2123 96 %     Weight 11/07/16 2124 180 lb (81.6 kg)     Height 11/07/16 2124 5\' 2"  (1.575 m)     Head Circumference --      Peak Flow --      Pain Score 11/07/16 2122 9     Pain Loc --      Pain Edu? --      Excl. in [place]? --      Constitutional: Alert and oriented. Well  appearing and in no acute distress. Eyes: Conjunctivae are normal. PERRL. EOMI. Head: Atraumatic. ENT:      Ears:       Nose: No congestion/rhinnorhea.      Mouth/Throat: Mucous membranes are moist.  Neck: No stridor.    Cardiovascular: Normal rate, regular rhythm. Normal S1 and S2.  Good peripheral circulation. Respiratory: Normal respiratory effort without tachypnea or retractions. Lungs CTAB. Good air entry to the bases with no decreased or absent breath sounds. Gastrointestinal: Bowel sounds 4 quadrants. Soft and nontender to palpation. No guarding or rigidity. No palpable masses. No distention. No CVA tenderness. Genitourinary: External genitalia exam reveals erythematous and edematous lesion noted to the right labia. No drainage noted. Area is very firm to palpation. Very tender to palpation. No induration or fluctuance. Musculoskeletal: Full range of motion to all extremities. No gross deformities appreciated. Neurologic:  Normal speech and language. No gross focal neurologic deficits are appreciated.  Skin:  Skin is warm, dry and intact. No rash noted. Psychiatric: Mood and affect are normal. Speech  and behavior are normal. Patient exhibits appropriate insight and judgement.  External genitalia exam with female RN chaperone present. ____________________________________________   LABS (all labs ordered are listed, but only abnormal results are displayed)  Labs Reviewed - No data to display ____________________________________________  EKG   ____________________________________________  RADIOLOGY   No results found.  ____________________________________________    PROCEDURES  Procedure(s) performed:    Procedures    Medications  clindamycin (CLEOCIN) injection 600 mg (not administered)     ____________________________________________   INITIAL IMPRESSION / ASSESSMENT AND PLAN / ED COURSE  Pertinent labs & imaging results that were available during my  care of the patient were reviewed by me and considered in my medical decision making (see chart for details).  Review of the [place] CSRS was performed in accordance of the [place] prior to dispensing any controlled drugs.     Patient's diagnosis is consistent with cellulitis of the left labia. Differential included abscess versus cellulitis. No induration or fluctuance, pustular drainage noted. At this time, no indication for labs or imaging. No indication for incision and drainage at this time. Patient is allergic to Keflex as well as sulfa antibiotics. As such, patient will be treated with clindamycin. Patient is also given a very limited prescription of Vicodin for pain. She will follow up with primary care as needed..  Patient is given ED precautions to return to the ED for any worsening or new symptoms.     ____________________________________________  FINAL CLINICAL IMPRESSION(S) / ED DIAGNOSES  Final diagnoses:  Cellulitis of labia      NEW MEDICATIONS STARTED DURING THIS VISIT:  New Prescriptions   CLINDAMYCIN (CLEOCIN) 300 MG CAPSULE    Take 1 capsule (300 mg total) by mouth 4 (four) times daily.   HYDROCODONE-ACETAMINOPHEN (NORCO/VICODIN) 5-325 MG TABLET    Take 1 tablet by mouth every 4 (four) hours as needed for moderate pain.        This chart was dictated using voice recognition software/Dragon. Despite best efforts to proofread, errors can occur which can change the meaning. Any change was purely unintentional.    Darletta Moll, PA-C 11/07/16 2310    Arta Silence, MD 11/07/16 2332

## 2016-11-07 NOTE — ED Notes (Signed)
Pt states perineal abscess x 24 hrs. States it has started to increase in size. Denies drainage. Pt is standing at bedside d/t comfort standing.

## 2016-11-07 NOTE — ED Triage Notes (Addendum)
Pt reports "boil" to inner thigh right side reports been there for days today pain increased not able to sit down. Pt talks in complete sentences no respiratory distress noted denies any other symptom at present

## 2016-11-30 ENCOUNTER — Telehealth: Payer: Self-pay | Admitting: Gastroenterology

## 2016-11-30 NOTE — Telephone Encounter (Signed)
Patient called to scheduled repeat EGD for gastric mapping.   Advised patient to callback in 2019 to schedule for April.   Patient requesting the last week of March due to Spring Break.

## 2016-11-30 NOTE — Telephone Encounter (Signed)
Patient LVM that she is ready to schedule her procedure.

## 2017-01-19 ENCOUNTER — Other Ambulatory Visit: Payer: Self-pay

## 2017-01-19 ENCOUNTER — Telehealth: Payer: Self-pay | Admitting: Gastroenterology

## 2017-01-19 DIAGNOSIS — K3189 Other diseases of stomach and duodenum: Secondary | ICD-10-CM

## 2017-01-19 DIAGNOSIS — K31A0 Gastric intestinal metaplasia, unspecified: Secondary | ICD-10-CM

## 2017-01-19 NOTE — Telephone Encounter (Signed)
Patient called and stated she need to schedule another EGD. Please call

## 2017-04-06 ENCOUNTER — Other Ambulatory Visit: Payer: Self-pay | Admitting: Obstetrics and Gynecology

## 2017-04-06 DIAGNOSIS — Z1231 Encounter for screening mammogram for malignant neoplasm of breast: Secondary | ICD-10-CM

## 2017-04-12 ENCOUNTER — Encounter: Payer: Self-pay | Admitting: *Deleted

## 2017-04-14 ENCOUNTER — Ambulatory Visit
Admission: RE | Admit: 2017-04-14 | Discharge: 2017-04-14 | Disposition: A | Discharge status: Home / Self Care | Payer: BC Managed Care – PPO | Source: Ambulatory Visit | Attending: Gastroenterology | Admitting: Gastroenterology

## 2017-04-14 ENCOUNTER — Encounter
Admission: RE | Disposition: A | Discharge status: Home / Self Care | Payer: Self-pay | Source: Ambulatory Visit | Attending: Gastroenterology

## 2017-04-14 ENCOUNTER — Ambulatory Visit: Payer: BC Managed Care – PPO | Admitting: Certified Registered Nurse Anesthetist

## 2017-04-14 DIAGNOSIS — K219 Gastro-esophageal reflux disease without esophagitis: Secondary | ICD-10-CM | POA: Insufficient documentation

## 2017-04-14 DIAGNOSIS — J45909 Unspecified asthma, uncomplicated: Secondary | ICD-10-CM | POA: Diagnosis not present

## 2017-04-14 DIAGNOSIS — Z9884 Bariatric surgery status: Secondary | ICD-10-CM | POA: Diagnosis not present

## 2017-04-14 DIAGNOSIS — Z86711 Personal history of pulmonary embolism: Secondary | ICD-10-CM | POA: Insufficient documentation

## 2017-04-14 DIAGNOSIS — K2940 Chronic atrophic gastritis without bleeding: Secondary | ICD-10-CM | POA: Diagnosis not present

## 2017-04-14 DIAGNOSIS — Z79899 Other long term (current) drug therapy: Secondary | ICD-10-CM | POA: Diagnosis not present

## 2017-04-14 DIAGNOSIS — K3189 Other diseases of stomach and duodenum: Secondary | ICD-10-CM | POA: Diagnosis not present

## 2017-04-14 DIAGNOSIS — K31A0 Gastric intestinal metaplasia, unspecified: Secondary | ICD-10-CM

## 2017-04-14 HISTORY — PX: ESOPHAGOGASTRODUODENOSCOPY (EGD) WITH PROPOFOL: SHX5813

## 2017-04-14 LAB — POCT PREGNANCY, URINE: Preg Test, Ur: NEGATIVE

## 2017-04-14 SURGERY — ESOPHAGOGASTRODUODENOSCOPY (EGD) WITH PROPOFOL
Anesthesia: General

## 2017-04-14 MED ORDER — LIDOCAINE HCL (PF) 2 % IJ SOLN
INTRAMUSCULAR | Status: AC
  Filled 2017-04-14: qty 10

## 2017-04-14 MED ORDER — GLYCOPYRROLATE 0.2 MG/ML IJ SOLN
INTRAMUSCULAR | Status: AC
  Filled 2017-04-14: qty 1

## 2017-04-14 MED ORDER — SODIUM CHLORIDE 0.9 % IV SOLN
INTRAVENOUS | Status: DC
  Administered 2017-04-14: 1000 mL via INTRAVENOUS

## 2017-04-14 MED ORDER — PROPOFOL 500 MG/50ML IV EMUL
INTRAVENOUS | Status: DC | PRN
  Administered 2017-04-14: 160 ug/kg/min via INTRAVENOUS

## 2017-04-14 MED ORDER — PROPOFOL 500 MG/50ML IV EMUL
INTRAVENOUS | Status: AC
  Filled 2017-04-14: qty 50

## 2017-04-14 MED ORDER — PROPOFOL 10 MG/ML IV BOLUS
INTRAVENOUS | Status: DC | PRN
  Administered 2017-04-14: 70 mg via INTRAVENOUS

## 2017-04-14 NOTE — H&P (Signed)
[name], MD [address] [place], [place], [place], [address] [address], [place], [place], [place], [address] Phone: [phone]  Fax: [phone]  Primary Care Physician:  Elgie Collard, MD   Pre-Procedure History & Physical: HPI:  Debbie Sosa is a 42 y.o. female is here for an endoscopy    Past Medical History:  Diagnosis Date  . Asthma   . Pulmonary embolism [facility])     Past Surgical History:  Procedure Laterality Date  . BREAST BIOPSY Left 2006   benign/clip  . BREAST BIOPSY Right 2015   benign/clip  . BREAST REDUCTION SURGERY    . CESAREAN SECTION    . CESAREAN SECTION    . CHOLECYSTECTOMY    . ESOPHAGOGASTRODUODENOSCOPY (EGD) WITH PROPOFOL N/A 10/04/2016   Procedure: ESOPHAGOGASTRODUODENOSCOPY (EGD) WITH PROPOFOL;  Surgeon: [name], MD;  Location: [facility] ENDOSCOPY;  Service: Gastroenterology;  Laterality: N/A;  . GASTRIC BYPASS     01/2015  . KNEE SURGERY    . PLANTAR FASCIA SURGERY     x 3  . REDUCTION MAMMAPLASTY Bilateral 2005    Prior to Admission medications   Medication Sig Start Date End Date Taking? Authorizing Provider  albuterol (PROVENTIL HFA;VENTOLIN HFA) 108 (90 Base) MCG/ACT inhaler Inhale 1-2 puffs INH Q4-6hr prn for chest tightness, cough, wheezing, SOB/DOE. 03/16/15  Yes [provider]  benzonatate (TESSALON) 200 MG capsule take 1 capsule by mouth twice a day if needed 07/06/16  Yes [provider]  Calcium 250 MG CAPS Take by mouth.   Yes [provider]  clindamycin (CLEOCIN) 300 MG capsule Take 1 capsule (300 mg total) by mouth 4 (four) times daily. 11/07/16  Yes Cuthriell, Charline Bills, PA-C  Diclofenac Sodium 3 % GEL Place onto the skin. 12/01/15  Yes [provider]  EPINEPHrine (EPIPEN 2-PAK) 0.3 mg/0.3 mL IJ SOAJ injection EpiPen 2-Pak 0.3 mg/0.3 mL injection, auto-injector   Yes [provider]  fluconazole (DIFLUCAN) 150 MG tablet Take 150 mg at bedtime by mouth.  11/08/16  Yes [provider]  fluticasone (FLONASE) 50 MCG/ACT nasal spray once as needed.  05/08/14  Yes [provider]  HYDROcodone-acetaminophen (NORCO/VICODIN) 5-325 MG tablet Take 1 tablet by mouth every 4 (four) hours as needed for moderate pain. 11/07/16  Yes Cuthriell, Charline Bills, PA-C  montelukast (SINGULAIR) 10 MG tablet Take by mouth. 08/20/15  Yes [provider]  Multiple Vitamin (MULTI-VITAMINS) TABS Take by mouth.   Yes [provider]  pantoprazole (PROTONIX) 40 MG tablet Take by mouth. 10/14/15  Yes [provider]  prednisoLONE sodium phosphate (PEDIAPRED) 6.7 (5 Base) MG/5ML SOLN prednisolone 5 mg/5 mL oral solution  Take 10 mL every day by oral route for 6 days.   Yes [provider]  sucralfate (CARAFATE) 1 g tablet Take by mouth. 10/14/15  Yes [provider]  tamsulosin (FLOMAX) 0.4 MG CAPS capsule Take by mouth. 10/01/16 10/01/17 Yes [provider]  tobramycin (TOBREX) 0.3 % ophthalmic solution instill 1-2 drops into affected eye twice a day 07/07/16  Yes [provider]  vitamin B-12 (CYANOCOBALAMIN) 1000 MCG tablet Take by mouth.   Yes [provider]  omeprazole (PRILOSEC) 40 MG capsule Take 1 capsule (40 mg total) by mouth daily. 08/17/16 10/17/16  [name], MD    Allergies as of 01/19/2017 - Review Complete 10/17/2016  Allergen Reaction Noted  . Cephalexin Hives and Rash 03/12/2014  . Fexofenadine Shortness Of Breath   . Ketorolac  Hives 03/12/2014  . Morphine Anaphylaxis 07/01/2013  . Phenazopyridine Rash   . Promethazine Hives 03/12/2014  . Contrast media [iodinated diagnostic agents] Hives 05/23/2016  . Eggs or egg-derived products  07/01/2013  . Latex  06/18/2014  . Morphine and related  06/18/2014  . Other Hives 02/23/2013  . Pyridium [phenazopyridine hcl]  06/18/2014  . Tramadol  06/18/2014  . Vioxx [rofecoxib]  06/18/2014  . Hepatitis b virus vaccine Rash 07/01/2013    . Naproxen Rash 07/01/2013  . Oxycodone-acetaminophen Itching 03/12/2014  . Sulfa antibiotics Rash 06/18/2014    Family History  Problem Relation Age of Onset  . Breast cancer Mother 35  . Breast cancer Cousin 54       3 mat cousins -2 alive    Social History   Socioeconomic History  . Marital status: Single    Spouse name: Not on file  . Number of children: Not on file  . Years of education: Not on file  . Highest education level: Not on file  Occupational History  . Not on file  Social Needs  . Financial resource strain: Not on file  . Food insecurity:    Worry: Not on file    Inability: Not on file  . Transportation needs:    Medical: Not on file    Non-medical: Not on file  Tobacco Use  . Smoking status: Never Smoker  . Smokeless tobacco: Never Used  Substance and Sexual Activity  . Alcohol use: No  . Drug use: No  . Sexual activity: Not on file  Lifestyle  . Physical activity:    Days per week: Not on file    Minutes per session: Not on file  . Stress: Not on file  Relationships  . Social connections:    Talks on phone: Not on file    Gets together: Not on file    Attends religious service: Not on file    Active member of club or organization: Not on file    Attends meetings of clubs or organizations: Not on file    Relationship status: Not on file  . Intimate partner violence:    Fear of current or ex partner: Not on file    Emotionally abused: Not on file    Physically abused: Not on file    Forced sexual activity: Not on file  Other Topics Concern  . Not on file  Social History Narrative  . Not on file    Review of Systems: See HPI, otherwise negative ROS  Physical Exam: BP 113/69   Pulse 72   Temp (!) 97 F (36.1 C) (Tympanic)   Resp 20   Ht 5\' 2"  (1.575 m)   Wt 182 lb (82.6 kg)   SpO2 100%   BMI 33.29 kg/m  General:   Alert,  pleasant and cooperative in NAD Head:  Normocephalic and atraumatic. Neck:  Supple; no masses or  thyromegaly. Lungs:  Clear throughout to auscultation, normal respiratory effort.    Heart:  +S1, +S2, Regular rate and rhythm, No edema. Abdomen:  Soft, nontender and nondistended. Normal bowel sounds, without guarding, and without rebound.   Neurologic:  Alert and  oriented x4;  grossly normal neurologically.  Impression/Plan: Debbie Sosa is here for an endoscopy  to be performed for  evaluation of intestinal metaplasia     Risks, benefits, limitations, and alternatives regarding endoscopy have been reviewed with the patient.  Questions have been answered.  All parties agreeable.   [name],  MD  04/14/2017, 7:58 AM

## 2017-04-14 NOTE — Op Note (Signed)
[facility] Gastroenterology Patient Name: Debbie Sosa Procedure Date: 04/14/2017 8:00 AM MRN: [id number] Account #: [id number] Date of Birth: 12-09-1975 Admit Type: Outpatient Age: 42 Room: [name] ENDO ROOM 4 Gender: Female Note Status: Finalized Procedure:            Upper GI endoscopy Indications:          Endoscopic therapy of intestinal metaplasia Providers:            [name] MD, MD Referring MD:         Shelby Mattocks. Georga Bora, MD (Referring MD) Medicines:            Monitored Anesthesia Care Complications:        No immediate complications. Procedure:            Pre-Anesthesia Assessment:                       - Prior to the procedure, a History and Physical was                        performed, and patient medications, allergies and                        sensitivities were reviewed. The patient's tolerance of                        previous anesthesia was reviewed.                       - The risks and benefits of the procedure and the                        sedation options and risks were discussed with the                        patient. All questions were answered and informed                        consent was obtained.                       - ASA Grade Assessment: III - A patient with severe                        systemic disease.                       After obtaining informed consent, the endoscope was                        passed under direct vision. Throughout the procedure,                        the patient's blood pressure, pulse, and oxygen                        saturations were monitored continuously. The Endoscope                        was introduced through the mouth, and advanced to the  jejunum. The upper GI endoscopy was accomplished with                        ease. The patient tolerated the procedure well. Findings:      The esophagus was normal.      The examined jejunum was normal.      Evidence of a  gastric bypass was found. A gastric pouch with a small       size was found. The gastrojejunal anastomosis was characterized by       healthy appearing mucosa. This was traversed. The pouch-to-jejunum limb       was characterized by healthy appearing mucosa.      Normal mucosa was found in the entire examined stomach. Biopsies were       taken with a cold forceps for histology. Impression:           - Normal esophagus.                       - Normal examined jejunum.                       - Gastric bypass with a small-sized pouch.                        Gastrojejunal anastomosis characterized by healthy                        appearing mucosa.                       - Normal mucosa was found in the entire stomach.                        Biopsied. Recommendation:       - Discharge patient to home (with escort).                       - Resume previous diet.                       - Continue present medications.                       - Await pathology results. Procedure Code(s):    --- Professional ---                       [phone], Esophagogastroduodenoscopy, flexible, transoral;                        with biopsy, single or multiple Diagnosis Code(s):    --- Professional ---                       Z98.84, Bariatric surgery status                       K31.89, Other diseases of stomach and duodenum CPT copyright 2016 American Medical Association. All rights reserved. The codes documented in this report are preliminary and upon coder review may  be revised to meet current compliance requirements. [name], MD [name] MD, MD 04/14/2017 8:15:10 AM This report has been signed electronically. Number of Addenda: 0 Note Initiated On: 04/14/2017 8:00 AM  [facility]

## 2017-04-14 NOTE — Anesthesia Preprocedure Evaluation (Signed)
Anesthesia Evaluation  Patient identified by MRN, date of birth, ID band Patient awake    Reviewed: Allergy & Precautions, H&P , NPO status , Patient's Chart, lab work & pertinent test results, reviewed documented beta blocker date and time   Airway Mallampati: II   Neck ROM: full    Dental  (+) Poor Dentition, Teeth Intact   Pulmonary neg pulmonary ROS, asthma ,    Pulmonary exam normal        Cardiovascular negative cardio ROS Normal cardiovascular exam Rhythm:regular Rate:Normal     Neuro/Psych negative neurological ROS  negative psych ROS   GI/Hepatic negative GI ROS, Neg liver ROS, GERD  ,  Endo/Other  negative endocrine ROS  Renal/GU negative Renal ROS  negative genitourinary   Musculoskeletal   Abdominal   Peds  Hematology negative hematology ROS (+)   Anesthesia Other Findings Past Medical History: No date: Asthma No date: Pulmonary embolism [facility]) Past Surgical History: 2006: BREAST BIOPSY; Left     Comment:  benign/clip 2015: BREAST BIOPSY; Right     Comment:  benign/clip No date: BREAST REDUCTION SURGERY No date: CESAREAN SECTION No date: CESAREAN SECTION No date: CHOLECYSTECTOMY 10/04/2016: ESOPHAGOGASTRODUODENOSCOPY (EGD) WITH PROPOFOL; N/A     Comment:  Procedure: ESOPHAGOGASTRODUODENOSCOPY (EGD) WITH               PROPOFOL;  Surgeon: [name], MD;  Location: [facility]               ENDOSCOPY;  Service: Gastroenterology;  Laterality: N/A; No date: GASTRIC BYPASS     Comment:  01/2015 No date: KNEE SURGERY No date: PLANTAR FASCIA SURGERY     Comment:  x 3 2005: REDUCTION MAMMAPLASTY; Bilateral   Reproductive/Obstetrics negative OB ROS                             Anesthesia Physical Anesthesia Plan  ASA: III  Anesthesia Plan: General   Post-op Pain Management:    Induction:   PONV Risk Score and Plan:   Airway Management Planned:   Additional Equipment:    Intra-op Plan:   Post-operative Plan:   Informed Consent: I have reviewed the patients History and Physical, chart, labs and discussed the procedure including the risks, benefits and alternatives for the proposed anesthesia with the patient or authorized representative who has indicated his/her understanding and acceptance.   Dental Advisory Given  Plan Discussed with: CRNA  Anesthesia Plan Comments:         Anesthesia Quick Evaluation

## 2017-04-14 NOTE — Anesthesia Post-op Follow-up Note (Signed)
Anesthesia QCDR form completed.        

## 2017-04-14 NOTE — Transfer of Care (Signed)
Immediate Anesthesia Transfer of Care Note  Patient: Debbie Sosa  Procedure(s) Performed: ESOPHAGOGASTRODUODENOSCOPY (EGD) WITH PROPOFOL (N/A )  Patient Location: PACU  Anesthesia Type:General  Level of Consciousness: awake, alert  and oriented  Airway & Oxygen Therapy: Patient Spontanous Breathing and Patient connected to nasal cannula oxygen  Post-op Assessment: Report given to RN and Post -op Vital signs reviewed and stable  Post vital signs: Reviewed and stable  Last Vitals:  Vitals Value Taken Time  BP 110/76 04/14/2017  8:12 AM  Temp    Pulse 88 04/14/2017  8:13 AM  Resp 18 04/14/2017  8:13 AM  SpO2 99 % 04/14/2017  8:13 AM  Vitals shown include unvalidated device data.  Last Pain:  Vitals:   04/14/17 0737  TempSrc: Tympanic         Complications: No apparent anesthesia complications

## 2017-04-14 NOTE — Anesthesia Postprocedure Evaluation (Signed)
Anesthesia Post Note  Patient: Debbie Sosa  Procedure(s) Performed: ESOPHAGOGASTRODUODENOSCOPY (EGD) WITH PROPOFOL (N/A )  Patient location during evaluation: PACU Anesthesia Type: General Level of consciousness: awake and alert Pain management: pain level controlled Vital Signs Assessment: post-procedure vital signs reviewed and stable Respiratory status: spontaneous breathing, nonlabored ventilation, respiratory function stable and patient connected to nasal cannula oxygen Cardiovascular status: blood pressure returned to baseline and stable Postop Assessment: no apparent nausea or vomiting Anesthetic complications: no     Last Vitals:  Vitals:   04/14/17 0840 04/14/17 0850  BP: 119/82 117/82  Pulse: 69 61  Resp: 15 (!) 23  Temp:    SpO2: 100% 100%    Last Pain:  Vitals:   04/14/17 0810  TempSrc: Tympanic                 Molli Barrows

## 2017-04-17 ENCOUNTER — Encounter: Payer: Self-pay | Admitting: Gastroenterology

## 2017-04-18 LAB — SURGICAL PATHOLOGY

## 2017-04-25 ENCOUNTER — Telehealth: Payer: Self-pay | Admitting: Gastroenterology

## 2017-04-25 NOTE — Telephone Encounter (Signed)
Pt left vm for results of biopsy

## 2017-04-27 ENCOUNTER — Encounter: Payer: Self-pay | Admitting: Gastroenterology

## 2017-10-26 ENCOUNTER — Emergency Department: Payer: BC Managed Care – PPO

## 2017-10-26 ENCOUNTER — Emergency Department
Admission: EM | Admit: 2017-10-26 | Discharge: 2017-10-26 | Disposition: A | Discharge status: Home / Self Care | Payer: BC Managed Care – PPO | Attending: Emergency Medicine | Admitting: Emergency Medicine

## 2017-10-26 DIAGNOSIS — Z79899 Other long term (current) drug therapy: Secondary | ICD-10-CM | POA: Insufficient documentation

## 2017-10-26 DIAGNOSIS — R079 Chest pain, unspecified: Secondary | ICD-10-CM | POA: Insufficient documentation

## 2017-10-26 DIAGNOSIS — Z86711 Personal history of pulmonary embolism: Secondary | ICD-10-CM | POA: Diagnosis not present

## 2017-10-26 DIAGNOSIS — J45909 Unspecified asthma, uncomplicated: Secondary | ICD-10-CM | POA: Insufficient documentation

## 2017-10-26 DIAGNOSIS — Z91040 Latex allergy status: Secondary | ICD-10-CM | POA: Insufficient documentation

## 2017-10-26 DIAGNOSIS — R0602 Shortness of breath: Secondary | ICD-10-CM | POA: Insufficient documentation

## 2017-10-26 LAB — CBC WITH DIFFERENTIAL/PLATELET
Abs Immature Granulocytes: 0.07 10*3/uL (ref 0.00–0.07)
BASOS PCT: 0 %
Basophils Absolute: 0 10*3/uL (ref 0.0–0.1)
EOS ABS: 0 10*3/uL (ref 0.0–0.5)
Eosinophils Relative: 0 %
HCT: 35.3 % — ABNORMAL LOW (ref 36.0–46.0)
Hemoglobin: 11.6 g/dL — ABNORMAL LOW (ref 12.0–15.0)
IMMATURE GRANULOCYTES: 1 %
Lymphocytes Relative: 25 %
Lymphs Abs: 2.8 10*3/uL (ref 0.7–4.0)
MCH: 30 pg (ref 26.0–34.0)
MCHC: 32.9 g/dL (ref 30.0–36.0)
MCV: 91.2 fL (ref 80.0–100.0)
Monocytes Absolute: 0.9 10*3/uL (ref 0.1–1.0)
Monocytes Relative: 8 %
NEUTROS PCT: 66 %
Neutro Abs: 7.3 10*3/uL (ref 1.7–7.7)
PLATELETS: 348 10*3/uL (ref 150–400)
RBC: 3.87 MIL/uL (ref 3.87–5.11)
RDW: 13.6 % (ref 11.5–15.5)
WBC: 11.1 10*3/uL — ABNORMAL HIGH (ref 4.0–10.5)
nRBC: 0 % (ref 0.0–0.2)

## 2017-10-26 LAB — COMPREHENSIVE METABOLIC PANEL
ALBUMIN: 3.4 g/dL — AB (ref 3.5–5.0)
ALT: 14 U/L (ref 0–44)
AST: 16 U/L (ref 15–41)
Alkaline Phosphatase: 73 U/L (ref 38–126)
Anion gap: 9 (ref 5–15)
BUN: 14 mg/dL (ref 6–20)
CHLORIDE: 106 mmol/L (ref 98–111)
CO2: 25 mmol/L (ref 22–32)
Calcium: 8.7 mg/dL — ABNORMAL LOW (ref 8.9–10.3)
Creatinine, Ser: 0.58 mg/dL (ref 0.44–1.00)
GFR calc Af Amer: >60 mL/min (ref >60)
Glucose, Bld: 88 mg/dL (ref 70–99)
POTASSIUM: 3.5 mmol/L (ref 3.5–5.1)
SODIUM: 140 mmol/L (ref 135–145)
Total Bilirubin: 0.4 mg/dL (ref 0.3–1.2)
Total Protein: 7 g/dL (ref 6.5–8.1)

## 2017-10-26 LAB — TROPONIN I

## 2017-10-26 LAB — BRAIN NATRIURETIC PEPTIDE: B Natriuretic Peptide: 8 pg/mL (ref 0.0–100.0)

## 2017-10-26 MED ORDER — DIPHENHYDRAMINE HCL 50 MG/ML IJ SOLN
INTRAMUSCULAR | Status: AC
  Administered 2017-10-26: 50 mg via INTRAVENOUS
  Filled 2017-10-26: qty 1

## 2017-10-26 MED ORDER — ACETAMINOPHEN 500 MG PO TABS
1000.0000 mg | ORAL_TABLET | Freq: Once | ORAL | Status: AC
  Administered 2017-10-26: 1000 mg via ORAL

## 2017-10-26 MED ORDER — DIPHENHYDRAMINE HCL 50 MG/ML IJ SOLN
50.0000 mg | Freq: Once | INTRAMUSCULAR | Status: AC
  Administered 2017-10-26: 50 mg via INTRAVENOUS

## 2017-10-26 MED ORDER — HYDROCORTISONE NA SUCCINATE PF 100 MG IJ SOLR
200.0000 mg | Freq: Once | INTRAMUSCULAR | Status: AC
  Administered 2017-10-26: 200 mg via INTRAVENOUS
  Filled 2017-10-26: qty 4

## 2017-10-26 MED ORDER — ACETAMINOPHEN 500 MG PO TABS
ORAL_TABLET | ORAL | Status: AC
  Administered 2017-10-26: 1000 mg via ORAL
  Filled 2017-10-26: qty 2

## 2017-10-26 MED ORDER — IOHEXOL 350 MG/ML SOLN
75.0000 mL | Freq: Once | INTRAVENOUS | Status: AC | PRN
  Administered 2017-10-26: 75 mL via INTRAVENOUS

## 2017-10-26 MED ORDER — SALINE SPRAY 0.65 % NA SOLN
1.0000 | Freq: Once | NASAL | Status: AC
  Administered 2017-10-26: 1 via NASAL
  Filled 2017-10-26: qty 44

## 2017-10-26 NOTE — ED Notes (Signed)
Patient transported to CT 

## 2017-10-26 NOTE — ED Provider Notes (Signed)
[facility] Emergency Department Provider Note  ____________________________________________   First MD Initiated Contact with Patient 10/26/17 0202     (approximate)  I have reviewed the triage vital signs and the nursing notes.   HISTORY  Chief Complaint No chief complaint on file.   HPI Debbie Sosa is a 42 y.o. female who comes to the emergency department with left-sided chest pain and shortness of breath for the past 24 hours or so.  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  She says she is not supposed to be anticoagulated.  She had sinus surgery several days ago and the packing is still in place.  Her shortness of breath was insidious onset is now constant worse with exertion moderate severity and somewhat improved with rest.  She denies leg swelling.  She has a dry cough.  No fevers or chills.  Her chest pain is sharp left lateral worse with deep inspiration.  Non-positional.    Past Medical History:  Diagnosis Date  . Asthma   . Pulmonary embolism [facility])     Patient Active Problem List   Diagnosis Date Noted  . Acute laryngitis 08/17/2016  . Dysphagia 08/17/2016  . Meningitis 08/17/2016  . 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 Diarrhea, unspecified 10/14/2015  . Left lower quadrant pain 10/14/2015  . Nausea and vomiting 10/14/2015  . 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  . 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 Achalasia 01/07/2015  . Central sleep apnea 01/07/2015  . Obesity (BMI 30-39.9) 01/07/2015  . GERD (gastroesophageal reflux disease) 12/25/2014  . Pulmonary embolism ([place])   . Plantar fasciitis of right foot 12/31/2013    Past Surgical History:  Procedure Laterality Date  . BREAST BIOPSY Left 2006   benign/clip  . BREAST BIOPSY Right 2015   benign/clip  . BREAST REDUCTION SURGERY    . CESAREAN SECTION    . CESAREAN SECTION    .  CHOLECYSTECTOMY    . ESOPHAGOGASTRODUODENOSCOPY (EGD) WITH PROPOFOL N/A 10/04/2016   Procedure: ESOPHAGOGASTRODUODENOSCOPY (EGD) WITH PROPOFOL;  Surgeon: [name], MD;  Location: [facility] ENDOSCOPY;  Service: Gastroenterology;  Laterality: N/A;  . ESOPHAGOGASTRODUODENOSCOPY (EGD) WITH PROPOFOL N/A 04/14/2017   Procedure: ESOPHAGOGASTRODUODENOSCOPY (EGD) WITH PROPOFOL;  Surgeon: [name], MD;  Location: [facility] ENDOSCOPY;  Service: Gastroenterology;  Laterality: N/A;  . GASTRIC BYPASS     01/2015  . KNEE SURGERY    . PLANTAR FASCIA SURGERY     x 3  . REDUCTION MAMMAPLASTY Bilateral 2005    Prior to Admission medications   Medication Sig Start Date End Date Taking? Authorizing Provider  albuterol (PROVENTIL HFA;VENTOLIN HFA) 108 (90 Base) MCG/ACT inhaler Inhale 1-2 puffs INH Q4-6hr prn for chest tightness, cough, wheezing, SOB/DOE. 03/16/15   [provider]  benzonatate (TESSALON) 200 MG capsule take 1 capsule by mouth twice a day if needed 07/06/16   [provider]  Calcium 250 MG CAPS Take by mouth.    [provider]  clindamycin (CLEOCIN) 300 MG capsule Take 1 capsule (300 mg total) by mouth 4 (four) times daily. 11/07/16   Cuthriell, Charline Bills, PA-C  Diclofenac Sodium 3 % GEL Place onto the skin. 12/01/15   [provider]  EPINEPHrine (EPIPEN 2-PAK) 0.3 mg/0.3 mL IJ SOAJ injection EpiPen 2-Pak 0.3 mg/0.3 mL injection, auto-injector    [provider]  fluconazole (DIFLUCAN) 150 MG tablet Take 150 mg at bedtime by  mouth. 11/08/16   [provider]  fluticasone (FLONASE) 50 MCG/ACT nasal spray once as needed.  05/08/14   [provider]  HYDROcodone-acetaminophen (NORCO/VICODIN) 5-325 MG tablet Take 1 tablet by mouth every 4 (four) hours as needed for moderate pain. 11/07/16   Cuthriell, Charline Bills, PA-C  montelukast (SINGULAIR) 10 MG tablet Take by mouth. 08/20/15   [provider]  Multiple Vitamin (MULTI-VITAMINS) TABS  Take by mouth.    [provider]  omeprazole (PRILOSEC) 40 MG capsule Take 1 capsule (40 mg total) by mouth daily. 08/17/16 10/17/16  [name], MD  pantoprazole (PROTONIX) 40 MG tablet Take by mouth. 10/14/15   [provider]  prednisoLONE sodium phosphate (PEDIAPRED) 6.7 (5 Base) MG/5ML SOLN prednisolone 5 mg/5 mL oral solution  Take 10 mL every day by oral route for 6 days.    [provider]  sucralfate (CARAFATE) 1 g tablet Take by mouth. 10/14/15   [provider]  tobramycin (TOBREX) 0.3 % ophthalmic solution instill 1-2 drops into affected eye twice a day 07/07/16   [provider]  vitamin B-12 (CYANOCOBALAMIN) 1000 MCG tablet Take by mouth.    [provider]    Allergies Cephalexin; Fexofenadine; Ketorolac; Morphine; Phenazopyridine; Promethazine; Contrast media [iodinated diagnostic agents]; Eggs or egg-derived products; Latex; Morphine and related; Other; Pyridium [phenazopyridine hcl]; Tramadol; Vioxx [rofecoxib]; Hepatitis b virus vaccine; Naproxen; Oxycodone-acetaminophen; and Sulfa antibiotics  Family History  Problem Relation Age of Onset  . Breast cancer Mother 5  . Breast cancer Cousin 52       3 mat cousins -2 alive    Social History Social History   Tobacco Use  . Smoking status: Never Smoker  . Smokeless tobacco: Never Used  Substance Use Topics  . Alcohol use: No  . Drug use: No    Review of Systems Constitutional: No fever/chills Eyes: No visual changes. ENT: No sore throat. Cardiovascular: Positive for chest pain. Respiratory: Positive for shortness of breath. Gastrointestinal: No abdominal pain.  No nausea, no vomiting.  No diarrhea.  No constipation. Genitourinary: Negative for dysuria. Musculoskeletal: Negative for back pain. Skin: Negative for rash. Neurological: Negative for headaches, focal weakness or numbness.   ____________________________________________   PHYSICAL EXAM:  VITAL  SIGNS: ED Triage Vitals  Enc Vitals Group     BP 10/26/17 0027 115/86     Pulse Rate 10/26/17 0027 83     Resp 10/26/17 0027 19     Temp 10/26/17 0027 98.4 F (36.9 C)     Temp Source 10/26/17 0027 Oral     SpO2 10/26/17 0027 100 %     Weight 10/26/17 0028 182 lb (82.6 kg)     Height 10/26/17 0028 5\' 1"  (1.549 m)     Head Circumference --      Peak Flow --      Pain Score 10/26/17 0027 9     Pain Loc --      Pain Edu? --      Excl. in [place]? --     Constitutional: Appears somewhat uncomfortable although nontoxic no diaphoresis Eyes: PERRL EOMI. Head: Atraumatic. Nose: No congestion/rhinnorhea. Mouth/Throat: No trismus Neck: No stridor.   Cardiovascular: Normal rate, regular rhythm. Grossly normal heart sounds.  Good peripheral circulation.  Chest wall stable although somewhat tender left lateral chest Respiratory: Slightly increased respiratory effort.  No retractions. Lungs CTAB and moving good air Gastrointestinal: Soft nontender Musculoskeletal: No lower extremity edema legs are equal in size Neurologic:  Normal  speech and language. No gross focal neurologic deficits are appreciated. Skin:  Skin is warm, dry and intact. No rash noted.  Specifically no zoster rash noted Psychiatric: Appears tired and somewhat uncomfortable   ____________________________________________   DIFFERENTIAL includes but not limited to  Ulnar embolism, pneumothorax, pneumonia, aortic dissection, sepsis ____________________________________________   LABS (all labs ordered are listed, but only abnormal results are displayed)  Labs Reviewed  CBC WITH DIFFERENTIAL/PLATELET - Abnormal; Notable for the following components:      Result Value   WBC 11.1 (*)    Hemoglobin 11.6 (*)    HCT 35.3 (*)    All other components within normal limits  COMPREHENSIVE METABOLIC PANEL - Abnormal; Notable for the following components:   Calcium 8.7 (*)    Albumin 3.4 (*)    All other components within normal  limits  TROPONIN I  BRAIN NATRIURETIC PEPTIDE    Lab work reviewed by me with no acute disease noted __________________________________________  EKG    ____________________________________________  RADIOLOGY  CT angiogram of the chest reviewed by me with no evidence of pulmonary embolism or pneumonia ____________________________________________   PROCEDURES  Procedure(s) performed: no  Procedures  Critical Care performed: no  ____________________________________________   INITIAL IMPRESSION / ASSESSMENT AND PLAN / ED COURSE  Pertinent labs & imaging results that were available during my care of the patient were reviewed by me and considered in my medical decision making (see chart for details).   As part of my medical decision making, I reviewed the following data within the [facility] History obtained from family if available, nursing notes, old chart and ekg, as well as notes from prior ED visits.  The patient comes the emergency department with worsening shortness of breath in the immediate postoperative period and a history of pulmonary embolism.  Her lungs are clear.  CT angiogram is fortunately reassuring with no signs of clot or infection.  The patient initially reported an allergy to IV contrast although said it just makes her "itchy" and she is fine after Benadryl.  IV Benadryl and Solu-Cortef given and the patient had no reaction.  She does feel improved after some Tylenol and would like to go home.  Strict return precautions have been given.      ____________________________________________   FINAL CLINICAL IMPRESSION(S) / ED DIAGNOSES  Final diagnoses:  Shortness of breath      NEW MEDICATIONS STARTED DURING THIS VISIT:  Discharge Medication List as of 10/26/2017  3:53 AM       Note:  This document was prepared using Dragon voice recognition software and may include unintentional dictation errors.     Darel Hong,  MD 10/28/17 2156

## 2017-10-26 NOTE — ED Notes (Signed)
Per Dr Mable Paris, okay to give Benadryl and Solu-Cortef now in order to send pt to CT Angio.

## 2017-10-26 NOTE — ED Notes (Signed)
Unable to get blood pressure from pt due to pt family wanting to leave and patient getting out of bed.

## 2017-10-26 NOTE — Discharge Instructions (Signed)
Fortunately today your lab work and your CT scan were reassuring.  Please follow-up with your otolaryngologist as scheduled and make an appointment to see her primary care physician this coming Monday for recheck.  Return to the emergency department sooner for any concerns whatsoever.  It was a pleasure to take care of you today, and thank you for coming to our emergency department.  If you have any questions or concerns before leaving please ask the nurse to grab me and I'm more than happy to go through your aftercare instructions again.  If you were prescribed any opioid pain medication today such as Norco, Vicodin, Percocet, morphine, hydrocodone, or oxycodone please make sure you do not drive when you are taking this medication as it can alter your ability to drive safely.  If you have any concerns once you are home that you are not improving or are in fact getting worse before you can make it to your follow-up appointment, please do not hesitate to call 911 and come back for further evaluation.  Darel Hong, MD  Results for orders placed or performed during the hospital encounter of 10/26/17  CBC with Differential  Result Value Ref Range   WBC 11.1 (H) 4.0 - 10.5 K/uL   RBC 3.87 3.87 - 5.11 MIL/uL   Hemoglobin 11.6 (L) 12.0 - 15.0 g/dL   HCT 35.3 (L) 36.0 - 46.0 %   MCV 91.2 80.0 - 100.0 fL   MCH 30.0 26.0 - 34.0 pg   MCHC 32.9 30.0 - 36.0 g/dL   RDW 13.6 11.5 - 15.5 %   Platelets 348 150 - 400 K/uL   nRBC 0.0 0.0 - 0.2 %   Neutrophils Relative % 66 %   Neutro Abs 7.3 1.7 - 7.7 K/uL   Lymphocytes Relative 25 %   Lymphs Abs 2.8 0.7 - 4.0 K/uL   Monocytes Relative 8 %   Monocytes Absolute 0.9 0.1 - 1.0 K/uL   Eosinophils Relative 0 %   Eosinophils Absolute 0.0 0.0 - 0.5 K/uL   Basophils Relative 0 %   Basophils Absolute 0.0 0.0 - 0.1 K/uL   Immature Granulocytes 1 %   Abs Immature Granulocytes 0.07 0.00 - 0.07 K/uL  Comprehensive metabolic panel  Result Value Ref Range   Sodium 140 135 - 145 mmol/L   Potassium 3.5 3.5 - 5.1 mmol/L   Chloride 106 98 - 111 mmol/L   CO2 25 22 - 32 mmol/L   Glucose, Bld 88 70 - 99 mg/dL   BUN 14 6 - 20 mg/dL   Creatinine, Ser 0.58 0.44 - 1.00 mg/dL   Calcium 8.7 (L) 8.9 - 10.3 mg/dL   Total Protein 7.0 6.5 - 8.1 g/dL   Albumin 3.4 (L) 3.5 - 5.0 g/dL   AST 16 15 - 41 U/L   ALT 14 0 - 44 U/L   Alkaline Phosphatase 73 38 - 126 U/L   Total Bilirubin 0.4 0.3 - 1.2 mg/dL   GFR calc non Af Amer >60 >60 mL/min   GFR calc Af Amer >60 >60 mL/min   Anion gap 9 5 - 15  Troponin I  Result Value Ref Range   Troponin I <0.03 <0.03 ng/mL  Brain natriuretic peptide  Result Value Ref Range   B Natriuretic Peptide 8.0 0.0 - 100.0 pg/mL   Dg Chest 2 View  Result Date: 10/26/2017 CLINICAL DATA:  Left-sided chest pain and shortness of breath. Sinus surgery last Friday. Cough. EXAM: CHEST - 2 VIEW COMPARISON:  02/26/2014 FINDINGS: Shallow inspiration with elevation of the left hemidiaphragm. Normal heart size and pulmonary vascularity. No focal airspace disease or consolidation in the lungs. No blunting of costophrenic angles. No pneumothorax. Mediastinal contours appear intact. Surgical clips in the upper abdomen. IMPRESSION: No active cardiopulmonary disease. Electronically Signed   By: Lucienne Capers M.D.   On: 10/26/2017 00:53   Ct Angio Chest Pe W/cm &/or Wo Cm  Result Date: 10/26/2017 CLINICAL DATA:  Left-sided chest pain and shortness of breath. Sinus surgery on Friday. EXAM: CT ANGIOGRAPHY CHEST WITH CONTRAST TECHNIQUE: Multidetector CT imaging of the chest was performed using the standard protocol during bolus administration of intravenous contrast. Multiplanar CT image reconstructions and MIPs were obtained to evaluate the vascular anatomy. CONTRAST:  45mL OMNIPAQUE IOHEXOL 350 MG/ML SOLN. Patient apparently has a questionable contrast allergy and was given Benadryl prior to the examination by the ordering physician. The  technologist notes indicated that the ordering physician spoke to me, but I did not speak with anyone regarding this patient prior to the examination. No reported contrast reaction. COMPARISON:  04/21/2012 FINDINGS: Cardiovascular: Good opacification of the central and segmental pulmonary arteries. No focal filling defects. No evidence of significant pulmonary embolus. Normal caliber thoracic aorta. No aortic dissection. Great vessel origins are patent. Normal heart size. No pericardial effusions. Mediastinum/Nodes: Mildly dilated distal esophagus with an air-fluid level. No obstructing mass or lesion identified. This may represent reflux or dysmotility. No significant lymphadenopathy in the chest. Lungs/Pleura: Mild atelectasis in the lung bases. No airspace disease or consolidation. Emphysematous changes and mild scarring in the upper lungs. No pleural effusions. No pneumothorax. Upper Abdomen: Postoperative changes consistent with gastric bypass. Musculoskeletal: No chest wall abnormality. No acute or significant osseous findings. Review of the MIP images confirms the above findings. IMPRESSION: 1. No evidence of significant pulmonary embolus. 2. No evidence of active pulmonary disease. 3. Mildly dilated distal esophagus with air-fluid level. No obstructing mass or lesion identified. This may represent reflux or dysmotility. 4. Postoperative changes consistent with gastric bypass. Emphysema (ICD10-J43.9). Electronically Signed   By: Lucienne Capers M.D.   On: 10/26/2017 03:08

## 2017-10-26 NOTE — ED Triage Notes (Signed)
Pt arrived from home with complaints of left sided chest pain and SOB. Last Friday pt has sinus surgery and her packing is still in nose, she is to have it removed on tomorrow. Pt also has a cough, but lungs are clear. Pt's main complaint is left sided chest pain and arm pain. All of her pains began to worsen yesterday around 4:00pm Pt is alert and oriented x 4.Pt has Hx of asthma and PE. VS per EMS BP-113/74 O2sat-99%. Temp-98.4

## 2017-12-04 DIAGNOSIS — J309 Allergic rhinitis, unspecified: Secondary | ICD-10-CM | POA: Insufficient documentation

## 2017-12-20 ENCOUNTER — Ambulatory Visit
Admission: RE | Admit: 2017-12-20 | Discharge: 2017-12-20 | Disposition: A | Discharge status: Home / Self Care | Payer: BC Managed Care – PPO | Source: Ambulatory Visit | Attending: Obstetrics and Gynecology | Admitting: Obstetrics and Gynecology

## 2017-12-20 DIAGNOSIS — Z1231 Encounter for screening mammogram for malignant neoplasm of breast: Secondary | ICD-10-CM | POA: Diagnosis not present

## 2017-12-31 ENCOUNTER — Other Ambulatory Visit: Payer: Self-pay

## 2017-12-31 ENCOUNTER — Ambulatory Visit
Admission: EM | Admit: 2017-12-31 | Discharge: 2017-12-31 | Disposition: A | Discharge status: Home / Self Care | Payer: BC Managed Care – PPO | Attending: Emergency Medicine | Admitting: Emergency Medicine

## 2017-12-31 ENCOUNTER — Encounter: Payer: Self-pay | Admitting: Gynecology

## 2017-12-31 DIAGNOSIS — R21 Rash and other nonspecific skin eruption: Secondary | ICD-10-CM

## 2017-12-31 MED ORDER — TRIAMCINOLONE ACETONIDE 0.1 % EX CREA: 1.0000 "application " | TOPICAL_CREAM | Freq: Two times a day (BID) | CUTANEOUS | 0 refills | Status: DC

## 2017-12-31 MED ORDER — MUPIROCIN 2 % EX OINT: 1.0000 "application " | TOPICAL_OINTMENT | Freq: Three times a day (TID) | CUTANEOUS | 0 refills | Status: DC

## 2017-12-31 NOTE — Discharge Instructions (Addendum)
Arrange an appointment with dermatology next week

## 2017-12-31 NOTE — ED Triage Notes (Signed)
Patient c/o rash at bilateral axillary region. Per patient rash spreading.

## 2017-12-31 NOTE — ED Provider Notes (Addendum)
[facility]    CSN: [id number] Arrival date & time: 12/31/17  1408     History   Chief Complaint No chief complaint on file.   HPI Debbie Sosa is a 42 y.o. female.   HPI  42 year old female presents with a tender  rash is spreading.  She states that her initial lesion was in the axilla approximately 21 months ago.  Since that time she has noticed that she has had more appear.  She has seen her primary care physician who keeps telling her that it is rubbing from her bra straps.  Last night at  a party she noticed that her arms were hurting more when she had them by her side.  This morning when she took a shower she noticed that she had more of the lesions that are very tender to the touch.  It now also has a red ring around each 1 of the circular lesions.  They are confined to the armpits do not have anywhere else on her body.  This is sometimes they will have a thin layer of skin that will peel off of them.  Last night and Today were the first times that they were tender to the touch.       Past Medical History:  Diagnosis Date  . Asthma   . Pulmonary embolism [facility])     Patient Active Problem List   Diagnosis Date Noted  . Acute laryngitis 08/17/2016  . Dysphagia 08/17/2016  . Meningitis 08/17/2016  . 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 Diarrhea, unspecified 10/14/2015  . Left lower quadrant pain 10/14/2015  . Nausea and vomiting 10/14/2015  . 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  . 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 Achalasia 01/07/2015  . Central sleep apnea 01/07/2015  . Obesity (BMI 30-39.9) 01/07/2015  . GERD (gastroesophageal reflux disease) 12/25/2014  . Pulmonary embolism ([place])   . Plantar fasciitis of right foot 12/31/2013    Past Surgical History:  Procedure Laterality Date  . BREAST BIOPSY Left 2006   benign/clip  . BREAST BIOPSY Right 2015   benign/clip  . BREAST REDUCTION SURGERY    . CESAREAN SECTION    . CESAREAN SECTION    .  CHOLECYSTECTOMY    . ESOPHAGOGASTRODUODENOSCOPY (EGD) WITH PROPOFOL N/A 10/04/2016   Procedure: ESOPHAGOGASTRODUODENOSCOPY (EGD) WITH PROPOFOL;  Surgeon: [name], MD;  Location: [facility] ENDOSCOPY;  Service: Gastroenterology;  Laterality: N/A;  . ESOPHAGOGASTRODUODENOSCOPY (EGD) WITH PROPOFOL N/A 04/14/2017   Procedure: ESOPHAGOGASTRODUODENOSCOPY (EGD) WITH PROPOFOL;  Surgeon: [name], MD;  Location: [facility] ENDOSCOPY;  Service: Gastroenterology;  Laterality: N/A;  . GASTRIC BYPASS     01/2015  . KNEE SURGERY    . PLANTAR FASCIA SURGERY     x 3  . REDUCTION MAMMAPLASTY Bilateral 2005    OB History   No obstetric history on file.      Home Medications    Prior to Admission medications   Medication Sig Start Date End Date Taking? Authorizing Provider  albuterol (PROVENTIL HFA;VENTOLIN HFA) 108 (90 Base) MCG/ACT inhaler Inhale 1-2 puffs INH Q4-6hr prn for chest tightness, cough, wheezing, SOB/DOE. 03/16/15  Yes [provider]  EPINEPHrine (EPIPEN 2-PAK) 0.3 mg/0.3 mL IJ SOAJ injection EpiPen 2-Pak 0.3 mg/0.3 mL injection, auto-injector   Yes [provider]  montelukast (SINGULAIR) 10 MG tablet Take by mouth. 08/20/15  Yes [provider]  Multiple Vitamin (MULTI-VITAMINS) TABS Take by mouth.   Yes [provider]  clindamycin (CLEOCIN) 300 MG capsule Take 1  capsule (300 mg total) by mouth 4 (four) times daily. 11/07/16   Cuthriell, Charline Bills, PA-C  Diclofenac Sodium 3 % GEL Place onto the skin. 12/01/15   [provider]  fluticasone (FLONASE) 50 MCG/ACT nasal spray once as needed.  05/08/14   [provider]  mupirocin ointment (BACTROBAN) 2 % Apply 1 application topically 3 (three) times daily. 12/31/17   Lorin Picket, PA-C  omeprazole (PRILOSEC) 40 MG capsule Take 1 capsule (40 mg total) by mouth daily. 08/17/16 10/17/16  [name], MD  sucralfate (CARAFATE) 1 g tablet Take by mouth. 10/14/15   [provider]  tobramycin  (TOBREX) 0.3 % ophthalmic solution instill 1-2 drops into affected eye twice a day 07/07/16   [provider]  triamcinolone cream (KENALOG) 0.1 % Apply 1 application topically 2 (two) times daily. 12/31/17   Lorin Picket, PA-C    Family History Family History  Problem Relation Age of Onset  . Breast cancer Mother 28  . Breast cancer Cousin 30  . Breast cancer Cousin 19    Social History Social History   Tobacco Use  . Smoking status: Never Smoker  . Smokeless tobacco: Never Used  Substance Use Topics  . Alcohol use: No  . Drug use: No     Allergies   Cephalexin; Fexofenadine; Ketorolac; Morphine; Phenazopyridine; Promethazine; Contrast media [iodinated diagnostic agents]; Eggs or egg-derived products; Latex; Morphine and related; Other; Pyridium [phenazopyridine hcl]; Tramadol; Vioxx [rofecoxib]; Hepatitis b virus vaccine; Naproxen; Oxycodone-acetaminophen; and Sulfa antibiotics   Review of Systems Review of Systems  Constitutional: Positive for activity change. Negative for appetite change, chills, fatigue and fever.  All other systems reviewed and are negative.    Physical Exam Triage Vital Signs ED Triage Vitals  Enc Vitals Group     BP 12/31/17 1428 124/88     Pulse Rate 12/31/17 1428 81     Resp 12/31/17 1428 16     Temp 12/31/17 1428 98.3 F (36.8 C)     Temp Source 12/31/17 1428 Oral     SpO2 12/31/17 1428 100 %     Weight 12/31/17 1431 182 lb (82.6 kg)     Height 12/31/17 1431 5\' 1"  (1.549 m)     Head Circumference --      Peak Flow --      Pain Score 12/31/17 1431 2     Pain Loc --      Pain Edu? --      Excl. in [place]? --    No data found.  Updated Vital Signs BP 124/88 (BP Location: Left Arm)   Pulse 81   Temp 98.3 F (36.8 C) (Oral)   Resp 16   Ht 5\' 1"  (1.549 m)   Wt 182 lb (82.6 kg)   SpO2 100%   BMI 34.39 kg/m   Visual Acuity Right Eye Distance:   Left Eye Distance:   Bilateral Distance:    Right Eye Near:   Left Eye  Near:    Bilateral Near:     Physical Exam Vitals signs and nursing note reviewed.  Constitutional:      General: She is not in acute distress.    Appearance: Normal appearance. She is not ill-appearing, toxic-appearing or diaphoretic.  HENT:     Head: Normocephalic.     Nose: Nose normal.     Mouth/Throat:     Mouth: Mucous membranes are moist.  Eyes:     Pupils: Pupils are equal, round, and reactive  to light.  Pulmonary:     Effort: Pulmonary effort is normal.     Breath sounds: Normal breath sounds.  Musculoskeletal: Normal range of motion.  Skin:    General: Skin is warm and dry.     Findings: Rash present.     Comments: Refer to photographs for detail.  Rash is shows a well prescribed dark lesions in the axilla and upper arm a red halo.  The lesions are non-indurated or not fluctuant but are exquisitely tender to the touch.  Do not blanch.  Neurological:     General: No focal deficit present.     Mental Status: She is alert and oriented to person, place, and time.  Psychiatric:        Mood and Affect: Mood normal.        Behavior: Behavior normal.        Thought Content: Thought content normal.        Judgment: Judgment normal.            UC Treatments / Results  Labs (all labs ordered are listed, but only abnormal results are displayed) Labs Reviewed - No data to display  EKG None  Radiology No results found.  Procedures Procedures (including critical care time)  Medications Ordered in UC Medications - No data to display  Initial Impression / Assessment and Plan / UC Course  I have reviewed the triage vital signs and the nursing notes.  Pertinent labs & imaging results that were available during my care of the patient were reviewed by me and considered in my medical decision making (see chart for details).   -Advised the patient that she will need to follow-up with dermatology possibly for a  biopsy.  She will arrange an appointment this week.  At  this time we will have her use triamcinolone cream to see if this will help with the pain.  She does not endorse any constitutional symptoms.   Final Clinical Impressions(s) / UC Diagnoses   Final diagnoses:  Rash and nonspecific skin eruption     Discharge Instructions     Arrange an appointment with dermatology next week    ED Prescriptions    Medication Sig Dispense Auth. Provider   triamcinolone cream (KENALOG) 0.1 % Apply 1 application topically 2 (two) times daily. 30 g Crecencio Mc P, PA-C   mupirocin ointment (BACTROBAN) 2 % Apply 1 application topically 3 (three) times daily. 22 g Lorin Picket, PA-C     Controlled Substance Prescriptions [place] Controlled Substance Registry consulted? Not Applicable   Lorin Picket, PA-C 12/31/17 1703    Lorin Picket, PA-C 12/31/17 1706

## 2018-01-17 DIAGNOSIS — U071 COVID-19: Secondary | ICD-10-CM

## 2018-01-17 HISTORY — DX: COVID-19: U07.1

## 2018-01-31 ENCOUNTER — Emergency Department
Admission: EM | Admit: 2018-01-31 | Discharge: 2018-01-31 | Disposition: A | Discharge status: Home / Self Care | Payer: BC Managed Care – PPO | Attending: Emergency Medicine | Admitting: Emergency Medicine

## 2018-01-31 ENCOUNTER — Emergency Department: Payer: BC Managed Care – PPO

## 2018-01-31 ENCOUNTER — Other Ambulatory Visit: Payer: Self-pay

## 2018-01-31 DIAGNOSIS — Z79899 Other long term (current) drug therapy: Secondary | ICD-10-CM | POA: Insufficient documentation

## 2018-01-31 DIAGNOSIS — J45909 Unspecified asthma, uncomplicated: Secondary | ICD-10-CM | POA: Insufficient documentation

## 2018-01-31 DIAGNOSIS — Z91040 Latex allergy status: Secondary | ICD-10-CM | POA: Diagnosis not present

## 2018-01-31 DIAGNOSIS — J189 Pneumonia, unspecified organism: Secondary | ICD-10-CM | POA: Insufficient documentation

## 2018-01-31 DIAGNOSIS — R05 Cough: Secondary | ICD-10-CM | POA: Diagnosis present

## 2018-01-31 DIAGNOSIS — R0602 Shortness of breath: Secondary | ICD-10-CM | POA: Insufficient documentation

## 2018-01-31 LAB — CBC WITH DIFFERENTIAL/PLATELET
Abs Immature Granulocytes: 0.07 10*3/uL (ref 0.00–0.07)
Basophils Absolute: 0 10*3/uL (ref 0.0–0.1)
Basophils Relative: 0 %
Eosinophils Absolute: 0 10*3/uL (ref 0.0–0.5)
Eosinophils Relative: 0 %
HCT: 36.8 % (ref 36.0–46.0)
Hemoglobin: 11.8 g/dL — ABNORMAL LOW (ref 12.0–15.0)
Immature Granulocytes: 1 %
Lymphocytes Relative: 13 %
Lymphs Abs: 1.5 10*3/uL (ref 0.7–4.0)
MCH: 28.8 pg (ref 26.0–34.0)
MCHC: 32.1 g/dL (ref 30.0–36.0)
MCV: 89.8 fL (ref 80.0–100.0)
Monocytes Absolute: 0.8 10*3/uL (ref 0.1–1.0)
Monocytes Relative: 7 %
Neutro Abs: 9.2 10*3/uL — ABNORMAL HIGH (ref 1.7–7.7)
Neutrophils Relative %: 79 %
Platelets: 340 10*3/uL (ref 150–400)
RBC: 4.1 MIL/uL (ref 3.87–5.11)
RDW: 13.4 % (ref 11.5–15.5)
WBC: 11.6 10*3/uL — ABNORMAL HIGH (ref 4.0–10.5)
nRBC: 0 % (ref 0.0–0.2)

## 2018-01-31 LAB — BASIC METABOLIC PANEL
Anion gap: 8 (ref 5–15)
BUN: 15 mg/dL (ref 6–20)
CO2: 23 mmol/L (ref 22–32)
Calcium: 9 mg/dL (ref 8.9–10.3)
Chloride: 106 mmol/L (ref 98–111)
Creatinine, Ser: 0.76 mg/dL (ref 0.44–1.00)
GFR calc Af Amer: >60 mL/min (ref >60)
GFR calc non Af Amer: >60 mL/min (ref >60)
Glucose, Bld: 118 mg/dL — ABNORMAL HIGH (ref 70–99)
Potassium: 4.3 mmol/L (ref 3.5–5.1)
Sodium: 137 mmol/L (ref 135–145)

## 2018-01-31 LAB — TROPONIN I: Troponin I: <0.03 ng/mL (ref <0.03)

## 2018-01-31 LAB — BRAIN NATRIURETIC PEPTIDE: B Natriuretic Peptide: 14 pg/mL (ref 0.0–100.0)

## 2018-01-31 MED ORDER — PREDNISONE 50 MG PO TABS: ORAL_TABLET | ORAL | 0 refills | Status: DC

## 2018-01-31 MED ORDER — AZITHROMYCIN 250 MG PO TABS: ORAL_TABLET | ORAL | 0 refills | Status: DC

## 2018-01-31 MED ORDER — CEFTRIAXONE SODIUM 1 G IJ SOLR
1.0000 g | Freq: Once | INTRAMUSCULAR | Status: AC
  Administered 2018-01-31: 1 g via INTRAVENOUS
  Filled 2018-01-31: qty 10

## 2018-01-31 NOTE — ED Notes (Addendum)
Pt also states some SOB. Pt states it is only when she walks some. Pt refusing EKG and bloodwork at this time.  Pt states it is just cough and congestion and she needs some relief.

## 2018-01-31 NOTE — ED Triage Notes (Signed)
Pt comes via POV from home with c/o cough and congestion. Pt states this has been going on since January 4th. Pt states she has taken antibiotics and they haven't helped.

## 2018-01-31 NOTE — ED Notes (Signed)
EKG performed by EDT Thedore Mins

## 2018-01-31 NOTE — ED Provider Notes (Signed)
Apolonio Schneiders, attending physician, personally viewed and interpreted this EKG  EKG Time: 2129 Rate: 73 Rhythm: normal sinus rhythm Axis: normal Intervals: qtc 407 QRS: narrow, q waves v1 ST changes: no st elevation Impression: abnormal Val Eagle, MD 01/31/18 2130

## 2018-02-05 NOTE — ED Provider Notes (Signed)
[facility] Emergency Department Provider Note  ____________________________________________  Time seen: Approximately 9:43 PM  I have reviewed the triage vital signs and the nursing notes.   HISTORY  Chief Complaint Cough and Nasal Congestion    HPI Debbie Sosa is a 43 y.o. female with a history of PE and asthma, presents to the emergency department with chronic cough for approximately 1 month.  Patient was seen and evaluated at urgent care in December approximately 1 week after having a viral URI.  Patient was diagnosed with bronchitis and was treated with Levaquin and prednisone for a combination of bronchitis and sinusitis. Patient reports that her symptoms did not improve.  Cough seems to be worse at night.  Patient states that PE in the past was diagnosed after pregnancy.  Patient has not experienced subsequent PE.  No recent travel, prolonged immobilization or daily smoking.  Patient denies orthopnea or paroxysmal nocturnal dyspnea.  Patient states that she has had chills at home.  No emesis or diarrhea.  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   Past Medical History:  Diagnosis Date  . Asthma   . Pulmonary embolism [facility])     Patient Active Problem List   Diagnosis Date Noted  . Acute laryngitis 08/17/2016  . Dysphagia 08/17/2016  . Meningitis 08/17/2016  . 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 Diarrhea, unspecified 10/14/2015  . Left lower quadrant pain 10/14/2015  . Nausea and vomiting 10/14/2015  . 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  . 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 Achalasia 01/07/2015  . Central sleep apnea 01/07/2015  . Obesity (BMI 30-39.9) 01/07/2015  . GERD (gastroesophageal reflux disease) 12/25/2014  . Pulmonary embolism ([place])   . Plantar fasciitis of right foot 12/31/2013    Past Surgical History:  Procedure Laterality Date  . BREAST BIOPSY Left 2006   benign/clip  .  BREAST BIOPSY Right 2015   benign/clip  . BREAST REDUCTION SURGERY    . CESAREAN SECTION    . CESAREAN SECTION    . CHOLECYSTECTOMY    . ESOPHAGOGASTRODUODENOSCOPY (EGD) WITH PROPOFOL N/A 10/04/2016   Procedure: ESOPHAGOGASTRODUODENOSCOPY (EGD) WITH PROPOFOL;  Surgeon: [name], MD;  Location: [facility] ENDOSCOPY;  Service: Gastroenterology;  Laterality: N/A;  . ESOPHAGOGASTRODUODENOSCOPY (EGD) WITH PROPOFOL N/A 04/14/2017   Procedure: ESOPHAGOGASTRODUODENOSCOPY (EGD) WITH PROPOFOL;  Surgeon: [name], MD;  Location: [facility] ENDOSCOPY;  Service: Gastroenterology;  Laterality: N/A;  . GASTRIC BYPASS     01/2015  . KNEE SURGERY    . PLANTAR FASCIA SURGERY     x 3  . REDUCTION MAMMAPLASTY Bilateral 2005    Prior to Admission medications   Medication Sig Start Date End Date Taking? Authorizing Provider  albuterol (PROVENTIL HFA;VENTOLIN HFA) 108 (90 Base) MCG/ACT inhaler Inhale 1-2 puffs INH Q4-6hr prn for chest tightness, cough, wheezing, SOB/DOE. 03/16/15   [provider]  azithromycin (ZITHROMAX Z-PAK) 250 MG tablet Take 2 tablets on the first day.  Take 1 tablet by mouth for the following 4 days. 01/31/18   Lannie Fields, PA-C  clindamycin (CLEOCIN) 300 MG capsule Take 1 capsule (300 mg total) by mouth 4 (four) times daily. 11/07/16   Cuthriell, Charline Bills, PA-C  Diclofenac Sodium 3 % GEL Place onto the skin. 12/01/15   [provider]  EPINEPHrine (EPIPEN 2-PAK) 0.3 mg/0.3 mL IJ SOAJ injection EpiPen 2-Pak 0.3 mg/0.3 mL injection, auto-injector    [provider]  fluticasone (FLONASE) 50 MCG/ACT nasal  spray once as needed.  05/08/14   [provider]  montelukast (SINGULAIR) 10 MG tablet Take by mouth. 08/20/15   [provider]  Multiple Vitamin (MULTI-VITAMINS) TABS Take by mouth.    [provider]  mupirocin ointment (BACTROBAN) 2 % Apply 1 application topically 3 (three) times daily. 12/31/17   Lorin Picket, PA-C  omeprazole  (PRILOSEC) 40 MG capsule Take 1 capsule (40 mg total) by mouth daily. 08/17/16 10/17/16  [name], MD  predniSONE (DELTASONE) 50 MG tablet Take one 50 mg tablet once daily for the next five days. 01/31/18   Lannie Fields, PA-C  sucralfate (CARAFATE) 1 g tablet Take by mouth. 10/14/15   [provider]  tobramycin (TOBREX) 0.3 % ophthalmic solution instill 1-2 drops into affected eye twice a day 07/07/16   [provider]  triamcinolone cream (KENALOG) 0.1 % Apply 1 application topically 2 (two) times daily. 12/31/17   Lorin Picket, PA-C    Allergies Cephalexin; Fexofenadine; Ketorolac; Morphine; Phenazopyridine; Promethazine; Contrast media [iodinated diagnostic agents]; Eggs or egg-derived products; Latex; Morphine and related; Other; Pyridium [phenazopyridine hcl]; Tramadol; Vioxx [rofecoxib]; Hepatitis b virus vaccine; Naproxen; Oxycodone-acetaminophen; and Sulfa antibiotics  Family History  Problem Relation Age of Onset  . Breast cancer Mother 53  . Breast cancer Cousin 30  . Breast cancer Cousin 62    Social History Social History   Tobacco Use  . Smoking status: Never Smoker  . Smokeless tobacco: Never Used  Substance Use Topics  . Alcohol use: No  . Drug use: No     Review of Systems  Constitutional: No fever/chills Eyes: No visual changes. No discharge ENT: No upper respiratory complaints. Cardiovascular: no chest pain. Respiratory: Patient has cough. No SOB. Gastrointestinal: No abdominal pain.  No nausea, no vomiting.  No diarrhea.  No constipation. Genitourinary: Negative for dysuria. No hematuria Musculoskeletal: Negative for musculoskeletal pain. Skin: Negative for rash, abrasions, lacerations, ecchymosis. Neurological: Negative for headaches, focal weakness or numbness.   ____________________________________________   PHYSICAL EXAM:  VITAL SIGNS: ED Triage Vitals  Enc Vitals Group     BP 01/31/18 1900 (!) 112/92     Pulse Rate  01/31/18 1900 94     Resp 01/31/18 1900 18     Temp 01/31/18 1900 98 F (36.7 C)     Temp Source 01/31/18 2331 Oral     SpO2 01/31/18 1900 98 %     Weight 01/31/18 1858 193 lb (87.5 kg)     Height 01/31/18 1858 5\' 1"  (1.549 m)     Head Circumference --      Peak Flow --      Pain Score 01/31/18 1858 5     Pain Loc --      Pain Edu? --      Excl. in [place]? --      Constitutional: Alert and oriented. Well appearing and in no acute distress. Eyes: Conjunctivae are normal. PERRL. EOMI. Head: Atraumatic. ENT:      Ears: TMs are pearly.      Nose: No congestion/rhinnorhea.      Mouth/Throat: Mucous membranes are moist.  Neck: No stridor.  No cervical spine tenderness to palpation. Hematological/Lymphatic/Immunilogical: No cervical lymphadenopathy.  Cardiovascular: Normal rate, regular rhythm. Normal S1 and S2.  Good peripheral circulation. Respiratory: Normal respiratory effort without tachypnea or retractions. Lungs CTAB. Good air entry to the bases with no decreased or absent breath sounds. Gastrointestinal: Bowel sounds 4 quadrants. Soft and nontender to palpation.  No guarding or rigidity. No palpable masses. No distention. No CVA tenderness. Musculoskeletal: Full range of motion to all extremities. No gross deformities appreciated. Neurologic:  Normal speech and language. No gross focal neurologic deficits are appreciated.  Skin:  Skin is warm, dry and intact. No rash noted. Psychiatric: Mood and affect are normal. Speech and behavior are normal. Patient exhibits appropriate insight and judgement.   ____________________________________________   LABS (all labs ordered are listed, but only abnormal results are displayed)  Labs Reviewed  CBC WITH DIFFERENTIAL/PLATELET - Abnormal; Notable for the following components:      Result Value   WBC 11.6 (*)    Hemoglobin 11.8 (*)    Neutro Abs 9.2 (*)    All other components within normal limits  BASIC METABOLIC PANEL - Abnormal;  Notable for the following components:   Glucose, Bld 118 (*)    All other components within normal limits  BRAIN NATRIURETIC PEPTIDE  TROPONIN I   ____________________________________________  EKG   ____________________________________________  RADIOLOGY I personally viewed and evaluated these images as part of my medical decision making, as well as reviewing the written report by the radiologist.  No consolidations, opacities or infiltrates that would suggest community-acquired pneumonia.  No results found.  ____________________________________________    PROCEDURES  Procedure(s) performed:    Procedures    Medications  cefTRIAXone (ROCEPHIN) 1 g in sodium chloride 0.9 % 100 mL IVPB (0 g Intravenous Stopped 01/31/18 2330)     ____________________________________________   INITIAL IMPRESSION / ASSESSMENT AND PLAN / ED COURSE  Pertinent labs & imaging results that were available during my care of the patient were reviewed by me and considered in my medical decision making (see chart for details).  Review of the [place] CSRS was performed in accordance of the [place] prior to dispensing any controlled drugs.      Assessment and plan Community-acquired pneumonia. Patient presents to the emergency department with nonproductive cough, shortness of breath and chills approximately 1 month after having a viral URI.  Patient was treated with Levaquin and prednisone approximately 1 week after having viral URI-like symptoms.  I suspect that patient was treated prematurely with antibiotic when patient was still having viral symptoms.  I suspect that patient has early community-acquired pneumonia given symptoms and mild leukocytosis identified on CBC.  Troponin was within reference range as well as the BNP.  Patient was given Rocephin in the emergency department and was discharged with azithromycin and prednisone.  Patient was advised to finish azithromycin to completion in order to  avoid contribution antibiotic resistance.  Strict return precautions were given to return to the emergency department for new or worsening symptoms.  All patient questions were answered.  ____________________________________________  FINAL CLINICAL IMPRESSION(S) / ED DIAGNOSES  Final diagnoses:  Community acquired pneumonia, unspecified laterality      NEW MEDICATIONS STARTED DURING THIS VISIT:  ED Discharge Orders         Ordered    azithromycin (ZITHROMAX Z-PAK) 250 MG tablet     01/31/18 2231    predniSONE (DELTASONE) 50 MG tablet     01/31/18 2231              This chart was dictated using voice recognition software/Dragon. Despite best efforts to proofread, errors can occur which can change the meaning. Any change was purely unintentional.    Karren Cobble 02/05/18 2152    Rudene Re, MD 02/05/18 2258

## 2018-03-21 IMAGING — MG 2D DIGITAL DIAGNOSTIC UNILATERAL LEFT MAMMOGRAM WITH CAD AND ADJ
6 of 9 series · 6 of 21 positions shown · non-contrast
Comparison: Previous exam(s).

CLINICAL DATA: Recall for a left breast asymmetry.

EXAM:
2D DIGITAL DIAGNOSTIC UNILATERAL LEFT MAMMOGRAM WITH CAD AND ADJUNCT
TOMO

[L CC (1 of 2)]
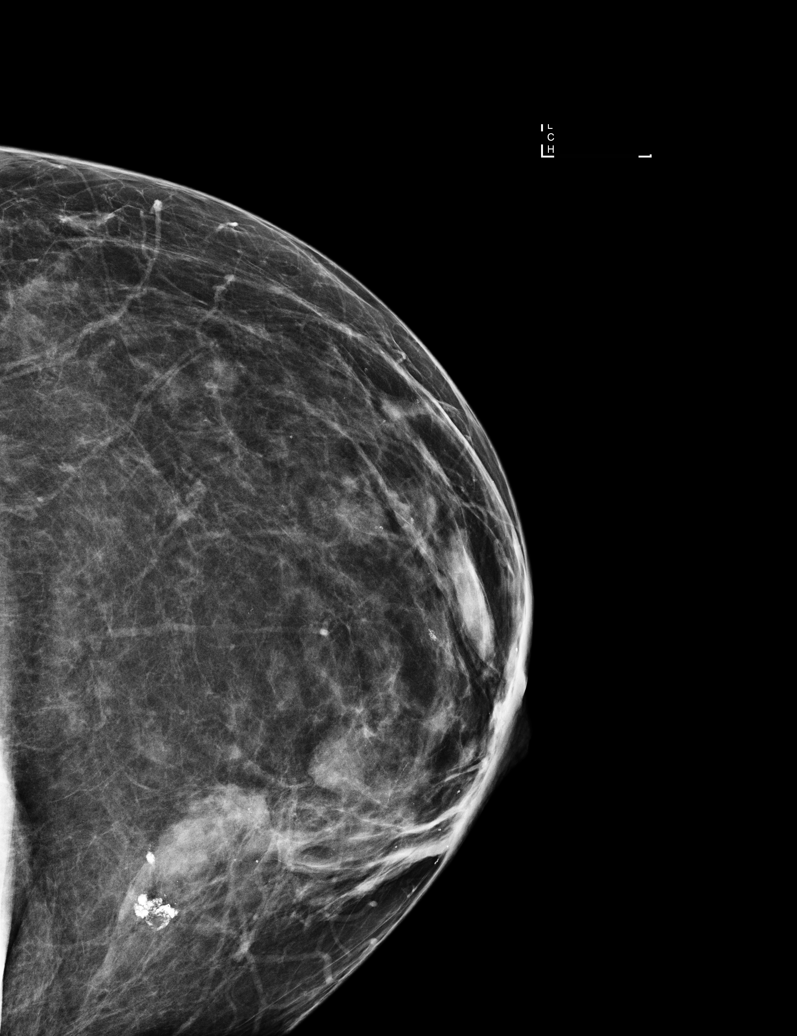

[L CC synth-2D (1 of 2)]
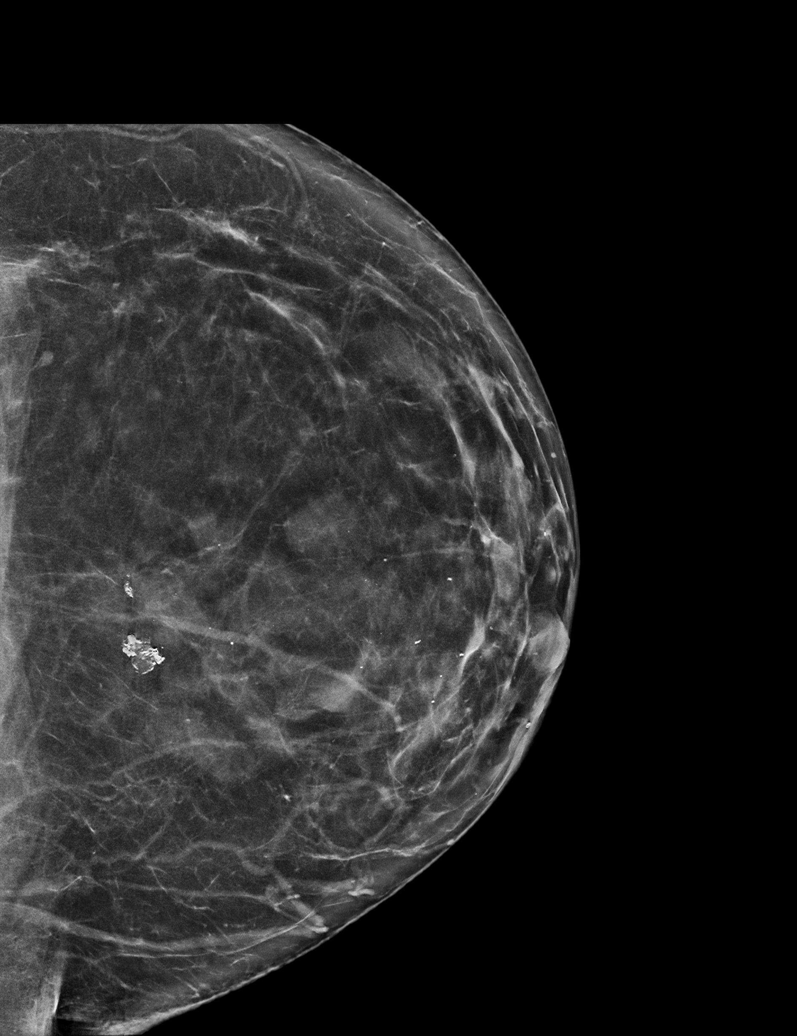

[L CC (2 of 2)]
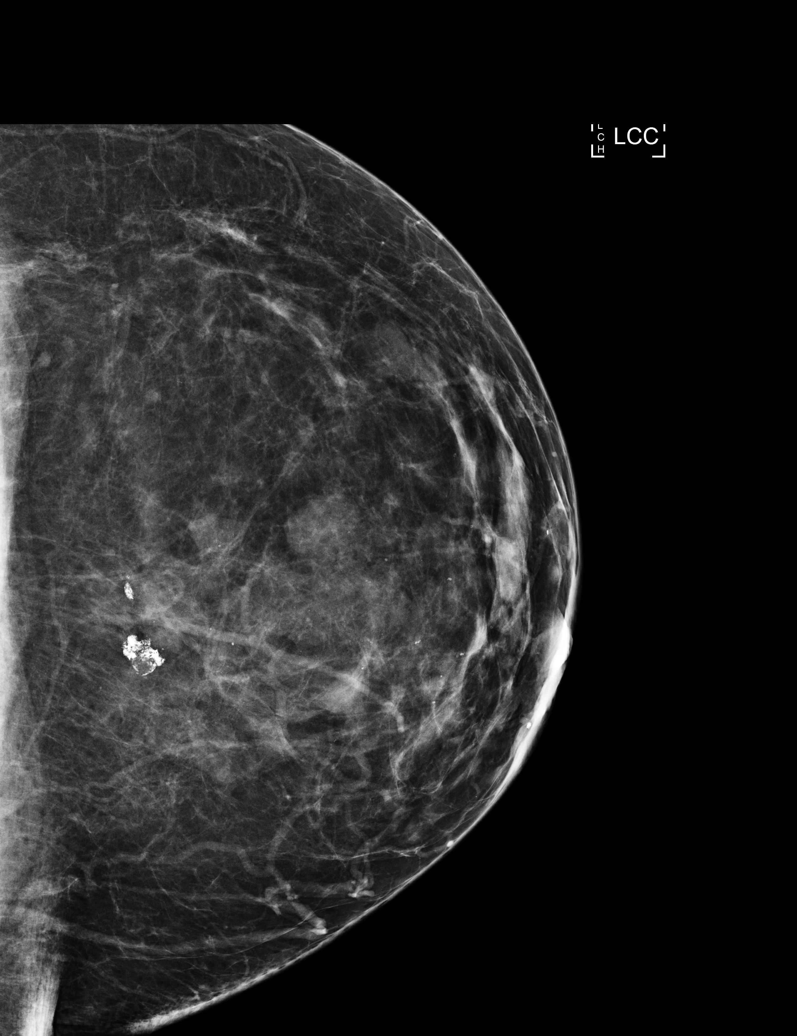

[L MLO synth-2D]
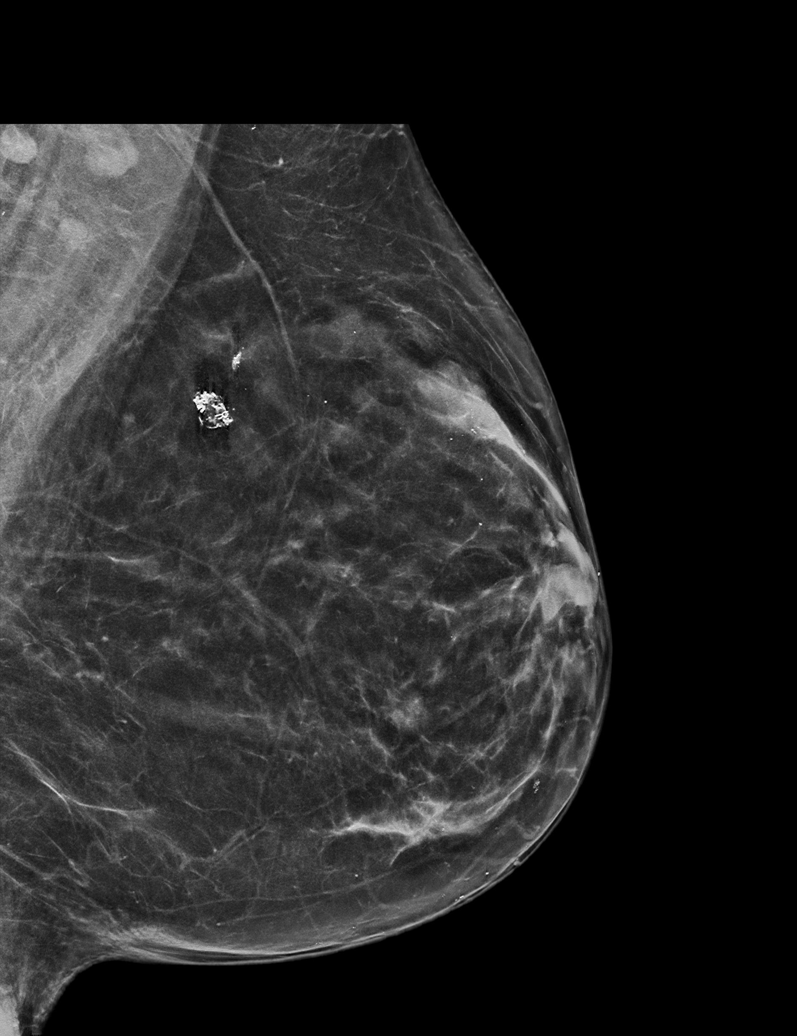

[L CC synth-2D (2 of 2)]
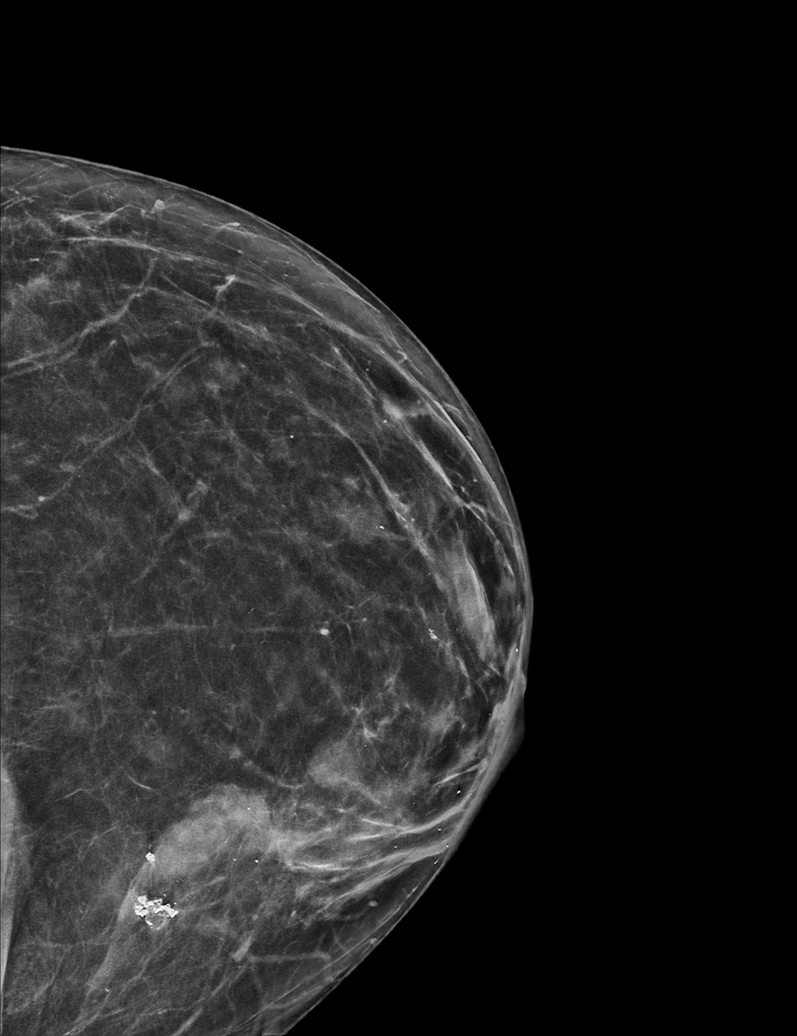

[L MLO]
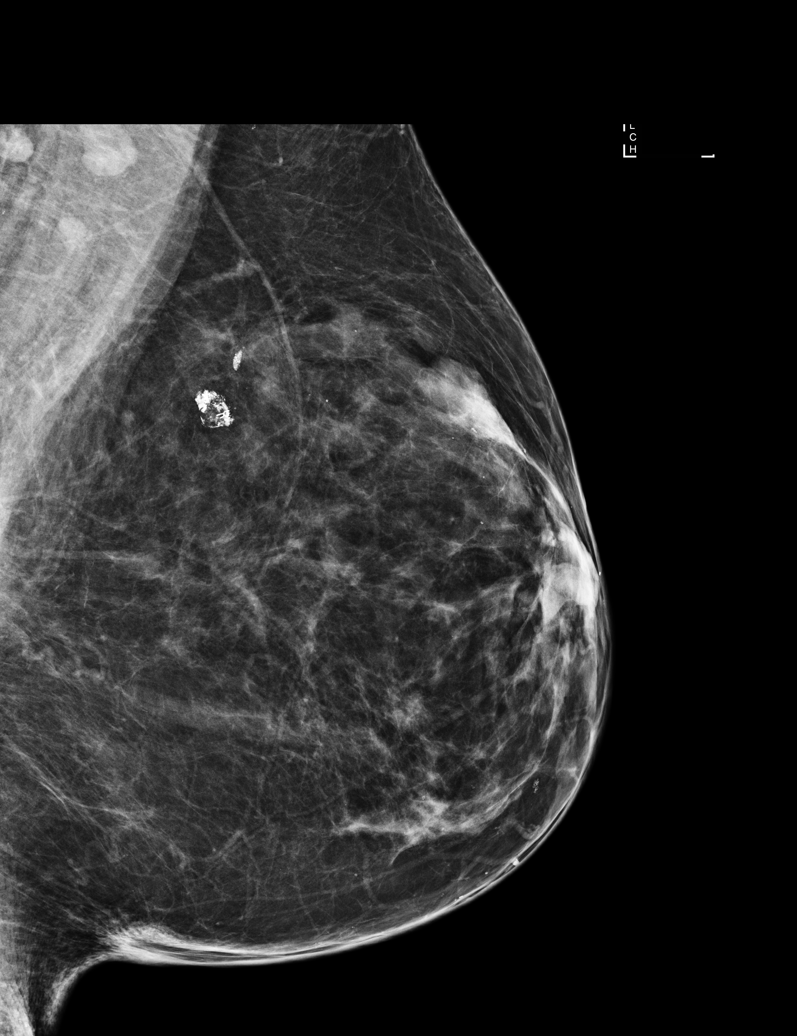

[6 of 21 positions shown; findings below may reference images not displayed]

ACR Breast Density Category b: There are scattered areas of
fibroglandular density.
FINDINGS: The asymmetry of concern in the lateral left breast resolves with
tomosynthesis imaging consistent with normal overlapping tissues. No
suspicious calcifications, masses or areas of distortion are seen in
the left breast.

Mammographic images were processed with CAD.
IMPRESSION: Resolution of the asymmetry of concern in the left breast consistent
with overlapping tissues appear

RECOMMENDATION:
1.  Screening mammogram in one year.(Code:[id number])

2. Consider genetics counseling if not already performed to
determine the patient's lifetime risk of breast cancer. Per American
Cancer Society guidelines, if the patient has a calculated lifetime
risk of developing breast cancer of greater than 20%, annual
screening MRI of the breasts would be recommended at the time of
screening mammography.

I have discussed the findings and recommendations with the patient.
Results were also provided in writing at the conclusion of the
visit. If applicable, a reminder letter will be sent to the patient
regarding the next appointment.

BI-RADS CATEGORY  1: Negative.

## 2018-05-02 ENCOUNTER — Other Ambulatory Visit: Payer: Self-pay | Admitting: Obstetrics and Gynecology

## 2018-05-02 DIAGNOSIS — Z1231 Encounter for screening mammogram for malignant neoplasm of breast: Secondary | ICD-10-CM

## 2018-05-11 ENCOUNTER — Encounter: Payer: Self-pay | Admitting: Emergency Medicine

## 2018-05-11 ENCOUNTER — Emergency Department
Admission: EM | Admit: 2018-05-11 | Discharge: 2018-05-11 | Disposition: A | Discharge status: Home / Self Care | Payer: BC Managed Care – PPO | Attending: Emergency Medicine | Admitting: Emergency Medicine

## 2018-05-11 ENCOUNTER — Emergency Department: Payer: BC Managed Care – PPO

## 2018-05-11 ENCOUNTER — Other Ambulatory Visit: Payer: Self-pay

## 2018-05-11 DIAGNOSIS — S7011XA Contusion of right thigh, initial encounter: Secondary | ICD-10-CM | POA: Insufficient documentation

## 2018-05-11 DIAGNOSIS — Z86711 Personal history of pulmonary embolism: Secondary | ICD-10-CM | POA: Insufficient documentation

## 2018-05-11 DIAGNOSIS — X58XXXA Exposure to other specified factors, initial encounter: Secondary | ICD-10-CM | POA: Diagnosis not present

## 2018-05-11 DIAGNOSIS — Y9389 Activity, other specified: Secondary | ICD-10-CM | POA: Insufficient documentation

## 2018-05-11 DIAGNOSIS — Y9289 Other specified places as the place of occurrence of the external cause: Secondary | ICD-10-CM | POA: Insufficient documentation

## 2018-05-11 DIAGNOSIS — Z86718 Personal history of other venous thrombosis and embolism: Secondary | ICD-10-CM | POA: Insufficient documentation

## 2018-05-11 DIAGNOSIS — Y998 Other external cause status: Secondary | ICD-10-CM | POA: Insufficient documentation

## 2018-05-11 DIAGNOSIS — S8011XA Contusion of right lower leg, initial encounter: Secondary | ICD-10-CM

## 2018-05-11 LAB — BASIC METABOLIC PANEL WITH GFR
Anion gap: 6 (ref 5–15)
BUN: 10 mg/dL (ref 6–20)
CO2: 23 mmol/L (ref 22–32)
Calcium: 8.5 mg/dL — ABNORMAL LOW (ref 8.9–10.3)
Chloride: 108 mmol/L (ref 98–111)
Creatinine, Ser: 0.61 mg/dL (ref 0.44–1.00)
GFR calc Af Amer: >60 mL/min
GFR calc non Af Amer: >60 mL/min
Glucose, Bld: 80 mg/dL (ref 70–99)
Potassium: 3.7 mmol/L (ref 3.5–5.1)
Sodium: 137 mmol/L (ref 135–145)

## 2018-05-11 LAB — CBC
HCT: 36.9 % (ref 36.0–46.0)
Hemoglobin: 12 g/dL (ref 12.0–15.0)
MCH: 28.6 pg (ref 26.0–34.0)
MCHC: 32.5 g/dL (ref 30.0–36.0)
MCV: 87.9 fL (ref 80.0–100.0)
Platelets: 330 K/uL (ref 150–400)
RBC: 4.2 MIL/uL (ref 3.87–5.11)
RDW: 14.4 % (ref 11.5–15.5)
WBC: 5.7 K/uL (ref 4.0–10.5)
nRBC: 0 % (ref 0.0–0.2)

## 2018-05-11 LAB — APTT: aPTT: 30 s (ref 24–36)

## 2018-05-11 LAB — PROTIME-INR
INR: 1 (ref 0.8–1.2)
Prothrombin Time: 13.2 seconds (ref 11.4–15.2)

## 2018-05-11 MED ORDER — ACETAMINOPHEN 500 MG PO TABS
1000.0000 mg | ORAL_TABLET | Freq: Once | ORAL | Status: AC
  Administered 2018-05-11: 1000 mg via ORAL
  Filled 2018-05-11: qty 2

## 2018-05-11 NOTE — ED Triage Notes (Signed)
Pt to ED via [facility] in for right leg pain that started this morning. Pt has a bruise on the right upper leg. Pt denies injury to the area. Pt is in NAD

## 2018-05-11 NOTE — ED Notes (Signed)
Patient transported to X-ray 

## 2018-05-11 NOTE — ED Provider Notes (Signed)
[facility] Emergency Department Provider Note  ____________________________________________  Time seen: Approximately 1:01 PM  I have reviewed the triage vital signs and the nursing notes.   HISTORY  Chief Complaint Leg Pain   HPI Debbie Sosa is a 43 y.o. 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  When she got home she noted a bruise in her right thigh.  She denies any trauma.  She noted mild amount of sharp pain.  She denies chest pain or shortness of breath.  She is able to ambulate.  She was concerned she could have another DVT so she went to urgent care and therefore was sent here for an ultrasound.  Past Medical History:  Diagnosis Date  . Asthma   . Pulmonary embolism [facility])     Patient Active Problem List   Diagnosis Date Noted  . Acute laryngitis 08/17/2016  . Dysphagia 08/17/2016  . Meningitis 08/17/2016  . 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 Diarrhea, unspecified 10/14/2015  . Left lower quadrant pain 10/14/2015  . Nausea and vomiting 10/14/2015  . 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  . 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 Achalasia 01/07/2015  . Central sleep apnea 01/07/2015  . Obesity (BMI 30-39.9) 01/07/2015  . GERD (gastroesophageal reflux disease) 12/25/2014  . Pulmonary embolism ([place])   . Plantar fasciitis of right foot 12/31/2013    Past Surgical History:  Procedure Laterality Date  . BREAST BIOPSY Left 2006   benign/clip  . BREAST BIOPSY Right 2015   benign/clip  . BREAST REDUCTION SURGERY    . CESAREAN SECTION    . CESAREAN SECTION    . CHOLECYSTECTOMY    . ESOPHAGOGASTRODUODENOSCOPY (EGD) WITH PROPOFOL N/A 10/04/2016   Procedure: ESOPHAGOGASTRODUODENOSCOPY (EGD) WITH PROPOFOL;  Surgeon: [name], MD;  Location: [facility] ENDOSCOPY;  Service:  Gastroenterology;  Laterality: N/A;  . ESOPHAGOGASTRODUODENOSCOPY (EGD) WITH PROPOFOL N/A 04/14/2017   Procedure: ESOPHAGOGASTRODUODENOSCOPY (EGD) WITH PROPOFOL;  Surgeon: [name], MD;  Location: [facility] ENDOSCOPY;  Service: Gastroenterology;  Laterality: N/A;  . GASTRIC BYPASS     01/2015  . KNEE SURGERY    . PLANTAR FASCIA SURGERY     x 3  . REDUCTION MAMMAPLASTY Bilateral 2005    Prior to Admission medications   Medication Sig Start Date End Date Taking? Authorizing Provider  albuterol (PROVENTIL HFA;VENTOLIN HFA) 108 (90 Base) MCG/ACT inhaler Inhale 1-2 puffs INH Q4-6hr prn for chest tightness, cough, wheezing, SOB/DOE. 03/16/15   [provider]  azithromycin (ZITHROMAX Z-PAK) 250 MG tablet Take 2 tablets on the first day.  Take 1 tablet by mouth for the following 4 days. 01/31/18   Lannie Fields, PA-C  clindamycin (CLEOCIN) 300 MG capsule Take 1 capsule (300 mg total) by mouth 4 (four) times daily. 11/07/16   Cuthriell, Charline Bills, PA-C  Diclofenac Sodium 3 % GEL Place onto the skin. 12/01/15   [provider]  EPINEPHrine (EPIPEN 2-PAK) 0.3 mg/0.3 mL IJ SOAJ injection EpiPen 2-Pak 0.3 mg/0.3 mL injection, auto-injector    [provider]  fluticasone (FLONASE) 50 MCG/ACT nasal spray once as needed.  05/08/14   [provider]  montelukast (SINGULAIR) 10 MG tablet Take by mouth. 08/20/15   [provider]  Multiple Vitamin (MULTI-VITAMINS) TABS Take by mouth.    [provider]  mupirocin ointment (BACTROBAN) 2 % Apply 1 application topically 3 (three) times daily. 12/31/17   Lorin Picket, PA-C  omeprazole (PRILOSEC) 40 MG capsule Take 1 capsule (40 mg total) by mouth daily. 08/17/16 10/17/16  [name], MD  predniSONE (DELTASONE) 50 MG tablet Take one 50 mg tablet once daily for the next five days. 01/31/18   Lannie Fields, PA-C  sucralfate (CARAFATE) 1 g tablet Take by mouth. 10/14/15   [provider]  tobramycin  (TOBREX) 0.3 % ophthalmic solution instill 1-2 drops into affected eye twice a day 07/07/16   [provider]  triamcinolone cream (KENALOG) 0.1 % Apply 1 application topically 2 (two) times daily. 12/31/17   Lorin Picket, PA-C    Allergies Cephalexin; Fexofenadine; Ketorolac; Morphine; Phenazopyridine; Promethazine; Contrast media [iodinated diagnostic agents]; Eggs or egg-derived products; Latex; Morphine and related; Other; Pyridium [phenazopyridine hcl]; Tramadol; Vioxx [rofecoxib]; Hepatitis b virus vaccines; Naproxen; Oxycodone-acetaminophen; and Sulfa antibiotics  Family History  Problem Relation Age of Onset  . Breast cancer Mother 67  . Breast cancer Cousin 30  . Breast cancer Cousin 58    Social History Social History   Tobacco Use  . Smoking status: Never Smoker  . Smokeless tobacco: Never Used  Substance Use Topics  . Alcohol use: No  . Drug use: No    Review of Systems  Constitutional: Negative for fever. Eyes: Negative for visual changes. ENT: Negative for sore throat. Neck: No neck pain  Cardiovascular: Negative for chest pain. Respiratory: Negative for shortness of breath. Gastrointestinal: Negative for abdominal pain, vomiting or diarrhea. Genitourinary: Negative for dysuria. Musculoskeletal: Negative for back pain. + R leg pain and bruise Skin: Negative for rash. Neurological: Negative for headaches, weakness or numbness. Psych: No SI or HI  ____________________________________________   PHYSICAL EXAM:  VITAL SIGNS: ED Triage Vitals  Enc Vitals Group     BP 05/11/18 1117 (!) 121/93     Pulse Rate 05/11/18 1117 83     Resp 05/11/18 1117 16     Temp 05/11/18 1117 98.5 F (36.9 C)     Temp Source 05/11/18 1117 Oral     SpO2 05/11/18 1117 99 %     Weight --      Height --      Head Circumference --      Peak Flow --      Pain Score 05/11/18 1113 6     Pain Loc --      Pain Edu? --      Excl. in [place]? --     Constitutional:  Alert and oriented. Well appearing and in no apparent distress. HEENT:      Head: Normocephalic and atraumatic.         Eyes: Conjunctivae are normal. Sclera is non-icteric.       Mouth/Throat: Mucous membranes are moist.       Neck: Supple with no signs of meningismus. Cardiovascular: Regular rate and rhythm. No murmurs, gallops, or rubs. 2+ symmetrical distal pulses are present in all extremities. No JVD. Respiratory: Normal respiratory effort. Lungs are clear to auscultation bilaterally. No wheezes, crackles, or rhonchi.  Gastrointestinal: Soft, non tender, and non distended with positive bowel sounds. No rebound or guarding. Musculoskeletal: There is a small bruise on the inner aspect of the R thigh with no deformities, no edema.  Neurologic: Normal speech and language. Face is symmetric. Moving all extremities. No gross focal neurologic deficits are appreciated. Skin: Skin is warm, dry and intact.  No rash noted. Psychiatric: Mood and affect are normal. Speech and behavior are normal.  ____________________________________________   LABS (all labs ordered are listed, but only abnormal results are displayed)  Labs Reviewed  BASIC METABOLIC PANEL - Abnormal; Notable for the following components:      Result Value   Calcium 8.5 (*)    All other components within normal limits  CBC  PROTIME-INR  APTT   ____________________________________________  EKG  none  ____________________________________________  RADIOLOGY  I have personally reviewed the images performed during this visit and I agree with the Radiologist's read.   Interpretation by Radiologist:  US Venous Img Lower Unilateral Right  Result Date: 05/11/2018 CLINICAL DATA:  Right lower extremity pain EXAM: RIGHT LOWER EXTREMITY VENOUS DOPPLER ULTRASOUND TECHNIQUE: Gray-scale sonography with graded compression, as well as color Doppler and duplex ultrasound were performed to evaluate the lower extremity deep venous  systems from the level of the common femoral vein and including the common femoral, femoral, profunda femoral, popliteal and calf veins including the posterior tibial, peroneal and gastrocnemius veins when visible. The superficial great saphenous vein was also interrogated. Spectral Doppler was utilized to evaluate flow at rest and with distal augmentation maneuvers in the common femoral, femoral and popliteal veins. COMPARISON:  None. FINDINGS: Contralateral Common Femoral Vein: Respiratory phasicity is normal and symmetric with the symptomatic side. No evidence of thrombus. Normal compressibility. Common Femoral Vein: No evidence of thrombus. Normal compressibility, respiratory phasicity and response to augmentation. Saphenofemoral Junction: No evidence of thrombus. Normal compressibility and flow on color Doppler imaging. Profunda Femoral Vein: No evidence of thrombus. Normal compressibility and flow on color Doppler imaging. Femoral Vein: No evidence of thrombus. Normal compressibility, respiratory phasicity and response to augmentation. Popliteal Vein: No evidence of thrombus. Normal compressibility, respiratory phasicity and response to augmentation. Calf Veins: No evidence of thrombus. Normal compressibility and flow on color Doppler imaging. Superficial Great Saphenous Vein: No evidence of thrombus. Normal compressibility. Venous Reflux:  None. Other Findings:  None. IMPRESSION: No evidence of deep venous thrombosis. Electronically Signed   By: Inez Catalina M.D.   On: 05/11/2018 12:05   Dg Femur Min 2 Views Right  Result Date: 05/11/2018 CLINICAL DATA:  43 year old female with right leg pain, recent bruising but no known injury. EXAM: RIGHT FEMUR 2 VIEWS COMPARISON:  None. FINDINGS: There is no evidence of fracture or other focal bone lesions. Soft tissues are unremarkable. An IUD projects over the anatomic pelvis. IMPRESSION: Negative. Electronically Signed   By: Jacqulynn Cadet M.D.   On: 05/11/2018  11:53     ____________________________________________   PROCEDURES  Procedure(s) performed: None Procedures Critical Care performed:  None ____________________________________________   INITIAL IMPRESSION / ASSESSMENT AND PLAN / ED COURSE   43 y.o. 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  Ultrasound negative for DVT, x-ray negative for fracture.  Most likely bruising from minor trauma.  Pain is controlled with Tylenol.  Labs with no evidence of anemia or thrombocytopenia, coags within normal limits.  Will discharge home with follow-up with primary care doctor on supportive care      As part of my medical decision making, I reviewed the following data within the [place] notes reviewed and incorporated, Labs reviewed , Old chart reviewed, Radiograph reviewed , Notes from prior ED visits and [place] Controlled Substance Database    Pertinent labs & imaging results that were available during  my care of the patient were reviewed by me and considered in my medical decision making (see chart for details).    ____________________________________________   FINAL CLINICAL IMPRESSION(S) / ED DIAGNOSES  Final diagnoses:  Contusion of right lower leg, initial encounter      NEW MEDICATIONS STARTED DURING THIS VISIT:  ED Discharge Orders    None       Note:  This document was prepared using Dragon voice recognition software and may include unintentional dictation errors.    Alfred Levins, [place], MD 05/11/18 [id number]

## 2018-07-09 ENCOUNTER — Telehealth: Payer: Self-pay | Admitting: Gastroenterology

## 2018-07-09 NOTE — Telephone Encounter (Signed)
Patient LVM that she needs a f/u procedure (6 mths)

## 2018-07-11 NOTE — Telephone Encounter (Signed)
Spoke with pt regarding her follow up endoscopy procedure. Pt states she came across and old letter from our office regarding her endoscopy procedure. I explained to pt that according to Dr. Georgeann Oppenheim instructions she is not due for a follow up EGD procedure until spring of 2021. Pt agrees.

## 2018-07-26 ENCOUNTER — Other Ambulatory Visit: Payer: Self-pay | Admitting: *Deleted

## 2018-07-26 DIAGNOSIS — Z20822 Contact with and (suspected) exposure to covid-19: Secondary | ICD-10-CM

## 2018-07-31 LAB — NOVEL CORONAVIRUS, NAA: SARS-CoV-2, NAA: NOT DETECTED

## 2018-08-07 ENCOUNTER — Emergency Department
Admission: EM | Admit: 2018-08-07 | Discharge: 2018-08-07 | Disposition: A | Discharge status: Home / Self Care | Payer: BC Managed Care – PPO | Attending: Emergency Medicine | Admitting: Emergency Medicine

## 2018-08-07 ENCOUNTER — Other Ambulatory Visit: Payer: Self-pay

## 2018-08-07 ENCOUNTER — Encounter: Payer: Self-pay | Admitting: Emergency Medicine

## 2018-08-07 DIAGNOSIS — Z9884 Bariatric surgery status: Secondary | ICD-10-CM | POA: Insufficient documentation

## 2018-08-07 DIAGNOSIS — T7840XA Allergy, unspecified, initial encounter: Secondary | ICD-10-CM | POA: Diagnosis not present

## 2018-08-07 DIAGNOSIS — J45909 Unspecified asthma, uncomplicated: Secondary | ICD-10-CM | POA: Insufficient documentation

## 2018-08-07 DIAGNOSIS — Z91040 Latex allergy status: Secondary | ICD-10-CM | POA: Diagnosis not present

## 2018-08-07 DIAGNOSIS — Z79899 Other long term (current) drug therapy: Secondary | ICD-10-CM | POA: Diagnosis not present

## 2018-08-07 DIAGNOSIS — R0989 Other specified symptoms and signs involving the circulatory and respiratory systems: Secondary | ICD-10-CM | POA: Diagnosis present

## 2018-08-07 MED ORDER — PREDNISONE 10 MG (21) PO TBPK: ORAL_TABLET | ORAL | 0 refills | Status: DC

## 2018-08-07 NOTE — Discharge Instructions (Addendum)
Please seek medical attention for any high fevers, chest pain, shortness of breath, change in behavior, persistent vomiting, bloody stool or any other new or concerning symptoms.  

## 2018-08-07 NOTE — ED Triage Notes (Signed)
Pt in via [facility] from home, reports allergic reaction after having seasonal allergy shot today, denies any hx of same.  Pt reports feeling as if throat was closing, shortness of breath, nausea.  Pt used EpiPen at home PTA.  Nausea has resolved upon arrival, no facial swelling noted at this time.  Vitals WDL.

## 2018-08-07 NOTE — ED Notes (Addendum)
Pt updated and given juice and crackers.

## 2018-08-07 NOTE — ED Provider Notes (Signed)
[facility] Emergency Department Provider Note  ____________________________________________   I have reviewed the triage vital signs and the nursing notes.   HISTORY  Chief Complaint Allergic Reaction   History limited by: Not Limited   HPI Debbie Sosa is a 43 y.o. female who presents to the emergency department today because of concerns for allergic reaction.  The patient states that she had an allergy shot today.  There is a slightly higher dose than she normally gets.  After going home she started developing some nausea.  She then developing some throat tightness.  She did take an epi shot at home.  EMS then gave her further medications.  By the time my exam she states that she no longer has any nausea.  She does not feel any throat tightness but has some throat soreness.She denies any recent illness.    Records reviewed. Per medical record review patient has a history of asthma, PEs.   Past Medical History:  Diagnosis Date  . Asthma   . Pulmonary embolism [facility])     Patient Active Problem List   Diagnosis Date Noted  . Acute laryngitis 08/17/2016  . Dysphagia 08/17/2016  . Meningitis 08/17/2016  . 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 Diarrhea, unspecified 10/14/2015  . Left lower quadrant pain 10/14/2015  . Nausea and vomiting 10/14/2015  . 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  . 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 Achalasia 01/07/2015  . Central sleep apnea 01/07/2015  . Obesity (BMI 30-39.9) 01/07/2015  . GERD (gastroesophageal reflux disease) 12/25/2014  . Pulmonary embolism ([place])   . Plantar fasciitis of right foot 12/31/2013    Past Surgical History:  Procedure Laterality Date  . BREAST BIOPSY Left 2006   benign/clip  . BREAST BIOPSY Right 2015   benign/clip  . BREAST REDUCTION SURGERY    . CESAREAN SECTION    . CESAREAN SECTION    . CHOLECYSTECTOMY    . ESOPHAGOGASTRODUODENOSCOPY (EGD) WITH PROPOFOL N/A 10/04/2016   Procedure:  ESOPHAGOGASTRODUODENOSCOPY (EGD) WITH PROPOFOL;  Surgeon: [name], MD;  Location: [facility] ENDOSCOPY;  Service: Gastroenterology;  Laterality: N/A;  . ESOPHAGOGASTRODUODENOSCOPY (EGD) WITH PROPOFOL N/A 04/14/2017   Procedure: ESOPHAGOGASTRODUODENOSCOPY (EGD) WITH PROPOFOL;  Surgeon: [name], MD;  Location: [facility] ENDOSCOPY;  Service: Gastroenterology;  Laterality: N/A;  . GASTRIC BYPASS     01/2015  . KNEE SURGERY    . PLANTAR FASCIA SURGERY     x 3  . REDUCTION MAMMAPLASTY Bilateral 2005    Prior to Admission medications   Medication Sig Start Date End Date Taking? Authorizing Provider  albuterol (PROVENTIL HFA;VENTOLIN HFA) 108 (90 Base) MCG/ACT inhaler Inhale 1-2 puffs INH Q4-6hr prn for chest tightness, cough, wheezing, SOB/DOE. 03/16/15   [provider]  azithromycin (ZITHROMAX Z-PAK) 250 MG tablet Take 2 tablets on the first day.  Take 1 tablet by mouth for the following 4 days. 01/31/18   Lannie Fields, PA-C  clindamycin (CLEOCIN) 300 MG capsule Take 1 capsule (300 mg total) by mouth 4 (four) times daily. 11/07/16   Cuthriell, Charline Bills, PA-C  Diclofenac Sodium 3 % GEL Place onto the skin. 12/01/15   [provider]  EPINEPHrine (EPIPEN 2-PAK) 0.3 mg/0.3 mL IJ SOAJ injection EpiPen 2-Pak 0.3 mg/0.3 mL injection, auto-injector    [provider]  fluticasone (FLONASE) 50 MCG/ACT nasal spray once as needed.  05/08/14   [provider]  montelukast (SINGULAIR) 10 MG tablet Take by mouth. 08/20/15   [provider]  Multiple Vitamin (  MULTI-VITAMINS) TABS Take by mouth.    [provider]  mupirocin ointment (BACTROBAN) 2 % Apply 1 application topically 3 (three) times daily. 12/31/17   Lorin Picket, PA-C  omeprazole (PRILOSEC) 40 MG capsule Take 1 capsule (40 mg total) by mouth daily. 08/17/16 10/17/16  [name], MD  predniSONE (DELTASONE) 50 MG tablet Take one 50 mg tablet once daily for the next five days. 01/31/18   Lannie Fields, PA-C  sucralfate (CARAFATE) 1 g tablet Take by mouth. 10/14/15   [provider]  tobramycin (TOBREX) 0.3 % ophthalmic solution instill 1-2 drops into affected eye twice a day 07/07/16   [provider]  triamcinolone cream (KENALOG) 0.1 % Apply 1 application topically 2 (two) times daily. 12/31/17   Lorin Picket, PA-C    Allergies Cephalexin, Fexofenadine, Ketorolac, Morphine, Phenazopyridine, Promethazine, Contrast media [iodinated diagnostic agents], Eggs or egg-derived products, Latex, Morphine and related, Other, Pyridium [phenazopyridine hcl], Tramadol, Vioxx [rofecoxib], Hepatitis b virus vaccines, Naproxen, Oxycodone-acetaminophen, and Sulfa antibiotics  Family History  Problem Relation Age of Onset  . Breast cancer Mother 71  . Breast cancer Cousin 30  . Breast cancer Cousin 7    Social History Social History   Tobacco Use  . Smoking status: Never Smoker  . Smokeless tobacco: Never Used  Substance Use Topics  . Alcohol use: No  . Drug use: No    Review of Systems Constitutional: No fever/chills Eyes: No visual changes. ENT: Positive for throat tightness. Positive for sore throat.  Cardiovascular: Denies chest pain. Respiratory: Denies shortness of breath. Gastrointestinal: No abdominal pain.  Positive for nausea.   Genitourinary: Negative for dysuria. Musculoskeletal: Negative for back pain. Skin: Negative for rash. Neurological: Negative for headaches, focal weakness or numbness.  ____________________________________________   PHYSICAL EXAM:  VITAL SIGNS: ED Triage Vitals  Enc Vitals Group     BP 106/48     Pulse 90     Resp 19     Temp 98.4     Temp src      SpO2 100   Constitutional: Alert and oriented.  Eyes: Conjunctivae are normal.  ENT      Head: Normocephalic and atraumatic.      Nose: No congestion/rhinnorhea.      Mouth/Throat: Mucous membranes are moist.      Neck: No  stridor. Hematological/Lymphatic/Immunilogical: No cervical lymphadenopathy. Cardiovascular: Normal rate, regular rhythm.  No murmurs, rubs, or gallops.  Respiratory: Normal respiratory effort without tachypnea nor retractions. Breath sounds are clear and equal bilaterally. No wheezes/rales/rhonchi. Gastrointestinal: Soft and non tender. No rebound. No guarding.  Genitourinary: Deferred Musculoskeletal: Normal range of motion in all extremities. No lower extremity edema. Neurologic:  Normal speech and language. No gross focal neurologic deficits are appreciated.  Skin:  Skin is warm, dry and intact. No rash noted. Psychiatric: Mood and affect are normal. Speech and behavior are normal. Patient exhibits appropriate insight and judgment.  ____________________________________________    LABS (pertinent positives/negatives)  None  ____________________________________________   EKG  None  ____________________________________________    RADIOLOGY  None  ____________________________________________   PROCEDURES  Procedures  ____________________________________________   INITIAL IMPRESSION / ASSESSMENT AND PLAN / ED COURSE  Pertinent labs & imaging results that were available during my care of the patient were reviewed by me and considered in my medical decision making (see chart for details).   Patient presented to the emergency department today because of concern for allergic reaction. S/p multiple medications prior to my  evaluation. Observed in the emergency department for a number of hours without any recurrence. Patient does state that she has another epipen at home and still has refills on the prescription. Will give patient prescription for steroids.    ____________________________________________   FINAL CLINICAL IMPRESSION(S) / ED DIAGNOSES  Final diagnoses:  Allergic reaction, initial encounter     Note: This dictation was prepared with Dragon dictation.  Any transcriptional errors that result from this process are unintentional     Nance Pear, MD 08/07/18 [id number]

## 2018-08-11 ENCOUNTER — Other Ambulatory Visit: Payer: Self-pay

## 2018-08-11 ENCOUNTER — Emergency Department: Payer: BC Managed Care – PPO

## 2018-08-11 ENCOUNTER — Encounter: Payer: Self-pay | Admitting: Emergency Medicine

## 2018-08-11 DIAGNOSIS — W2209XA Striking against other stationary object, initial encounter: Secondary | ICD-10-CM | POA: Insufficient documentation

## 2018-08-11 DIAGNOSIS — Z79899 Other long term (current) drug therapy: Secondary | ICD-10-CM | POA: Diagnosis not present

## 2018-08-11 DIAGNOSIS — Y9302 Activity, running: Secondary | ICD-10-CM | POA: Diagnosis not present

## 2018-08-11 DIAGNOSIS — S99922A Unspecified injury of left foot, initial encounter: Secondary | ICD-10-CM | POA: Diagnosis present

## 2018-08-11 DIAGNOSIS — Y999 Unspecified external cause status: Secondary | ICD-10-CM | POA: Diagnosis not present

## 2018-08-11 DIAGNOSIS — Z91040 Latex allergy status: Secondary | ICD-10-CM | POA: Diagnosis not present

## 2018-08-11 DIAGNOSIS — Y92018 Other place in single-family (private) house as the place of occurrence of the external cause: Secondary | ICD-10-CM | POA: Diagnosis not present

## 2018-08-11 DIAGNOSIS — S90122A Contusion of left lesser toe(s) without damage to nail, initial encounter: Secondary | ICD-10-CM | POA: Diagnosis not present

## 2018-08-11 DIAGNOSIS — J45909 Unspecified asthma, uncomplicated: Secondary | ICD-10-CM | POA: Insufficient documentation

## 2018-08-11 NOTE — ED Triage Notes (Signed)
Pt to triage via wheelchair. Pt reports she was running down her hall and hit a weight with her left foot. Pain across the top of her foot at the base of the 3rd, 4th, and 5th toes. CMS intact.

## 2018-08-12 ENCOUNTER — Emergency Department
Admission: EM | Admit: 2018-08-12 | Discharge: 2018-08-12 | Disposition: A | Discharge status: Home / Self Care | Payer: BC Managed Care – PPO | Attending: Emergency Medicine | Admitting: Emergency Medicine

## 2018-08-12 DIAGNOSIS — S90122A Contusion of left lesser toe(s) without damage to nail, initial encounter: Secondary | ICD-10-CM

## 2018-08-12 MED ORDER — HYDROCODONE-ACETAMINOPHEN 5-325 MG PO TABS: 1.0000 | ORAL_TABLET | ORAL | 0 refills | Status: DC | PRN

## 2018-08-12 MED ORDER — HYDROCODONE-ACETAMINOPHEN 5-325 MG PO TABS: 1.0000 | ORAL_TABLET | Freq: Once | ORAL | Status: DC

## 2018-08-12 NOTE — ED Notes (Signed)
Dr. Owens Shark to triage 3 to assess patient.

## 2018-08-12 NOTE — ED Notes (Signed)
Patient states that she was running and kicked a weight with her left foot. Patient with pain to lateral side of left foot and top of left foot. Patient positive for pedal pulse to left foot.

## 2018-08-12 NOTE — ED Provider Notes (Signed)
[facility] Emergency Department Provider Note  ____________________________________________   First MD Initiated Contact with Patient 08/12/18 0030     (approximate)  I have reviewed the triage vital signs and the nursing notes.   HISTORY  Chief Complaint Foot Pain    HPI Debbie Sosa is a 43 y.o. female with below list of previous medical conditions presents to the emergency department secondary to left foot pain status post injury tonight.  Patient states that she struck her left foot against a weight while she was running down the hallway at her home.  Patient admits to current 9 out of 10 foot pain worse with any movement or attempted ambulation.        Past Medical History:  Diagnosis Date  . Asthma   . Pulmonary embolism [facility])     Patient Active Problem List   Diagnosis Date Noted  . Acute laryngitis 08/17/2016  . Dysphagia 08/17/2016  . Meningitis 08/17/2016  . 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 Diarrhea, unspecified 10/14/2015  . Left lower quadrant pain 10/14/2015  . Nausea and vomiting 10/14/2015  . 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  . 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 Achalasia 01/07/2015  . Central sleep apnea 01/07/2015  . Obesity (BMI 30-39.9) 01/07/2015  . GERD (gastroesophageal reflux disease) 12/25/2014  . Pulmonary embolism ([place])   . Plantar fasciitis of right foot 12/31/2013    Past Surgical History:  Procedure Laterality Date  . BREAST BIOPSY Left 2006   benign/clip  . BREAST BIOPSY Right 2015   benign/clip  . BREAST REDUCTION SURGERY    . CESAREAN SECTION    . CESAREAN SECTION    . CHOLECYSTECTOMY    . ESOPHAGOGASTRODUODENOSCOPY (EGD) WITH PROPOFOL N/A 10/04/2016   Procedure: ESOPHAGOGASTRODUODENOSCOPY (EGD) WITH PROPOFOL;  Surgeon: [name], MD;  Location: [facility] ENDOSCOPY;  Service: Gastroenterology;  Laterality: N/A;  . ESOPHAGOGASTRODUODENOSCOPY (EGD) WITH PROPOFOL N/A 04/14/2017   Procedure:  ESOPHAGOGASTRODUODENOSCOPY (EGD) WITH PROPOFOL;  Surgeon: [name], MD;  Location: [facility] ENDOSCOPY;  Service: Gastroenterology;  Laterality: N/A;  . GASTRIC BYPASS     01/2015  . KNEE SURGERY    . PLANTAR FASCIA SURGERY     x 3  . REDUCTION MAMMAPLASTY Bilateral 2005    Prior to Admission medications   Medication Sig Start Date End Date Taking? Authorizing Provider  albuterol (PROVENTIL HFA;VENTOLIN HFA) 108 (90 Base) MCG/ACT inhaler Inhale 1-2 puffs INH Q4-6hr prn for chest tightness, cough, wheezing, SOB/DOE. 03/16/15   [provider]  azithromycin (ZITHROMAX Z-PAK) 250 MG tablet Take 2 tablets on the first day.  Take 1 tablet by mouth for the following 4 days. 01/31/18   Lannie Fields, PA-C  clindamycin (CLEOCIN) 300 MG capsule Take 1 capsule (300 mg total) by mouth 4 (four) times daily. 11/07/16   Cuthriell, Charline Bills, PA-C  Diclofenac Sodium 3 % GEL Place onto the skin. 12/01/15   [provider]  EPINEPHrine (EPIPEN 2-PAK) 0.3 mg/0.3 mL IJ SOAJ injection EpiPen 2-Pak 0.3 mg/0.3 mL injection, auto-injector    [provider]  fluticasone (FLONASE) 50 MCG/ACT nasal spray once as needed.  05/08/14   [provider]  montelukast (SINGULAIR) 10 MG tablet Take by mouth. 08/20/15   [provider]  Multiple Vitamin (MULTI-VITAMINS) TABS Take by mouth.    [provider]  mupirocin ointment (BACTROBAN) 2 % Apply 1 application topically 3 (three) times daily. 12/31/17   Lorin Picket, PA-C  omeprazole (PRILOSEC) 40 MG capsule Take 1  capsule (40 mg total) by mouth daily. 08/17/16 10/17/16  [name], MD  predniSONE (DELTASONE) 50 MG tablet Take one 50 mg tablet once daily for the next five days. 01/31/18   Lannie Fields, PA-C  predniSONE (STERAPRED UNI-PAK 21 TAB) 10 MG (21) TBPK tablet Per packaging instructions 08/07/18   Nance Pear, MD  sucralfate (CARAFATE) 1 g tablet Take by mouth. 10/14/15   [provider]  tobramycin  (TOBREX) 0.3 % ophthalmic solution instill 1-2 drops into affected eye twice a day 07/07/16   [provider]  triamcinolone cream (KENALOG) 0.1 % Apply 1 application topically 2 (two) times daily. 12/31/17   Lorin Picket, PA-C    Allergies Cephalexin, Fexofenadine, Ketorolac, Morphine, Phenazopyridine, Promethazine, Contrast media [iodinated diagnostic agents], Eggs or egg-derived products, Latex, Morphine and related, Other, Pyridium [phenazopyridine hcl], Tramadol, Vioxx [rofecoxib], Hepatitis b virus vaccines, Naproxen, Oxycodone-acetaminophen, and Sulfa antibiotics  Family History  Problem Relation Age of Onset  . Breast cancer Mother 79  . Breast cancer Cousin 30  . Breast cancer Cousin 52    Social History Social History   Tobacco Use  . Smoking status: Never Smoker  . Smokeless tobacco: Never Used  Substance Use Topics  . Alcohol use: No  . Drug use: No    Review of Systems Constitutional: No fever/chills Eyes: No visual changes. ENT: No sore throat. Cardiovascular: Denies chest pain. Respiratory: Denies shortness of breath. Gastrointestinal: No abdominal pain.  No nausea, no vomiting.  No diarrhea.  No constipation. Genitourinary: Negative for dysuria. Musculoskeletal: Negative for neck pain.  Negative for back pain.  Positive for left foot pain Integumentary: Negative for rash. Neurological: Negative for headaches, focal weakness or numbness.   ____________________________________________   PHYSICAL EXAM:  VITAL SIGNS: ED Triage Vitals  Enc Vitals Group     BP 08/11/18 2241 102/80     Pulse Rate 08/11/18 2241 70     Resp 08/11/18 2241 18     Temp 08/11/18 2241 98.4 F (36.9 C)     Temp Source 08/11/18 2241 Oral     SpO2 08/11/18 2241 96 %     Weight 08/11/18 2242 81.6 kg (180 lb)     Height 08/11/18 2242 1.575 m (5\' 2" )     Head Circumference --      Peak Flow --      Pain Score 08/11/18 2242 9     Pain Loc --      Pain Edu? --       Excl. in [place]? --     Constitutional: Alert and oriented. Well appearing and in no acute distress. Eyes: Conjunctivae are normal.  Mouth/Throat: Mucous membranes are moist.  Oropharynx non-erythematous. Neck: No stridor.   Musculoskeletal: Pain to palpation dorsal aspect of the left foot base of the third fourth and fifth toe.  No knee pain with palpation. Neurologic:  Normal speech and language. No gross focal neurologic deficits are appreciated.  Skin:  Skin is warm, dry and intact. No rash noted. Psychiatric: Mood and affect are normal. Speech and behavior are normal.   RADIOLOGY I, [place], personally viewed and evaluated these images (plain radiographs) as part of my medical decision making, as well as reviewing the written report by the radiologist.  ED MD interpretation: No acute abnormality noted on left foot x-ray per radiologist.  Official radiology report(s): Dg Foot Complete Left  Result Date: 08/11/2018 CLINICAL DATA:  Was running down the hall and hit a weight with  third LEFT foot, pain across the top of her foot at the bases of the third fourth and fifth toes EXAM: LEFT FOOT - COMPLETE 3+ VIEW COMPARISON:  12/01/2015 FINDINGS: Osseous demineralization. Joint spaces preserved. Mild chronic lateral subluxation and old fracture fragment at PIP joint fifth toe. No acute fracture, dislocation or bone destruction. IMPRESSION: No acute abnormalities. Electronically Signed   By: Lavonia Dana M.D.   On: 08/11/2018 23:11      Procedures   ____________________________________________   INITIAL IMPRESSION / MDM / [place] / ED COURSE  As part of my medical decision making, I reviewed the following data within the electronic MEDICAL RECORD NUMBER 43 year old female presenting with above-stated history and physical exam secondary to left foot pain status post accidental injury.  No evidence of fracture dislocation noted on x-ray per radiologist.  Patient given Norco  in the emergency department secondary to discomfort.      ____________________________________________  FINAL CLINICAL IMPRESSION(S) / ED DIAGNOSES  Final diagnoses:  Contusion of fifth toe of left foot, initial encounter     MEDICATIONS GIVEN DURING THIS VISIT:  Medications  HYDROcodone-acetaminophen (NORCO/VICODIN) 5-325 MG per tablet 1 tablet (has no administration in time range)     ED Discharge Orders    None      *Please note:  Debbie Sosa was evaluated in Emergency Department on 08/12/2018 for the symptoms described in the history of present illness. She was evaluated in the context of the global COVID-19 pandemic, which necessitated consideration that the patient might be at risk for infection with the SARS-CoV-2 virus that causes COVID-19. Institutional protocols and algorithms that pertain to the evaluation of patients at risk for COVID-19 are in a state of rapid change based on information released by regulatory bodies including the CDC and federal and state organizations. These policies and algorithms were followed during the patient's care in the ED.  Some ED evaluations and interventions may be delayed as a result of limited staffing during the pandemic.*  Note:  This document was prepared using Dragon voice recognition software and may include unintentional dictation errors.   Gregor Hams, MD 08/12/18 [phone]

## 2018-10-16 ENCOUNTER — Encounter (INDEPENDENT_AMBULATORY_CARE_PROVIDER_SITE_OTHER): Payer: Self-pay

## 2018-10-16 ENCOUNTER — Other Ambulatory Visit: Payer: Self-pay

## 2018-10-16 ENCOUNTER — Encounter: Payer: Self-pay | Admitting: Gastroenterology

## 2018-10-16 ENCOUNTER — Ambulatory Visit: Payer: BC Managed Care – PPO | Admitting: Gastroenterology

## 2018-10-16 VITALS — BP 109/74 | HR 73 | Temp 98.1°F | Ht 62.0 in | Wt 199.2 lb

## 2018-10-16 DIAGNOSIS — R1012 Left upper quadrant pain: Secondary | ICD-10-CM | POA: Diagnosis not present

## 2018-10-16 DIAGNOSIS — R194 Change in bowel habit: Secondary | ICD-10-CM

## 2018-10-16 DIAGNOSIS — K5900 Constipation, unspecified: Secondary | ICD-10-CM

## 2018-10-16 MED ORDER — OMEPRAZOLE 40 MG PO CPDR: 40.0000 mg | DELAYED_RELEASE_CAPSULE | Freq: Every day | ORAL | 1 refills | Status: DC

## 2018-10-16 NOTE — Progress Notes (Signed)
Debbie Bellows MD, MRCP(U.K) [address]  [place]  [place], [place] [address]  Main: [phone]  Fax: [phone]   Primary Care Physician: Debbie Collard, MD  Primary Gastroenterologist:  Dr. Jonathon Sosa     HPI: Debbie Sosa is a 43 y.o. female   Summary of history :  She was last seen in 09/05/2016 for diverticulitis.  At that point of time she had a left upper quadrant pain.  History of gastric bypass in the past.  Occurred every few days.  Pain could last for hours and at times few days.  Left side of the abdomen localized, worse with usually within 15 minutes.  Relieved by laying down.  No NSAID use.  Relieved with a bowel movement.  Lots of bloating.  03/2017: EGD- normal gastric by pass anatomy .  Biopsies of stomach showed atrophic gastritis and a repeat EGD in 3 years 09/05/2016: H. pylori stool antigen negative  Interval history    At her last visit I felt she had an element of pain from constipation.  She says that she was doing fine but then developed abdominal pain followed by worsening of constipation since March April of this year.  She says that the discomfort is in the upper part of her chest epigastric region usually begins right after eating.  Very similar to her presentation in the past.  She also has left upper quadrant discomfort similar to when she presented in 2018.  She does not have a bowel movement every day although she tries to have 1.  She has tried a few over-the-counter medications including MiraLAX which have not worked.  She states that she had constipation followed by diarrhea.  She has a family history of colon polyps and multiple family members.  She says that her colonoscopy was performed some years back but she is overdue for 1.  Denies NSAID use.  Not on any PPI at this point of time.  Current Outpatient Medications  Medication Sig Dispense Refill  . albuterol (PROVENTIL HFA;VENTOLIN HFA) 108 (90 Base) MCG/ACT inhaler Inhale 1-2  puffs INH Q4-6hr prn for chest tightness, cough, wheezing, SOB/DOE.    Marland Kitchen azithromycin (ZITHROMAX Z-PAK) 250 MG tablet Take 2 tablets on the first day.  Take 1 tablet by mouth for the following 4 days. 6 each 0  . clindamycin (CLEOCIN) 300 MG capsule Take 1 capsule (300 mg total) by mouth 4 (four) times daily. 28 capsule 0  . Diclofenac Sodium 3 % GEL Place onto the skin.    Marland Kitchen EPINEPHrine (EPIPEN 2-PAK) 0.3 mg/0.3 mL IJ SOAJ injection EpiPen 2-Pak 0.3 mg/0.3 mL injection, auto-injector    . fluticasone (FLONASE) 50 MCG/ACT nasal spray once as needed.     Marland Kitchen HYDROcodone-acetaminophen (NORCO/VICODIN) 5-325 MG tablet Take 1 tablet by mouth every 4 (four) hours as needed for up to 6 doses for moderate pain. 6 tablet 0  . montelukast (SINGULAIR) 10 MG tablet Take by mouth.    . Multiple Vitamin (MULTI-VITAMINS) TABS Take by mouth.    . mupirocin ointment (BACTROBAN) 2 % Apply 1 application topically 3 (three) times daily. 22 g 0  . omeprazole (PRILOSEC) 40 MG capsule Take 1 capsule (40 mg total) by mouth daily. 30 capsule 2  . predniSONE (DELTASONE) 50 MG tablet Take one 50 mg tablet once daily for the next five days. 5 tablet 0  . predniSONE (STERAPRED UNI-PAK 21 TAB) 10 MG (21) TBPK tablet Per packaging instructions 21 tablet 0  .  sucralfate (CARAFATE) 1 g tablet Take by mouth.    . tobramycin (TOBREX) 0.3 % ophthalmic solution instill 1-2 drops into affected eye twice a day  0  . triamcinolone cream (KENALOG) 0.1 % Apply 1 application topically 2 (two) times daily. 30 g 0   No current facility-administered medications for this visit.     Allergies as of 10/16/2018 - Review Complete 08/11/2018  Allergen Reaction Noted  . Cephalexin Hives and Rash 03/12/2014  . Fexofenadine Shortness Of Breath   . Ketorolac Hives 03/12/2014  . Morphine Anaphylaxis 07/01/2013  . Phenazopyridine Rash   . Promethazine Hives 03/12/2014  . Contrast media [iodinated diagnostic agents] Hives 05/23/2016  . Eggs or  egg-derived products  07/01/2013  . Latex  06/18/2014  . Morphine and related  06/18/2014  . Other Hives 02/23/2013  . Pyridium [phenazopyridine hcl]  06/18/2014  . Tramadol  06/18/2014  . Vioxx [rofecoxib]  06/18/2014  . Hepatitis b virus vaccines Rash 07/01/2013  . Naproxen Rash 07/01/2013  . Oxycodone-acetaminophen Itching 03/12/2014  . Sulfa antibiotics Rash 06/18/2014    ROS:  General: Negative for anorexia, weight loss, fever, chills, fatigue, weakness. ENT: Negative for hoarseness, difficulty swallowing , nasal congestion. CV: Negative for chest pain, angina, palpitations, dyspnea on exertion, peripheral edema.  Respiratory: Negative for dyspnea at rest, dyspnea on exertion, cough, sputum, wheezing.  GI: See history of present illness. GU:  Negative for dysuria, hematuria, urinary incontinence, urinary frequency, nocturnal urination.  Endo: Negative for unusual weight change.    Physical Examination:   There were no vitals taken for this visit.  General: Well-nourished, well-developed in no acute distress.  Eyes: No icterus. Conjunctivae pink. Mouth: Oropharyngeal mucosa moist and pink , no lesions erythema or exudate. Lungs: Clear to auscultation bilaterally. Non-labored. Heart: Regular rate and rhythm, no murmurs rubs or gallops.  Abdomen: Bowel sounds are normal, nontender, nondistended, no hepatosplenomegaly or masses, no abdominal bruits or hernia , no rebound or guarding.   Extremities: No lower extremity edema. No clubbing or deformities. Neuro: Alert and oriented x 3.  Grossly intact. Skin: Warm and dry, no jaundice.   Psych: Alert and cooperative, normal mood and affect.   Imaging Studies: No results found.  Assessment and Plan:   Debbie Sosa is a 43 y.o. y/o female with a prior history of Roux-en-Y gastric bypass, gastric intestinal metaplasia.  History of constipation likely IBS-C.  Returns to my office after 2 years with upper abdominal discomfort  likely from either acid reflux or esophagitis or an anastomotic ulcer.  She also has left upper quadrant discomfort which in the past I felt was secondary to her IBS-C.  Her constipation is not well controlled.  She has tried over-the-counter medications with limited success.  She does not have a gallbladder.  Plan 1.  Commence on Linzess 145 mcg: Samples will be provided.  She has been advised to call my office in 7 to 10 days if she does not respond appropriately. 2.  EGD with colonoscopy.  EGD toevaluate for abdominal pain and colonoscopy due to constipation.  She also has a family history of colon polyps. 3.  Commence on Prilosec 40 mg once a day.  I have discussed alternative options, risks & benefits,  which include, but are not limited to, bleeding, infection, perforation,respiratory complication & drug reaction.  The patient agrees with this plan & written consent will be obtained.       Dr Debbie Bellows  MD,MRCP [facility]) Follow up in 4  weeks

## 2018-10-16 NOTE — Patient Instructions (Signed)

## 2018-10-26 ENCOUNTER — Other Ambulatory Visit
Admission: RE | Admit: 2018-10-26 | Discharge: 2018-10-26 | Disposition: A | Discharge status: Home / Self Care | Payer: BC Managed Care – PPO | Source: Ambulatory Visit | Attending: Gastroenterology | Admitting: Gastroenterology

## 2018-10-26 DIAGNOSIS — Z01812 Encounter for preprocedural laboratory examination: Secondary | ICD-10-CM | POA: Insufficient documentation

## 2018-10-26 DIAGNOSIS — Z20828 Contact with and (suspected) exposure to other viral communicable diseases: Secondary | ICD-10-CM | POA: Insufficient documentation

## 2018-10-26 LAB — SARS CORONAVIRUS 2 (TAT 6-24 HRS): SARS Coronavirus 2: NEGATIVE

## 2018-10-31 ENCOUNTER — Ambulatory Visit: Payer: BC Managed Care – PPO | Admitting: Certified Registered Nurse Anesthetist

## 2018-10-31 ENCOUNTER — Encounter
Admission: RE | Disposition: A | Discharge status: Home / Self Care | Payer: Self-pay | Source: Home / Self Care | Attending: Gastroenterology

## 2018-10-31 ENCOUNTER — Ambulatory Visit
Admission: RE | Admit: 2018-10-31 | Discharge: 2018-10-31 | Disposition: A | Discharge status: Home / Self Care | Payer: BC Managed Care – PPO | Attending: Gastroenterology | Admitting: Gastroenterology

## 2018-10-31 ENCOUNTER — Other Ambulatory Visit: Payer: Self-pay

## 2018-10-31 DIAGNOSIS — K5900 Constipation, unspecified: Secondary | ICD-10-CM | POA: Diagnosis not present

## 2018-10-31 DIAGNOSIS — Z888 Allergy status to other drugs, medicaments and biological substances status: Secondary | ICD-10-CM | POA: Diagnosis not present

## 2018-10-31 DIAGNOSIS — J45909 Unspecified asthma, uncomplicated: Secondary | ICD-10-CM | POA: Insufficient documentation

## 2018-10-31 DIAGNOSIS — Z882 Allergy status to sulfonamides status: Secondary | ICD-10-CM | POA: Diagnosis not present

## 2018-10-31 DIAGNOSIS — Z885 Allergy status to narcotic agent status: Secondary | ICD-10-CM | POA: Insufficient documentation

## 2018-10-31 DIAGNOSIS — K635 Polyp of colon: Secondary | ICD-10-CM | POA: Diagnosis not present

## 2018-10-31 DIAGNOSIS — Z79899 Other long term (current) drug therapy: Secondary | ICD-10-CM | POA: Diagnosis not present

## 2018-10-31 DIAGNOSIS — G4731 Primary central sleep apnea: Secondary | ICD-10-CM | POA: Insufficient documentation

## 2018-10-31 DIAGNOSIS — K2950 Unspecified chronic gastritis without bleeding: Secondary | ICD-10-CM | POA: Insufficient documentation

## 2018-10-31 DIAGNOSIS — Z881 Allergy status to other antibiotic agents status: Secondary | ICD-10-CM | POA: Diagnosis not present

## 2018-10-31 DIAGNOSIS — Z9884 Bariatric surgery status: Secondary | ICD-10-CM | POA: Diagnosis not present

## 2018-10-31 DIAGNOSIS — Z886 Allergy status to analgesic agent status: Secondary | ICD-10-CM | POA: Diagnosis not present

## 2018-10-31 DIAGNOSIS — K219 Gastro-esophageal reflux disease without esophagitis: Secondary | ICD-10-CM | POA: Insufficient documentation

## 2018-10-31 DIAGNOSIS — R1012 Left upper quadrant pain: Secondary | ICD-10-CM | POA: Diagnosis not present

## 2018-10-31 DIAGNOSIS — Z6836 Body mass index (BMI) 36.0-36.9, adult: Secondary | ICD-10-CM | POA: Diagnosis not present

## 2018-10-31 DIAGNOSIS — E669 Obesity, unspecified: Secondary | ICD-10-CM | POA: Insufficient documentation

## 2018-10-31 HISTORY — PX: COLONOSCOPY WITH PROPOFOL: SHX5780

## 2018-10-31 HISTORY — PX: ESOPHAGOGASTRODUODENOSCOPY (EGD) WITH PROPOFOL: SHX5813

## 2018-10-31 LAB — POCT PREGNANCY, URINE: Preg Test, Ur: NEGATIVE

## 2018-10-31 SURGERY — COLONOSCOPY WITH PROPOFOL
Anesthesia: General

## 2018-10-31 MED ORDER — PROPOFOL 10 MG/ML IV BOLUS
INTRAVENOUS | Status: DC | PRN
  Administered 2018-10-31: 40 mg via INTRAVENOUS
  Administered 2018-10-31: 20 mg via INTRAVENOUS

## 2018-10-31 MED ORDER — SODIUM CHLORIDE 0.9 % IV SOLN
INTRAVENOUS | Status: DC
  Administered 2018-10-31: 13:00:00 via INTRAVENOUS

## 2018-10-31 MED ORDER — LIDOCAINE HCL (CARDIAC) PF 100 MG/5ML IV SOSY
PREFILLED_SYRINGE | INTRAVENOUS | Status: DC | PRN
  Administered 2018-10-31: 50 mg via INTRAVENOUS

## 2018-10-31 MED ORDER — PROPOFOL 500 MG/50ML IV EMUL
INTRAVENOUS | Status: DC | PRN
  Administered 2018-10-31: 150 ug/kg/min via INTRAVENOUS

## 2018-10-31 MED ORDER — PHENYLEPHRINE HCL (PRESSORS) 10 MG/ML IV SOLN
INTRAVENOUS | Status: DC | PRN
  Administered 2018-10-31: 200 ug via INTRAVENOUS

## 2018-10-31 MED ORDER — PROPOFOL 10 MG/ML IV BOLUS
INTRAVENOUS | Status: AC
  Filled 2018-10-31: qty 20

## 2018-10-31 MED ORDER — PROPOFOL 500 MG/50ML IV EMUL
INTRAVENOUS | Status: AC
  Filled 2018-10-31: qty 50

## 2018-10-31 NOTE — Anesthesia Postprocedure Evaluation (Signed)
Anesthesia Post Note  Patient: Debbie Sosa  Procedure(s) Performed: COLONOSCOPY WITH PROPOFOL (N/A ) ESOPHAGOGASTRODUODENOSCOPY (EGD) WITH PROPOFOL (N/A )  Patient location during evaluation: Endoscopy Anesthesia Type: General Level of consciousness: awake and alert Pain management: pain level controlled Vital Signs Assessment: post-procedure vital signs reviewed and stable Respiratory status: spontaneous breathing and respiratory function stable Cardiovascular status: stable Anesthetic complications: no     Last Vitals:  Vitals:   10/31/18 1320 10/31/18 1404  BP: 108/83 (!) 96/50  Pulse: 67 91  Resp: 20 20  Temp: (!) 35.9 C (!) 36.4 C  SpO2: 100% 94%    Last Pain:  Vitals:   10/31/18 1404  TempSrc:   PainSc: Asleep                 Ashton Belote K

## 2018-10-31 NOTE — H&P (Signed)
[name], MD [address], [place], [place], [place], [address] [address], [place], [place], [place], [address] Phone: [phone]  Fax: [phone]  Primary Care Physician:  Elgie Collard, MD   Pre-Procedure History & Physical: HPI:  Debbie Sosa is a 43 y.o. female is here for an endoscopy and colonoscopy    Past Medical History:  Diagnosis Date  . Asthma   . Pulmonary embolism [facility])     Past Surgical History:  Procedure Laterality Date  . BREAST BIOPSY Left 2006   benign/clip  . BREAST BIOPSY Right 2015   benign/clip  . BREAST REDUCTION SURGERY    . CESAREAN SECTION    . CESAREAN SECTION    . CHOLECYSTECTOMY    . COLONOSCOPY    . ESOPHAGOGASTRODUODENOSCOPY (EGD) WITH PROPOFOL N/A 10/04/2016   Procedure: ESOPHAGOGASTRODUODENOSCOPY (EGD) WITH PROPOFOL;  Surgeon: [name], MD;  Location: [facility] ENDOSCOPY;  Service: Gastroenterology;  Laterality: N/A;  . ESOPHAGOGASTRODUODENOSCOPY (EGD) WITH PROPOFOL N/A 04/14/2017   Procedure: ESOPHAGOGASTRODUODENOSCOPY (EGD) WITH PROPOFOL;  Surgeon: [name], MD;  Location: [facility] ENDOSCOPY;  Service: Gastroenterology;  Laterality: N/A;  . GASTRIC BYPASS     01/2015  . KNEE SURGERY    . NASAL SINUS SURGERY    . PLANTAR FASCIA SURGERY     x 3  . REDUCTION MAMMAPLASTY Bilateral 2005    Prior to Admission medications   Medication Sig Start Date End Date Taking? Authorizing Provider  montelukast (SINGULAIR) 10 MG tablet Take by mouth. 08/20/15  Yes [provider]  albuterol (PROVENTIL HFA;VENTOLIN HFA) 108 (90 Base) MCG/ACT inhaler Inhale 1-2 puffs INH Q4-6hr prn for chest tightness, cough, wheezing, SOB/DOE. 03/16/15   [provider]  azithromycin (ZITHROMAX Z-PAK) 250 MG tablet Take 2 tablets on the first day.  Take 1 tablet by mouth for the following 4 days. Patient not taking: Reported on 10/16/2018 01/31/18   Lannie Fields, PA-C  clindamycin (CLEOCIN) 300 MG capsule Take 1 capsule (300 mg  total) by mouth 4 (four) times daily. Patient not taking: Reported on 10/16/2018 11/07/16   Cuthriell, Charline Bills, PA-C  Diclofenac Sodium 3 % GEL Place onto the skin. 12/01/15   [provider]  EPINEPHrine (EPIPEN 2-PAK) 0.3 mg/0.3 mL IJ SOAJ injection EpiPen 2-Pak 0.3 mg/0.3 mL injection, auto-injector    [provider]  fluticasone (FLONASE) 50 MCG/ACT nasal spray once as needed.  05/08/14   [provider]  HYDROcodone-acetaminophen (NORCO/VICODIN) 5-325 MG tablet Take 1 tablet by mouth every 4 (four) hours as needed for up to 6 doses for moderate pain. Patient not taking: Reported on 10/16/2018 08/12/18   Gregor Hams, MD  Multiple Vitamin (MULTI-VITAMINS) TABS Take by mouth.    [provider]  mupirocin ointment (BACTROBAN) 2 % Apply 1 application topically 3 (three) times daily. Patient not taking: Reported on 10/16/2018 12/31/17   Lorin Picket, PA-C  omeprazole (PRILOSEC) 40 MG capsule Take 1 capsule (40 mg total) by mouth daily. 10/16/18 04/14/19  [name], MD  predniSONE (DELTASONE) 50 MG tablet Take one 50 mg tablet once daily for the next five days. Patient not taking: Reported on 10/16/2018 01/31/18   Lannie Fields, PA-C  predniSONE (STERAPRED UNI-PAK 21 TAB) 10 MG (21) TBPK tablet Per packaging instructions Patient not taking: Reported on 10/16/2018 08/07/18   Nance Pear, MD  sucralfate (CARAFATE) 1 g tablet Take by mouth. 10/14/15   [provider]  tobramycin (TOBREX) 0.3 % ophthalmic solution  instill 1-2 drops into affected eye twice a day 07/07/16   [provider]  triamcinolone cream (KENALOG) 0.1 % Apply 1 application topically 2 (two) times daily. 12/31/17   Lorin Picket, PA-C    Allergies as of 10/16/2018 - Review Complete 08/11/2018  Allergen Reaction Noted  . Cephalexin Hives and Rash 03/12/2014  . Fexofenadine Shortness Of Breath   . Ketorolac Hives 03/12/2014  . Morphine Anaphylaxis 07/01/2013   . Phenazopyridine Rash   . Promethazine Hives 03/12/2014  . Cetirizine Other (See Comments) 03/05/2018  . Contrast media [iodinated diagnostic agents] Hives 05/23/2016  . Eggs or egg-derived products  07/01/2013  . Latex  06/18/2014  . Morphine and related  06/18/2014  . Other Hives 02/23/2013  . Pyridium [phenazopyridine hcl]  06/18/2014  . Tramadol  06/18/2014  . Vioxx [rofecoxib]  06/18/2014  . Hepatitis b virus vaccines Rash 07/01/2013  . Naproxen Rash 07/01/2013  . Oxycodone-acetaminophen Itching 03/12/2014  . Sulfa antibiotics Rash 06/18/2014    Family History  Problem Relation Age of Onset  . Breast cancer Mother 30  . Breast cancer Cousin 30  . Breast cancer Cousin 3    Social History   Socioeconomic History  . Marital status: Single    Spouse name: Not on file  . Number of children: Not on file  . Years of education: Not on file  . Highest education level: Not on file  Occupational History  . Not on file  Social Needs  . Financial resource strain: Not on file  . Food insecurity    Worry: Not on file    Inability: Not on file  . Transportation needs    Medical: Not on file    Non-medical: Not on file  Tobacco Use  . Smoking status: Never Smoker  . Smokeless tobacco: Never Used  Substance and Sexual Activity  . Alcohol use: No  . Drug use: No  . Sexual activity: Not on file  Lifestyle  . Physical activity    Days per week: Not on file    Minutes per session: Not on file  . Stress: Not on file  Relationships  . Social Herbalist on phone: Not on file    Gets together: Not on file    Attends religious service: Not on file    Active member of club or organization: Not on file    Attends meetings of clubs or organizations: Not on file    Relationship status: Not on file  . Intimate partner violence    Fear of current or ex partner: Not on file    Emotionally abused: Not on file    Physically abused: Not on file    Forced sexual  activity: Not on file  Other Topics Concern  . Not on file  Social History Narrative  . Not on file    Review of Systems: See HPI, otherwise negative ROS  Physical Exam: BP 108/83   Pulse 67   Temp (!) 96.7 F (35.9 C) (Tympanic)   Resp 20   Ht 5\' 2"  (1.575 m)   Wt 90.3 kg   SpO2 100%   BMI 36.40 kg/m  General:   Alert,  pleasant and cooperative in NAD Head:  Normocephalic and atraumatic. Neck:  Supple; no masses or thyromegaly. Lungs:  Clear throughout to auscultation, normal respiratory effort.    Heart:  +S1, +S2, Regular rate and rhythm, No edema. Abdomen:  Soft, nontender and nondistended. Normal bowel sounds, without  guarding, and without rebound.   Neurologic:  Alert and  oriented x4;  grossly normal neurologically.  Impression/Plan: Debbie Sosa is here for an endoscopy and colonoscopy  to be performed for  evaluation of abdominal pain , constipation     Risks, benefits, limitations, and alternatives regarding endoscopy have been reviewed with the patient.  Questions have been answered.  All parties agreeable.   [name], MD  10/31/2018, 1:22 PM

## 2018-10-31 NOTE — Transfer of Care (Signed)
Immediate Anesthesia Transfer of Care Note  Patient: Debbie Sosa  Procedure(s) Performed: COLONOSCOPY WITH PROPOFOL (N/A ) ESOPHAGOGASTRODUODENOSCOPY (EGD) WITH PROPOFOL (N/A )  Patient Location: PACU  Anesthesia Type:General  Level of Consciousness: awake, alert  and oriented  Airway & Oxygen Therapy: Patient Spontanous Breathing and Patient connected to face mask oxygen  Post-op Assessment: Report given to RN and Post -op Vital signs reviewed and stable  Post vital signs: Reviewed and stable  Last Vitals:  Vitals Value Taken Time  BP    Temp    Pulse 91 10/31/18 1404  Resp 21 10/31/18 1404  SpO2 94 % 10/31/18 1404  Vitals shown include unvalidated device data.  Last Pain:  Vitals:   10/31/18 1320  TempSrc: Tympanic  PainSc: 0-No pain         Complications: No apparent anesthesia complications

## 2018-10-31 NOTE — Anesthesia Preprocedure Evaluation (Signed)
Anesthesia Evaluation  Patient identified by MRN, date of birth, ID band Patient awake    Reviewed: Allergy & Precautions, NPO status , Patient's Chart, lab work & pertinent test results  History of Anesthesia Complications Negative for: history of anesthetic complications  Airway Mallampati: II       Dental   Pulmonary asthma (with seasonal allergies) , neg sleep apnea (resolved with gastric bypass), Not current smoker,           Cardiovascular (-) hypertension(-) Past MI and (-) CHF (-) dysrhythmias (-) Valvular Problems/Murmurs     Neuro/Psych neg Seizures    GI/Hepatic Neg liver ROS, GERD  ,  Endo/Other  neg diabetes  Renal/GU negative Renal ROS     Musculoskeletal   Abdominal   Peds  Hematology   Anesthesia Other Findings   Reproductive/Obstetrics                             Anesthesia Physical Anesthesia Plan  ASA: II  Anesthesia Plan: General   Post-op Pain Management:    Induction:   PONV Risk Score and Plan: 3 and TIVA, Propofol infusion and Ondansetron  Airway Management Planned: Nasal Cannula  Additional Equipment:   Intra-op Plan:   Post-operative Plan:   Informed Consent: I have reviewed the patients History and Physical, chart, labs and discussed the procedure including the risks, benefits and alternatives for the proposed anesthesia with the patient or authorized representative who has indicated his/her understanding and acceptance.       Plan Discussed with:   Anesthesia Plan Comments:         Anesthesia Quick Evaluation

## 2018-10-31 NOTE — Op Note (Signed)
[facility] Gastroenterology Patient Name: Debbie Sosa Procedure Date: 10/31/2018 1:22 PM MRN: [id number] Account #: [id number] Date of Birth: 07-Sep-1975 Admit Type: Outpatient Age: 43 Room: [facility] ENDO ROOM 1 Gender: Female Note Status: Finalized Procedure:            Colonoscopy Indications:          Constipation Providers:            [name] MD, MD Medicines:            Monitored Anesthesia Care Complications:        No immediate complications. Procedure:            Pre-Anesthesia Assessment:                       - Prior to the procedure, a History and Physical was                        performed, and patient medications, allergies and                        sensitivities were reviewed. The patient's tolerance of                        previous anesthesia was reviewed.                       - The risks and benefits of the procedure and the                        sedation options and risks were discussed with the                        patient. All questions were answered and informed                        consent was obtained.                       - ASA Grade Assessment: II - A patient with mild                        systemic disease.                       After obtaining informed consent, the colonoscope was                        passed under direct vision. Throughout the procedure,                        the patient's blood pressure, pulse, and oxygen                        saturations were monitored continuously. The                        Colonoscope was introduced through the anus and                        advanced to the the cecum, identified by the  appendiceal orifice. The colonoscopy was performed with                        ease. The patient tolerated the procedure well. The                        quality of the bowel preparation was good. Findings:      The perianal and digital rectal examinations were normal.   Three sessile polyps were found in the descending colon. The polyps were       5 to 8 mm in size. These polyps were removed with a cold snare.       Resection and retrieval were complete.      The exam was otherwise without abnormality on direct and retroflexion       views. Impression:           - Three 5 to 8 mm polyps in the descending colon,                        removed with a cold snare. Resected and retrieved.                       - The examination was otherwise normal on direct and                        retroflexion views. Recommendation:       - Discharge patient to home (with escort).                       - Resume previous diet.                       - Continue present medications.                       - Await pathology results.                       - Return to my office as previously scheduled. Procedure Code(s):    --- Professional ---                       [phone], Colonoscopy, flexible; with removal of tumor(s),                        polyp(s), or other lesion(s) by snare technique Diagnosis Code(s):    --- Professional ---                       K63.5, Polyp of colon                       K59.00, Constipation, unspecified CPT copyright 2019 American Medical Association. All rights reserved. The codes documented in this report are preliminary and upon coder review may  be revised to meet current compliance requirements. [name], MD [name] MD, MD 10/31/2018 2:02:10 PM This report has been signed electronically. Number of Addenda: 0 Note Initiated On: 10/31/2018 1:22 PM Scope Withdrawal Time: 0 hours 14 minutes 4 seconds  Total Procedure Duration: 0 hours 19 minutes 35 seconds  Estimated Blood Loss: Estimated blood loss: none.      [facility]

## 2018-10-31 NOTE — Anesthesia Post-op Follow-up Note (Signed)
Anesthesia QCDR form completed.        

## 2018-10-31 NOTE — Op Note (Signed)
[facility] Gastroenterology Patient Name: Debbie Sosa Procedure Date: 10/31/2018 1:22 PM MRN: [id number] Account #: [id number] Date of Birth: [birth year] Admit Type: Outpatient Age: 43 Room: [facility] ENDO ROOM 1 Gender: Female Note Status: Finalized Procedure:            Upper GI endoscopy Indications:          Abdominal pain in the left upper quadrant Providers:            [name] MD, MD Medicines:            Monitored Anesthesia Care Complications:        No immediate complications. Procedure:            Pre-Anesthesia Assessment:                       - Prior to the procedure, a History and Physical was                        performed, and patient medications, allergies and                        sensitivities were reviewed. The patient's tolerance of                        previous anesthesia was reviewed.                       - The risks and benefits of the procedure and the                        sedation options and risks were discussed with the                        patient. All questions were answered and informed                        consent was obtained.                       - ASA Grade Assessment: II - A patient with mild                        systemic disease.                       After obtaining informed consent, the endoscope was                        passed under direct vision. Throughout the procedure,                        the patient's blood pressure, pulse, and oxygen                        saturations were monitored continuously. The Endoscope                        was introduced through the mouth, and advanced to the                        jejunum. The upper GI endoscopy was accomplished  with                        ease. The patient tolerated the procedure well. Findings:      The esophagus was normal.      Evidence of a Roux-en-Y gastrojejunostomy was found. The gastrojejunal       anastomosis was characterized by healthy  appearing mucosa. This was       traversed. The pouch-to-jejunum limb was characterized by healthy       appearing mucosa. The duodenum-to-jejunum limb was not examined as it       could not be found.      The examined jejunum was normal.      Normal mucosa was found in the entire examined stomach. This was       biopsied with a cold forceps for histology. Impression:           - Normal esophagus.                       - Roux-en-Y gastrojejunostomy with gastrojejunal                        anastomosis characterized by healthy appearing mucosa.                       - Normal examined jejunum.                       - Normal mucosa was found in the entire stomach.                        Biopsied. Recommendation:       - Await pathology results.                       - Perform a colonoscopy today. Procedure Code(s):    --- Professional ---                       [phone], Esophagogastroduodenoscopy, flexible, transoral;                        with biopsy, single or multiple Diagnosis Code(s):    --- Professional ---                       Z98.0, Intestinal bypass and anastomosis status                       R10.12, Left upper quadrant pain CPT copyright 2019 American Medical Association. All rights reserved. The codes documented in this report are preliminary and upon coder review may  be revised to meet current compliance requirements. [name], MD [name] MD, MD 10/31/2018 1:39:31 PM This report has been signed electronically. Number of Addenda: 0 Note Initiated On: 10/31/2018 1:22 PM Estimated Blood Loss: Estimated blood loss: none.      [facility]

## 2018-11-01 ENCOUNTER — Encounter: Payer: Self-pay | Admitting: Gastroenterology

## 2018-11-02 LAB — SURGICAL PATHOLOGY

## 2018-11-09 ENCOUNTER — Telehealth: Payer: Self-pay

## 2018-11-09 NOTE — Telephone Encounter (Signed)
Pt called to request biopsy result clarification.

## 2018-11-09 NOTE — Telephone Encounter (Signed)
MILD CHRONIC ACTIVE GASTRITIS.  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  - NEGATIVE FOR H. PYLORI ORGANISMS, DYSPLASIA AND MALIGNANCY.   B. COLON POLYP X3, DESCENDING; BIOPSIES:  - HYPERPLASTIC POLYP (3).  - NEGATIVE FOR DYSPLASIA AND MALIGNANCY.   Polyps of colon are benign and the stomach showed gastritis with some changes in the types of cells called intestinal metaplasia- suggest repeat EGD in 8 months for gastric mapping

## 2018-11-12 NOTE — Telephone Encounter (Signed)
Spoke with pt and informed her of biopsy results and Dr. Georgeann Oppenheim recommendations. Pt understands and agrees. Pt is aware that she'll receive a recall letter via mail.

## 2018-11-22 ENCOUNTER — Other Ambulatory Visit: Payer: Self-pay

## 2018-11-22 ENCOUNTER — Ambulatory Visit: Payer: BC Managed Care – PPO | Admitting: Gastroenterology

## 2018-11-22 VITALS — BP 107/76 | HR 76 | Temp 98.2°F | Ht 62.0 in | Wt 199.0 lb

## 2018-11-22 DIAGNOSIS — K3189 Other diseases of stomach and duodenum: Secondary | ICD-10-CM | POA: Diagnosis not present

## 2018-11-22 DIAGNOSIS — K31A0 Gastric intestinal metaplasia, unspecified: Secondary | ICD-10-CM

## 2018-11-22 DIAGNOSIS — R1012 Left upper quadrant pain: Secondary | ICD-10-CM

## 2018-11-22 DIAGNOSIS — K5900 Constipation, unspecified: Secondary | ICD-10-CM | POA: Diagnosis not present

## 2018-11-22 NOTE — Progress Notes (Signed)
[name] MD, MRCP(U.K) [address]  [place]  [place], [place] [address]  Main: [phone]  Fax: [phone]   Primary Care Physician: Elgie Collard, MD  Primary Gastroenterologist:  Dr. [name]   Abdominal pain   HPI: Debbie Sosa is a 43 y.o. female   Summary of history :  She is being followed for left upper quadrant pain.  She has a history of diverticulitis in 2018.  History of gastric bypass.  The abdominal pain Occurred every few days.  Pain could last for hours and at times few days.  Left side of the abdomen localized, worse with usually within 15 minutes.  Relieved by laying down.  No NSAID use.  Relieved with a bowel movement.  Lots of bloating.  I have previously felt that her pain could also be from constipation.  MiraLAX failed.  Family history of colon polyps.  03/2017: EGD- normal gastric by pass anatomy .  Biopsies of stomach showed atrophic gastritis and a repeat EGD in 3 years 09/05/2016: H. pylori stool antigen negative  Interval history   10/16/2018: 11/22/2018 10/31/2018: EGD: Normal-appearing Roux-en-Y gastric bypass.  Gastric biopsies showed intestinal metaplasia.  Colonoscopy: 3 hyperplastic polyps were resected. Denies NSAID use.  Not on any PPI at this point of time. He will have left-sided abdominal pain.  Did not pick up the Linzess samples after her endoscopy.  145 mcg caused her to have diarrhea.  The plan was to start her on 72 mcg.  She recollects on the day of a colonoscopy after her colon was clean she had absolutely no left-sided abdominal pain  Current Outpatient Medications  Medication Sig Dispense Refill  . albuterol (PROVENTIL HFA;VENTOLIN HFA) 108 (90 Base) MCG/ACT inhaler Inhale 1-2 puffs INH Q4-6hr prn for chest tightness, cough, wheezing, SOB/DOE.    Marland Kitchen azithromycin (ZITHROMAX Z-PAK) 250 MG tablet Take 2 tablets on the first day.  Take 1 tablet by mouth for the following 4 days. (Patient not taking: Reported on  10/16/2018) 6 each 0  . clindamycin (CLEOCIN) 300 MG capsule Take 1 capsule (300 mg total) by mouth 4 (four) times daily. (Patient not taking: Reported on 10/16/2018) 28 capsule 0  . Diclofenac Sodium 3 % GEL Place onto the skin.    Marland Kitchen EPINEPHrine (EPIPEN 2-PAK) 0.3 mg/0.3 mL IJ SOAJ injection EpiPen 2-Pak 0.3 mg/0.3 mL injection, auto-injector    . fluticasone (FLONASE) 50 MCG/ACT nasal spray once as needed.     Marland Kitchen HYDROcodone-acetaminophen (NORCO/VICODIN) 5-325 MG tablet Take 1 tablet by mouth every 4 (four) hours as needed for up to 6 doses for moderate pain. (Patient not taking: Reported on 10/16/2018) 6 tablet 0  . montelukast (SINGULAIR) 10 MG tablet Take by mouth.    . Multiple Vitamin (MULTI-VITAMINS) TABS Take by mouth.    . mupirocin ointment (BACTROBAN) 2 % Apply 1 application topically 3 (three) times daily. (Patient not taking: Reported on 10/16/2018) 22 g 0  . omeprazole (PRILOSEC) 40 MG capsule Take 1 capsule (40 mg total) by mouth daily. 90 capsule 1  . predniSONE (DELTASONE) 50 MG tablet Take one 50 mg tablet once daily for the next five days. (Patient not taking: Reported on 10/16/2018) 5 tablet 0  . predniSONE (STERAPRED UNI-PAK 21 TAB) 10 MG (21) TBPK tablet Per packaging instructions (Patient not taking: Reported on 10/16/2018) 21 tablet 0  . sucralfate (CARAFATE) 1 g tablet Take by mouth.    . tobramycin (TOBREX) 0.3 % ophthalmic solution instill 1-2 drops  into affected eye twice a day  0  . triamcinolone cream (KENALOG) 0.1 % Apply 1 application topically 2 (two) times daily. 30 g 0   No current facility-administered medications for this visit.     Allergies as of 11/22/2018 - Review Complete 10/31/2018  Allergen Reaction Noted  . Cephalexin Hives and Rash 03/12/2014  . Fexofenadine Shortness Of Breath   . Ketorolac Hives 03/12/2014  . Morphine Anaphylaxis 07/01/2013  . Phenazopyridine Rash   . Promethazine Hives 03/12/2014  . Cetirizine Other (See Comments) 03/05/2018  .  Contrast media [iodinated diagnostic agents] Hives 05/23/2016  . Eggs or egg-derived products  07/01/2013  . Latex  06/18/2014  . Morphine and related  06/18/2014  . Other Hives 02/23/2013  . Pyridium [phenazopyridine hcl]  06/18/2014  . Tramadol  06/18/2014  . Vioxx [rofecoxib]  06/18/2014  . Hepatitis b virus vaccines Rash 07/01/2013  . Naproxen Rash 07/01/2013  . Oxycodone-acetaminophen Itching 03/12/2014  . Sulfa antibiotics Rash 06/18/2014    ROS:  General: Negative for anorexia, weight loss, fever, chills, fatigue, weakness. ENT: Negative for hoarseness, difficulty swallowing , nasal congestion. CV: Negative for chest pain, angina, palpitations, dyspnea on exertion, peripheral edema.  Respiratory: Negative for dyspnea at rest, dyspnea on exertion, cough, sputum, wheezing.  GI: See history of present illness. GU:  Negative for dysuria, hematuria, urinary incontinence, urinary frequency, nocturnal urination.  Endo: Negative for unusual weight change.    Physical Examination:   There were no vitals taken for this visit.  General: Well-nourished, well-developed in no acute distress.  Eyes: No icterus. Conjunctivae pink. Mouth: Oropharyngeal mucosa moist and pink , no lesions erythema or exudate. Lungs: Clear to auscultation bilaterally. Non-labored. Heart: Regular rate and rhythm, no murmurs rubs or gallops.  Abdomen: Bowel sounds are normal, nontender, nondistended, no hepatosplenomegaly or masses, no abdominal bruits or hernia , no rebound or guarding.   Extremities: No lower extremity edema. No clubbing or deformities. Neuro: Alert and oriented x 3.  Grossly intact. Skin: Warm and dry, no jaundice.   Psych: Alert and cooperative, normal mood and affect.   Imaging Studies: No results found.  Assessment and Plan:   Debbie Sosa is a 43 y.o. y/o femalewith a prior history of Roux-en-Y gastric bypass, gastric intestinal metaplasia.  History of constipation likely  IBS-C.    Being followed for left upper quadrant pain.  Felt due to constipation.  She does not have a gallbladder.  EGD demonstrated gastric intestinal metaplasia in October 2020 and colonoscopy showed no gross abnormalities except hyperplastic polyps.  Plan 1.    Commence on 72 mcg of Linzess.  CT scan of the abdomen and pelvis.  She will call and cancel the CT scan if the Linzess resolves the pain. 2.   Commence on Prilosec 40 mg once a day. 3.  Gastric intestinal metaplasia repeat endoscopy with multiple biopsies in 8 months to a year. 4.  H. pylori breath test  Dr [name]  MD,MRCP [facility]) Follow up in 3 months

## 2018-11-22 NOTE — Addendum Note (Signed)
Addended by: Dorethea Clan on: 11/22/2018 02:55 PM   Modules accepted: Orders

## 2018-11-22 NOTE — Addendum Note (Signed)
Addended by: Dorethea Clan on: 11/22/2018 03:18 PM   Modules accepted: Orders

## 2018-11-25 LAB — H. PYLORI ANTIGEN, STOOL: H pylori Ag, Stl: NEGATIVE

## 2018-11-26 ENCOUNTER — Telehealth: Payer: Self-pay | Admitting: Gastroenterology

## 2018-11-26 ENCOUNTER — Encounter: Payer: Self-pay | Admitting: Gastroenterology

## 2018-11-26 NOTE — Telephone Encounter (Signed)
Pt left vm she was wondering when we will call in her contrast prescription for her CT scan ?

## 2018-11-27 NOTE — Telephone Encounter (Signed)
Spoke with pt and reminded her that she'll need to pick up the contrast from either the [facility] or the [facility] on [address]. Pt understands and agrees to pick up contrast at the [facility]

## 2018-11-28 ENCOUNTER — Other Ambulatory Visit: Payer: Self-pay

## 2018-11-28 ENCOUNTER — Telehealth: Payer: Self-pay

## 2018-11-28 MED ORDER — PREDNISONE 50 MG PO TABS: ORAL_TABLET | ORAL | 0 refills | Status: DC

## 2018-11-28 NOTE — Telephone Encounter (Signed)
Spoke with pt and informed her that because of her allergic reaction to CT contrast, per CT protocol, she'll need to commence on a 13 hour medication prep prior to her CT scan. I explained that she need to take Prednisone 50 mg at 13 hours, 7 hours and 1 hour prior to the exam as well as Bendryl 50 mg at 1 hour prior to exam. I explained that we have sent the prescription for Prednisone. Pt agrees and is aware she'll need to purchase the Benadryl over the counter.

## 2018-11-29 ENCOUNTER — Telehealth: Payer: Self-pay | Admitting: Gastroenterology

## 2018-11-29 NOTE — Telephone Encounter (Signed)
Tillie Rung from pre service center left vm to obtain pre authorization on Ct scan cb [phone] ext. [phone]

## 2018-12-04 ENCOUNTER — Ambulatory Visit: Admission: RE | Admit: 2018-12-04 | Payer: BC Managed Care – PPO | Source: Ambulatory Visit

## 2018-12-11 ENCOUNTER — Telehealth: Payer: Self-pay

## 2018-12-11 NOTE — Telephone Encounter (Signed)
Called patient to scheduled patient CT scan. Patient states she can do Wednesday afternoons or Friday afternoons. Got patient scheduled for 12/26/2018 [facility] 2[facility]. Patient will go pick up contrast and nothing to eat or drank starting at 11am. Patient verbalized understanding and states she will take the prednisone for her allergy to the contrast.

## 2018-12-26 ENCOUNTER — Ambulatory Visit
Admission: RE | Admit: 2018-12-26 | Discharge: 2018-12-26 | Disposition: A | Discharge status: Home / Self Care | Payer: BC Managed Care – PPO | Source: Ambulatory Visit | Attending: Obstetrics and Gynecology | Admitting: Obstetrics and Gynecology

## 2018-12-26 ENCOUNTER — Ambulatory Visit
Admission: RE | Admit: 2018-12-26 | Discharge: 2018-12-26 | Disposition: A | Discharge status: Home / Self Care | Payer: BC Managed Care – PPO | Source: Ambulatory Visit | Attending: Gastroenterology | Admitting: Gastroenterology

## 2018-12-26 ENCOUNTER — Other Ambulatory Visit: Payer: Self-pay

## 2018-12-26 DIAGNOSIS — Z1231 Encounter for screening mammogram for malignant neoplasm of breast: Secondary | ICD-10-CM | POA: Diagnosis present

## 2018-12-26 DIAGNOSIS — K3189 Other diseases of stomach and duodenum: Secondary | ICD-10-CM

## 2018-12-26 DIAGNOSIS — R1012 Left upper quadrant pain: Secondary | ICD-10-CM | POA: Diagnosis present

## 2018-12-26 DIAGNOSIS — K31A0 Gastric intestinal metaplasia, unspecified: Secondary | ICD-10-CM

## 2018-12-26 MED ORDER — IOHEXOL 300 MG/ML  SOLN
100.0000 mL | Freq: Once | INTRAMUSCULAR | Status: AC | PRN
  Administered 2018-12-26: 100 mL via INTRAVENOUS

## 2018-12-27 ENCOUNTER — Other Ambulatory Visit: Payer: Self-pay

## 2018-12-27 ENCOUNTER — Telehealth: Payer: Self-pay

## 2018-12-27 MED ORDER — HYOSCYAMINE SULFATE 0.125 MG PO TABS: 0.1250 mg | ORAL_TABLET | Freq: Three times a day (TID) | ORAL | 2 refills | Status: DC

## 2018-12-27 NOTE — Telephone Encounter (Signed)
-----   Message from [name], MD sent at 12/27/2018  8:19 AM EST ----- Small kidney stone but otherwise normal

## 2018-12-27 NOTE — Telephone Encounter (Signed)
Spoke with pt and informed her of CT scan result.

## 2018-12-27 NOTE — Progress Notes (Signed)
Small kidney stone but otherwise normal

## 2019-01-09 ENCOUNTER — Other Ambulatory Visit: Payer: BC Managed Care – PPO

## 2019-01-10 ENCOUNTER — Ambulatory Visit (HOSPITAL_COMMUNITY)
Admission: EM | Admit: 2019-01-10 | Discharge: 2019-01-10 | Disposition: A | Discharge status: Home / Self Care | Payer: BC Managed Care – PPO | Attending: Urgent Care | Admitting: Urgent Care

## 2019-01-10 ENCOUNTER — Other Ambulatory Visit: Payer: BC Managed Care – PPO

## 2019-01-10 ENCOUNTER — Other Ambulatory Visit: Payer: Self-pay

## 2019-01-10 ENCOUNTER — Ambulatory Visit: Payer: Self-pay

## 2019-01-10 DIAGNOSIS — R0602 Shortness of breath: Secondary | ICD-10-CM | POA: Insufficient documentation

## 2019-01-10 DIAGNOSIS — J45909 Unspecified asthma, uncomplicated: Secondary | ICD-10-CM | POA: Diagnosis not present

## 2019-01-10 DIAGNOSIS — Z886 Allergy status to analgesic agent status: Secondary | ICD-10-CM | POA: Insufficient documentation

## 2019-01-10 DIAGNOSIS — U071 COVID-19: Secondary | ICD-10-CM | POA: Insufficient documentation

## 2019-01-10 DIAGNOSIS — R1012 Left upper quadrant pain: Secondary | ICD-10-CM | POA: Insufficient documentation

## 2019-01-10 DIAGNOSIS — Z882 Allergy status to sulfonamides status: Secondary | ICD-10-CM | POA: Diagnosis not present

## 2019-01-10 DIAGNOSIS — Z86711 Personal history of pulmonary embolism: Secondary | ICD-10-CM | POA: Insufficient documentation

## 2019-01-10 DIAGNOSIS — Z888 Allergy status to other drugs, medicaments and biological substances status: Secondary | ICD-10-CM | POA: Insufficient documentation

## 2019-01-10 DIAGNOSIS — Z885 Allergy status to narcotic agent status: Secondary | ICD-10-CM | POA: Insufficient documentation

## 2019-01-10 DIAGNOSIS — R05 Cough: Secondary | ICD-10-CM | POA: Insufficient documentation

## 2019-01-10 DIAGNOSIS — Z887 Allergy status to serum and vaccine status: Secondary | ICD-10-CM | POA: Insufficient documentation

## 2019-01-10 DIAGNOSIS — Z79899 Other long term (current) drug therapy: Secondary | ICD-10-CM | POA: Diagnosis not present

## 2019-01-10 DIAGNOSIS — Z91040 Latex allergy status: Secondary | ICD-10-CM | POA: Diagnosis not present

## 2019-01-10 DIAGNOSIS — R059 Cough, unspecified: Secondary | ICD-10-CM

## 2019-01-10 MED ORDER — ALBUTEROL SULFATE HFA 108 (90 BASE) MCG/ACT IN AERS: INHALATION_SPRAY | RESPIRATORY_TRACT | 0 refills | Status: AC

## 2019-01-10 MED ORDER — BENZONATATE 100 MG PO CAPS: 100.0000 mg | ORAL_CAPSULE | Freq: Three times a day (TID) | ORAL | 0 refills | Status: DC | PRN

## 2019-01-10 NOTE — ED Provider Notes (Signed)
[place]   MRN: [id number] DOB: 1975-06-26  Subjective:   Debbie Sosa is a 43 y.o. female presenting for 1 day history of cute onset moderate to severe body aches, shortness of breath and dry cough.  Patient has taken over-the-counter medications with some relief.  Denies any known COVID-19 contacts.  No current facility-administered medications for this encounter.  Current Outpatient Medications:  .  albuterol (PROVENTIL HFA;VENTOLIN HFA) 108 (90 Base) MCG/ACT inhaler, Inhale 1-2 puffs INH Q4-6hr prn for chest tightness, cough, wheezing, SOB/DOE., Disp: , Rfl:  .  azithromycin (ZITHROMAX Z-PAK) 250 MG tablet, Take 2 tablets on the first day.  Take 1 tablet by mouth for the following 4 days. (Patient not taking: Reported on 10/16/2018), Disp: 6 each, Rfl: 0 .  clindamycin (CLEOCIN) 300 MG capsule, Take 1 capsule (300 mg total) by mouth 4 (four) times daily. (Patient not taking: Reported on 10/16/2018), Disp: 28 capsule, Rfl: 0 .  Diclofenac Sodium 3 % GEL, Place onto the skin., Disp: , Rfl:  .  EPINEPHrine (EPIPEN 2-PAK) 0.3 mg/0.3 mL IJ SOAJ injection, EpiPen 2-Pak 0.3 mg/0.3 mL injection, auto-injector, Disp: , Rfl:  .  fluticasone (FLONASE) 50 MCG/ACT nasal spray, once as needed. , Disp: , Rfl:  .  HYDROcodone-acetaminophen (NORCO/VICODIN) 5-325 MG tablet, Take 1 tablet by mouth every 4 (four) hours as needed for up to 6 doses for moderate pain. (Patient not taking: Reported on 10/16/2018), Disp: 6 tablet, Rfl: 0 .  hyoscyamine (LEVSIN) 0.125 MG tablet, Take 1 tablet (0.125 mg total) by mouth 3 (three) times daily., Disp: 90 tablet, Rfl: 2 .  montelukast (SINGULAIR) 10 MG tablet, Take by mouth., Disp: , Rfl:  .  Multiple Vitamin (MULTI-VITAMINS) TABS, Take by mouth., Disp: , Rfl:  .  mupirocin ointment (BACTROBAN) 2 %, Apply 1 application topically 3 (three) times daily. (Patient not taking: Reported on 10/16/2018), Disp: 22 g, Rfl: 0 .  omeprazole (PRILOSEC) 40 MG capsule,  Take 1 capsule (40 mg total) by mouth daily., Disp: 90 capsule, Rfl: 1 .  predniSONE (DELTASONE) 50 MG tablet, Take 1 tab at 13 hours, 7 hours, and 1 hour prior to exam, Disp: 3 tablet, Rfl: 0 .  sucralfate (CARAFATE) 1 g tablet, Take by mouth., Disp: , Rfl:  .  tobramycin (TOBREX) 0.3 % ophthalmic solution, instill 1-2 drops into affected eye twice a day, Disp: , Rfl: 0 .  triamcinolone cream (KENALOG) 0.1 %, Apply 1 application topically 2 (two) times daily., Disp: 30 g, Rfl: 0   Allergies  Allergen Reactions  . Cephalexin Hives and Rash  . Fexofenadine Shortness Of Breath  . Ketorolac Hives  . Morphine Anaphylaxis  . Phenazopyridine Rash  . Promethazine Hives  . Cetirizine Other (See Comments)    Skin eruptures  . Contrast Media [Iodinated Diagnostic Agents] Hives  . Eggs Or Egg-Derived Products     Other reaction(s): RASH Other reaction(s): RASH  . Latex   . Morphine And Related   . Other Hives    Pain medicine that starts with an L  . Pyridium [Phenazopyridine Hcl]   . Tramadol   . Vioxx [Rofecoxib]   . Hepatitis B Virus Vaccines Rash  . Naproxen Rash  . Oxycodone-Acetaminophen Itching  . Sulfa Antibiotics Rash    Past Medical History:  Diagnosis Date  . Asthma   . Pulmonary embolism [facility])      Past Surgical History:  Procedure Laterality Date  . BREAST BIOPSY Left 2006   benign/clip  .  BREAST BIOPSY Right 2015   benign/clip  . BREAST REDUCTION SURGERY    . CESAREAN SECTION    . CESAREAN SECTION    . CHOLECYSTECTOMY    . COLONOSCOPY    . COLONOSCOPY WITH PROPOFOL N/A 10/31/2018   Procedure: COLONOSCOPY WITH PROPOFOL;  Surgeon: [name], MD;  Location: [facility] ENDOSCOPY;  Service: Gastroenterology;  Laterality: N/A;  . ESOPHAGOGASTRODUODENOSCOPY (EGD) WITH PROPOFOL N/A 10/04/2016   Procedure: ESOPHAGOGASTRODUODENOSCOPY (EGD) WITH PROPOFOL;  Surgeon: [name], MD;  Location: [facility] ENDOSCOPY;  Service: Gastroenterology;  Laterality: N/A;  .  ESOPHAGOGASTRODUODENOSCOPY (EGD) WITH PROPOFOL N/A 04/14/2017   Procedure: ESOPHAGOGASTRODUODENOSCOPY (EGD) WITH PROPOFOL;  Surgeon: [name], MD;  Location: [facility] ENDOSCOPY;  Service: Gastroenterology;  Laterality: N/A;  . ESOPHAGOGASTRODUODENOSCOPY (EGD) WITH PROPOFOL N/A 10/31/2018   Procedure: ESOPHAGOGASTRODUODENOSCOPY (EGD) WITH PROPOFOL;  Surgeon: [name], MD;  Location: [facility] ENDOSCOPY;  Service: Gastroenterology;  Laterality: N/A;  . GASTRIC BYPASS     01/2015  . KNEE SURGERY    . NASAL SINUS SURGERY    . PLANTAR FASCIA SURGERY     x 3  . REDUCTION MAMMAPLASTY Bilateral 2005    Family History  Problem Relation Age of Onset  . Breast cancer Mother 67  . Breast cancer Cousin 30  . Breast cancer Cousin 75    Social History   Tobacco Use  . Smoking status: Never Smoker  . Smokeless tobacco: Never Used  Substance Use Topics  . Alcohol use: No  . Drug use: No    Review of Systems  Constitutional: Positive for malaise/fatigue. Negative for fever.  HENT: Negative for congestion, ear pain, sinus pain and sore throat.   Eyes: Negative for discharge and redness.  Respiratory: Positive for cough and shortness of breath. Negative for hemoptysis and wheezing.   Cardiovascular: Negative for chest pain.  Gastrointestinal: Negative for abdominal pain, diarrhea, nausea and vomiting.  Genitourinary: Negative for dysuria, flank pain and hematuria.  Musculoskeletal: Positive for myalgias.  Skin: Negative for rash.  Neurological: Negative for dizziness, weakness and headaches.  Psychiatric/Behavioral: Negative for depression and substance abuse.     Objective:   Vitals: BP 134/90 (BP Location: Left Arm)   Pulse 87   Temp 98.1 F (36.7 C) (Oral)   Resp 16   SpO2 98%   Physical Exam Constitutional:      General: She is not in acute distress.    Appearance: Normal appearance. She is well-developed. She is not ill-appearing, toxic-appearing or diaphoretic.  HENT:      Head: Normocephalic and atraumatic.     Nose: Nose normal.     Mouth/Throat:     Mouth: Mucous membranes are moist.  Eyes:     Extraocular Movements: Extraocular movements intact.     Pupils: Pupils are equal, round, and reactive to light.  Cardiovascular:     Rate and Rhythm: Normal rate and regular rhythm.     Pulses: Normal pulses.     Heart sounds: Normal heart sounds. No murmur. No friction rub. No gallop.   Pulmonary:     Effort: Pulmonary effort is normal. No respiratory distress.     Breath sounds: Normal breath sounds. No stridor. No wheezing, rhonchi or rales.  Skin:    General: Skin is warm and dry.     Findings: No rash.  Neurological:     Mental Status: She is alert and oriented to person, place, and time.  Psychiatric:        Mood and Affect: Mood normal.  Behavior: Behavior normal.        Thought Content: Thought content normal.      Assessment and Plan :   1. Cough   2. Shortness of breath   3. History of pulmonary embolism   4. LUQ abdominal pain     Will manage for viral illness such as viral URI, viral rhinitis, possible COVID-19. Counseled patient on nature of COVID-19 including modes of transmission, diagnostic testing, management and supportive care.  Offered symptomatic relief. COVID 19 testing is pending. Counseled patient on potential for adverse effects with medications prescribed/recommended today, ER and return-to-clinic precautions discussed, patient verbalized understanding.     Jaynee Eagles, PA-C 01/12/19 1120

## 2019-01-10 NOTE — Discharge Instructions (Addendum)
We will manage this as a viral syndrome. For sore throat or cough try using a honey-based tea. Use 3 teaspoons of honey with juice squeezed from half lemon. Place shaved pieces of ginger into 1/2-1 cup of water and warm over stove top. Then mix the ingredients and repeat every 4 hours as needed. Please take Tylenol 500mg  every 6 hours. Hydrate very well with at least 2 liters of water. Eat light meals such as soups to replenish electrolytes and soft fruits, veggies. Start an antihistamine like Zyrtec (cetirizine) at 10mg  daily for postnasal drainage, sinus congestion.  You can take this together with pseudoephedrine (Sudafed) at a dose of 60 mg 3 times a day or twice daily as needed for the same kind of congestion.

## 2019-01-10 NOTE — Telephone Encounter (Signed)
Incoming call from Patient, with a complaint of SOB denies fever. Onset was yesterday.States it comes and goes.  History of asthma., dizziness. Reviewed protocol.  Recommended Patient go to Urgent care for evaluation.   Voiced understanding.            Reason for Disposition . [1] MILD difficulty breathing (e.g., minimal/no SOB at rest, SOB with walking, pulse <100) AND [2] NEW-onset or WORSE than normal  Answer Assessment - Initial Assessment Questions 1. RESPIRATORY STATUS: "Describe your breathing?" (e.g., wheezing, shortness of breath, unable to speak, severe coughing)      SOB 2. ONSET: "When did this breathing problem begin?"      yesterday 3. PATTERN "Does the difficult breathing come and go, or has it been constant since it started?"      *No Answer*constant 4. SEVERITY: "How bad is your breathing?" (e.g., mild, moderate, severe)    - MILD: No SOB at rest, mild SOB with walking, speaks normally in sentences, can lay down, no retractions, pulse < 100.    - MODERATE: SOB at rest, SOB with minimal exertion and prefers to sit, cannot lie down flat, speaks in phrases, mild retractions, audible wheezing, pulse 100-120.    - SEVERE: Very SOB at rest, speaks in single words, struggling to breathe, sitting hunched forward, retractions, pulse > 120      Hurts to take a deep breath 5. RECURRENT SYMPTOM: "Have you had difficulty breathing before?" If so, ask: "When was the last time?" and "What happened that time?"      Lung isues 6. CARDIAC HISTORY: "Do you have any history of heart disease?" (e.g., heart attack, angina, bypass surgery, angioplasty)      denies 7. LUNG HISTORY: "Do you have any history of lung disease?"  (e.g., pulmonary embolus, asthma, emphysema)     asthma 8. CAUSE: "What do you think is causing the breathing problem?"       9. OTHER SYMPTOMS: "Do you have any other symptoms? (e.g., dizziness, runny nose, cough, chest pain, fever)   dizziness 10. PREGNANCY: "Is  there any chance you are pregnant?" "When was your last menstrual period?"      [place] 11. TRAVEL: "Have you traveled out of the country in the last month?" (e.g., travel history, exposures)       na  Protocols used: BREATHING DIFFICULTY-A-AH

## 2019-01-10 NOTE — ED Triage Notes (Signed)
Patient presents to Urgent Care with complaints of shortness of breath, cough, and body aches since yesterday.

## 2019-01-11 LAB — NOVEL CORONAVIRUS, NAA (HOSP ORDER, SEND-OUT TO REF LAB; TAT 18-24 HRS): SARS-CoV-2, NAA: DETECTED — AB

## 2019-01-12 ENCOUNTER — Other Ambulatory Visit: Payer: Self-pay | Admitting: Unknown Physician Specialty

## 2019-01-12 ENCOUNTER — Telehealth: Payer: Self-pay | Admitting: Unknown Physician Specialty

## 2019-01-12 DIAGNOSIS — U071 COVID-19: Secondary | ICD-10-CM

## 2019-01-12 NOTE — Telephone Encounter (Signed)
Erroneous encounter

## 2019-01-12 NOTE — Telephone Encounter (Signed)
  I connected by phone with Debbie Sosa on 01/12/2019 at 10:25 AM to discuss the potential use of an new treatment for mild to moderate COVID-19 viral infection in non-hospitalized patients.  This patient is a 43 y.o. female that meets the FDA criteria for Emergency Use Authorization of bamlanivimab or casirivimab\imdevimab.  Has a (+) direct SARS-CoV-2 viral test result  Has mild or moderate COVID-19   Is ? 43 years of age and weighs ? 40 kg  Is NOT hospitalized due to COVID-19  Is NOT requiring oxygen therapy or requiring an increase in baseline oxygen flow rate due to COVID-19  Is within 10 days of symptom onset  Has at least one of the high risk factor(s) for progression to severe COVID-19 and/or hospitalization as defined in EUA.  Specific high risk criteria : BMI >/= 35   I have spoken and communicated the following to the patient or parent/caregiver:  1. FDA has authorized the emergency use of bamlanivimab and casirivimab\imdevimab for the treatment of mild to moderate COVID-19 in adults and pediatric patients with positive results of direct SARS-CoV-2 viral testing who are 80 years of age and older weighing at least 40 kg, and who are at high risk for progressing to severe COVID-19 and/or hospitalization.  2. The significant known and potential risks and benefits of bamlanivimab and casirivimab\imdevimab, and the extent to which such potential risks and benefits are unknown.  3. Information on available alternative treatments and the risks and benefits of those alternatives, including clinical trials.  4. Patients treated with bamlanivimab and casirivimab\imdevimab should continue to self-isolate and use infection control measures (e.g., wear mask, isolate, social distance, avoid sharing personal items, clean and disinfect "high touch" surfaces, and frequent handwashing) according to CDC guidelines.   5. The patient or parent/caregiver has the option to accept or refuse  bamlanivimab or casirivimab\imdevimab .  After reviewing this information with the patient, The patient agreed to proceed with receiving the bamlanimivab infusion and will be provided a copy of the Fact sheet prior to receiving the infusion.Debbie Sosa 01/12/2019 10:25 AM

## 2019-01-13 ENCOUNTER — Encounter: Payer: Self-pay | Admitting: Emergency Medicine

## 2019-01-13 ENCOUNTER — Emergency Department: Payer: BC Managed Care – PPO

## 2019-01-13 ENCOUNTER — Other Ambulatory Visit: Payer: Self-pay

## 2019-01-13 ENCOUNTER — Inpatient Hospital Stay
Admission: EM | Admit: 2019-01-13 | Discharge: 2019-01-18 | DRG: 177 | Disposition: A | Discharge status: Home / Self Care | Payer: BC Managed Care – PPO | Attending: Internal Medicine | Admitting: Internal Medicine

## 2019-01-13 DIAGNOSIS — J1289 Other viral pneumonia: Secondary | ICD-10-CM

## 2019-01-13 DIAGNOSIS — R531 Weakness: Secondary | ICD-10-CM

## 2019-01-13 DIAGNOSIS — Z91041 Radiographic dye allergy status: Secondary | ICD-10-CM

## 2019-01-13 DIAGNOSIS — Z886 Allergy status to analgesic agent status: Secondary | ICD-10-CM

## 2019-01-13 DIAGNOSIS — Z885 Allergy status to narcotic agent status: Secondary | ICD-10-CM

## 2019-01-13 DIAGNOSIS — R109 Unspecified abdominal pain: Secondary | ICD-10-CM

## 2019-01-13 DIAGNOSIS — Z79899 Other long term (current) drug therapy: Secondary | ICD-10-CM

## 2019-01-13 DIAGNOSIS — U071 COVID-19: Principal | ICD-10-CM | POA: Diagnosis present

## 2019-01-13 DIAGNOSIS — Z91040 Latex allergy status: Secondary | ICD-10-CM

## 2019-01-13 DIAGNOSIS — Z803 Family history of malignant neoplasm of breast: Secondary | ICD-10-CM

## 2019-01-13 DIAGNOSIS — R0902 Hypoxemia: Secondary | ICD-10-CM | POA: Diagnosis not present

## 2019-01-13 DIAGNOSIS — G4731 Primary central sleep apnea: Secondary | ICD-10-CM | POA: Diagnosis present

## 2019-01-13 DIAGNOSIS — Z9884 Bariatric surgery status: Secondary | ICD-10-CM

## 2019-01-13 DIAGNOSIS — Z9049 Acquired absence of other specified parts of digestive tract: Secondary | ICD-10-CM

## 2019-01-13 DIAGNOSIS — Z887 Allergy status to serum and vaccine status: Secondary | ICD-10-CM

## 2019-01-13 DIAGNOSIS — Z882 Allergy status to sulfonamides status: Secondary | ICD-10-CM

## 2019-01-13 DIAGNOSIS — Z888 Allergy status to other drugs, medicaments and biological substances status: Secondary | ICD-10-CM

## 2019-01-13 DIAGNOSIS — K219 Gastro-esophageal reflux disease without esophagitis: Secondary | ICD-10-CM | POA: Diagnosis present

## 2019-01-13 DIAGNOSIS — Z6833 Body mass index (BMI) 33.0-33.9, adult: Secondary | ICD-10-CM

## 2019-01-13 DIAGNOSIS — M79604 Pain in right leg: Secondary | ICD-10-CM | POA: Diagnosis not present

## 2019-01-13 DIAGNOSIS — Z86711 Personal history of pulmonary embolism: Secondary | ICD-10-CM

## 2019-01-13 DIAGNOSIS — R0602 Shortness of breath: Secondary | ICD-10-CM | POA: Diagnosis not present

## 2019-01-13 DIAGNOSIS — Z91012 Allergy to eggs: Secondary | ICD-10-CM

## 2019-01-13 DIAGNOSIS — E669 Obesity, unspecified: Secondary | ICD-10-CM | POA: Diagnosis present

## 2019-01-13 DIAGNOSIS — Z23 Encounter for immunization: Secondary | ICD-10-CM

## 2019-01-13 DIAGNOSIS — Z975 Presence of (intrauterine) contraceptive device: Secondary | ICD-10-CM

## 2019-01-13 DIAGNOSIS — M79605 Pain in left leg: Secondary | ICD-10-CM

## 2019-01-13 DIAGNOSIS — Z881 Allergy status to other antibiotic agents status: Secondary | ICD-10-CM

## 2019-01-13 DIAGNOSIS — J1282 Pneumonia due to coronavirus disease 2019: Secondary | ICD-10-CM | POA: Diagnosis present

## 2019-01-13 DIAGNOSIS — K5909 Other constipation: Secondary | ICD-10-CM | POA: Diagnosis present

## 2019-01-13 LAB — COMPREHENSIVE METABOLIC PANEL
ALT: 19 U/L (ref 0–44)
AST: 28 U/L (ref 15–41)
Albumin: 3.7 g/dL (ref 3.5–5.0)
Alkaline Phosphatase: 83 U/L (ref 38–126)
Anion gap: 11 (ref 5–15)
BUN: 11 mg/dL (ref 6–20)
CO2: 20 mmol/L — ABNORMAL LOW (ref 22–32)
Calcium: 8.4 mg/dL — ABNORMAL LOW (ref 8.9–10.3)
Chloride: 105 mmol/L (ref 98–111)
Creatinine, Ser: 0.73 mg/dL (ref 0.44–1.00)
GFR calc Af Amer: >60 mL/min (ref >60)
GFR calc non Af Amer: >60 mL/min (ref >60)
Glucose, Bld: 98 mg/dL (ref 70–99)
Potassium: 3.7 mmol/L (ref 3.5–5.1)
Sodium: 136 mmol/L (ref 135–145)
Total Bilirubin: 0.4 mg/dL (ref 0.3–1.2)
Total Protein: 7.5 g/dL (ref 6.5–8.1)

## 2019-01-13 LAB — CBC WITH DIFFERENTIAL/PLATELET
Abs Immature Granulocytes: 0.01 10*3/uL (ref 0.00–0.07)
Basophils Absolute: 0 10*3/uL (ref 0.0–0.1)
Basophils Relative: 0 %
Eosinophils Absolute: 0 10*3/uL (ref 0.0–0.5)
Eosinophils Relative: 0 %
HCT: 38.3 % (ref 36.0–46.0)
Hemoglobin: 12.8 g/dL (ref 12.0–15.0)
Immature Granulocytes: 0 %
Lymphocytes Relative: 28 %
Lymphs Abs: 1.1 10*3/uL (ref 0.7–4.0)
MCH: 29.4 pg (ref 26.0–34.0)
MCHC: 33.4 g/dL (ref 30.0–36.0)
MCV: 87.8 fL (ref 80.0–100.0)
Monocytes Absolute: 0.4 10*3/uL (ref 0.1–1.0)
Monocytes Relative: 9 %
Neutro Abs: 2.5 10*3/uL (ref 1.7–7.7)
Neutrophils Relative %: 63 %
Platelets: 263 10*3/uL (ref 150–400)
RBC: 4.36 MIL/uL (ref 3.87–5.11)
RDW: 14.4 % (ref 11.5–15.5)
WBC: 3.9 10*3/uL — ABNORMAL LOW (ref 4.0–10.5)
nRBC: 0 % (ref 0.0–0.2)

## 2019-01-13 LAB — FERRITIN: Ferritin: 14 ng/mL (ref 11–307)

## 2019-01-13 LAB — FIBRIN DERIVATIVES D-DIMER (ARMC ONLY): Fibrin derivatives D-dimer (ARMC): 914.16 ng/mL (FEU) — ABNORMAL HIGH (ref 0.00–499.00)

## 2019-01-13 LAB — LACTATE DEHYDROGENASE: LDH: 169 U/L (ref 98–192)

## 2019-01-13 MED ORDER — ALBUTEROL SULFATE HFA 108 (90 BASE) MCG/ACT IN AERS
2.0000 | INHALATION_SPRAY | Freq: Four times a day (QID) | RESPIRATORY_TRACT | Status: DC
  Administered 2019-01-14 – 2019-01-18 (×18): 2 via RESPIRATORY_TRACT
  Filled 2019-01-13: qty 6.7

## 2019-01-13 MED ORDER — SODIUM CHLORIDE 0.9 % IV SOLN
200.0000 mg | Freq: Once | INTRAVENOUS | Status: AC
  Administered 2019-01-13: 200 mg via INTRAVENOUS
  Filled 2019-01-13: qty 200

## 2019-01-13 MED ORDER — ADULT MULTIVITAMIN W/MINERALS CH
1.0000 | ORAL_TABLET | Freq: Every day | ORAL | Status: DC
  Administered 2019-01-14 – 2019-01-18 (×5): 1 via ORAL
  Filled 2019-01-13 (×6): qty 1

## 2019-01-13 MED ORDER — ZINC SULFATE 220 (50 ZN) MG PO CAPS
220.0000 mg | ORAL_CAPSULE | Freq: Every day | ORAL | Status: DC
  Administered 2019-01-14 – 2019-01-18 (×5): 220 mg via ORAL
  Filled 2019-01-13 (×5): qty 1

## 2019-01-13 MED ORDER — GUAIFENESIN-DM 100-10 MG/5ML PO SYRP
10.0000 mL | ORAL_SOLUTION | ORAL | Status: DC | PRN
  Filled 2019-01-13: qty 10

## 2019-01-13 MED ORDER — DEXAMETHASONE SODIUM PHOSPHATE 10 MG/ML IJ SOLN
6.0000 mg | Freq: Every day | INTRAMUSCULAR | Status: DC
  Administered 2019-01-14 – 2019-01-17 (×4): 6 mg via INTRAVENOUS
  Filled 2019-01-13 (×5): qty 0.6

## 2019-01-13 MED ORDER — DEXAMETHASONE SODIUM PHOSPHATE 10 MG/ML IJ SOLN
8.0000 mg | Freq: Once | INTRAMUSCULAR | Status: AC
  Administered 2019-01-13: 8 mg via INTRAVENOUS
  Filled 2019-01-13: qty 1

## 2019-01-13 MED ORDER — ENOXAPARIN SODIUM 40 MG/0.4ML ~~LOC~~ SOLN
40.0000 mg | SUBCUTANEOUS | Status: DC
  Administered 2019-01-14 – 2019-01-18 (×5): 40 mg via SUBCUTANEOUS
  Filled 2019-01-13 (×6): qty 0.4

## 2019-01-13 MED ORDER — SODIUM CHLORIDE 0.9 % IV SOLN
100.0000 mg | Freq: Every day | INTRAVENOUS | Status: AC
  Administered 2019-01-14 – 2019-01-17 (×4): 100 mg via INTRAVENOUS
  Filled 2019-01-13: qty 20
  Filled 2019-01-13: qty 100
  Filled 2019-01-13: qty 20
  Filled 2019-01-13 (×2): qty 100

## 2019-01-13 MED ORDER — ASCORBIC ACID 500 MG PO TABS
500.0000 mg | ORAL_TABLET | Freq: Every day | ORAL | Status: DC
  Administered 2019-01-14 – 2019-01-18 (×5): 500 mg via ORAL
  Filled 2019-01-13 (×5): qty 1

## 2019-01-13 NOTE — Progress Notes (Signed)
Remdesivir - Pharmacy Brief Note   O:  ALT: 19 CXR: evidence of infection SpO2: 98% on    A/P:  Remdesivir 200 mg IVPB once followed by 100 mg IVPB daily x 4 days.   Hart Robinsons, PharmD 01/13/2019 11:15 PM

## 2019-01-13 NOTE — ED Triage Notes (Signed)
Pt presents from home via [facility] with c/o weakness, neck pain and bilateral leg pain that have been going on since 3 days ago. Pt recently diagnosed with covid-19 on dec.24th. Pt has hx of pulmonary embolus.

## 2019-01-13 NOTE — H&P (Signed)
History and Physical    Debbie Sosa [id number] DOB: 12/04/1975 DOA: 01/13/2019  PCP: Elgie Collard, MD  Patient coming from: home  I have personally briefly reviewed patient's old medical records in [place]  Chief Complaint: shortness of breath and bilateral leg pain  HPI: Debbie Sosa is a 43 y.o. female with medical history significant for with history of obesity status post gastric bypass surgery as well as history of PE related to pregnancy, diagnosed with Covid on 01/10/2019 who presents to the emergency room with weakness, shortness of breath and muscle soreness legs and upper extremities.  Denies nausea vomiting or diarrhea denies fever   ED Course: On arrival in the emergency room she was afebrile with a temperature of 98.7.  Respirations was 22 and O2 sat was 99% on room air but she desatted to the mid 80s with ambulation.  Blood pressure 103/77 and heart rate 86.  O2 sats returned to the high 90s on O2 at 1 to 2 L.  Did have increased inflammatory markers with an LDH of 169.  Ferritin was 14.  Was mildly leukopenic at 3.8.  Otherwise blood work unremarkable.  Chest x-ray showed patchy opacity left lung field suspicious for COVID-19/streaky atelectasis bilateral bases.  Hospitalist consulted for admission.  Review of Systems: As per HPI otherwise 10 point review of systems negative.    Past Medical History:  Diagnosis Date  . Asthma   . Pulmonary embolism [facility]     Past Surgical History:  Procedure Laterality Date  . BREAST BIOPSY Left 2006   benign/clip  . BREAST BIOPSY Right 2015   benign/clip  . BREAST REDUCTION SURGERY    . CESAREAN SECTION    . CESAREAN SECTION    . CHOLECYSTECTOMY    . COLONOSCOPY    . COLONOSCOPY WITH PROPOFOL N/A 10/31/2018   Procedure: COLONOSCOPY WITH PROPOFOL;  Surgeon: [name], MD;  Location: [facility] ENDOSCOPY;  Service: Gastroenterology;  Laterality: N/A;  . ESOPHAGOGASTRODUODENOSCOPY (EGD) WITH PROPOFOL N/A  10/04/2016   Procedure: ESOPHAGOGASTRODUODENOSCOPY (EGD) WITH PROPOFOL;  Surgeon: [name], MD;  Location: [facility] ENDOSCOPY;  Service: Gastroenterology;  Laterality: N/A;  . ESOPHAGOGASTRODUODENOSCOPY (EGD) WITH PROPOFOL N/A 04/14/2017   Procedure: ESOPHAGOGASTRODUODENOSCOPY (EGD) WITH PROPOFOL;  Surgeon: [name], MD;  Location: [facility] ENDOSCOPY;  Service: Gastroenterology;  Laterality: N/A;  . ESOPHAGOGASTRODUODENOSCOPY (EGD) WITH PROPOFOL N/A 10/31/2018   Procedure: ESOPHAGOGASTRODUODENOSCOPY (EGD) WITH PROPOFOL;  Surgeon: [name], MD;  Location: [facility] ENDOSCOPY;  Service: Gastroenterology;  Laterality: N/A;  . GASTRIC BYPASS     01/2015  . KNEE SURGERY    . NASAL SINUS SURGERY    . PLANTAR FASCIA SURGERY     x 3  . REDUCTION MAMMAPLASTY Bilateral 2005     reports that she has never smoked. She has never used smokeless tobacco. She reports that she does not drink alcohol or use drugs.  Allergies  Allergen Reactions  . Cephalexin Hives and Rash  . Fexofenadine Shortness Of Breath  . Ketorolac Hives  . Morphine Anaphylaxis  . Phenazopyridine Rash  . Promethazine Hives  . Cetirizine Other (See Comments)    Skin eruptures  . Contrast Media [Iodinated Diagnostic Agents] Hives  . Eggs Or Egg-Derived Products     Other reaction(s): RASH Other reaction(s): RASH  . Latex   . Morphine And Related   . Other Hives    Pain medicine that starts with an L  . Pyridium [Phenazopyridine Hcl]   . Tramadol   . Vioxx [  Rofecoxib]   . Hepatitis B Virus Vaccines Rash  . Naproxen Rash  . Oxycodone-Acetaminophen Itching  . Sulfa Antibiotics Rash    Family History  Problem Relation Age of Onset  . Breast cancer Mother 40  . Breast cancer Cousin 30  . Breast cancer Cousin 42     Prior to Admission medications   Medication Sig Start Date End Date Taking? Authorizing Provider  albuterol (VENTOLIN HFA) 108 (90 Base) MCG/ACT inhaler Inhale 1-2 puffs INH Q4-6hr prn for chest tightness,  cough, wheezing, SOB/DOE. 01/10/19   Jaynee Eagles, PA-C  benzonatate (TESSALON) 100 MG capsule Take 1-2 capsules (100-200 mg total) by mouth 3 (three) times daily as needed. 01/10/19   Jaynee Eagles, PA-C  Diclofenac Sodium 3 % GEL Place onto the skin. 12/01/15   [provider]  EPINEPHrine (EPIPEN 2-PAK) 0.3 mg/0.3 mL IJ SOAJ injection EpiPen 2-Pak 0.3 mg/0.3 mL injection, auto-injector    [provider]  fluticasone (FLONASE) 50 MCG/ACT nasal spray once as needed.  05/08/14   [provider]  HYDROcodone-acetaminophen (NORCO/VICODIN) 5-325 MG tablet Take 1 tablet by mouth every 4 (four) hours as needed for up to 6 doses for moderate pain. Patient not taking: Reported on 10/16/2018 08/12/18   Gregor Hams, MD  hyoscyamine (LEVSIN) 0.125 MG tablet Take 1 tablet (0.125 mg total) by mouth 3 (three) times daily. 12/27/18   [name], MD  montelukast (SINGULAIR) 10 MG tablet Take by mouth. 08/20/15   [provider]  Multiple Vitamin (MULTI-VITAMINS) TABS Take by mouth.    [provider]  mupirocin ointment (BACTROBAN) 2 % Apply 1 application topically 3 (three) times daily. Patient not taking: Reported on 10/16/2018 12/31/17   Lorin Picket, PA-C  omeprazole (PRILOSEC) 40 MG capsule Take 1 capsule (40 mg total) by mouth daily. 10/16/18 04/14/19  [name], MD  predniSONE (DELTASONE) 50 MG tablet Take 1 tab at 13 hours, 7 hours, and 1 hour prior to exam 11/28/18   [name], MD  sucralfate (CARAFATE) 1 g tablet Take by mouth. 10/14/15   [provider]  tobramycin (TOBREX) 0.3 % ophthalmic solution instill 1-2 drops into affected eye twice a day 07/07/16   [provider]  triamcinolone cream (KENALOG) 0.1 % Apply 1 application topically 2 (two) times daily. 12/31/17   Lorin Picket, PA-C    Physical Exam: Vitals:   01/13/19 2100 01/13/19 2107  BP: 105/72   Pulse: 92   Resp: 10   Temp:  98.7 F (37.1 C)  TempSrc:  Oral    SpO2: 100% 100%  Weight:  90.7 kg  Height:  5\' 5"  (1.651 m)     Vitals:   01/13/19 2100 01/13/19 2107  BP: 105/72   Pulse: 92   Resp: 10   Temp:  98.7 F (37.1 C)  TempSrc:  Oral  SpO2: 100% 100%  Weight:  90.7 kg  Height:  5\' 5"  (1.651 m)    Constitutional: NAD, alert and oriented x 3 Eyes: PERRL, lids and conjunctivae normal ENMT: Mucous membranes are moist.  Neck: normal, supple, no masses, no thyromegaly Respiratory: clear to auscultation bilaterally, no wheezing, no crackles. Normal respiratory effort. No accessory muscle use.  Cardiovascular: Regular rate and rhythm, no murmurs / rubs / gallops. No extremity edema. 2+ pedal pulses. No carotid bruits.  Abdomen: no tenderness, no masses palpated. No hepatosplenomegaly. Bowel sounds positive.  Musculoskeletal: no clubbing / cyanosis. No joint deformity upper and lower extremities.  Sore to  the touch on palpation of bilateral legs Skin: no rashes, lesions, ulcers.  Neurologic: No gross focal neurologic deficit. Psychiatric: Normal mood and affect.   Labs on Admission: I have personally reviewed following labs and imaging studies  CBC: Recent Labs  Lab 01/13/19 2056  WBC 3.9*  NEUTROABS 2.5  HGB 12.8  HCT 38.3  MCV 87.8  PLT 99991111   Basic Metabolic Panel: Recent Labs  Lab 01/13/19 2056  NA 136  K 3.7  CL 105  CO2 20*  GLUCOSE 98  BUN 11  CREATININE 0.73  CALCIUM 8.4*   GFR: Estimated Creatinine Clearance: 100.9 mL/min (by C-G formula based on SCr of 0.73 mg/dL). Liver Function Tests: Recent Labs  Lab 01/13/19 2056  AST 28  ALT 19  ALKPHOS 83  BILITOT 0.4  PROT 7.5  ALBUMIN 3.7   No results for input(s): LIPASE, AMYLASE in the last 168 hours. No results for input(s): AMMONIA in the last 168 hours. Coagulation Profile: No results for input(s): INR, PROTIME in the last 168 hours. Cardiac Enzymes: No results for input(s): CKTOTAL, CKMB, CKMBINDEX, TROPONINI in the last 168 hours. BNP (last 3  results) No results for input(s): PROBNP in the last 8760 hours. HbA1C: No results for input(s): HGBA1C in the last 72 hours. CBG: No results for input(s): GLUCAP in the last 168 hours. Lipid Profile: No results for input(s): CHOL, HDL, LDLCALC, TRIG, CHOLHDL, LDLDIRECT in the last 72 hours. Thyroid Function Tests: No results for input(s): TSH, T4TOTAL, FREET4, T3FREE, THYROIDAB in the last 72 hours. Anemia Panel: Recent Labs    01/13/19 2056  FERRITIN 14   Urine analysis:    Component Value Date/Time   COLORURINE YELLOW (A) 10/02/2016 1710   APPEARANCEUR HAZY (A) 10/02/2016 1710   APPEARANCEUR Turbid 08/23/2013 1720   LABSPEC 1.018 10/02/2016 1710   LABSPEC 1.008 08/23/2013 1720   PHURINE 6.0 10/02/2016 1710   GLUCOSEU 50 (A) 10/02/2016 1710   GLUCOSEU Negative 08/23/2013 1720   HGBUR SMALL (A) 10/02/2016 1710   BILIRUBINUR NEGATIVE 10/02/2016 1710   BILIRUBINUR Negative 08/23/2013 1720   KETONESUR 5 (A) 10/02/2016 1710   PROTEINUR NEGATIVE 10/02/2016 1710   NITRITE NEGATIVE 10/02/2016 1710   LEUKOCYTESUR NEGATIVE 10/02/2016 1710   LEUKOCYTESUR 3+ 08/23/2013 1720    Radiological Exams on Admission: US Venous Img Lower Bilateral (DVT)  Result Date: 01/13/2019 CLINICAL DATA:  Shortness of breath and bilateral leg pain EXAM: BILATERAL LOWER EXTREMITY VENOUS DOPPLER ULTRASOUND TECHNIQUE: Gray-scale sonography with graded compression, as well as color Doppler and duplex ultrasound were performed to evaluate the lower extremity deep venous systems from the level of the common femoral vein and including the common femoral, femoral, profunda femoral, popliteal and calf veins including the posterior tibial, peroneal and gastrocnemius veins when visible. The superficial great saphenous vein was also interrogated. Spectral Doppler was utilized to evaluate flow at rest and with distal augmentation maneuvers in the common femoral, femoral and popliteal veins. COMPARISON:  None. FINDINGS:  RIGHT LOWER EXTREMITY Common Femoral Vein: No evidence of thrombus. Normal compressibility, respiratory phasicity and response to augmentation. Saphenofemoral Junction: No evidence of thrombus. Normal compressibility and flow on color Doppler imaging. Profunda Femoral Vein: No evidence of thrombus. Normal compressibility and flow on color Doppler imaging. Femoral Vein: No evidence of thrombus. Normal compressibility, respiratory phasicity and response to augmentation. Popliteal Vein: No evidence of thrombus. Normal compressibility, respiratory phasicity and response to augmentation. Calf Veins: No evidence of thrombus. Normal compressibility and flow on color Doppler imaging.  Superficial Great Saphenous Vein: No evidence of thrombus. Normal compressibility. Venous Reflux:  None. Other Findings:  None. LEFT LOWER EXTREMITY Common Femoral Vein: No evidence of thrombus. Normal compressibility, respiratory phasicity and response to augmentation. Saphenofemoral Junction: No evidence of thrombus. Normal compressibility and flow on color Doppler imaging. Profunda Femoral Vein: No evidence of thrombus. Normal compressibility and flow on color Doppler imaging. Femoral Vein: No evidence of thrombus. Normal compressibility, respiratory phasicity and response to augmentation. Popliteal Vein: No evidence of thrombus. Normal compressibility, respiratory phasicity and response to augmentation. Calf Veins: No evidence of thrombus. Normal compressibility and flow on color Doppler imaging. Superficial Great Saphenous Vein: No evidence of thrombus. Normal compressibility. Venous Reflux:  None. Other Findings:  None. IMPRESSION: No evidence of deep venous thrombosis in either lower extremity. Electronically Signed   By: Prudencio Pair M.D.   On: 01/13/2019 23:02   DG Chest Port 1 View  Result Date: 01/13/2019 CLINICAL DATA:  Shortness of breath, COVID-19 diagnose 01/10/2019 EXAM: PORTABLE CHEST 1 VIEW COMPARISON:  Radiograph  01/31/2018, chest CTA 10/26/2017 FINDINGS: Patchy opacity in the left lung periphery. More streaky atelectatic opacities are present in the lung bases. No pneumothorax or visible effusion. Cardiomediastinal contours are unremarkable for portable technique. No acute osseous or soft tissue abnormality. IMPRESSION: 1. Patchy opacity in the left lung periphery suspicious for pneumonia given COVID-19 positivity. 2. More streaky atelectatic opacities in the lung bases. Electronically Signed   By: Lovena Le M.D.   On: 01/13/2019 21:46     Assessment/Plan Active Problems:    Pneumonia due to COVID-19 virus with hypoxia --IV dexamethasone, remdesivir per orders as well as multivitamins --Oxygen to keep sats over 90% --Follow inflammatory biomarkers    Bilateral leg pain/myalgia --Negative bilateral lower extremity venous Dopplers --CPK level ordered --Etiology uncertain but patient does have a history of PE but no evidence of DVT at this time      DVT prophylaxis: lovenoxCode Status: full  Family Communication: none  Disposition Plan: Back to previous home environment Consults called: none     Athena Masse MD [facility]     01/13/2019, 11:30 PM

## 2019-01-13 NOTE — ED Provider Notes (Signed)
[facility] Emergency Department Provider Note  ____________________________________________   First MD Initiated Contact with Patient 01/13/19 2102     (approximate)  I have reviewed the triage vital signs and the nursing notes.  History  Chief Complaint Leg Pain and Weakness    HPI Debbie Sosa is a 43 y.o. female with a history of asthma, obesity, PE after delivery of her child, no longer on anticoagulation, who presents emergency department for shortness of breath, fatigue, joint pains, bilateral leg pain in the setting of COVID positive, swabbed on 12/24.  Patient reports over the last 3 days, she has had increased generalized weakness, body aches, fatigue, shortness of breath. Symptoms are constant and have been progressively worsening over the last 3 days. She is particularly short of breath with exertion.  She reports bilateral leg pain, left > right. Described as aching and almost hyperaesthetic. Constant, worsened with touch/palaption. No radiation. No alleviating factors.    Past Medical Hx Past Medical History:  Diagnosis Date  . Asthma   . Pulmonary embolism [facility])     Problem List Patient Active Problem List   Diagnosis Date Noted  . Acute laryngitis 08/17/2016  . Dysphagia 08/17/2016  . Meningitis 08/17/2016  . 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 Diarrhea, unspecified 10/14/2015  . Left lower quadrant pain 10/14/2015  . Nausea and vomiting 10/14/2015  . 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  . 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 Achalasia 01/07/2015  . Central sleep apnea 01/07/2015  . Obesity (BMI 30-39.9) 01/07/2015  . GERD (gastroesophageal reflux disease) 12/25/2014  . Pulmonary embolism ([place])   . Plantar fasciitis of right foot 12/31/2013    Past Surgical Hx Past Surgical History:  Procedure Laterality Date  . BREAST BIOPSY Left 2006   benign/clip  . BREAST BIOPSY Right 2015   benign/clip  . BREAST REDUCTION SURGERY      . CESAREAN SECTION    . CESAREAN SECTION    . CHOLECYSTECTOMY    . COLONOSCOPY    . COLONOSCOPY WITH PROPOFOL N/A 10/31/2018   Procedure: COLONOSCOPY WITH PROPOFOL;  Surgeon: [name], MD;  Location: [facility] ENDOSCOPY;  Service: Gastroenterology;  Laterality: N/A;  . ESOPHAGOGASTRODUODENOSCOPY (EGD) WITH PROPOFOL N/A 10/04/2016   Procedure: ESOPHAGOGASTRODUODENOSCOPY (EGD) WITH PROPOFOL;  Surgeon: [name], MD;  Location: [facility] ENDOSCOPY;  Service: Gastroenterology;  Laterality: N/A;  . ESOPHAGOGASTRODUODENOSCOPY (EGD) WITH PROPOFOL N/A 04/14/2017   Procedure: ESOPHAGOGASTRODUODENOSCOPY (EGD) WITH PROPOFOL;  Surgeon: [name], MD;  Location[facility] ENDOSCOPY;  Service: Gastroenterology;  Laterality: N/A;  . ESOPHAGOGASTRODUODENOSCOPY (EGD) WITH PROPOFOL N/A 10/31/2018   Procedure: ESOPHAGOGASTRODUODENOSCOPY (EGD) WITH PROPOFOL;  Surgeon: [name], MD;  Location: [facility] ENDOSCOPY;  Service: Gastroenterology;  Laterality: N/A;  . GASTRIC BYPASS     01/2015  . KNEE SURGERY    . NASAL SINUS SURGERY    . PLANTAR FASCIA SURGERY     x 3  . REDUCTION MAMMAPLASTY Bilateral 2005    Medications Prior to Admission medications   Medication Sig Start Date End Date Taking? Authorizing Provider  albuterol (VENTOLIN HFA) 108 (90 Base) MCG/ACT inhaler Inhale 1-2 puffs INH Q4-6hr prn for chest tightness, cough, wheezing, SOB/DOE. 01/10/19   Jaynee Eagles, PA-C  benzonatate (TESSALON) 100 MG capsule Take 1-2 capsules (100-200 mg total) by mouth 3 (three) times daily as needed. 01/10/19   Jaynee Eagles, PA-C  Diclofenac Sodium 3 % GEL Place onto the skin. 12/01/15   [provider]  EPINEPHrine (EPIPEN 2-PAK) 0.3 mg/0.3 mL IJ SOAJ injection  EpiPen 2-Pak 0.3 mg/0.3 mL injection, auto-injector    [provider]  fluticasone (FLONASE) 50 MCG/ACT nasal spray once as needed.  05/08/14   [provider]  HYDROcodone-acetaminophen (NORCO/VICODIN) 5-325 MG tablet Take 1 tablet by mouth  every 4 (four) hours as needed for up to 6 doses for moderate pain. Patient not taking: Reported on 10/16/2018 08/12/18   Gregor Hams, MD  hyoscyamine (LEVSIN) 0.125 MG tablet Take 1 tablet (0.125 mg total) by mouth 3 (three) times daily. 12/27/18   [name], MD  montelukast (SINGULAIR) 10 MG tablet Take by mouth. 08/20/15   [provider]  Multiple Vitamin (MULTI-VITAMINS) TABS Take by mouth.    [provider]  mupirocin ointment (BACTROBAN) 2 % Apply 1 application topically 3 (three) times daily. Patient not taking: Reported on 10/16/2018 12/31/17   Lorin Picket, PA-C  omeprazole (PRILOSEC) 40 MG capsule Take 1 capsule (40 mg total) by mouth daily. 10/16/18 04/14/19  [name], MD  predniSONE (DELTASONE) 50 MG tablet Take 1 tab at 13 hours, 7 hours, and 1 hour prior to exam 11/28/18   [name], MD  sucralfate (CARAFATE) 1 g tablet Take by mouth. 10/14/15   [provider]  tobramycin (TOBREX) 0.3 % ophthalmic solution instill 1-2 drops into affected eye twice a day 07/07/16   [provider]  triamcinolone cream (KENALOG) 0.1 % Apply 1 application topically 2 (two) times daily. 12/31/17   Lorin Picket, PA-C    Allergies Cephalexin, Fexofenadine, Ketorolac, Morphine, Phenazopyridine, Promethazine, Cetirizine, Contrast media [iodinated diagnostic agents], Eggs or egg-derived products, Latex, Morphine and related, Other, Pyridium [phenazopyridine hcl], Tramadol, Vioxx [rofecoxib], Hepatitis b virus vaccines, Naproxen, Oxycodone-acetaminophen, and Sulfa antibiotics  Family Hx Family History  Problem Relation Age of Onset  . Breast cancer Mother 35  . Breast cancer Cousin 30  . Breast cancer Cousin 51    Social Hx Social History   Tobacco Use  . Smoking status: Never Smoker  . Smokeless tobacco: Never Used  Substance Use Topics  . Alcohol use: No  . Drug use: No     Review of Systems  Constitutional: Negative for fever, chills.  +fatigue, body aches Eyes: Negative for visual changes. ENT: Negative for sore throat. Cardiovascular: Negative for chest pain. Respiratory: + for shortness of breath. Gastrointestinal: Negative for nausea, vomiting.  Genitourinary: Negative for dysuria. Musculoskeletal: + bilateral leg pain Skin: Negative for rash. Neurological: Negative for for headaches.   Physical Exam  Vital Signs: ED Triage Vitals  Enc Vitals Group     BP 01/13/19 2100 105/72     Pulse Rate 01/13/19 2100 92     Resp 01/13/19 2100 10     Temp 01/13/19 2107 98.7 F (37.1 C)     Temp Source 01/13/19 2107 Oral     SpO2 01/13/19 2100 100 %     Weight 01/13/19 2107 200 lb (90.7 kg)     Height 01/13/19 2107 5\' 5"  (1.651 m)     Head Circumference --      Peak Flow --      Pain Score 01/13/19 2107 10     Pain Loc --      Pain Edu? --      Excl. in [place]? --     Constitutional: Alert and oriented. Appears fatigued.  Head: Normocephalic. Atraumatic. Eyes: Conjunctivae clear. Sclera anicteric. Nose: No congestion. No rhinorrhea. Mouth/Throat: Wearing mask.  Neck: No stridor.   Cardiovascular: Normal rate, regular rhythm. Extremities  well perfused. Respiratory: Normal respiratory effort, but appears SOB with long sentences. Desaturates to mid 28s with ambulation. Placed on 1-2 L [place]. Gastrointestinal: Soft. Non-tender. Non-distended.  Musculoskeletal: Trace, symmetric swelling to BLE. BLE TTP. No erythema, warmth, induration.  Neurologic:  Normal speech and language. No gross focal neurologic deficits are appreciated.  Skin: Skin is warm, dry and intact. No rash noted. Psychiatric: Mood and affect are appropriate for situation.  EKG  Personally reviewed.   Rate: 98 Rhythm: sinus Axis: normal Intervals: WNL No acute ischemic changes No STEMI    Radiology  CXR: IMPRESSION:  1. Patchy opacity in the left lung periphery suspicious for  pneumonia given COVID-19 positivity.  2. More streaky  atelectatic opacities in the lung bases.   [place] BLE: IMPRESSION:  No evidence of deep venous thrombosis in either lower extremity.    Procedures  Procedure(s) performed (including critical care):  Procedures   Initial Impression / Assessment and Plan / ED Course  43 y.o. female who presents to the ED for worsening SOB/DOE, body aches, fatigue, in setting of known COVID positive   Ddx: progression of COVID, superimposed infection, DVT, electrolyte abnormality  Work up negative for BLE DVT. CXR c/w COVID pneumonia. Patient ambulated in room with drop in oxygen to mid 80s, placed on 2 L and returned to bed with improvement in her oxygen to 100%. Given her drop in oxygen with positive XR findings + medical co-morbidities, will initiate steroids, c/s pharmacy re: remdesivir and plan to admit.    Final Clinical Impression(s) / ED Diagnosis  Final diagnoses:  Shortness of breath  COVID-19       Note:  This document was prepared using Dragon voice recognition software and may include unintentional dictation errors.   Lilia Pro., MD 01/14/19 [phone]

## 2019-01-13 NOTE — ED Notes (Addendum)
Pt mother updated on pt status with pt consent

## 2019-01-13 NOTE — ED Notes (Signed)
Ultrasound at bedside

## 2019-01-14 ENCOUNTER — Other Ambulatory Visit: Payer: BC Managed Care – PPO

## 2019-01-14 DIAGNOSIS — K5909 Other constipation: Secondary | ICD-10-CM | POA: Diagnosis present

## 2019-01-14 DIAGNOSIS — E669 Obesity, unspecified: Secondary | ICD-10-CM | POA: Diagnosis present

## 2019-01-14 DIAGNOSIS — Z91040 Latex allergy status: Secondary | ICD-10-CM | POA: Diagnosis not present

## 2019-01-14 DIAGNOSIS — Z23 Encounter for immunization: Secondary | ICD-10-CM | POA: Diagnosis not present

## 2019-01-14 DIAGNOSIS — Z885 Allergy status to narcotic agent status: Secondary | ICD-10-CM | POA: Diagnosis not present

## 2019-01-14 DIAGNOSIS — J1282 Pneumonia due to coronavirus disease 2019: Secondary | ICD-10-CM | POA: Diagnosis present

## 2019-01-14 DIAGNOSIS — Z79899 Other long term (current) drug therapy: Secondary | ICD-10-CM | POA: Diagnosis not present

## 2019-01-14 DIAGNOSIS — R0602 Shortness of breath: Secondary | ICD-10-CM | POA: Diagnosis present

## 2019-01-14 DIAGNOSIS — R0902 Hypoxemia: Secondary | ICD-10-CM | POA: Diagnosis not present

## 2019-01-14 DIAGNOSIS — U071 COVID-19: Secondary | ICD-10-CM | POA: Diagnosis not present

## 2019-01-14 DIAGNOSIS — Z887 Allergy status to serum and vaccine status: Secondary | ICD-10-CM | POA: Diagnosis not present

## 2019-01-14 DIAGNOSIS — Z9884 Bariatric surgery status: Secondary | ICD-10-CM | POA: Diagnosis not present

## 2019-01-14 DIAGNOSIS — M79604 Pain in right leg: Secondary | ICD-10-CM | POA: Diagnosis not present

## 2019-01-14 DIAGNOSIS — Z9049 Acquired absence of other specified parts of digestive tract: Secondary | ICD-10-CM | POA: Diagnosis not present

## 2019-01-14 DIAGNOSIS — Z6833 Body mass index (BMI) 33.0-33.9, adult: Secondary | ICD-10-CM | POA: Diagnosis not present

## 2019-01-14 DIAGNOSIS — Z882 Allergy status to sulfonamides status: Secondary | ICD-10-CM | POA: Diagnosis not present

## 2019-01-14 DIAGNOSIS — Z86711 Personal history of pulmonary embolism: Secondary | ICD-10-CM | POA: Diagnosis not present

## 2019-01-14 DIAGNOSIS — J1289 Other viral pneumonia: Secondary | ICD-10-CM | POA: Diagnosis not present

## 2019-01-14 DIAGNOSIS — Z881 Allergy status to other antibiotic agents status: Secondary | ICD-10-CM | POA: Diagnosis not present

## 2019-01-14 DIAGNOSIS — Z888 Allergy status to other drugs, medicaments and biological substances status: Secondary | ICD-10-CM | POA: Diagnosis not present

## 2019-01-14 DIAGNOSIS — K219 Gastro-esophageal reflux disease without esophagitis: Secondary | ICD-10-CM | POA: Diagnosis present

## 2019-01-14 DIAGNOSIS — Z91041 Radiographic dye allergy status: Secondary | ICD-10-CM | POA: Diagnosis not present

## 2019-01-14 DIAGNOSIS — Z91012 Allergy to eggs: Secondary | ICD-10-CM | POA: Diagnosis not present

## 2019-01-14 DIAGNOSIS — M79605 Pain in left leg: Secondary | ICD-10-CM | POA: Diagnosis not present

## 2019-01-14 DIAGNOSIS — Z886 Allergy status to analgesic agent status: Secondary | ICD-10-CM | POA: Diagnosis not present

## 2019-01-14 DIAGNOSIS — G4731 Primary central sleep apnea: Secondary | ICD-10-CM | POA: Diagnosis present

## 2019-01-14 LAB — CBC WITH DIFFERENTIAL/PLATELET
Abs Immature Granulocytes: 0.02 10*3/uL (ref 0.00–0.07)
Basophils Absolute: 0 10*3/uL (ref 0.0–0.1)
Basophils Relative: 0 %
Eosinophils Absolute: 0 10*3/uL (ref 0.0–0.5)
Eosinophils Relative: 0 %
HCT: 38.5 % (ref 36.0–46.0)
Hemoglobin: 12.5 g/dL (ref 12.0–15.0)
Immature Granulocytes: 1 %
Lymphocytes Relative: 20 %
Lymphs Abs: 0.6 10*3/uL — ABNORMAL LOW (ref 0.7–4.0)
MCH: 28.9 pg (ref 26.0–34.0)
MCHC: 32.5 g/dL (ref 30.0–36.0)
MCV: 88.9 fL (ref 80.0–100.0)
Monocytes Absolute: 0.1 10*3/uL (ref 0.1–1.0)
Monocytes Relative: 3 %
Neutro Abs: 2.3 10*3/uL (ref 1.7–7.7)
Neutrophils Relative %: 76 %
Platelets: 271 10*3/uL (ref 150–400)
RBC: 4.33 MIL/uL (ref 3.87–5.11)
RDW: 14.4 % (ref 11.5–15.5)
Smear Review: NORMAL
WBC: 3 10*3/uL — ABNORMAL LOW (ref 4.0–10.5)
nRBC: 0 % (ref 0.0–0.2)

## 2019-01-14 LAB — COMPREHENSIVE METABOLIC PANEL
ALT: 20 U/L (ref 0–44)
AST: 28 U/L (ref 15–41)
Albumin: 3.6 g/dL (ref 3.5–5.0)
Alkaline Phosphatase: 82 U/L (ref 38–126)
Anion gap: 8 (ref 5–15)
BUN: 12 mg/dL (ref 6–20)
CO2: 22 mmol/L (ref 22–32)
Calcium: 8.6 mg/dL — ABNORMAL LOW (ref 8.9–10.3)
Chloride: 106 mmol/L (ref 98–111)
Creatinine, Ser: 0.64 mg/dL (ref 0.44–1.00)
GFR calc Af Amer: >60 mL/min (ref >60)
GFR calc non Af Amer: >60 mL/min (ref >60)
Glucose, Bld: 133 mg/dL — ABNORMAL HIGH (ref 70–99)
Potassium: 4.4 mmol/L (ref 3.5–5.1)
Sodium: 136 mmol/L (ref 135–145)
Total Bilirubin: 0.3 mg/dL (ref 0.3–1.2)
Total Protein: 7.7 g/dL (ref 6.5–8.1)

## 2019-01-14 LAB — C-REACTIVE PROTEIN
CRP: 0.9 mg/dL (ref <1.0)
CRP: 1 mg/dL — ABNORMAL HIGH (ref <1.0)

## 2019-01-14 LAB — ABO/RH: ABO/RH(D): A POS

## 2019-01-14 LAB — FIBRIN DERIVATIVES D-DIMER (ARMC ONLY): Fibrin derivatives D-dimer (ARMC): 930.67 ng/mL (FEU) — ABNORMAL HIGH (ref 0.00–499.00)

## 2019-01-14 LAB — CK: Total CK: 100 U/L (ref 38–234)

## 2019-01-14 LAB — HIV ANTIBODY (ROUTINE TESTING W REFLEX): HIV Screen 4th Generation wRfx: NONREACTIVE

## 2019-01-14 MED ORDER — MULTI-VITAMINS PO TABS: ORAL_TABLET | Freq: Every day | ORAL | Status: DC

## 2019-01-14 MED ORDER — MONTELUKAST SODIUM 10 MG PO TABS
10.0000 mg | ORAL_TABLET | Freq: Every day | ORAL | Status: DC
  Administered 2019-01-14 – 2019-01-18 (×5): 10 mg via ORAL
  Filled 2019-01-14 (×5): qty 1

## 2019-01-14 MED ORDER — HYOSCYAMINE SULFATE 0.125 MG PO TBDP
0.1250 mg | ORAL_TABLET | Freq: Three times a day (TID) | ORAL | Status: DC
  Administered 2019-01-14 – 2019-01-18 (×11): 0.125 mg via ORAL
  Filled 2019-01-14 (×14): qty 1

## 2019-01-14 MED ORDER — TOBRAMYCIN 0.3 % OP SOLN
1.0000 [drp] | Freq: Four times a day (QID) | OPHTHALMIC | Status: DC
  Filled 2019-01-14: qty 5

## 2019-01-14 MED ORDER — PANTOPRAZOLE SODIUM 40 MG PO TBEC
40.0000 mg | DELAYED_RELEASE_TABLET | Freq: Every day | ORAL | Status: DC
  Administered 2019-01-14 – 2019-01-18 (×5): 40 mg via ORAL
  Filled 2019-01-14 (×5): qty 1

## 2019-01-14 MED ORDER — EPINEPHRINE 0.3 MG/0.3ML IJ SOAJ
0.3000 mg | Freq: Once | INTRAMUSCULAR | Status: DC | PRN
  Filled 2019-01-14: qty 0.6

## 2019-01-14 MED ORDER — VITAMIN D (ERGOCALCIFEROL) 1.25 MG (50000 UNIT) PO CAPS
50000.0000 [IU] | ORAL_CAPSULE | ORAL | Status: DC
  Administered 2019-01-14: 50000 [IU] via ORAL
  Filled 2019-01-14: qty 1

## 2019-01-14 MED ORDER — FLUTICASONE PROPIONATE 50 MCG/ACT NA SUSP
2.0000 | Freq: Two times a day (BID) | NASAL | Status: DC | PRN
  Filled 2019-01-14: qty 16

## 2019-01-14 MED ORDER — HYDROCODONE-ACETAMINOPHEN 5-325 MG PO TABS
1.0000 | ORAL_TABLET | ORAL | Status: DC | PRN
  Filled 2019-01-14: qty 1

## 2019-01-14 MED ORDER — PNEUMOCOCCAL VAC POLYVALENT 25 MCG/0.5ML IJ INJ
0.5000 mL | INJECTION | INTRAMUSCULAR | Status: AC
  Administered 2019-01-16: 0.5 mL via INTRAMUSCULAR
  Filled 2019-01-14: qty 0.5

## 2019-01-14 MED ORDER — TRIAMCINOLONE ACETONIDE 0.1 % EX CREA
1.0000 "application " | TOPICAL_CREAM | Freq: Two times a day (BID) | CUTANEOUS | Status: DC
  Filled 2019-01-14: qty 15

## 2019-01-14 MED ORDER — BENZONATATE 100 MG PO CAPS
100.0000 mg | ORAL_CAPSULE | Freq: Three times a day (TID) | ORAL | Status: DC | PRN
  Administered 2019-01-14: 200 mg via ORAL
  Filled 2019-01-14: qty 2

## 2019-01-14 MED ORDER — TRIAMCINOLONE ACETONIDE 0.1 % EX CREA
1.0000 "application " | TOPICAL_CREAM | Freq: Two times a day (BID) | CUTANEOUS | Status: DC | PRN
  Filled 2019-01-14: qty 15

## 2019-01-14 NOTE — ED Notes (Signed)
Pt ambulated in room and oxygen saturation dropped to 87% on room air. Pt placed on 2L and assisted back to bed. Oxygen currently 100% on 2L. MD [facility] aware.

## 2019-01-14 NOTE — Progress Notes (Signed)
Subjective: Currently requiring 2 L of supplemental oxygen via nasal cannula.  Was having mild shortness of breath but feels better than this morning.  Objective: Vital signs in last 24 hours: Temp:  [97.7 F (36.5 C)-98.7 F (37.1 C)] 97.7 F (36.5 C) (12/28 0700) Pulse Rate:  [66-92] 72 (12/28 1347) Resp:  [10-26] 16 (12/28 1347) BP: (105-135)/(72-87) 115/76 (12/28 1347) SpO2:  [98 %-100 %] 100 % (12/28 1328) Weight:  [90.7 kg] 90.7 kg (12/27 2107)  Intake/Output from previous day: No intake/output data recorded. Intake/Output this shift: No intake/output data recorded.  Constitutional: NAD, alert and oriented x 3 Eyes: PERRL, lids and conjunctivae normal ENMT: Mucous membranes are moist.  Neck: normal, supple, no masses, no thyromegaly Respiratory: clear to auscultation bilaterally, no wheezing, no crackles. Normal respiratory effort. No accessory muscle use.  Cardiovascular: Regular rate and rhythm, no murmurs / rubs / gallops. No extremity edema. 2+ pedal pulses. No carotid bruits.  Abdomen: no tenderness, no masses palpated. No hepatosplenomegaly. Bowel sounds positive.  Musculoskeletal: no clubbing / cyanosis. No joint deformity upper and lower extremities.  Sore to the touch on palpation of bilateral legs Skin: no rashes, lesions, ulcers.  Neurologic: No gross focal neurologic deficit. Psychiatric: Normal mood and affect.   Results for orders placed or performed during the hospital encounter of 01/13/19 (from the past 24 hour(s))  Comprehensive metabolic panel     Status: Abnormal   Collection Time: 01/13/19  8:56 PM  Result Value Ref Range   Sodium 136 135 - 145 mmol/L   Potassium 3.7 3.5 - 5.1 mmol/L   Chloride 105 98 - 111 mmol/L   CO2 20 (L) 22 - 32 mmol/L   Glucose, Bld 98 70 - 99 mg/dL   BUN 11 6 - 20 mg/dL   Creatinine, Ser 0.73 0.44 - 1.00 mg/dL   Calcium 8.4 (L) 8.9 - 10.3 mg/dL   Total Protein 7.5 6.5 - 8.1 g/dL   Albumin 3.7 3.5 - 5.0 g/dL   AST 28  15 - 41 U/L   ALT 19 0 - 44 U/L   Alkaline Phosphatase 83 38 - 126 U/L   Total Bilirubin 0.4 0.3 - 1.2 mg/dL   GFR calc non Af Amer >60 >60 mL/min   GFR calc Af Amer >60 >60 mL/min   Anion gap 11 5 - 15  CBC with Differential     Status: Abnormal   Collection Time: 01/13/19  8:56 PM  Result Value Ref Range   WBC 3.9 (L) 4.0 - 10.5 K/uL   RBC 4.36 3.87 - 5.11 MIL/uL   Hemoglobin 12.8 12.0 - 15.0 g/dL   HCT 38.3 36.0 - 46.0 %   MCV 87.8 80.0 - 100.0 fL   MCH 29.4 26.0 - 34.0 pg   MCHC 33.4 30.0 - 36.0 g/dL   RDW 14.4 11.5 - 15.5 %   Platelets 263 150 - 400 K/uL   nRBC 0.0 0.0 - 0.2 %   Neutrophils Relative % 63 %   Neutro Abs 2.5 1.7 - 7.7 K/uL   Lymphocytes Relative 28 %   Lymphs Abs 1.1 0.7 - 4.0 K/uL   Monocytes Relative 9 %   Monocytes Absolute 0.4 0.1 - 1.0 K/uL   Eosinophils Relative 0 %   Eosinophils Absolute 0.0 0.0 - 0.5 K/uL   Basophils Relative 0 %   Basophils Absolute 0.0 0.0 - 0.1 K/uL   Immature Granulocytes 0 %   Abs Immature Granulocytes 0.01 0.00 - 0.07 K/uL  C-reactive protein     Status: None   Collection Time: 01/13/19  8:56 PM  Result Value Ref Range   CRP 0.9 <1.0 mg/dL  Ferritin (Iron Binding Protein)     Status: None   Collection Time: 01/13/19  8:56 PM  Result Value Ref Range   Ferritin 14 11 - 307 ng/mL  Lactate dehydrogenase     Status: None   Collection Time: 01/13/19  8:56 PM  Result Value Ref Range   LDH 169 98 - 192 U/L  Fibrin derivatives D-Dimer     Status: Abnormal   Collection Time: 01/13/19  8:56 PM  Result Value Ref Range   Fibrin derivatives D-dimer ([facility]) 914.16 (H) 0.00 - 499.00 ng/mL (FEU)  ABO/Rh     Status: None   Collection Time: 01/13/19  8:56 PM  Result Value Ref Range   ABO/RH(D)      A POS Performed at [facility], [address] [place], [place] [address]   HIV Antibody (routine testing w rflx)     Status: None   Collection Time: 01/14/19  5:14 AM  Result Value Ref Range   HIV Screen 4th Generation  wRfx NON REACTIVE NON REACTIVE  CBC with Differential/Platelet     Status: Abnormal   Collection Time: 01/14/19  5:14 AM  Result Value Ref Range   WBC 3.0 (L) 4.0 - 10.5 K/uL   RBC 4.33 3.87 - 5.11 MIL/uL   Hemoglobin 12.5 12.0 - 15.0 g/dL   HCT 38.5 36.0 - 46.0 %   MCV 88.9 80.0 - 100.0 fL   MCH 28.9 26.0 - 34.0 pg   MCHC 32.5 30.0 - 36.0 g/dL   RDW 14.4 11.5 - 15.5 %   Platelets 271 150 - 400 K/uL   nRBC 0.0 0.0 - 0.2 %   Neutrophils Relative % 76 %   Neutro Abs 2.3 1.7 - 7.7 K/uL   Lymphocytes Relative 20 %   Lymphs Abs 0.6 (L) 0.7 - 4.0 K/uL   Monocytes Relative 3 %   Monocytes Absolute 0.1 0.1 - 1.0 K/uL   Eosinophils Relative 0 %   Eosinophils Absolute 0.0 0.0 - 0.5 K/uL   Basophils Relative 0 %   Basophils Absolute 0.0 0.0 - 0.1 K/uL   RBC Morphology MORPHOLOGY UNREMARKABLE    Smear Review Normal platelet morphology    Immature Granulocytes 1 %   Abs Immature Granulocytes 0.02 0.00 - 0.07 K/uL   Reactive, Benign Lymphocytes PRESENT   C-reactive protein     Status: Abnormal   Collection Time: 01/14/19  5:14 AM  Result Value Ref Range   CRP 1.0 (H) <1.0 mg/dL  Fibrin derivatives D-Dimer ([facility] only)     Status: Abnormal   Collection Time: 01/14/19  5:14 AM  Result Value Ref Range   Fibrin derivatives D-dimer ([facility]) 930.67 (H) 0.00 - 499.00 ng/mL (FEU)  CK     Status: None   Collection Time: 01/14/19  5:14 AM  Result Value Ref Range   Total CK 100 38 - 234 U/L  Comprehensive metabolic panel     Status: Abnormal   Collection Time: 01/14/19  5:14 AM  Result Value Ref Range   Sodium 136 135 - 145 mmol/L   Potassium 4.4 3.5 - 5.1 mmol/L   Chloride 106 98 - 111 mmol/L   CO2 22 22 - 32 mmol/L   Glucose, Bld 133 (H) 70 - 99 mg/dL   BUN 12 6 - 20 mg/dL   Creatinine, Ser  0.64 0.44 - 1.00 mg/dL   Calcium 8.6 (L) 8.9 - 10.3 mg/dL   Total Protein 7.7 6.5 - 8.1 g/dL   Albumin 3.6 3.5 - 5.0 g/dL   AST 28 15 - 41 U/L   ALT 20 0 - 44 U/L   Alkaline Phosphatase 82 38 - 126  U/L   Total Bilirubin 0.3 0.3 - 1.2 mg/dL   GFR calc non Af Amer >60 >60 mL/min   GFR calc Af Amer >60 >60 mL/min   Anion gap 8 5 - 15    Studies/Results: CT Abdomen Pelvis W Contrast  Result Date: 12/26/2018 CLINICAL DATA:  Left lower quadrant abdominal pain x2 years EXAM: CT ABDOMEN AND PELVIS WITH CONTRAST TECHNIQUE: Multidetector CT imaging of the abdomen and pelvis was performed using the standard protocol following bolus administration of intravenous contrast. CONTRAST:  124mL OMNIPAQUE IOHEXOL 300 MG/ML  SOLN COMPARISON:  10/02/2016 FINDINGS: Lower chest: The lung bases are clear. The heart size is normal. Hepatobiliary: The liver is normal. Status post cholecystectomy.There is no biliary ductal dilation. Pancreas: Normal contours without ductal dilatation. No peripancreatic fluid collection. Spleen: No splenic laceration or hematoma. Adrenals/Urinary Tract: --Adrenal glands: No adrenal hemorrhage. --Right kidney/ureter: There is a punctate nonobstructing stone in the interpolar region of the right kidney. --Left kidney/ureter: There is a prominent extrarenal pelvis on the left without evidence for frank hydronephrosis. --Urinary bladder: Unremarkable. Stomach/Bowel: --Stomach/Duodenum: The patient is status post prior gastric bypass. --Small bowel: No dilatation or inflammation. --Colon: No focal abnormality. --Appendix: Normal. Vascular/Lymphatic: Normal course and caliber of the major abdominal vessels. --No retroperitoneal lymphadenopathy. --No mesenteric lymphadenopathy. --No pelvic or inguinal lymphadenopathy. Reproductive: There is an IUD in place. Other: No ascites or free air. The abdominal wall is normal. Musculoskeletal. No acute displaced fractures. IMPRESSION: 1. No acute abdominopelvic abnormality. 2. Status post gastric bypass without evidence for obstruction. 3. Punctate nonobstructing stone in the interpolar region of the right kidney. Electronically Signed   By: Constance Holster  M.D.   On: 12/26/2018 21:07   US Venous Img Lower Bilateral (DVT)  Result Date: 01/13/2019 CLINICAL DATA:  Shortness of breath and bilateral leg pain EXAM: BILATERAL LOWER EXTREMITY VENOUS DOPPLER ULTRASOUND TECHNIQUE: Gray-scale sonography with graded compression, as well as color Doppler and duplex ultrasound were performed to evaluate the lower extremity deep venous systems from the level of the common femoral vein and including the common femoral, femoral, profunda femoral, popliteal and calf veins including the posterior tibial, peroneal and gastrocnemius veins when visible. The superficial great saphenous vein was also interrogated. Spectral Doppler was utilized to evaluate flow at rest and with distal augmentation maneuvers in the common femoral, femoral and popliteal veins. COMPARISON:  None. FINDINGS: RIGHT LOWER EXTREMITY Common Femoral Vein: No evidence of thrombus. Normal compressibility, respiratory phasicity and response to augmentation. Saphenofemoral Junction: No evidence of thrombus. Normal compressibility and flow on color Doppler imaging. Profunda Femoral Vein: No evidence of thrombus. Normal compressibility and flow on color Doppler imaging. Femoral Vein: No evidence of thrombus. Normal compressibility, respiratory phasicity and response to augmentation. Popliteal Vein: No evidence of thrombus. Normal compressibility, respiratory phasicity and response to augmentation. Calf Veins: No evidence of thrombus. Normal compressibility and flow on color Doppler imaging. Superficial Great Saphenous Vein: No evidence of thrombus. Normal compressibility. Venous Reflux:  None. Other Findings:  None. LEFT LOWER EXTREMITY Common Femoral Vein: No evidence of thrombus. Normal compressibility, respiratory phasicity and response to augmentation. Saphenofemoral Junction: No evidence of thrombus. Normal compressibility and flow  on color Doppler imaging. Profunda Femoral Vein: No evidence of thrombus. Normal  compressibility and flow on color Doppler imaging. Femoral Vein: No evidence of thrombus. Normal compressibility, respiratory phasicity and response to augmentation. Popliteal Vein: No evidence of thrombus. Normal compressibility, respiratory phasicity and response to augmentation. Calf Veins: No evidence of thrombus. Normal compressibility and flow on color Doppler imaging. Superficial Great Saphenous Vein: No evidence of thrombus. Normal compressibility. Venous Reflux:  None. Other Findings:  None. IMPRESSION: No evidence of deep venous thrombosis in either lower extremity. Electronically Signed   By: Prudencio Pair M.D.   On: 01/13/2019 23:02   DG Chest Port 1 View  Result Date: 01/13/2019 CLINICAL DATA:  Shortness of breath, COVID-19 diagnose 01/10/2019 EXAM: PORTABLE CHEST 1 VIEW COMPARISON:  Radiograph 01/31/2018, chest CTA 10/26/2017 FINDINGS: Patchy opacity in the left lung periphery. More streaky atelectatic opacities are present in the lung bases. No pneumothorax or visible effusion. Cardiomediastinal contours are unremarkable for portable technique. No acute osseous or soft tissue abnormality. IMPRESSION: 1. Patchy opacity in the left lung periphery suspicious for pneumonia given COVID-19 positivity. 2. More streaky atelectatic opacities in the lung bases. Electronically Signed   By: Lovena Le M.D.   On: 01/13/2019 21:46   MM 3D SCREEN BREAST BILATERAL  Result Date: 12/26/2018 CLINICAL DATA:  Screening. EXAM: DIGITAL SCREENING BILATERAL MAMMOGRAM WITH TOMO AND CAD COMPARISON:  Previous exam(s). ACR Breast Density Category b: There are scattered areas of fibroglandular density. FINDINGS: There are no findings suspicious for malignancy. Images were processed with CAD. IMPRESSION: No mammographic evidence of malignancy. A result letter of this screening mammogram will be mailed directly to the patient. RECOMMENDATION: Screening mammogram in one year. (Code:SM-B-01Y) BI-RADS CATEGORY  1: Negative.  Electronically Signed   By: Margarette Canada M.D.   On: 12/26/2018 16:21    Scheduled Meds: . albuterol  2 puff Inhalation Q6H  . vitamin C  500 mg Oral Daily  . dexamethasone (DECADRON) injection  6 mg Intravenous QHS  . enoxaparin (LOVENOX) injection  40 mg Subcutaneous Q24H  . multivitamin with minerals  1 tablet Oral Daily  . zinc sulfate  220 mg Oral Daily   Continuous Infusions: . 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

## 2019-01-15 ENCOUNTER — Inpatient Hospital Stay: Payer: BC Managed Care – PPO

## 2019-01-15 ENCOUNTER — Ambulatory Visit (HOSPITAL_COMMUNITY): Payer: BC Managed Care – PPO

## 2019-01-15 LAB — CBC WITH DIFFERENTIAL/PLATELET
Abs Immature Granulocytes: 0.01 10*3/uL (ref 0.00–0.07)
Basophils Absolute: 0 10*3/uL (ref 0.0–0.1)
Basophils Relative: 0 %
Eosinophils Absolute: 0 10*3/uL (ref 0.0–0.5)
Eosinophils Relative: 0 %
HCT: 37.9 % (ref 36.0–46.0)
Hemoglobin: 12.2 g/dL (ref 12.0–15.0)
Immature Granulocytes: 0 %
Lymphocytes Relative: 15 %
Lymphs Abs: 0.8 10*3/uL (ref 0.7–4.0)
MCH: 28.4 pg (ref 26.0–34.0)
MCHC: 32.2 g/dL (ref 30.0–36.0)
MCV: 88.3 fL (ref 80.0–100.0)
Monocytes Absolute: 0.2 10*3/uL (ref 0.1–1.0)
Monocytes Relative: 5 %
Neutro Abs: 3.9 10*3/uL (ref 1.7–7.7)
Neutrophils Relative %: 80 %
Platelets: 288 10*3/uL (ref 150–400)
RBC: 4.29 MIL/uL (ref 3.87–5.11)
RDW: 14.4 % (ref 11.5–15.5)
WBC: 4.9 10*3/uL (ref 4.0–10.5)
nRBC: 0 % (ref 0.0–0.2)

## 2019-01-15 LAB — COMPREHENSIVE METABOLIC PANEL
ALT: 19 U/L (ref 0–44)
AST: 22 U/L (ref 15–41)
Albumin: 3.4 g/dL — ABNORMAL LOW (ref 3.5–5.0)
Alkaline Phosphatase: 86 U/L (ref 38–126)
Anion gap: 8 (ref 5–15)
BUN: 17 mg/dL (ref 6–20)
CO2: 24 mmol/L (ref 22–32)
Calcium: 8.7 mg/dL — ABNORMAL LOW (ref 8.9–10.3)
Chloride: 106 mmol/L (ref 98–111)
Creatinine, Ser: 0.67 mg/dL (ref 0.44–1.00)
GFR calc Af Amer: >60 mL/min (ref >60)
GFR calc non Af Amer: >60 mL/min (ref >60)
Glucose, Bld: 133 mg/dL — ABNORMAL HIGH (ref 70–99)
Potassium: 4.6 mmol/L (ref 3.5–5.1)
Sodium: 138 mmol/L (ref 135–145)
Total Bilirubin: 0.4 mg/dL (ref 0.3–1.2)
Total Protein: 7.4 g/dL (ref 6.5–8.1)

## 2019-01-15 LAB — C-REACTIVE PROTEIN: CRP: 0.9 mg/dL (ref <1.0)

## 2019-01-15 LAB — FIBRIN DERIVATIVES D-DIMER (ARMC ONLY): Fibrin derivatives D-dimer (ARMC): 745.47 ng/mL (FEU) — ABNORMAL HIGH (ref 0.00–499.00)

## 2019-01-15 MED ORDER — PSYLLIUM 95 % PO PACK
1.0000 | PACK | Freq: Every day | ORAL | Status: DC
  Administered 2019-01-15: 1 via ORAL
  Filled 2019-01-15 (×4): qty 1

## 2019-01-15 MED ORDER — MAGNESIUM HYDROXIDE 400 MG/5ML PO SUSP
15.0000 mL | Freq: Every day | ORAL | Status: DC | PRN
  Administered 2019-01-15: 15 mL via ORAL
  Filled 2019-01-15: qty 30

## 2019-01-15 MED ORDER — LINACLOTIDE 145 MCG PO CAPS
145.0000 ug | ORAL_CAPSULE | Freq: Every day | ORAL | Status: DC
  Administered 2019-01-16 – 2019-01-18 (×3): 145 ug via ORAL
  Filled 2019-01-15 (×3): qty 1

## 2019-01-15 MED ORDER — SODIUM ZIRCONIUM CYCLOSILICATE 5 G PO PACK
5.0000 g | PACK | Freq: Once | ORAL | Status: AC
  Administered 2019-01-15: 5 g via ORAL
  Filled 2019-01-15: qty 1

## 2019-01-15 MED ORDER — ACETAMINOPHEN 325 MG PO TABS
650.0000 mg | ORAL_TABLET | Freq: Four times a day (QID) | ORAL | Status: DC | PRN
  Administered 2019-01-15 – 2019-01-16 (×3): 650 mg via ORAL
  Administered 2019-01-16: 325 mg via ORAL
  Administered 2019-01-17 (×2): 650 mg via ORAL
  Filled 2019-01-15 (×6): qty 2

## 2019-01-15 NOTE — Progress Notes (Signed)
Subjective: Currently requiring 2 L of supplemental oxygen via nasal cannula. Saying having some abdominal pain and has not had a BM in a couple of days. Denies any fever.   Objective: Vital signs in last 24 hours: Temp:  [97.8 F (36.6 C)-98.5 F (36.9 C)] 97.8 F (36.6 C) (12/29 1211) Pulse Rate:  [60-71] 60 (12/29 1211) Resp:  [20] 20 (12/29 1211) BP: (92-104)/(63-72) 104/72 (12/29 1211) SpO2:  [97 %-100 %] 100 % (12/29 1211)  Intake/Output from previous day: 12/28 0701 - 12/29 0700 In: 94.5  Out: 400 [Urine:400] Intake/Output this shift: No intake/output data recorded.  Constitutional: NAD, alert and oriented x 3 Eyes: PERRL, lids and conjunctivae normal ENMT: Mucous membranes are moist.  Neck: normal, supple, no masses, no thyromegaly Respiratory: clear to auscultation bilaterally, no wheezing, no crackles. Normal respiratory effort. No accessory muscle use.  Cardiovascular: Regular rate and rhythm, no murmurs / rubs / gallops. No extremity edema. 2+ pedal pulses. No carotid bruits.  Abdomen: no tenderness, no masses palpated. No hepatosplenomegaly. Bowel sounds positive.  Musculoskeletal: no clubbing / cyanosis. No joint deformity upper and lower extremities.  Sore to the touch on palpation of bilateral legs Skin: no rashes, lesions, ulcers.  Neurologic: No gross focal neurologic deficit. Psychiatric: Normal mood and affect.   Results for orders placed or performed during the hospital encounter of 01/13/19 (from the past 24 hour(s))  CBC with Differential/Platelet     Status: None   Collection Time: 01/15/19  2:58 AM  Result Value Ref Range   WBC 4.9 4.0 - 10.5 K/uL   RBC 4.29 3.87 - 5.11 MIL/uL   Hemoglobin 12.2 12.0 - 15.0 g/dL   HCT 37.9 36.0 - 46.0 %   MCV 88.3 80.0 - 100.0 fL   MCH 28.4 26.0 - 34.0 pg   MCHC 32.2 30.0 - 36.0 g/dL   RDW 14.4 11.5 - 15.5 %   Platelets 288 150 - 400 K/uL   nRBC 0.0 0.0 - 0.2 %   Neutrophils Relative % 80 %   Neutro Abs 3.9  1.7 - 7.7 K/uL   Lymphocytes Relative 15 %   Lymphs Abs 0.8 0.7 - 4.0 K/uL   Monocytes Relative 5 %   Monocytes Absolute 0.2 0.1 - 1.0 K/uL   Eosinophils Relative 0 %   Eosinophils Absolute 0.0 0.0 - 0.5 K/uL   Basophils Relative 0 %   Basophils Absolute 0.0 0.0 - 0.1 K/uL   Immature Granulocytes 0 %   Abs Immature Granulocytes 0.01 0.00 - 0.07 K/uL  C-reactive protein     Status: None   Collection Time: 01/15/19  2:58 AM  Result Value Ref Range   CRP 0.9 <1.0 mg/dL  Fibrin derivatives D-Dimer ([facility] only)     Status: Abnormal   Collection Time: 01/15/19  2:58 AM  Result Value Ref Range   Fibrin derivatives D-dimer ([facility]) 745.47 (H) 0.00 - 499.00 ng/mL (FEU)  Comprehensive metabolic panel     Status: Abnormal   Collection Time: 01/15/19  2:58 AM  Result Value Ref Range   Sodium 138 135 - 145 mmol/L   Potassium 4.6 3.5 - 5.1 mmol/L   Chloride 106 98 - 111 mmol/L   CO2 24 22 - 32 mmol/L   Glucose, Bld 133 (H) 70 - 99 mg/dL   BUN 17 6 - 20 mg/dL   Creatinine, Ser 0.67 0.44 - 1.00 mg/dL   Calcium 8.7 (L) 8.9 - 10.3 mg/dL   Total Protein 7.4 6.5 - 8.1  g/dL   Albumin 3.4 (L) 3.5 - 5.0 g/dL   AST 22 15 - 41 U/L   ALT 19 0 - 44 U/L   Alkaline Phosphatase 86 38 - 126 U/L   Total Bilirubin 0.4 0.3 - 1.2 mg/dL   GFR calc non Af Amer >60 >60 mL/min   GFR calc Af Amer >60 >60 mL/min   Anion gap 8 5 - 15    Studies/Results: DG Abd 1 View  Result Date: 01/15/2019 CLINICAL DATA:  Onset abdominal pain this morning in a patient who is COVID-19 positive. EXAM: ABDOMEN - 1 VIEW COMPARISON:  Single-view of the abdomen 08/11/2004. FINDINGS: The bowel gas pattern is nonobstructive. There is a large volume of stool in the ascending and transverse colon. No abnormal abdominal calcification is seen. IUD is in place. IMPRESSION: No acute finding. Large volume of stool ascending and transverse colon. Electronically Signed   By: Inge Rise M.D.   On: 01/15/2019 13:52   CT Abdomen Pelvis W  Contrast  Result Date: 12/26/2018 CLINICAL DATA:  Left lower quadrant abdominal pain x2 years EXAM: CT ABDOMEN AND PELVIS WITH CONTRAST TECHNIQUE: Multidetector CT imaging of the abdomen and pelvis was performed using the standard protocol following bolus administration of intravenous contrast. CONTRAST:  148mL OMNIPAQUE IOHEXOL 300 MG/ML  SOLN COMPARISON:  10/02/2016 FINDINGS: Lower chest: The lung bases are clear. The heart size is normal. Hepatobiliary: The liver is normal. Status post cholecystectomy.There is no biliary ductal dilation. Pancreas: Normal contours without ductal dilatation. No peripancreatic fluid collection. Spleen: No splenic laceration or hematoma. Adrenals/Urinary Tract: --Adrenal glands: No adrenal hemorrhage. --Right kidney/ureter: There is a punctate nonobstructing stone in the interpolar region of the right kidney. --Left kidney/ureter: There is a prominent extrarenal pelvis on the left without evidence for frank hydronephrosis. --Urinary bladder: Unremarkable. Stomach/Bowel: --Stomach/Duodenum: The patient is status post prior gastric bypass. --Small bowel: No dilatation or inflammation. --Colon: No focal abnormality. --Appendix: Normal. Vascular/Lymphatic: Normal course and caliber of the major abdominal vessels. --No retroperitoneal lymphadenopathy. --No mesenteric lymphadenopathy. --No pelvic or inguinal lymphadenopathy. Reproductive: There is an IUD in place. Other: No ascites or free air. The abdominal wall is normal. Musculoskeletal. No acute displaced fractures. IMPRESSION: 1. No acute abdominopelvic abnormality. 2. Status post gastric bypass without evidence for obstruction. 3. Punctate nonobstructing stone in the interpolar region of the right kidney. Electronically Signed   By: Constance Holster M.D.   On: 12/26/2018 21:07   US Venous Img Lower Bilateral (DVT)  Result Date: 01/13/2019 CLINICAL DATA:  Shortness of breath and bilateral leg pain EXAM: BILATERAL LOWER  EXTREMITY VENOUS DOPPLER ULTRASOUND TECHNIQUE: Gray-scale sonography with graded compression, as well as color Doppler and duplex ultrasound were performed to evaluate the lower extremity deep venous systems from the level of the common femoral vein and including the common femoral, femoral, profunda femoral, popliteal and calf veins including the posterior tibial, peroneal and gastrocnemius veins when visible. The superficial great saphenous vein was also interrogated. Spectral Doppler was utilized to evaluate flow at rest and with distal augmentation maneuvers in the common femoral, femoral and popliteal veins. COMPARISON:  None. FINDINGS: RIGHT LOWER EXTREMITY Common Femoral Vein: No evidence of thrombus. Normal compressibility, respiratory phasicity and response to augmentation. Saphenofemoral Junction: No evidence of thrombus. Normal compressibility and flow on color Doppler imaging. Profunda Femoral Vein: No evidence of thrombus. Normal compressibility and flow on color Doppler imaging. Femoral Vein: No evidence of thrombus. Normal compressibility, respiratory phasicity and response to augmentation. Popliteal Vein:  No evidence of thrombus. Normal compressibility, respiratory phasicity and response to augmentation. Calf Veins: No evidence of thrombus. Normal compressibility and flow on color Doppler imaging. Superficial Great Saphenous Vein: No evidence of thrombus. Normal compressibility. Venous Reflux:  None. Other Findings:  None. LEFT LOWER EXTREMITY Common Femoral Vein: No evidence of thrombus. Normal compressibility, respiratory phasicity and response to augmentation. Saphenofemoral Junction: No evidence of thrombus. Normal compressibility and flow on color Doppler imaging. Profunda Femoral Vein: No evidence of thrombus. Normal compressibility and flow on color Doppler imaging. Femoral Vein: No evidence of thrombus. Normal compressibility, respiratory phasicity and response to augmentation. Popliteal  Vein: No evidence of thrombus. Normal compressibility, respiratory phasicity and response to augmentation. Calf Veins: No evidence of thrombus. Normal compressibility and flow on color Doppler imaging. Superficial Great Saphenous Vein: No evidence of thrombus. Normal compressibility. Venous Reflux:  None. Other Findings:  None. IMPRESSION: No evidence of deep venous thrombosis in either lower extremity. Electronically Signed   By: Prudencio Pair M.D.   On: 01/13/2019 23:02   DG Chest Port 1 View  Result Date: 01/13/2019 CLINICAL DATA:  Shortness of breath, COVID-19 diagnose 01/10/2019 EXAM: PORTABLE CHEST 1 VIEW COMPARISON:  Radiograph 01/31/2018, chest CTA 10/26/2017 FINDINGS: Patchy opacity in the left lung periphery. More streaky atelectatic opacities are present in the lung bases. No pneumothorax or visible effusion. Cardiomediastinal contours are unremarkable for portable technique. No acute osseous or soft tissue abnormality. IMPRESSION: 1. Patchy opacity in the left lung periphery suspicious for pneumonia given COVID-19 positivity. 2. More streaky atelectatic opacities in the lung bases. Electronically Signed   By: Lovena Le M.D.   On: 01/13/2019 21:46   MM 3D SCREEN BREAST BILATERAL  Result Date: 12/26/2018 CLINICAL DATA:  Screening. EXAM: DIGITAL SCREENING BILATERAL MAMMOGRAM WITH TOMO AND CAD COMPARISON:  Previous exam(s). ACR Breast Density Category b: There are scattered areas of fibroglandular density. FINDINGS: There are no findings suspicious for malignancy. Images were processed with CAD. IMPRESSION: No mammographic evidence of malignancy. A result letter of this screening mammogram will be mailed directly to the patient. RECOMMENDATION: Screening mammogram in one year. (Code:SM-B-01Y) BI-RADS CATEGORY  1: Negative. Electronically Signed   By: Margarette Canada M.D.   On: 12/26/2018 16:21    Scheduled Meds: . albuterol  2 puff Inhalation Q6H  . vitamin C  500 mg Oral Daily  . dexamethasone  (DECADRON) injection  6 mg Intravenous QHS  . enoxaparin (LOVENOX) injection  40 mg Subcutaneous Q24H  . hyoscyamine  0.125 mg Oral TID  . [START ON 01/16/2019] linaclotide  145 mcg Oral QAC breakfast  . montelukast  10 mg Oral Daily  . multivitamin with minerals  1 tablet Oral Daily  . pantoprazole  40 mg Oral Daily  . pneumococcal 23 valent vaccine  0.5 mL Intramuscular Tomorrow-1000  . psyllium  1 packet Oral Daily  . Vitamin D (Ergocalciferol)  50,000 Units Oral Weekly  . zinc sulfate  220 mg Oral Daily   Continuous Infusions: . remdesivir 100 mg in NS 100 mL 100 mg (01/15/19 1357)   PRN Meds:acetaminophen, benzonatate, EPINEPHrine, fluticasone, guaiFENesin-dextromethorphan, HYDROcodone-acetaminophen, magnesium hydroxide, triamcinolone cream  Assessment/Plan:  Active Problems:    Pneumonia due to COVID-19 virus with hypoxia:  --Cont IV dexamethasone, remdesivir per orders as well as multivitamins --Oxygen to keep sats over 90% -Bronchodilators -Montelukast and fluticasone home meds --Follow inflammatory biomarkers    Bilateral leg pain/myalgia --Negative bilateral lower extremity venous Dopplers --CPK 123XX123  --Etiology uncertain but patient does have  a history of PE but no evidence of DVT at this time - Continue home pain medicine   Abdominal Pain: On 12/9 CT-Ab/Pelvis shows no acute findings  - Pt is S/P gastric bypass  - May obtain a KUB --shows no acute findings with large volume of stools - Bowel regimen PRN if KUB not too concerning  - if hydration   Chronic constipation: Started bowel regimen -Added home meds Linzess -May rectal suppository if needed  DVT prophylaxis: lovenox  Code Status: full  Family Communication: none  Disposition Plan: Back to previous home environment Consults called: none   LOS: 1 day   [employer]

## 2019-01-16 DIAGNOSIS — Z86711 Personal history of pulmonary embolism: Secondary | ICD-10-CM

## 2019-01-16 LAB — COMPREHENSIVE METABOLIC PANEL
ALT: 17 U/L (ref 0–44)
AST: 22 U/L (ref 15–41)
Albumin: 3.4 g/dL — ABNORMAL LOW (ref 3.5–5.0)
Alkaline Phosphatase: 81 U/L (ref 38–126)
Anion gap: 7 (ref 5–15)
BUN: 17 mg/dL (ref 6–20)
CO2: 25 mmol/L (ref 22–32)
Calcium: 8.5 mg/dL — ABNORMAL LOW (ref 8.9–10.3)
Chloride: 106 mmol/L (ref 98–111)
Creatinine, Ser: 0.54 mg/dL (ref 0.44–1.00)
GFR calc Af Amer: >60 mL/min (ref >60)
GFR calc non Af Amer: >60 mL/min (ref >60)
Glucose, Bld: 134 mg/dL — ABNORMAL HIGH (ref 70–99)
Potassium: 4.4 mmol/L (ref 3.5–5.1)
Sodium: 138 mmol/L (ref 135–145)
Total Bilirubin: 0.6 mg/dL (ref 0.3–1.2)
Total Protein: 7.1 g/dL (ref 6.5–8.1)

## 2019-01-16 LAB — CBC WITH DIFFERENTIAL/PLATELET
Abs Immature Granulocytes: 0.02 10*3/uL (ref 0.00–0.07)
Basophils Absolute: 0 10*3/uL (ref 0.0–0.1)
Basophils Relative: 0 %
Eosinophils Absolute: 0 10*3/uL (ref 0.0–0.5)
Eosinophils Relative: 0 %
HCT: 34.6 % — ABNORMAL LOW (ref 36.0–46.0)
Hemoglobin: 11.7 g/dL — ABNORMAL LOW (ref 12.0–15.0)
Immature Granulocytes: 0 %
Lymphocytes Relative: 16 %
Lymphs Abs: 0.8 10*3/uL (ref 0.7–4.0)
MCH: 28.5 pg (ref 26.0–34.0)
MCHC: 33.8 g/dL (ref 30.0–36.0)
MCV: 84.4 fL (ref 80.0–100.0)
Monocytes Absolute: 0.2 10*3/uL (ref 0.1–1.0)
Monocytes Relative: 4 %
Neutro Abs: 3.8 10*3/uL (ref 1.7–7.7)
Neutrophils Relative %: 80 %
Platelets: 302 10*3/uL (ref 150–400)
RBC: 4.1 MIL/uL (ref 3.87–5.11)
RDW: 14.5 % (ref 11.5–15.5)
WBC: 4.8 10*3/uL (ref 4.0–10.5)
nRBC: 0 % (ref 0.0–0.2)

## 2019-01-16 LAB — FIBRIN DERIVATIVES D-DIMER (ARMC ONLY): Fibrin derivatives D-dimer (ARMC): 622.46 ng/mL (FEU) — ABNORMAL HIGH (ref 0.00–499.00)

## 2019-01-16 LAB — C-REACTIVE PROTEIN: CRP: 0.7 mg/dL (ref <1.0)

## 2019-01-16 MED ORDER — BISACODYL 10 MG RE SUPP: 10.0000 mg | Freq: Every day | RECTAL | Status: DC | PRN

## 2019-01-16 MED ORDER — POLYETHYLENE GLYCOL 3350 17 G PO PACK
17.0000 g | PACK | Freq: Every day | ORAL | Status: DC
  Filled 2019-01-16 (×3): qty 1

## 2019-01-16 MED ORDER — SENNOSIDES-DOCUSATE SODIUM 8.6-50 MG PO TABS
1.0000 | ORAL_TABLET | Freq: Two times a day (BID) | ORAL | Status: DC
  Filled 2019-01-16 (×4): qty 1

## 2019-01-16 NOTE — Progress Notes (Signed)
Dr. Priscella Mann notified me of a PO bowel regimen for this patient because of large stool burden but she had a large BM this morning and took linzess but not metamucil.

## 2019-01-16 NOTE — Progress Notes (Signed)
PROGRESS NOTE    Debbie Sosa  [id number] DOB: 1975/07/18 DOA: 01/13/2019 PCP: Elgie Collard, MD    Brief Narrative: Currently requiring 2 L of supplemental oxygen via nasal cannula. Saying having some abdominal pain and has not had a BM in a couple of days. Denies any fever.    Assessment & Plan:   Active Problems:   Hypoxia   Pneumonia due to COVID-19 virus   Personal history of pulmonary embolism   Bilateral leg pain  Pneumonia due to COVID-19 virus with hypoxia:  --Cont IV dexamethasone, remdesivir per orders as well as multivitamins (day 3) --Oxygen to keep sats over 90% -Bronchodilators -Montelukast and fluticasone home meds --Follow inflammatory biomarkers  Bilateral leg pain/myalgia --Negative bilateral lower extremity venous Dopplers --CPK 123XX123  --Etiology uncertain but patient does have a history of PE but no evidence of DVT at this time - Continue home pain medicine  Abdominal Pain Chronic constipation On 12/9 CT-Ab/Pelvis shows no acute findings  - Pt is S/P gastric bypass  - May obtain a KUB --shows no acute findings with large volume of stools - Had 1 BM 12/30 - Continue PO bowel regimen - Rectal suppository prn   DVT prophylaxis:lovenox  Code Status:full Family Communication:none Disposition Plan:Back to previous home environment Consults called:none .   Consultants:   none  Procedures: none  Antimicrobials: none   Subjective: Seen and examined 1 BM this morning Feeling better overall  Objective: Vitals:   01/15/19 1211 01/15/19 2028 01/16/19 0335 01/16/19 1207  BP: 104/72 94/66 101/78 94/73  Pulse: 60 63 72   Resp: 20 18 18 18   Temp: 97.8 F (36.6 C) 97.6 F (36.4 C) (!) 97.5 F (36.4 C) (!) 97.5 F (36.4 C)  TempSrc: Oral Oral Oral Oral  SpO2: 100% 100% 98%   Weight:      Height:        Intake/Output Summary (Last 24 hours) at 01/16/2019 1559 Last data filed at 01/16/2019 1035 Gross per 24  hour  Intake 118 ml  Output 1000 ml  Net -882 ml   Filed Weights   01/13/19 2107  Weight: 90.7 kg    Examination:  General exam: Appears calm and comfortable  Respiratory system: Clear to auscultation. Respiratory effort normal. Cardiovascular system: S1 & S2 heard, RRR. No JVD, murmurs, rubs, gallops or clicks. No pedal edema. Gastrointestinal system: Abdomen is nondistended, soft and nontender. No organomegaly or masses felt. Normal bowel sounds heard. Central nervous system: Alert and oriented. No focal neurological deficits. Extremities: Symmetric 5 x 5 power. Skin: No rashes, lesions or ulcers Psychiatry: Judgement and insight appear normal. Mood & affect appropriate.     Data Reviewed: I have personally reviewed following labs and imaging studies  CBC: Recent Labs  Lab 01/13/19 2056 01/14/19 0514 01/15/19 0258 01/16/19 0303  WBC 3.9* 3.0* 4.9 4.8  NEUTROABS 2.5 2.3 3.9 3.8  HGB 12.8 12.5 12.2 11.7*  HCT 38.3 38.5 37.9 34.6*  MCV 87.8 88.9 88.3 84.4  PLT 263 271 288 [id number]   Basic Metabolic Panel: Recent Labs  Lab 01/13/19 2056 01/14/19 0514 01/15/19 0258 01/16/19 0303  NA 136 136 138 138  K 3.7 4.4 4.6 4.4  CL 105 106 106 106  CO2 20* 22 24 25   GLUCOSE 98 133* 133* 134*  BUN 11 12 17 17   CREATININE 0.73 0.64 0.67 0.54  CALCIUM 8.4* 8.6* 8.7* 8.5*   GFR: Estimated Creatinine Clearance: 100.9 mL/min (by C-G formula based on SCr of 0.54  mg/dL). Liver Function Tests: Recent Labs  Lab 01/13/19 2056 01/14/19 0514 01/15/19 0258 01/16/19 0303  AST 28 28 22 22   ALT 19 20 19 17   ALKPHOS 83 82 86 81  BILITOT 0.4 0.3 0.4 0.6  PROT 7.5 7.7 7.4 7.1  ALBUMIN 3.7 3.6 3.4* 3.4*   No results for input(s): LIPASE, AMYLASE in the last 168 hours. No results for input(s): AMMONIA in the last 168 hours. Coagulation Profile: No results for input(s): INR, PROTIME in the last 168 hours. Cardiac Enzymes: Recent Labs  Lab 01/14/19 0514  CKTOTAL 100   BNP (last  3 results) No results for input(s): PROBNP in the last 8760 hours. HbA1C: No results for input(s): HGBA1C in the last 72 hours. CBG: No results for input(s): GLUCAP in the last 168 hours. Lipid Profile: No results for input(s): CHOL, HDL, LDLCALC, TRIG, CHOLHDL, LDLDIRECT in the last 72 hours. Thyroid Function Tests: No results for input(s): TSH, T4TOTAL, FREET4, T3FREE, THYROIDAB in the last 72 hours. Anemia Panel: Recent Labs    01/13/19 2056  FERRITIN 14   Sepsis Labs: No results for input(s): PROCALCITON, LATICACIDVEN in the last 168 hours.  Recent Results (from the past 240 hour(s))  Novel Coronavirus, NAA (Hosp order, Send-out to Ref Lab; TAT 18-24 hrs     Status: Abnormal   Collection Time: 01/10/19  4:13 PM   Specimen: Nasopharyngeal Swab; Respiratory  Result Value Ref Range Status   SARS-CoV-2, NAA DETECTED (A) NOT DETECTED Final    Comment: (NOTE)                  Client Requested Flag This nucleic acid amplification test was developed and its performance characteristics determined by [employer]. Nucleic acid amplification tests include PCR and TMA. This test has not been FDA cleared or approved. This test has been authorized by FDA under an Emergency Use Authorization (EUA). This test is only authorized for the duration of time the declaration that circumstances exist justifying the authorization of the emergency use of in vitro diagnostic tests for detection of SARS-CoV-2 virus and/or diagnosis of COVID-19 infection under section 564(b)(1) of the Act, 21 U.S.C. [id number]) (1), unless the authorization is terminated or revoked sooner. When diagnostic testing is negative, the possibility of a false negative result should be considered in the context of a patient's recent exposures and the presence of clinical signs and symptoms consistent with COVID-19. An individual without symptoms of COVID-  19 and who is not shedding SARS-CoV-2 virus would expect to  have a negative (not detected) result in this assay. Performed At: [facility] [address] [place], [place] [id number] Rush Farmer MD [id number]    [place]  Final    Comment: Performed at [facility], [place] [address], [place], [place] [address]         Radiology Studies: DG Abd 1 View  Result Date: 01/15/2019 CLINICAL DATA:  Onset abdominal pain this morning in a patient who is COVID-19 positive. EXAM: ABDOMEN - 1 VIEW COMPARISON:  Single-view of the abdomen 08/11/2004. FINDINGS: The bowel gas pattern is nonobstructive. There is a large volume of stool in the ascending and transverse colon. No abnormal abdominal calcification is seen. IUD is in place. IMPRESSION: No acute finding. Large volume of stool ascending and transverse colon. Electronically Signed   By: Inge Rise M.D.   On: 01/15/2019 13:52        Scheduled Meds: . albuterol  2 puff Inhalation Q6H  .  vitamin C  500 mg Oral Daily  . dexamethasone (DECADRON) injection  6 mg Intravenous QHS  . enoxaparin (LOVENOX) injection  40 mg Subcutaneous Q24H  . hyoscyamine  0.125 mg Oral TID  . linaclotide  145 mcg Oral QAC breakfast  . montelukast  10 mg Oral Daily  . multivitamin with minerals  1 tablet Oral Daily  . pantoprazole  40 mg Oral Daily  . polyethylene glycol  17 g Oral Daily  . psyllium  1 packet Oral Daily  . senna-docusate  1 tablet Oral BID  . Vitamin D (Ergocalciferol)  50,000 Units Oral Weekly  . zinc sulfate  220 mg Oral Daily   Continuous Infusions: . remdesivir 100 mg in NS 100 mL 100 mg (01/16/19 1122)     LOS: 2 days    Time spent: 35 minutes    Sidney Ace, MD [facility] Pager [phone]  If 7PM-7AM, please contact night-coverage [email or web address] Password [email or web address] 01/16/2019, 3:59 PM

## 2019-01-16 NOTE — Progress Notes (Signed)
Patient was not feeling well and wanted to wait a few to take senna and miralax but then had a BM and refused to take either medication. I notified Dr. Priscella Mann that she refused her medications. No new orders at this time. Will continue to monitor.

## 2019-01-17 LAB — CBC WITH DIFFERENTIAL/PLATELET
Abs Immature Granulocytes: 0.02 10*3/uL (ref 0.00–0.07)
Basophils Absolute: 0 10*3/uL (ref 0.0–0.1)
Basophils Relative: 0 %
Eosinophils Absolute: 0 10*3/uL (ref 0.0–0.5)
Eosinophils Relative: 0 %
HCT: 34.3 % — ABNORMAL LOW (ref 36.0–46.0)
Hemoglobin: 11.7 g/dL — ABNORMAL LOW (ref 12.0–15.0)
Immature Granulocytes: 0 %
Lymphocytes Relative: 19 %
Lymphs Abs: 0.9 10*3/uL (ref 0.7–4.0)
MCH: 28.8 pg (ref 26.0–34.0)
MCHC: 34.1 g/dL (ref 30.0–36.0)
MCV: 84.5 fL (ref 80.0–100.0)
Monocytes Absolute: 0.2 10*3/uL (ref 0.1–1.0)
Monocytes Relative: 5 %
Neutro Abs: 3.4 10*3/uL (ref 1.7–7.7)
Neutrophils Relative %: 76 %
Platelets: 299 10*3/uL (ref 150–400)
RBC: 4.06 MIL/uL (ref 3.87–5.11)
RDW: 14.4 % (ref 11.5–15.5)
WBC: 4.5 10*3/uL (ref 4.0–10.5)
nRBC: 0 % (ref 0.0–0.2)

## 2019-01-17 LAB — COMPREHENSIVE METABOLIC PANEL
ALT: 24 U/L (ref 0–44)
AST: 26 U/L (ref 15–41)
Albumin: 3.4 g/dL — ABNORMAL LOW (ref 3.5–5.0)
Alkaline Phosphatase: 79 U/L (ref 38–126)
Anion gap: 10 (ref 5–15)
BUN: 14 mg/dL (ref 6–20)
CO2: 23 mmol/L (ref 22–32)
Calcium: 8.5 mg/dL — ABNORMAL LOW (ref 8.9–10.3)
Chloride: 106 mmol/L (ref 98–111)
Creatinine, Ser: 0.46 mg/dL (ref 0.44–1.00)
GFR calc Af Amer: >60 mL/min (ref >60)
GFR calc non Af Amer: >60 mL/min (ref >60)
Glucose, Bld: 120 mg/dL — ABNORMAL HIGH (ref 70–99)
Potassium: 4.2 mmol/L (ref 3.5–5.1)
Sodium: 139 mmol/L (ref 135–145)
Total Bilirubin: 0.5 mg/dL (ref 0.3–1.2)
Total Protein: 6.9 g/dL (ref 6.5–8.1)

## 2019-01-17 LAB — C-REACTIVE PROTEIN: CRP: 0.7 mg/dL (ref <1.0)

## 2019-01-17 LAB — FIBRIN DERIVATIVES D-DIMER (ARMC ONLY): Fibrin derivatives D-dimer (ARMC): 644.15 ng/mL (FEU) — ABNORMAL HIGH (ref 0.00–499.00)

## 2019-01-17 NOTE — Progress Notes (Signed)
Patient refused her miralax, senekot and her psyillium this AM. She was only interested at taking her linzess with AM med pass. Reported intermittant nausea during her IV remdesivir infusion, which subsided after discontinuation of IV gtt. She is currently ambulating between bed and chair on RA. Denies all SOB during activity at this time. She wishes to take a shower later today. Towels and toiletries provided. Okay to take off monitor per Dr Louanne Belton.

## 2019-01-17 NOTE — Progress Notes (Signed)
PROGRESS NOTE    Debbie Sosa  [id number] DOB: 1975/11/22 DOA: 01/13/2019 PCP: Elgie Collard, MD    Brief Narrative: Currently requiring 2 L of supplemental oxygen via nasal cannula. Saying having some abdominal pain and has not had a BM in a couple of days. Denies any fever.   12/31: Patient off oxygen.  2 large BM over interval.  Still endorsing significant fatigue   Assessment & Plan:   Active Problems:   Hypoxia   Pneumonia due to COVID-19 virus   Personal history of pulmonary embolism   Bilateral leg pain  Pneumonia due to COVID-19 virus with hypoxia:  --Cont IV dexamethasone, remdesivir per orders as well as multivitamins (day 4) --Oxygen to keep sats over 90% -Bronchodilators -Montelukast and fluticasone home meds --Follow inflammatory biomarkers  Bilateral leg pain/myalgia --Negative bilateral lower extremity venous Dopplers --CPK 123XX123  --Etiology uncertain but patient does have a history of PE but no evidence of DVT at this time - Continue home pain medicine  Abdominal Pain Chronic constipation On 12/9 CT-Ab/Pelvis shows no acute findings  - Pt is S/P gastric bypass  - May obtain a KUB --shows no acute findings with large volume of stools - Had 1 BM 12/30 - Had 1 large BM 12/31 - DC scheduled bowel regimen   DVT prophylaxis:lovenox  Code Status:full Family Communication:none Disposition Plan:Back to previous home environment Consults called:none .   Consultants:   none  Procedures: none  Antimicrobials: none   Subjective: Seen and examined 1 BM this morning Still with significant fatigue  Objective: Vitals:   01/16/19 2037 01/17/19 0542 01/17/19 0815 01/17/19 1309  BP: 102/63 93/62  106/74  Pulse: 94 70  74  Resp: 18 18  (!) 22  Temp: 98.2 F (36.8 C) 98.7 F (37.1 C)  98.6 F (37 C)  TempSrc: Oral Oral  Oral  SpO2: 98% 98% 98% 97%  Weight:      Height:       No intake or output data in the 24 hours  ending 01/17/19 1335 Filed Weights   01/13/19 2107  Weight: 90.7 kg    Examination:  General exam: Appears calm and comfortable  Respiratory system: Clear to auscultation. Respiratory effort normal. Cardiovascular system: S1 & S2 heard, RRR. No JVD, murmurs, rubs, gallops or clicks. No pedal edema. Gastrointestinal system: Abdomen is nondistended, soft and nontender. No organomegaly or masses felt. Normal bowel sounds heard. Central nervous system: Alert and oriented. No focal neurological deficits. Extremities: Symmetric 5 x 5 power. Skin: No rashes, lesions or ulcers Psychiatry: Judgement and insight appear normal. Mood & affect appropriate.     Data Reviewed: I have personally reviewed following labs and imaging studies  CBC: Recent Labs  Lab 01/13/19 2056 01/14/19 0514 01/15/19 0258 01/16/19 0303 01/17/19 0604  WBC 3.9* 3.0* 4.9 4.8 4.5  NEUTROABS 2.5 2.3 3.9 3.8 3.4  HGB 12.8 12.5 12.2 11.7* 11.7*  HCT 38.3 38.5 37.9 34.6* 34.3*  MCV 87.8 88.9 88.3 84.4 84.5  PLT 263 271 288 302 [id number]   Basic Metabolic Panel: Recent Labs  Lab 01/13/19 2056 01/14/19 0514 01/15/19 0258 01/16/19 0303 01/17/19 0604  NA 136 136 138 138 139  K 3.7 4.4 4.6 4.4 4.2  CL 105 106 106 106 106  CO2 20* 22 24 25 23   GLUCOSE 98 133* 133* 134* 120*  BUN 11 12 17 17 14   CREATININE 0.73 0.64 0.67 0.54 0.46  CALCIUM 8.4* 8.6* 8.7* 8.5* 8.5*   GFR: Estimated  Creatinine Clearance: 100.9 mL/min (by C-G formula based on SCr of 0.46 mg/dL). Liver Function Tests: Recent Labs  Lab 01/13/19 2056 01/14/19 0514 01/15/19 0258 01/16/19 0303 01/17/19 0604  AST 28 28 22 22 26   ALT 19 20 19 17 24   ALKPHOS 83 82 86 81 79  BILITOT 0.4 0.3 0.4 0.6 0.5  PROT 7.5 7.7 7.4 7.1 6.9  ALBUMIN 3.7 3.6 3.4* 3.4* 3.4*   No results for input(s): LIPASE, AMYLASE in the last 168 hours. No results for input(s): AMMONIA in the last 168 hours. Coagulation Profile: No results for input(s): INR, PROTIME in the  last 168 hours. Cardiac Enzymes: Recent Labs  Lab 01/14/19 0514  CKTOTAL 100   BNP (last 3 results) No results for input(s): PROBNP in the last 8760 hours. HbA1C: No results for input(s): HGBA1C in the last 72 hours. CBG: No results for input(s): GLUCAP in the last 168 hours. Lipid Profile: No results for input(s): CHOL, HDL, LDLCALC, TRIG, CHOLHDL, LDLDIRECT in the last 72 hours. Thyroid Function Tests: No results for input(s): TSH, T4TOTAL, FREET4, T3FREE, THYROIDAB in the last 72 hours. Anemia Panel: No results for input(s): VITAMINB12, FOLATE, FERRITIN, TIBC, IRON, RETICCTPCT in the last 72 hours. Sepsis Labs: No results for input(s): PROCALCITON, LATICACIDVEN in the last 168 hours.  Recent Results (from the past 240 hour(s))  Novel Coronavirus, NAA (Hosp order, Send-out to Ref Lab; TAT 18-24 hrs     Status: Abnormal   Collection Time: 01/10/19  4:13 PM   Specimen: Nasopharyngeal Swab; Respiratory  Result Value Ref Range Status   SARS-CoV-2, NAA DETECTED (A) NOT DETECTED Final    Comment: (NOTE)                  Client Requested Flag This nucleic acid amplification test was developed and its performance characteristics determined by [employer]. Nucleic acid amplification tests include PCR and TMA. This test has not been FDA cleared or approved. This test has been authorized by FDA under an Emergency Use Authorization (EUA). This test is only authorized for the duration of time the declaration that circumstances exist justifying the authorization of the emergency use of in vitro diagnostic tests for detection of SARS-CoV-2 virus and/or diagnosis of COVID-19 infection under section 564(b)(1) of the Act, 21 U.S.C. [id number]) (1), unless the authorization is terminated or revoked sooner. When diagnostic testing is negative, the possibility of a false negative result should be considered in the context of a patient's recent exposures and the presence of clinical  signs and symptoms consistent with COVID-19. An individual without symptoms of COVID-  19 and who is not shedding SARS-CoV-2 virus would expect to have a negative (not detected) result in this assay. Performed At: [facility] [address] [place], [place] [id number] Rush Farmer MD [id number]    [place]  Final    Comment: Performed at [facility], [employer] [address] [place], [place] [address]         Radiology Studies: DG Abd 1 View  Result Date: 01/15/2019 CLINICAL DATA:  Onset abdominal pain this morning in a patient who is COVID-19 positive. EXAM: ABDOMEN - 1 VIEW COMPARISON:  Single-view of the abdomen 08/11/2004. FINDINGS: The bowel gas pattern is nonobstructive. There is a large volume of stool in the ascending and transverse colon. No abnormal abdominal calcification is seen. IUD is in place. IMPRESSION: No acute finding. Large volume of stool ascending and transverse colon. Electronically Signed   By: Inge Rise  M.D.   On: 01/15/2019 13:52        Scheduled Meds: . albuterol  2 puff Inhalation Q6H  . vitamin C  500 mg Oral Daily  . dexamethasone (DECADRON) injection  6 mg Intravenous QHS  . enoxaparin (LOVENOX) injection  40 mg Subcutaneous Q24H  . hyoscyamine  0.125 mg Oral TID  . linaclotide  145 mcg Oral QAC breakfast  . montelukast  10 mg Oral Daily  . multivitamin with minerals  1 tablet Oral Daily  . pantoprazole  40 mg Oral Daily  . polyethylene glycol  17 g Oral Daily  . psyllium  1 packet Oral Daily  . senna-docusate  1 tablet Oral BID  . Vitamin D (Ergocalciferol)  50,000 Units Oral Weekly  . zinc sulfate  220 mg Oral Daily   Continuous Infusions:    LOS: 3 days    Time spent: 35 minutes    Sidney Ace, MD [facility] Pager [phone]  If 7PM-7AM, please contact night-coverage [email or web address] Password [email or web address] 01/17/2019, 1:35 PM

## 2019-01-18 LAB — CBC WITH DIFFERENTIAL/PLATELET
Abs Immature Granulocytes: 0.05 10*3/uL (ref 0.00–0.07)
Basophils Absolute: 0 10*3/uL (ref 0.0–0.1)
Basophils Relative: 0 %
Eosinophils Absolute: 0 10*3/uL (ref 0.0–0.5)
Eosinophils Relative: 0 %
HCT: 33.1 % — ABNORMAL LOW (ref 36.0–46.0)
Hemoglobin: 11.2 g/dL — ABNORMAL LOW (ref 12.0–15.0)
Immature Granulocytes: 1 %
Lymphocytes Relative: 15 %
Lymphs Abs: 0.9 10*3/uL (ref 0.7–4.0)
MCH: 28.4 pg (ref 26.0–34.0)
MCHC: 33.8 g/dL (ref 30.0–36.0)
MCV: 83.8 fL (ref 80.0–100.0)
Monocytes Absolute: 0.3 10*3/uL (ref 0.1–1.0)
Monocytes Relative: 4 %
Neutro Abs: 4.9 10*3/uL (ref 1.7–7.7)
Neutrophils Relative %: 80 %
Platelets: 330 10*3/uL (ref 150–400)
RBC: 3.95 MIL/uL (ref 3.87–5.11)
RDW: 14.3 % (ref 11.5–15.5)
WBC: 6.2 10*3/uL (ref 4.0–10.5)
nRBC: 0 % (ref 0.0–0.2)

## 2019-01-18 LAB — FIBRIN DERIVATIVES D-DIMER (ARMC ONLY): Fibrin derivatives D-dimer (ARMC): 578.74 ng/mL (FEU) — ABNORMAL HIGH (ref 0.00–499.00)

## 2019-01-18 LAB — C-REACTIVE PROTEIN: CRP: 0.6 mg/dL (ref <1.0)

## 2019-01-18 MED ORDER — DEXAMETHASONE 6 MG PO TABS: 6.0000 mg | ORAL_TABLET | Freq: Every day | ORAL | 0 refills | Status: AC

## 2019-01-18 MED ORDER — ASCORBIC ACID 500 MG PO TABS: 500.0000 mg | ORAL_TABLET | Freq: Every day | ORAL | 0 refills | Status: AC

## 2019-01-18 MED ORDER — ZINC SULFATE 220 (50 ZN) MG PO CAPS: 220.0000 mg | ORAL_CAPSULE | Freq: Every day | ORAL | 0 refills | Status: AC

## 2019-01-18 NOTE — Progress Notes (Signed)
PT Cancellation Note  Patient Details Name: Debbie Sosa MRN: [id number] DOB: June 19, 1975   Cancelled Treatment:    Reason Eval/Treat Not Completed: PT screened, no needs identified, will sign off, discussed patients mobility status with nsg and no needs were identified for skilled PT evaluation.    [address], [place], [place] DPT 01/18/2019, 11:12 AM

## 2019-01-18 NOTE — Progress Notes (Signed)
Discharge instructions and medication changes have been reviewed with patient.  All questions answered.  Plan to have family member pick patient up when ready.  All belongings returned and IV removed.

## 2019-01-18 NOTE — Discharge Summary (Signed)
Physician Discharge Summary  Debbie Sosa [id number] DOB: 1975-04-20 DOA: 01/13/2019  PCP: Elgie Collard, MD  Admit date: 01/13/2019 Discharge date: 01/18/2019  Admitted From: Home Disposition: Home  Recommendations for Outpatient Follow-up:  1. Follow up with PCP in 1-2 weeks 2.   Home Health: No Equipment/Devices: None Discharge Condition: Stable CODE STATUS: Full Diet recommendation: Regular  Brief/Interim Summary: Debbie Sosa is a 44 y.o. female with medical history significant for with history of obesity status post gastric bypass surgery as well as history of PE related to pregnancy, diagnosed with Covid on 01/10/2019 who presents to the emergency room with weakness, shortness of breath and muscle soreness legs and upper extremities.  Denies nausea vomiting or diarrhea denies fever  Patient responded well to steroid remdesivir therapy.  Oxygen saturation gradually improved.  Patient did have some issues with significant constipation.  KUB was ordered did not demonstrate obstruction but did demonstrate significant stool burden.  Aggressive bowel regimen was initiated with good result.  Once patient was confirmed to be titrated to room air and ambulated without desaturation she is able to be discharged home.  Will complete a 10-day course of steroid therapy as outpatient.   Discharge Diagnoses:  Active Problems:   Hypoxia   Pneumonia due to COVID-19 virus   Personal history of pulmonary embolism   Bilateral leg pain  Pneumonia due to COVID-19 virus with hypoxia:  Completed 5 days of remdesivir. We will complete remainder of steroid therapy at home Inflammatory markers trended down during hospitalization On room air at time of discharge  Bilateral leg pain/myalgia CPK 100 Bilateral lower extremity duplex negative Suspect manifestation of chronic pain Pain continue pain regimen on discharge  Abdominal Pain Chronic constipation On 12/9  CT-Ab/Pelvis shows no acute findings  - Pt is S/P gastric bypass  - May obtain a KUB--shows no acute findings with large volume of stools - Had 1 BM 12/30 - Had 1 large BM 12/31 - DC scheduled bowel regimen  Discharge Instructions  Discharge Instructions    Diet - low sodium heart healthy   Complete by: As directed    Increase activity slowly   Complete by: As directed    MyChart COVID-19 home monitoring program   Complete by: Jan 18, 2019    Is the patient willing to use the [facility] for home monitoring?: Yes   Temperature monitoring   Complete by: Jan 18, 2019    After how many days would you like to receive a notification of this patient's flowsheet entries?: 1     Allergies as of 01/18/2019      Reactions   Cephalexin Hives, Rash   Fexofenadine Shortness Of Breath   Ketorolac Hives   Morphine Anaphylaxis   Phenazopyridine Rash   Promethazine Hives   Cetirizine Other (See Comments)   Skin eruptures   Contrast Media [iodinated Diagnostic Agents] Hives   Latex    Morphine And Related    Other Hives   Pain medicine that starts with an L   Pyridium [phenazopyridine Hcl]    Tramadol Hives   Vioxx [rofecoxib]    Hepatitis B Virus Vaccines Rash   Naproxen Rash   Oxycodone-acetaminophen Itching   Sulfa Antibiotics Rash      Medication List    STOP taking these medications   HYDROcodone-acetaminophen 5-325 MG tablet Commonly known as: NORCO/VICODIN   hyoscyamine 0.125 MG tablet Commonly known as: LEVSIN   mupirocin ointment 2 % Commonly known as: Bactroban  predniSONE 50 MG tablet Commonly known as: DELTASONE   triamcinolone cream 0.1 % Commonly known as: KENALOG     TAKE these medications   albuterol 108 (90 Base) MCG/ACT inhaler Commonly known as: VENTOLIN HFA Inhale 1-2 puffs INH Q4-6hr prn for chest tightness, cough, wheezing, SOB/DOE.   ascorbic acid 500 MG tablet Commonly known as: VITAMIN C Take 1 tablet (500 mg total) by mouth  daily. Start taking on: January 19, 2019   benzonatate 100 MG capsule Commonly known as: TESSALON Take 1-2 capsules (100-200 mg total) by mouth 3 (three) times daily as needed.   dexamethasone 6 MG tablet Commonly known as: Decadron Take 1 tablet (6 mg total) by mouth daily for 4 days. Start taking on: January 19, 2019   diclofenac Sodium 1 % Gel Commonly known as: VOLTAREN Apply 2 g topically 4 (four) times daily.   Diclofenac Sodium 3 % Gel Place onto the skin.   EpiPen 2-Pak 0.3 mg/0.3 mL Soaj injection Generic drug: EPINEPHrine EpiPen 2-Pak 0.3 mg/0.3 mL injection, auto-injector   fluticasone 50 MCG/ACT nasal spray Commonly known as: FLONASE once as needed.   montelukast 10 MG tablet Commonly known as: SINGULAIR Take 10 mg by mouth daily.   Multi-Vitamins Tabs Take by mouth.   omeprazole 40 MG capsule Commonly known as: PRILOSEC Take 1 capsule (40 mg total) by mouth daily.   sucralfate 1 g tablet Commonly known as: CARAFATE Take by mouth.   tobramycin 0.3 % ophthalmic solution Commonly known as: TOBREX instill 1-2 drops into affected eye twice a day   Vitamin D (Ergocalciferol) 1.25 MG (50000 UT) Caps capsule Commonly known as: DRISDOL Take 50,000 Units by mouth once a week.   zinc sulfate 220 (50 Zn) MG capsule Take 1 capsule (220 mg total) by mouth daily. Start taking on: January 19, 2019      Follow-up Information    Elgie Collard, MD. Schedule an appointment as soon as possible for a visit in 1 week(s).   Specialty: Obstetrics and Gynecology Contact information: [place] [address] [phone]          Allergies  Allergen Reactions  . Cephalexin Hives and Rash  . Fexofenadine Shortness Of Breath  . Ketorolac Hives  . Morphine Anaphylaxis  . Phenazopyridine Rash  . Promethazine Hives  . Cetirizine Other (See Comments)    Skin eruptures  . Contrast Media [Iodinated Diagnostic Agents] Hives  .  Latex   . Morphine And Related   . Other Hives    Pain medicine that starts with an L  . Pyridium [Phenazopyridine Hcl]   . Tramadol Hives  . Vioxx [Rofecoxib]   . Hepatitis B Virus Vaccines Rash  . Naproxen Rash  . Oxycodone-Acetaminophen Itching  . Sulfa Antibiotics Rash    Consultations:  none   Procedures/Studies: DG Abd 1 View  Result Date: 01/15/2019 CLINICAL DATA:  Onset abdominal pain this morning in a patient who is COVID-19 positive. EXAM: ABDOMEN - 1 VIEW COMPARISON:  Single-view of the abdomen 08/11/2004. FINDINGS: The bowel gas pattern is nonobstructive. There is a large volume of stool in the ascending and transverse colon. No abnormal abdominal calcification is seen. IUD is in place. IMPRESSION: No acute finding. Large volume of stool ascending and transverse colon. Electronically Signed   By: Inge Rise M.D.   On: 01/15/2019 13:52   CT Abdomen Pelvis W Contrast  Result Date: 12/26/2018 CLINICAL DATA:  Left lower quadrant abdominal pain x2 years  EXAM: CT ABDOMEN AND PELVIS WITH CONTRAST TECHNIQUE: Multidetector CT imaging of the abdomen and pelvis was performed using the standard protocol following bolus administration of intravenous contrast. CONTRAST:  173mL OMNIPAQUE IOHEXOL 300 MG/ML  SOLN COMPARISON:  10/02/2016 FINDINGS: Lower chest: The lung bases are clear. The heart size is normal. Hepatobiliary: The liver is normal. Status post cholecystectomy.There is no biliary ductal dilation. Pancreas: Normal contours without ductal dilatation. No peripancreatic fluid collection. Spleen: No splenic laceration or hematoma. Adrenals/Urinary Tract: --Adrenal glands: No adrenal hemorrhage. --Right kidney/ureter: There is a punctate nonobstructing stone in the interpolar region of the right kidney. --Left kidney/ureter: There is a prominent extrarenal pelvis on the left without evidence for frank hydronephrosis. --Urinary bladder: Unremarkable. Stomach/Bowel:  --Stomach/Duodenum: The patient is status post prior gastric bypass. --Small bowel: No dilatation or inflammation. --Colon: No focal abnormality. --Appendix: Normal. Vascular/Lymphatic: Normal course and caliber of the major abdominal vessels. --No retroperitoneal lymphadenopathy. --No mesenteric lymphadenopathy. --No pelvic or inguinal lymphadenopathy. Reproductive: There is an IUD in place. Other: No ascites or free air. The abdominal wall is normal. Musculoskeletal. No acute displaced fractures. IMPRESSION: 1. No acute abdominopelvic abnormality. 2. Status post gastric bypass without evidence for obstruction. 3. Punctate nonobstructing stone in the interpolar region of the right kidney. Electronically Signed   By: Constance Holster M.D.   On: 12/26/2018 21:07   US Venous Img Lower Bilateral (DVT)  Result Date: 01/13/2019 CLINICAL DATA:  Shortness of breath and bilateral leg pain EXAM: BILATERAL LOWER EXTREMITY VENOUS DOPPLER ULTRASOUND TECHNIQUE: Gray-scale sonography with graded compression, as well as color Doppler and duplex ultrasound were performed to evaluate the lower extremity deep venous systems from the level of the common femoral vein and including the common femoral, femoral, profunda femoral, popliteal and calf veins including the posterior tibial, peroneal and gastrocnemius veins when visible. The superficial great saphenous vein was also interrogated. Spectral Doppler was utilized to evaluate flow at rest and with distal augmentation maneuvers in the common femoral, femoral and popliteal veins. COMPARISON:  None. FINDINGS: RIGHT LOWER EXTREMITY Common Femoral Vein: No evidence of thrombus. Normal compressibility, respiratory phasicity and response to augmentation. Saphenofemoral Junction: No evidence of thrombus. Normal compressibility and flow on color Doppler imaging. Profunda Femoral Vein: No evidence of thrombus. Normal compressibility and flow on color Doppler imaging. Femoral Vein: No  evidence of thrombus. Normal compressibility, respiratory phasicity and response to augmentation. Popliteal Vein: No evidence of thrombus. Normal compressibility, respiratory phasicity and response to augmentation. Calf Veins: No evidence of thrombus. Normal compressibility and flow on color Doppler imaging. Superficial Great Saphenous Vein: No evidence of thrombus. Normal compressibility. Venous Reflux:  None. Other Findings:  None. LEFT LOWER EXTREMITY Common Femoral Vein: No evidence of thrombus. Normal compressibility, respiratory phasicity and response to augmentation. Saphenofemoral Junction: No evidence of thrombus. Normal compressibility and flow on color Doppler imaging. Profunda Femoral Vein: No evidence of thrombus. Normal compressibility and flow on color Doppler imaging. Femoral Vein: No evidence of thrombus. Normal compressibility, respiratory phasicity and response to augmentation. Popliteal Vein: No evidence of thrombus. Normal compressibility, respiratory phasicity and response to augmentation. Calf Veins: No evidence of thrombus. Normal compressibility and flow on color Doppler imaging. Superficial Great Saphenous Vein: No evidence of thrombus. Normal compressibility. Venous Reflux:  None. Other Findings:  None. IMPRESSION: No evidence of deep venous thrombosis in either lower extremity. Electronically Signed   By: Prudencio Pair M.D.   On: 01/13/2019 23:02   DG Chest Port 1 View  Result Date: 01/13/2019  CLINICAL DATA:  Shortness of breath, COVID-19 diagnose 01/10/2019 EXAM: PORTABLE CHEST 1 VIEW COMPARISON:  Radiograph 01/31/2018, chest CTA 10/26/2017 FINDINGS: Patchy opacity in the left lung periphery. More streaky atelectatic opacities are present in the lung bases. No pneumothorax or visible effusion. Cardiomediastinal contours are unremarkable for portable technique. No acute osseous or soft tissue abnormality. IMPRESSION: 1. Patchy opacity in the left lung periphery suspicious for  pneumonia given COVID-19 positivity. 2. More streaky atelectatic opacities in the lung bases. Electronically Signed   By: Lovena Le M.D.   On: 01/13/2019 21:46   MM 3D SCREEN BREAST BILATERAL  Result Date: 12/26/2018 CLINICAL DATA:  Screening. EXAM: DIGITAL SCREENING BILATERAL MAMMOGRAM WITH TOMO AND CAD COMPARISON:  Previous exam(s). ACR Breast Density Category b: There are scattered areas of fibroglandular density. FINDINGS: There are no findings suspicious for malignancy. Images were processed with CAD. IMPRESSION: No mammographic evidence of malignancy. A result letter of this screening mammogram will be mailed directly to the patient. RECOMMENDATION: Screening mammogram in one year. (Code:SM-B-01Y) BI-RADS CATEGORY  1: Negative. Electronically Signed   By: Margarette Canada M.D.   On: 12/26/2018 16:21      Subjective:   Discharge Exam: Vitals:   01/18/19 0641 01/18/19 0740  BP: 96/60   Pulse: 64   Resp: 18   Temp: 98.6 F (37 C)   SpO2: 98% 98%   Vitals:   01/17/19 1309 01/17/19 2046 01/18/19 0641 01/18/19 0740  BP: 106/74 104/69 96/60   Pulse: 74 75 64   Resp: (!) 22 20 18    Temp: 98.6 F (37 C) 98.4 F (36.9 C) 98.6 F (37 C)   TempSrc: Oral Oral Oral   SpO2: 97% 98% 98% 98%  Weight:      Height:        General: Pt is alert, awake, not in acute distress Cardiovascular: RRR, S1/S2 +, no rubs, no gallops Respiratory: CTA bilaterally, no wheezing, no rhonchi Abdominal: Soft, NT, ND, bowel sounds + Extremities: no edema, no cyanosis    The results of significant diagnostics from this hospitalization (including imaging, microbiology, ancillary and laboratory) are listed below for reference.     Microbiology: Recent Results (from the past 240 hour(s))  Novel Coronavirus, NAA (Hosp order, Send-out to [facility]; TAT 18-24 hrs     Status: Abnormal   Collection Time: 01/10/19  4:13 PM   Specimen: Nasopharyngeal Swab; Respiratory  Result Value Ref Range Status    SARS-CoV-2, NAA DETECTED (A) NOT DETECTED Final    Comment: (NOTE)                  Client Requested Flag This nucleic acid amplification test was developed and its performance characteristics determined by [employer]. Nucleic acid amplification tests include PCR and TMA. This test has not been FDA cleared or approved. This test has been authorized by FDA under an Emergency Use Authorization (EUA). This test is only authorized for the duration of time the declaration that circumstances exist justifying the authorization of the emergency use of in vitro diagnostic tests for detection of SARS-CoV-2 virus and/or diagnosis of COVID-19 infection under section 564(b)(1) of the Act, 21 U.S.C. [id number]) (1), unless the authorization is terminated or revoked sooner. When diagnostic testing is negative, the possibility of a false negative result should be considered in the context of a patient's recent exposures and the presence of clinical signs and symptoms consistent with COVID-19. An individual without symptoms of COVID-  19 and who is not  shedding SARS-CoV-2 virus would expect to have a negative (not detected) result in this assay. Performed At: [facility] [address] [place], [place] [id number] Rush Farmer MD [id number]    [place]  Final    Comment: Performed at [facility], [place] [address] [place], [place] [address]     Labs: BNP (last 3 results) Recent Labs    01/31/18 2126  BNP 0000000   Basic Metabolic Panel: Recent Labs  Lab 01/13/19 2056 01/14/19 0514 01/15/19 0258 01/16/19 0303 01/17/19 0604  NA 136 136 138 138 139  K 3.7 4.4 4.6 4.4 4.2  CL 105 106 106 106 106  CO2 20* 22 24 25 23   GLUCOSE 98 133* 133* 134* 120*  BUN 11 12 17 17 14   CREATININE 0.73 0.64 0.67 0.54 0.46  CALCIUM 8.4* 8.6* 8.7* 8.5* 8.5*   Liver Function Tests: Recent Labs  Lab 01/13/19 2056 01/14/19 0514 01/15/19 0258  01/16/19 0303 01/17/19 0604  AST 28 28 22 22 26   ALT 19 20 19 17 24   ALKPHOS 83 82 86 81 79  BILITOT 0.4 0.3 0.4 0.6 0.5  PROT 7.5 7.7 7.4 7.1 6.9  ALBUMIN 3.7 3.6 3.4* 3.4* 3.4*   No results for input(s): LIPASE, AMYLASE in the last 168 hours. No results for input(s): AMMONIA in the last 168 hours. CBC: Recent Labs  Lab 01/14/19 0514 01/15/19 0258 01/16/19 0303 01/17/19 0604 01/18/19 0322  WBC 3.0* 4.9 4.8 4.5 6.2  NEUTROABS 2.3 3.9 3.8 3.4 4.9  HGB 12.5 12.2 11.7* 11.7* 11.2*  HCT 38.5 37.9 34.6* 34.3* 33.1*  MCV 88.9 88.3 84.4 84.5 83.8  PLT 271 288 302 299 330   Cardiac Enzymes: Recent Labs  Lab 01/14/19 0514  CKTOTAL 100   BNP: Invalid input(s): POCBNP CBG: No results for input(s): GLUCAP in the last 168 hours. D-Dimer No results for input(s): DDIMER in the last 72 hours. Hgb A1c No results for input(s): HGBA1C in the last 72 hours. Lipid Profile No results for input(s): CHOL, HDL, LDLCALC, TRIG, CHOLHDL, LDLDIRECT in the last 72 hours. Thyroid function studies No results for input(s): TSH, T4TOTAL, T3FREE, THYROIDAB in the last 72 hours.  Invalid input(s): FREET3 Anemia work up No results for input(s): VITAMINB12, FOLATE, FERRITIN, TIBC, IRON, RETICCTPCT in the last 72 hours. Urinalysis    Component Value Date/Time   COLORURINE YELLOW (A) 10/02/2016 1710   APPEARANCEUR HAZY (A) 10/02/2016 1710   APPEARANCEUR Turbid 08/23/2013 1720   LABSPEC 1.018 10/02/2016 1710   LABSPEC 1.008 08/23/2013 1720   PHURINE 6.0 10/02/2016 1710   GLUCOSEU 50 (A) 10/02/2016 1710   GLUCOSEU Negative 08/23/2013 1720   HGBUR SMALL (A) 10/02/2016 1710   BILIRUBINUR NEGATIVE 10/02/2016 1710   BILIRUBINUR Negative 08/23/2013 1720   KETONESUR 5 (A) 10/02/2016 1710   PROTEINUR NEGATIVE 10/02/2016 1710   NITRITE NEGATIVE 10/02/2016 1710   LEUKOCYTESUR NEGATIVE 10/02/2016 1710   LEUKOCYTESUR 3+ 08/23/2013 1720   Sepsis Labs Invalid input(s): PROCALCITONIN,  WBC,   LACTICIDVEN Microbiology Recent Results (from the past 240 hour(s))  Novel Coronavirus, NAA (Hosp order, Send-out to [facility]; TAT 18-24 hrs     Status: Abnormal   Collection Time: 01/10/19  4:13 PM   Specimen: Nasopharyngeal Swab; Respiratory  Result Value Ref Range Status   SARS-CoV-2, NAA DETECTED (A) NOT DETECTED Final    Comment: (NOTE)                  Client Requested Flag  This nucleic acid amplification test was developed and its performance characteristics determined by [employer]. Nucleic acid amplification tests include PCR and TMA. This test has not been FDA cleared or approved. This test has been authorized by FDA under an Emergency Use Authorization (EUA). This test is only authorized for the duration of time the declaration that circumstances exist justifying the authorization of the emergency use of in vitro diagnostic tests for detection of SARS-CoV-2 virus and/or diagnosis of COVID-19 infection under section 564(b)(1) of the Act, 21 U.S.C. [id number]) (1), unless the authorization is terminated or revoked sooner. When diagnostic testing is negative, the possibility of a false negative result should be considered in the context of a patient's recent exposures and the presence of clinical signs and symptoms consistent with COVID-19. An individual without symptoms of COVID-  19 and who is not shedding SARS-CoV-2 virus would expect to have a negative (not detected) result in this assay. Performed At: [facility] [address] [place], [place] [id number] Rush Farmer MD [id number]    [place]  Final    Comment: Performed at [facility], [place] [address], [place], [place] [address]     Time coordinating discharge: Over 30 minutes  SIGNED:   Sidney Ace, MD  [facility] 01/18/2019, 4:15 PM Pager [phone]  If 7PM-7AM, please contact night-coverage [email or web address] Password [email or web address]

## 2019-01-23 ENCOUNTER — Ambulatory Visit: Payer: BC Managed Care – PPO | Attending: Internal Medicine

## 2019-01-23 DIAGNOSIS — Z20822 Contact with and (suspected) exposure to covid-19: Secondary | ICD-10-CM | POA: Insufficient documentation

## 2019-01-23 NOTE — Telephone Encounter (Signed)
Called Debbie Sosa and informed her of Dr. Georgeann Oppenheim suggestion regarding the Linzess and Jody's suggestion regarding the bill. Debbie Sosa agrees to pick up samples and plans to contact the billing dept.

## 2019-01-23 NOTE — Telephone Encounter (Signed)
Advise to try Linzess 145 mcg, give samples. If does not work in a week to call back to increase dose further. Defer to [facility] regarding billing

## 2019-01-25 LAB — NOVEL CORONAVIRUS, NAA: SARS-CoV-2, NAA: NOT DETECTED

## 2019-01-27 DIAGNOSIS — K5900 Constipation, unspecified: Secondary | ICD-10-CM | POA: Insufficient documentation

## 2019-01-27 DIAGNOSIS — B029 Zoster without complications: Secondary | ICD-10-CM | POA: Insufficient documentation

## 2019-01-31 ENCOUNTER — Ambulatory Visit: Payer: BC Managed Care – PPO | Attending: Internal Medicine

## 2019-01-31 DIAGNOSIS — Z20822 Contact with and (suspected) exposure to covid-19: Secondary | ICD-10-CM

## 2019-02-02 LAB — NOVEL CORONAVIRUS, NAA: SARS-CoV-2, NAA: NOT DETECTED

## 2019-02-13 ENCOUNTER — Ambulatory Visit: Payer: BC Managed Care – PPO | Admitting: Gastroenterology

## 2019-02-13 ENCOUNTER — Other Ambulatory Visit: Payer: Self-pay

## 2019-02-13 VITALS — BP 105/76 | HR 74 | Temp 98.5°F | Ht 62.0 in | Wt 201.6 lb

## 2019-02-13 DIAGNOSIS — K5900 Constipation, unspecified: Secondary | ICD-10-CM | POA: Diagnosis not present

## 2019-02-13 NOTE — Progress Notes (Signed)
[name] MD, MRCP(U.K) [address]  [place]  [place], [place] [address]  Main: [phone]  Fax: [phone]   Primary Care Physician: Elgie Collard, MD  Primary Gastroenterologist:  Dr. [name]   Follow-up for abdominal pain  HPI: Debbie Sosa is a 44 y.o. female    Summary of history :  She is being followed for left upper quadrant pain.  She has a history of diverticulitis in 2018.  History of gastric bypass.  The abdominal painOccurred every few days. Pain could last for hours and at times few days. Left side of the abdomen localized, worse with usually within 15 minutes. Relieved by laying down. No NSAID use. Relieved with a bowel movement. Lots of bloating.  I have previously felt that her pain could also be from constipation.  MiraLAX failed.  Family history of colon polyps.  03/2017: EGD- normal gastric by pass anatomy .Biopsies of stomach showed atrophic gastritis and a repeat EGD in 3 years 09/05/2016: H. pylori stool antigen negative 10/31/2018: EGD: Normal-appearing Roux-en-Y gastric bypass.  Gastric biopsies showed intestinal metaplasia.  Colonoscopy: 3 hyperplastic polyps were resected.  She clearly gives a history of no abdominal pain after her colonoscopy because she was completely cleaned out.  She did have abdominal pain prior to the cleanout for her colonoscopy.  Interval history  11/22/2018-02/13/2019  12/26/2018: CT scan of the abdomen and pelvis with contrast no obstruction 01/15/2019: Abdominal x-ray for abdominal pain showed large volume of stool in ascending and transverse colon  Admitted recently with Covid pneumonia 11/23/2018: H. pylori stool antigen negative  She was given Linzess 290 mcg samples she said she did not have much of a result after that.  She is seen at urgent care and she was told to take a probiotic and she states that since then she has been having regular bowel movements. Current Outpatient  Medications  Medication Sig Dispense Refill  . albuterol (VENTOLIN HFA) 108 (90 Base) MCG/ACT inhaler Inhale 1-2 puffs INH Q4-6hr prn for chest tightness, cough, wheezing, SOB/DOE. 18 g 0  . ascorbic acid (VITAMIN C) 500 MG tablet Take 1 tablet (500 mg total) by mouth daily. 30 tablet 0  . benzonatate (TESSALON) 100 MG capsule Take 1-2 capsules (100-200 mg total) by mouth 3 (three) times daily as needed. 60 capsule 0  . diclofenac Sodium (VOLTAREN) 1 % GEL Apply 2 g topically 4 (four) times daily.    . Diclofenac Sodium 3 % GEL Place onto the skin.    Marland Kitchen EPINEPHrine (EPIPEN 2-PAK) 0.3 mg/0.3 mL IJ SOAJ injection EpiPen 2-Pak 0.3 mg/0.3 mL injection, auto-injector    . fluticasone (FLONASE) 50 MCG/ACT nasal spray once as needed.     . montelukast (SINGULAIR) 10 MG tablet Take 10 mg by mouth daily.     . Multiple Vitamin (MULTI-VITAMINS) TABS Take by mouth.    Marland Kitchen omeprazole (PRILOSEC) 40 MG capsule Take 1 capsule (40 mg total) by mouth daily. 90 capsule 1  . sucralfate (CARAFATE) 1 g tablet Take by mouth.    . tobramycin (TOBREX) 0.3 % ophthalmic solution instill 1-2 drops into affected eye twice a day  0  . Vitamin D, Ergocalciferol, (DRISDOL) 1.25 MG (50000 UT) CAPS capsule Take 50,000 Units by mouth once a week.    . zinc sulfate 220 (50 Zn) MG capsule Take 1 capsule (220 mg total) by mouth daily. 30 capsule 0   No current facility-administered medications for this visit.    Allergies as  of 02/13/2019 - Review Complete 01/14/2019  Allergen Reaction Noted  . Cephalexin Hives and Rash 03/12/2014  . Fexofenadine Shortness Of Breath   . Ketorolac Hives 03/12/2014  . Morphine Anaphylaxis 07/01/2013  . Phenazopyridine Rash   . Promethazine Hives 03/12/2014  . Cetirizine Other (See Comments) 03/05/2018  . Contrast media [iodinated diagnostic agents] Hives 05/23/2016  . Latex  06/18/2014  . Morphine and related  06/18/2014  . Other Hives 02/23/2013  . Pyridium [phenazopyridine hcl]   06/18/2014  . Tramadol Hives 06/18/2014  . Vioxx [rofecoxib]  06/18/2014  . Hepatitis b virus vaccines Rash 07/01/2013  . Naproxen Rash 07/01/2013  . Oxycodone-acetaminophen Itching 03/12/2014  . Sulfa antibiotics Rash 06/18/2014    ROS:  General: Negative for anorexia, weight loss, fever, chills, fatigue, weakness. ENT: Negative for hoarseness, difficulty swallowing , nasal congestion. CV: Negative for chest pain, angina, palpitations, dyspnea on exertion, peripheral edema.  Respiratory: Negative for dyspnea at rest, dyspnea on exertion, cough, sputum, wheezing.  GI: See history of present illness. GU:  Negative for dysuria, hematuria, urinary incontinence, urinary frequency, nocturnal urination.  Endo: Negative for unusual weight change.    Physical Examination:   There were no vitals taken for this visit.  General: Well-nourished, well-developed in no acute distress.  Eyes: No icterus. Conjunctivae pink. Mouth: Oropharyngeal mucosa moist and pink , no lesions erythema or exudate. Lungs: Clear to auscultation bilaterally. Non-labored. Heart: Regular rate and rhythm, no murmurs rubs or gallops.  Abdomen: Bowel sounds are normal, nontender, nondistended, no hepatosplenomegaly or masses, no abdominal bruits or hernia , no rebound or guarding.   Extremities: No lower extremity edema. No clubbing or deformities. Neuro: Alert and oriented x 3.  Grossly intact. Skin: Warm and dry, no jaundice.   Psych: Alert and cooperative, normal mood and affect.   Imaging Studies: DG Abd 1 View  Result Date: 01/15/2019 CLINICAL DATA:  Onset abdominal pain this morning in a patient who is COVID-19 positive. EXAM: ABDOMEN - 1 VIEW COMPARISON:  Single-view of the abdomen 08/11/2004. FINDINGS: The bowel gas pattern is nonobstructive. There is a large volume of stool in the ascending and transverse colon. No abnormal abdominal calcification is seen. IUD is in place. IMPRESSION: No acute finding. Large  volume of stool ascending and transverse colon. Electronically Signed   By: Inge Rise M.D.   On: 01/15/2019 13:52    Assessment and Plan:   Debbie CABRALES is a 44 y.o. y/o female here to follow-up for IBS-C.  She has a prior history of Roux-en-Y gastric bypass, gastric intestinal metaplasia. History of constipation likely IBS-C.   Being followed for left upper quadrant pain.  Felt due to constipation. She does not have a gallbladder.  EGD demonstrated gastric intestinal metaplasia in October 2020 and colonoscopy showed no gross abnormalities except hyperplastic polyps.  Failed Linzess to 290 mcg.  She was given a probiotic and she says since then she has been having regular bowel movements per day.  If constipation were to return I will try her on Trulance.  Plan 1. Continue Prilosec 40 mg once a day. 2.  Gastric intestinal metaplasia repeat endoscopy with multiple biopsies after June 2021  Dr [name]  MD,MRCP [facility]) Follow up as needed

## 2019-02-14 ENCOUNTER — Ambulatory Visit: Payer: BC Managed Care – PPO | Admitting: Gastroenterology

## 2019-03-25 ENCOUNTER — Ambulatory Visit (INDEPENDENT_AMBULATORY_CARE_PROVIDER_SITE_OTHER): Payer: BC Managed Care – PPO | Admitting: Nurse Practitioner

## 2019-03-25 ENCOUNTER — Ambulatory Visit (HOSPITAL_COMMUNITY)
Admission: RE | Admit: 2019-03-25 | Discharge: 2019-03-25 | Disposition: A | Discharge status: Home / Self Care | Payer: BC Managed Care – PPO | Source: Ambulatory Visit | Attending: Nurse Practitioner | Admitting: Nurse Practitioner

## 2019-03-25 ENCOUNTER — Other Ambulatory Visit: Payer: Self-pay

## 2019-03-25 VITALS — BP 102/68 | HR 85 | Temp 97.3°F | Resp 15 | Ht 61.0 in | Wt 203.0 lb

## 2019-03-25 DIAGNOSIS — J1282 Pneumonia due to coronavirus disease 2019: Secondary | ICD-10-CM

## 2019-03-25 DIAGNOSIS — Z8616 Personal history of COVID-19: Secondary | ICD-10-CM | POA: Insufficient documentation

## 2019-03-25 DIAGNOSIS — R0602 Shortness of breath: Secondary | ICD-10-CM | POA: Insufficient documentation

## 2019-03-25 DIAGNOSIS — U071 COVID-19: Secondary | ICD-10-CM | POA: Diagnosis present

## 2019-03-25 DIAGNOSIS — R053 Chronic cough: Secondary | ICD-10-CM | POA: Insufficient documentation

## 2019-03-25 DIAGNOSIS — R05 Cough: Secondary | ICD-10-CM | POA: Diagnosis not present

## 2019-03-25 MED ORDER — BENZONATATE 100 MG PO CAPS: 100.0000 mg | ORAL_CAPSULE | Freq: Three times a day (TID) | ORAL | 0 refills | Status: DC | PRN

## 2019-03-25 NOTE — Progress Notes (Signed)
@Patient  ID: Debbie Sosa, female    DOB: 06-29-1975, 44 y.o.   MRN: [id number]  Chief Complaint  Patient presents with  . Cough    since Jan  . Fatigue    Referring provider: Elgie Collard, MD   44 year old female with history of obesity ( sp gastric bypass), history of PE (during pregnancy). Diagnosed with Covid on 01/10/19.   Recent Significant Encounters:   01/13/19 - 01/18/19 Hospital admission: Patient was admitted to hospital for pneumonia due to covid and hypoxia. She was treated with steroids and remdesivir. She was weaned off O2 before discharge and released home on room air.    Tests:   Imaging:   02/18/19 Chest xray: Patchy bilateral basilar pulmonary parenchymal opacities concerning for pneumonia. Recommend 3-4 week follow-up chest x-ray to document resolution.    HPI  Patient presents today for post covid care visit. Patient states that since hospital discharge she continues to have shortness of breath and cough. She states at times she coughs up thick, clear mucous. She states that she is short of breath with exertion. 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 Patient states that she has a history of asthma. She had a follow up chest xray on FEB 1st which showed bilateral pneumonia and was treated with Augmentin at that time with minimal relief noted. Patient does have albuterol inhaler that she uses as needed. Denies f/c/s, n/v/d, hemoptysis, PND, leg swelling. Denies chest pain or edema.  Note: Patient was walked in office today and sats remained above 92% for the entire walk.        Allergies  Allergen Reactions  . Cephalexin Hives and Rash  . Fexofenadine Shortness Of Breath  . Ketorolac Hives  . Morphine Anaphylaxis  . Phenazopyridine Rash  . Promethazine Hives  . Cetirizine Other (See Comments)    Skin eruptures  . Contrast Media [Iodinated Diagnostic Agents] Hives  . Latex    . Morphine And Related   . Other Hives    Pain medicine that starts with an L  . Pyridium [Phenazopyridine Hcl]   . Tramadol Hives  . Vioxx [Rofecoxib]   . Hepatitis B Virus Vaccines Rash  . Naproxen Rash  . Oxycodone-Acetaminophen Itching  . Sulfa Antibiotics Rash    Immunization History  Administered Date(s) Administered  . Pneumococcal Polysaccharide-23 01/16/2019    Past Medical History:  Diagnosis Date  . Asthma   . Pulmonary embolism (HCC)     Tobacco History: Social History   Tobacco Use  Smoking Status Never Smoker  Smokeless Tobacco Never Used   Counseling given: Yes   Outpatient Encounter Medications as of 03/25/2019  Medication Sig  . albuterol (VENTOLIN HFA) 108 (90 Base) MCG/ACT inhaler Inhale 1-2 puffs INH Q4-6hr prn for chest tightness, cough, wheezing, SOB/DOE.  Marland Kitchen EPINEPHrine (EPIPEN 2-PAK) 0.3 mg/0.3 mL IJ SOAJ injection EpiPen 2-Pak 0.3 mg/0.3 mL injection, auto-injector  . tobramycin (TOBREX) 0.3 % ophthalmic solution instill 1-2 drops into affected eye twice a day  . Vitamin D, Ergocalciferol, (DRISDOL) 1.25 MG (50000 UT) CAPS capsule Take 50,000 Units by mouth once a week.  . benzonatate (TESSALON) 100 MG capsule Take 1-2 capsules (100-200 mg total) by mouth 3 (three) times daily as needed.  . diclofenac Sodium (VOLTAREN) 1 % GEL Apply 2 g topically 4 (four) times daily.  . Diclofenac Sodium 3 % GEL Place onto the skin.  . fluticasone (  FLONASE) 50 MCG/ACT nasal spray once as needed.   . montelukast (SINGULAIR) 10 MG tablet Take 10 mg by mouth daily.   . Multiple Vitamin (MULTI-VITAMINS) TABS Take by mouth.  Marland Kitchen omeprazole (PRILOSEC) 40 MG capsule Take 1 capsule (40 mg total) by mouth daily. (Patient not taking: Reported on 02/13/2019)  . sucralfate (CARAFATE) 1 g tablet Take by mouth.  . [DISCONTINUED] benzonatate (TESSALON) 100 MG capsule Take 1-2 capsules (100-200 mg total) by mouth 3 (three) times daily as needed. (Patient not taking: Reported on  02/13/2019)   No facility-administered encounter medications on file as of 03/25/2019.     Review of Systems  Review of Systems  Constitutional: Positive for activity change (decreased) and fatigue. Negative for fever.  HENT: Negative.   Respiratory: Positive for cough and shortness of breath.   Cardiovascular: Negative.   Gastrointestinal: Negative.   Allergic/Immunologic: Negative.   Neurological: Negative.   Psychiatric/Behavioral: Negative.        Physical Exam  BP 102/68   Pulse 85   Temp (!) 97.3 F (36.3 C) (Temporal)   Resp 15   Ht 5\' 1"  (1.549 m)   Wt 203 lb (92.1 kg)   SpO2 99%   BMI 38.36 kg/m   Wt Readings from Last 5 Encounters:  03/25/19 203 lb (92.1 kg)  02/13/19 201 lb 9.6 oz (91.4 kg)  01/13/19 200 lb (90.7 kg)  11/22/18 199 lb (90.3 kg)  10/31/18 199 lb (90.3 kg)     Physical Exam Vitals and nursing note reviewed.  Constitutional:      General: She is not in acute distress.    Appearance: She is well-developed.  Cardiovascular:     Rate and Rhythm: Normal rate and regular rhythm.  Pulmonary:     Effort: Pulmonary effort is normal. No respiratory distress.     Breath sounds: Normal breath sounds. No wheezing or rhonchi.     Comments: Decreased bilateral bases Neurological:     Mental Status: She is alert and oriented to person, place, and time.      Lab Results:  CBC    Component Value Date/Time   WBC 6.2 01/18/2019 0322   RBC 3.95 01/18/2019 0322   HGB 11.2 (L) 01/18/2019 0322   HGB 13.0 08/23/2013 1720   HCT 33.1 (L) 01/18/2019 0322   HCT 40.3 08/23/2013 1720   PLT 330 01/18/2019 0322   PLT 332 08/23/2013 1720   MCV 83.8 01/18/2019 0322   MCV 87 08/23/2013 1720   MCH 28.4 01/18/2019 0322   MCHC 33.8 01/18/2019 0322   RDW 14.3 01/18/2019 0322   RDW 14.6 (H) 08/23/2013 1720   LYMPHSABS 0.9 01/18/2019 0322   LYMPHSABS 2.3 08/23/2013 1720   MONOABS 0.3 01/18/2019 0322   MONOABS 0.6 08/23/2013 1720   EOSABS 0.0 01/18/2019  0322   EOSABS 0.2 08/23/2013 1720   BASOSABS 0.0 01/18/2019 0322   BASOSABS 0.1 08/23/2013 1720    BMET    Component Value Date/Time   NA 139 01/17/2019 0604   NA 140 08/23/2013 1720   K 4.2 01/17/2019 0604   K 3.7 08/23/2013 1720   CL 106 01/17/2019 0604   CL 105 08/23/2013 1720   CO2 23 01/17/2019 0604   CO2 25 08/23/2013 1720   GLUCOSE 120 (H) 01/17/2019 0604   GLUCOSE 86 08/23/2013 1720   BUN 14 01/17/2019 0604   BUN 7 08/23/2013 1720   CREATININE 0.46 01/17/2019 0604   CREATININE 0.84 08/23/2013 1720   CALCIUM 8.5 (  L) 01/17/2019 0604   CALCIUM 8.6 08/23/2013 1720   GFRNONAA >60 01/17/2019 0604   GFRNONAA >60 08/23/2013 1720   GFRAA >60 01/17/2019 0604   GFRAA >60 08/23/2013 1720    BNP    Component Value Date/Time   BNP 14.0 01/31/2018 2126    ProBNP No results found for: PROBNP  Imaging: No results found.   Assessment & Plan:   Shortness of breath Shortness of breath with chronic cough: Patient continues to have ongoing cough and shortness of breath with exertion since hospital discharge / diagnosis of covid pneumonia. She states that she does have a history of asthma, but was not on any treatment for asthma before Covid. She does use albuterol inhaler as needed. Lungs were diminished to bilateral bases upon exam.   Plan:  Patient Instructions  Will order CBC Will order chest x ray Will call with results Will place referral to pulmonary Stay active May take mucinex twice daily Will order Tessalon perles  Continue PRN inhaler as needed  Chronic Cough: Continue gastroesophageal reflux disease treatment with elevating the head your bed and taking antacids Continue taking over-the-counter antihistamines and nasal fluticasone to help with allergic rhinitis You need to try to suppress your cough to allow your larynx (voice box) to heal.  For three days don't talk, laugh, sing, or clear your throat. Do everything you can to suppress the cough during  this time. Use hard candies (sugarless Jolly Ranchers) or non-mint or non-menthol containing cough drops during this time to soothe your throat.  Use a cough suppressant (Delsym or what I have prescribed you) around the clock during this time.  After three days, gradually increase the use of your voice and back off on the cough suppressants.   Follow up in 2 months or sooner if needed        Fenton Foy, NP 03/25/2019

## 2019-03-25 NOTE — Patient Instructions (Addendum)
Will order CBC Will order chest x ray Will call with results Will place referral to pulmonary Stay active May take mucinex twice daily Will order Tessalon perles  Continue PRN inhaler as needed  Chronic Cough: Continue gastroesophageal reflux disease treatment with elevating the head your bed and taking antacids Continue taking over-the-counter antihistamines and nasal fluticasone to help with allergic rhinitis You need to try to suppress your cough to allow your larynx (voice box) to heal.  For three days don't talk, laugh, sing, or clear your throat. Do everything you can to suppress the cough during this time. Use hard candies (sugarless Jolly Ranchers) or non-mint or non-menthol containing cough drops during this time to soothe your throat.  Use a cough suppressant (Delsym or what I have prescribed you) around the clock during this time.  After three days, gradually increase the use of your voice and back off on the cough suppressants.   Follow up in 2 months or sooner if needed

## 2019-03-25 NOTE — Assessment & Plan Note (Addendum)
Shortness of breath with chronic cough: Patient continues to have ongoing cough and shortness of breath with exertion since hospital discharge / diagnosis of covid pneumonia. She states that she does have a history of asthma, but was not on any treatment for asthma before Covid. She does use albuterol inhaler as needed. Lungs were diminished to bilateral bases upon exam.   Plan:  Patient Instructions  Will order CBC Will order chest x ray Will call with results Will place referral to pulmonary Stay active May take mucinex twice daily Will order Tessalon perles  Continue PRN inhaler as needed  Chronic Cough: Continue gastroesophageal reflux disease treatment with elevating the head your bed and taking antacids Continue taking over-the-counter antihistamines and nasal fluticasone to help with allergic rhinitis You need to try to suppress your cough to allow your larynx (voice box) to heal.  For three days don't talk, laugh, sing, or clear your throat. Do everything you can to suppress the cough during this time. Use hard candies (sugarless Jolly Ranchers) or non-mint or non-menthol containing cough drops during this time to soothe your throat.  Use a cough suppressant (Delsym or what I have prescribed you) around the clock during this time.  After three days, gradually increase the use of your voice and back off on the cough suppressants.   Follow up in 2 months or sooner if needed

## 2019-04-15 ENCOUNTER — Telehealth: Payer: Self-pay | Admitting: Internal Medicine

## 2019-04-15 NOTE — Telephone Encounter (Signed)
Called and spoke to pt. Pt states she has had her cough and SOB for months and this is not something new. Pt denies any other issues or s/s or covid contacts. Advised pt ok for pt to come in for appt. Nothing further needed at this time.

## 2019-04-16 ENCOUNTER — Ambulatory Visit: Payer: BC Managed Care – PPO | Admitting: Internal Medicine

## 2019-04-16 ENCOUNTER — Encounter: Payer: Self-pay | Admitting: Internal Medicine

## 2019-04-16 ENCOUNTER — Other Ambulatory Visit: Payer: Self-pay

## 2019-04-16 VITALS — BP 116/64 | HR 85 | Temp 98.1°F | Ht 61.0 in | Wt 204.4 lb

## 2019-04-16 DIAGNOSIS — R05 Cough: Secondary | ICD-10-CM | POA: Diagnosis not present

## 2019-04-16 DIAGNOSIS — R059 Cough, unspecified: Secondary | ICD-10-CM

## 2019-04-16 DIAGNOSIS — J4520 Mild intermittent asthma, uncomplicated: Secondary | ICD-10-CM | POA: Diagnosis not present

## 2019-04-16 DIAGNOSIS — U071 COVID-19: Secondary | ICD-10-CM

## 2019-04-16 DIAGNOSIS — R1012 Left upper quadrant pain: Secondary | ICD-10-CM | POA: Diagnosis not present

## 2019-04-16 MED ORDER — PANTOPRAZOLE SODIUM 40 MG PO TBEC: 40.0000 mg | DELAYED_RELEASE_TABLET | Freq: Every day | ORAL | 5 refills | Status: DC

## 2019-04-16 MED ORDER — EPINEPHRINE 0.3 MG/0.3ML IJ SOAJ: 0.3000 mg | INTRAMUSCULAR | 1 refills | Status: AC | PRN

## 2019-04-16 MED ORDER — ADVAIR HFA 115-21 MCG/ACT IN AERO: 2.0000 | INHALATION_SPRAY | Freq: Two times a day (BID) | RESPIRATORY_TRACT | 5 refills | Status: DC

## 2019-04-16 NOTE — Patient Instructions (Addendum)
The patient should have follow up scheduled with myself in 2 months.   Prior to next visit patient should have:  Spirometry/Feno  Start taking protonix once a day.  Start taking advair 1 puff twice a day. Gargle after use.   What is GERD? Gastroesophageal reflux disease (GERD) is gastroesophageal reflux diseasewhich occurs when the lower esophageal sphincter (LES) opens spontaneously, for varying periods of time, or does not close properly and stomach contents rise up into the esophagus. GER is also called acid reflux or acid regurgitation, because digestive juices--called acids--rise up with the food. The esophagus is the tube that carries food from the mouth to the stomach. The LES is a ring of muscle at the bottom of the esophagus that acts like a valve between the esophagus and stomach.  When acid reflux occurs, food or fluid can be tasted in the back of the mouth. When refluxed stomach acid touches the lining of the esophagus it may cause a burning sensation in the chest or throat called heartburn or acid indigestion. Occasional reflux is common. Persistent reflux that occurs more than twice a week is considered GERD, and it can eventually lead to more serious health problems. People of all ages can have GERD. Studies have shown that GERD may worsen or contribute to asthma, chronic cough, and pulmonary fibrosis.   What are the symptoms of GERD? The main symptom of GERD in adults is frequent heartburn, also called acid indigestion--burning-type pain in the lower part of the mid-chest, behind the breast bone, and in the mid-abdomen.  Not all reflux is acidic in nature, and many patients don't have heart burn at all. Sometimes it feels like a cough (either dry or with mucus), choking sensation, asthma, shortness of breath, waking up at night, frequent throat clearing, or trouble swallowing.    What causes GERD? The reason some people develop GERD is still unclear. However, research shows that  in people with GERD, the LES relaxes while the rest of the esophagus is working. Anatomical abnormalities such as a hiatal hernia may also contribute to GERD. A hiatal hernia occurs when the upper part of the stomach and the LES move above the diaphragm, the muscle wall that separates the stomach from the chest. Normally, the diaphragm helps the LES keep acid from rising up into the esophagus. When a hiatal hernia is present, acid reflux can occur more easily. A hiatal hernia can occur in people of any age and is most often a normal finding in otherwise healthy people over age 50. Most of the time, a hiatal hernia produces no symptoms.   Other factors that may contribute to GERD include - Obesity or recent weight gain - Pregnancy  - Smoking  - Diet - Certain medications  Common foods that can worsen reflux symptoms include: - carbonated beverages - artificial sweeteners - citrus fruits  - chocolate  - drinks with caffeine or alcohol  - fatty and fried foods  - garlic and onions  - mint flavorings  - spicy foods  - tomato-based foods, like spaghetti sauce, salsa, chili, and pizza   Lifestyle Changes If you smoke, stop.  Avoid foods and beverages that worsen symptoms (see above.) Lose weight if needed.  Eat small, frequent meals.  Wear loose-fitting clothes.  Avoid lying down for 3 hours after a meal.  Raise the head of your bed 6 to 8 inches by securing wood blocks under the bedposts. Just using extra pillows will not help, but using a wedge-shaped  pillow may be helpful.  Medications  H2 blockers, such as cimetidine (Tagamet HB), famotidine (Pepcid AC), nizatidine (Axid AR), and ranitidine (Zantac 75), decrease acid production. They are available in prescription strength and over-the-counter strength. These drugs provide short-term relief and are effective for about half of those who have GERD symptoms.  Proton pump inhibitors include omeprazole (Prilosec, Zegerid), lansoprazole  (Prevacid), pantoprazole (Protonix), rabeprazole (Aciphex), and esomeprazole (Nexium), which are available by prescription. Prilosec is also available in over-the-counter strength. Proton pump inhibitors are more effective than H2 blockers and can relieve symptoms and heal the esophageal lining in almost everyone who has GERD.  Because drugs work in different ways, combinations of medications may help control symptoms. People who get heartburn after eating may take both antacids and H2 blockers. The antacids work first to neutralize the acid in the stomach, and then the H2 blockers act on acid production. By the time the antacid stops working, the H2 blocker will have stopped acid production. Your health care provider is the best source of information about how to use medications for GERD.   Points to Remember 1. You can have GERD without having heartburn. Your symptoms could include a dry cough, asthma symptoms, or trouble swallowing.  2. Taking medications daily as prescribed is important in controlling you symptoms.  Sometimes it can take up to 8 weeks to fully achieve the effects of the medications prescribed.  3. Coughing related to GERD can be difficult to treat and is very frustrating!  However, it is important to stick with these medications and lifestyle modifications before pursuing more aggressive or invasive test and treatments.    ------------------------------------------------------------------------------------------------------------------- Metered Dose Inhaler (MDI) Instructions (Albuterol, Advair)  Before using your inhaler for the first time: 1. Take the cap off the mouthpiece. 2. Shake the inhaler for 5 seconds 3. Press down on the canister to spray the medicine into the air. 4. Repeat these steps 3 more times. If you haven't used your inhaler in more than 2 weeks, repeat these steps before using it.  To use your inhaler: 1. Take the cap off the mouthpiece 2. Shake the  inhaler for 5 seconds. 3. Hold it upright with your finger on the top of the canister and your thumb on the bottom of the inhaler. 4. Breathe out. 5. Close your lips around the mouthpiece. 6. As you start to inhale the next breath, press down on the canister. 7. Inhale deeply and slowly through your mouth. 8. Hold your breath for 5 to 10 seconds to keep the medicine in your lungs. 9. Let your breath out.   10. Repeat these steps if you are supposed to take 2 puffs.  11. Put the cap back on the mouthpiece 12. Remember to rinse, gargle and spit with water after use if your inhaler has a steroid in it (Advair, Symbicort, Dulera, Qvar, Flovent)  Caring for your MDI and chamber For most MDIs, remove the canister and rinse the plastic holder with warm running water once a week to prevent the holes from getting clogged. Shake well and let air dry. There are some medications in which the inhaler cannot be removed from the holder. These usually need to be cleaned by wiping the mouthpiece with a cloth or cleaning with a dry cotton swab. Refer to the patient instructions that come with your inhaler. Clean the chamber about once a week. Remove the soft ring at the end of the chamber. Soak the spacer in warm water with a mild  detergent. Carefully clean and, rinse, and shake off excess water. Do not hand dry. Allow to completely air dry. Do not store the chamber in a plastic bag.  Checking your MDI It is important that you know how much medication is left in your inhaler. The number of puffs contained in your MDI is printed on the side of the canister. After you have used that number of puffs, you must discard your inhaler even if it continues to spray. Keep track of how many puffs you have used. You also must include priming puffs in this total. If you use an MDI every day for control of symptoms, you can determine how long it will last by dividing the total number of puffs in the MDI by the total puffs you use  every day. For example: 2 puffs x 2 times per day = 4 total puffs per day. At 120 puffs, the MDI will last 30 days. If you use an inhaler only when you need to, you must keep track of how many times you spray the inhaler. Some of the newer MDIs have counting devices built in.  If your MDI does not have a dose counter, you can obtain a device that attaches to the MDI and counts down the number of puffs each time you press the inhaler. Ask your health care professional for more information about these devices, as well as how to best keep track of your medicine without an add-on device (if you prefer).

## 2019-04-16 NOTE — Progress Notes (Signed)
Debbie Sosa    [id number]    12/09/1975  Primary Care Physician:Knowles-Jonas, Hulan Fray, MD  Referring Physician: Fenton Foy, NP [place],  [place] [id number] Reason for Consultation: covid 19 infection Date of Consultation: 04/16/2019  Chief complaint:   Chief Complaint  Patient presents with  . Consult     HPI: Debbie Sosa is a 44 y.o. woman being referred from [place] 19 follow up clinic for ongoing shortness of breath.  She was hospitalized Dec 27-1/1 for pneumonia, treated with steroids and remdesevir. She was discharged on room air.   She is a [employer]. She is short of breath and fatigued with daily activities.  Tired after going grocery shopping, coming home from work. Has been sucking on jolly ranchers to help with her cough. She has been trying to practice deep breathing  She had an ambulatory desaturation study that dropped her down to 92% in the office in her last clinic appt. At home she goes down to 94%. She is using an air purifier and humidifier to sleep with. Her cough is present all day but also at night.  Cough is improved when she elevates herself with multiple pillows.  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 Has been taken albuterol for cough but doesn't seem to be working. Cough is dry but she feels like it's hard to bring something up. She does have a history of acid reflux and history of gastric bypass.   Had childhood bronchitis/asthma. Has severe seasonal allergies takes singulair. 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  Smoking history: never smoker.   Social History   Occupational History  . Not on file  Tobacco Use  . Smoking status: Never Smoker  . Smokeless tobacco: Never Used  Substance and Sexual Activity    . Alcohol use: No  . Drug use: No  . Sexual activity: Not on file    Relevant family history:  Family History  Problem Relation Age of Onset  . Breast cancer Mother 35  . Breast cancer Cousin 30  . Breast cancer Cousin 76  . Asthma Father   . Asthma Sister   . Asthma Child     Past Medical History:  Diagnosis Date  . Asthma   . Pulmonary embolism [facility])     Past Surgical History:  Procedure Laterality Date  . BREAST BIOPSY Left 2006   benign/clip  . BREAST BIOPSY Right 2015   benign/clip  . BREAST REDUCTION SURGERY    . CESAREAN SECTION    . CESAREAN SECTION    . CHOLECYSTECTOMY    . COLONOSCOPY    . COLONOSCOPY WITH PROPOFOL N/A 10/31/2018   Procedure: COLONOSCOPY WITH PROPOFOL;  Surgeon: [name], MD;  Location: [facility] ENDOSCOPY;  Service: Gastroenterology;  Laterality: N/A;  . ESOPHAGOGASTRODUODENOSCOPY (EGD) WITH PROPOFOL N/A 10/04/2016   Procedure: ESOPHAGOGASTRODUODENOSCOPY (EGD) WITH PROPOFOL;  Surgeon: [name], MD;  Location: [facility] ENDOSCOPY;  Service: Gastroenterology;  Laterality: N/A;  . ESOPHAGOGASTRODUODENOSCOPY (EGD) WITH PROPOFOL N/A 04/14/2017   Procedure: ESOPHAGOGASTRODUODENOSCOPY (EGD) WITH PROPOFOL;  Surgeon: [name], MD;  Location: [facility] ENDOSCOPY;  Service: Gastroenterology;  Laterality: N/A;  . ESOPHAGOGASTRODUODENOSCOPY (EGD) WITH PROPOFOL N/A 10/31/2018   Procedure: ESOPHAGOGASTRODUODENOSCOPY (EGD) WITH PROPOFOL;  Surgeon: [name],  MD;  Location: [facility] ENDOSCOPY;  Service: Gastroenterology;  Laterality: N/A;  . GASTRIC BYPASS     01/2015  . KNEE SURGERY    . NASAL SINUS SURGERY    . PLANTAR FASCIA SURGERY     x 3  . REDUCTION MAMMAPLASTY Bilateral 2005     Review of systems: Review of Systems  Constitutional: Positive for malaise/fatigue. Negative for chills, fever and weight loss.  HENT: Negative for congestion, sinus pain and sore throat.   Eyes: Negative for discharge and redness.  Respiratory: Positive for cough and  shortness of breath. Negative for hemoptysis, sputum production and wheezing.   Cardiovascular: Negative for chest pain, palpitations and leg swelling.  Gastrointestinal: Positive for heartburn. Negative for nausea and vomiting.  Musculoskeletal: Negative for joint pain and myalgias.  Skin: Negative for rash.  Neurological: Negative for dizziness, tremors, focal weakness and headaches.  Endo/Heme/Allergies: Positive for environmental allergies.  Psychiatric/Behavioral: Negative for depression. The patient is not nervous/anxious.   All other systems reviewed and are negative.   Physical Exam: Blood pressure 116/64, pulse 85, temperature 98.1 F (36.7 C), temperature source Temporal, height 5\' 1"  (1.549 m), weight 204 lb 6.4 oz (92.7 kg), SpO2 99 %. Gen:      No acute distress ENT:  no nasal polyps, mucus membranes moist Lungs:    No increased respiratory effort, symmetric chest wall excursion, clear to auscultation bilaterally, no wheezes or crackles CV:         Regular rate and rhythm; no murmurs, rubs, or gallops.  No pedal edema Abd:      + bowel sounds; soft, non-tender; no distension MSK: no acute synovitis of DIP or PIP joints, no mechanics hands.  Skin:      Warm and dry; no rashes Neuro: normal speech, no focal facial asymmetry Psych: alert and oriented x3, normal mood and affect   Data Reviewed/Medical Decision Making:  Independent interpretation of tests: Imaging: . Review of patient's chest xray images revealed no acute cardiopulomary process on march 9 which is improved from December 2020. The patient's images have been independently reviewed by me.    PFTs: Never had but maybe spirometry in allergy clinic?  Labs:  Lab Results  Component Value Date   WBC 6.2 01/18/2019   HGB 11.2 (L) 01/18/2019   HCT 33.1 (L) 01/18/2019   MCV 83.8 01/18/2019   PLT 330 01/18/2019     Immunization status:  Immunization History  Administered Date(s) Administered  . Pneumococcal  Polysaccharide-23 01/16/2019    . I reviewed prior external note(s) from Covid 19 clinic . I reviewed the result(s) of the labs and imaging as noted above.  . I have ordered   Assessment:  Mild intermittent asthma Allergic rhinitis covid 19 infection GERD with history of gastric bypass.  Chronic cough   Plan/Recommendations: Suspect multifactorial cough. Will start PPI for GERD and discussed lifestyle modifications as well Given asthma history will trial ICS-LABA to see if cough improves with asthma treatment. 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 May take time to improve. Exercise as tolerated.   Return to Care: Return in about 2 months (around 06/16/2019).  Lenice Llamas, MD [facility]  CC: Fenton Foy, NP

## 2019-05-13 ENCOUNTER — Other Ambulatory Visit (HOSPITAL_COMMUNITY)
Admission: RE | Admit: 2019-05-13 | Discharge: 2019-05-13 | Disposition: A | Discharge status: Home / Self Care | Payer: BC Managed Care – PPO | Source: Ambulatory Visit | Attending: Internal Medicine | Admitting: Internal Medicine

## 2019-05-13 DIAGNOSIS — Z20822 Contact with and (suspected) exposure to covid-19: Secondary | ICD-10-CM | POA: Insufficient documentation

## 2019-05-13 DIAGNOSIS — Z01812 Encounter for preprocedural laboratory examination: Secondary | ICD-10-CM | POA: Insufficient documentation

## 2019-05-13 LAB — SARS CORONAVIRUS 2 (TAT 6-24 HRS): SARS Coronavirus 2: NEGATIVE

## 2019-05-15 ENCOUNTER — Telehealth: Payer: Self-pay

## 2019-05-15 NOTE — Telephone Encounter (Signed)
Endoscopy June 2021 per patient. Please call to schedule [phone].  June 10 & 11 & 14 preferred dates if possible. Thursday 06/27/19 would be perfect.

## 2019-05-15 NOTE — Addendum Note (Signed)
Addended by: Amado Coe on: 05/15/2019 03:13 PM   Modules accepted: Orders

## 2019-05-16 ENCOUNTER — Other Ambulatory Visit: Payer: Self-pay

## 2019-05-16 ENCOUNTER — Ambulatory Visit (INDEPENDENT_AMBULATORY_CARE_PROVIDER_SITE_OTHER): Payer: BC Managed Care – PPO

## 2019-05-16 DIAGNOSIS — K31A0 Gastric intestinal metaplasia, unspecified: Secondary | ICD-10-CM

## 2019-05-16 DIAGNOSIS — K3189 Other diseases of stomach and duodenum: Secondary | ICD-10-CM

## 2019-05-16 DIAGNOSIS — R053 Chronic cough: Secondary | ICD-10-CM

## 2019-05-16 DIAGNOSIS — R05 Cough: Secondary | ICD-10-CM | POA: Diagnosis not present

## 2019-05-16 DIAGNOSIS — Z8616 Personal history of COVID-19: Secondary | ICD-10-CM | POA: Diagnosis not present

## 2019-05-16 LAB — NITRIC OXIDE: Nitric Oxide: 32

## 2019-05-16 NOTE — Telephone Encounter (Signed)
Spoke with pt and was able to schedule her repeat upper endoscopy.

## 2019-05-21 ENCOUNTER — Telehealth: Payer: Self-pay | Admitting: Internal Medicine

## 2019-05-21 NOTE — Telephone Encounter (Signed)
Called and spoke with Patient. Patient requested results of spirometry 05/16/19.    Message routed to Dr. Shearon Stalls

## 2019-05-21 NOTE — Telephone Encounter (Signed)
Spoke with the pt and notified of results/recs per Dr Shearon Stalls and she verbalized understanding.

## 2019-05-21 NOTE — Telephone Encounter (Signed)
Please let patient know that her results were most consistent with asthma. We can discuss further at her follow up appointment.

## 2019-05-29 ENCOUNTER — Other Ambulatory Visit: Payer: Self-pay

## 2019-05-29 ENCOUNTER — Encounter: Payer: Self-pay | Admitting: Internal Medicine

## 2019-05-29 ENCOUNTER — Ambulatory Visit: Payer: BC Managed Care – PPO | Admitting: Internal Medicine

## 2019-05-29 VITALS — BP 130/70 | HR 91 | Temp 98.0°F | Ht 61.5 in | Wt 204.4 lb

## 2019-05-29 DIAGNOSIS — K219 Gastro-esophageal reflux disease without esophagitis: Secondary | ICD-10-CM

## 2019-05-29 DIAGNOSIS — U071 COVID-19: Secondary | ICD-10-CM

## 2019-05-29 DIAGNOSIS — J4540 Moderate persistent asthma, uncomplicated: Secondary | ICD-10-CM

## 2019-05-29 NOTE — Patient Instructions (Signed)
The patient should have follow up scheduled with myself in 4 months.   Remember to take the advair twice a day!  Take the albuterol rescue inhaler every 4 to 6 hours as needed for wheezing or shortness of breath. You can also take it 15 minutes before exercise or exertional activity. Side effects include heart racing or pounding, jitters or anxiety. If you have a history of an irregular heart rhythm, it can make this worse. Can also give some patients a hard time sleeping.  To inhale the aerosol using an inhaler, follow these steps:  1. Remove the protective dust cap from the end of the mouthpiece. If the dust cap was not placed on the mouthpiece, check the mouthpiece for dirt or other objects. Be sure that the canister is fully and firmly inserted in the mouthpiece. 2. If you are using the inhaler for the first time or if you have not used the inhaler in more than 14 days, you will need to prime it. You may also need to prime the inhaler if it has been dropped. Ask your pharmacist or check the manufacturer's information if this happens. To prime the inhaler, shake it well and then press down on the canister 4 times to release 4 sprays into the air, away from your face. Be careful not to get albuterol in your eyes. 3. Shake the inhaler well. 4. Breathe out as completely as possible through your mouth. 4. Hold the canister with the mouthpiece on the bottom, facing you and the canister pointing upward. Place the open end of the mouthpiece into your mouth. Close your lips tightly around the mouthpiece. 6. Breathe in slowly and deeply through the mouthpiece.At the same time, press down once on the container to spray the medication into your mouth. 7. Try to hold your breath for 10 seconds. remove the inhaler, and breathe out slowly. 8. If you were told to use 2 puffs, wait 1 minute and then repeat steps 3-7. 9. Replace the protective cap on the inhaler. 10. Clean your inhaler regularly. Follow the  manufacturer's directions carefully and ask your doctor or pharmacist if you have any questions about cleaning your inhaler.  Check the back of the inhaler to keep track of the total number of doses left on the inhaler.     Instructions For Advair Diskus Take the ADVAIR DISKUS out of the box and foil overwrap pouch. Write the "Pouch opened" and "Use by" dates on the label on top of the DISKUS. The "Use by" date is 1 month from date of opening the pouch.     * The DISKUS will be in the closed position when the pouch is opened.     * The dose indicator on the top of the DISKUS tells you how many doses are left. The dose indicator number will decrease each time you use the DISKUS. After you have used 55 doses from the DISKUS, the numbers 5 to 0 will appear in red to warn you that there are only a few doses left. If you are using a "sample" DISKUS, the numbers 5 to 0 will appear in red after 23 doses.  Taking a dose from the DISKUS requires the following 3 simple steps: Open, Click, Inhale.  2. OPEN Hold the DISKUS in one hand and put the thumb of your other hand on the thumbgrip. Push your thumb away from you as far as it will go until the mouthpiece appears and snaps into position.  2.  CLICK Hold the DISKUS in a level, flat position with the mouthpiece toward you. Slide the lever away from you as far as it will go until it clicks. The DISKUS is now ready to use. Every time the lever is pushed back, a dose is ready to be inhaled. This is shown by a decrease in numbers on the dose counter. To avoid releasing or wasting doses once the DISKUS is ready: Do not close the DISKUS. Do not tilt the DISKUS. Do not play with the lever. Do not move the lever more than once.  3.   INHALE Before inhaling your dose from the DISKUS, breathe out (exhale) fully while holding the DISKUS level and away from your mouth. Remember, never breathe out into the DISKUS mouthpiece.  Put the mouthpiece to your lips.  Breathe in quickly and deeply through the DISKUS. Do not breathe in through your nose.  Remove the DISKUS from your mouth. Hold your breath for about 10 seconds, or for as long as is comfortable. Breathe out slowly.  The DISKUS delivers your dose of medicine as a very fine powder. Most patients can taste or feel the powder. Do not use another dose from the DISKUS if you do not feel or taste the medicine.  Rinse your mouth with water after breathing in the medicine. Spit the water out. Do not swallow.  4.  CLOSE the DISKUS when you are finished taking a dose so that the DISKUS will be ready for you to take your next dose. Put your thumb on the thumbgrip and slide the thumbgrip back toward you as far as it will go. The DISKUS will click shut. The lever will automatically return to its original position. The DISKUS is now ready for you to take your next scheduled dose, due in about 12 hours. (Repeat the steps 1 to 4.)  REMEMBER: Never breathe into the DISKUS. Never take the DISKUS apart. Always ready and use the DISKUS in a level, flat position. Do not use the DISKUS with a spacer device. After each dose, rinse your mouth with water and spit the water out. Do not swallow. Never wash the mouthpiece or any part of the DISKUS. Keep it dry. Always keep the DISKUS in a dry place. Never take an extra dose, even if you did not taste or feel the medicine.

## 2019-05-29 NOTE — Progress Notes (Signed)
Debbie Sosa    WU:[id number]    1975/10/16  Primary Care Physician:Knowles-Jonas, Hulan Fray, MD Date of Appointment: 05/29/2019 Established Patient Visit  Chief complaint:   Chief Complaint  Patient presents with  . Follow-up    pollen affecting breathing.  still out of breath.  Got a wedge pillow.Cough is better.    HPI: Debbie Sosa STATES is a 44 y.o. woman with moderate persistent asthma and covid infection requring hospitalization in Dec 2020.   Interval Updates: Feeling better since last visit with cough. Able to get through work a little better without cough.  Fatigue is still persistent. Taking naps.  Forgetting night time dose of advair very sparingly - unless she accidentally falls asleep. 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   Current Regimen: advair BID, albuterol prn Asthma Triggers: URI.  Exacerbations in the last year: History of hospitalization or intubation: Allergy Testing: she did have this done at [facility] in [place], multiple grasses and trees, molds on SPT. She was getting SCIT but this was interrupted during covid.  GERD: yes, improved with wedge pillow Allergic Rhinitis: yes ACT:  Asthma Control Test ACT Total Score  05/29/2019 11   FeNO: 32 ppb  I have reviewed the patient's family social and past medical history and updated as appropriate.   Past Medical History:  Diagnosis Date  . Asthma   . Pulmonary embolism [facility])     Past Surgical History:  Procedure Laterality Date  . BREAST BIOPSY Left 2006   benign/clip  . BREAST BIOPSY Right 2015   benign/clip  . BREAST REDUCTION SURGERY    . CESAREAN SECTION    . CESAREAN SECTION    . CHOLECYSTECTOMY    . COLONOSCOPY    . COLONOSCOPY WITH PROPOFOL N/A 10/31/2018   Procedure: COLONOSCOPY WITH PROPOFOL;  Surgeon: Debbie Bellows, MD;  Location: [facility] ENDOSCOPY;  Service: Gastroenterology;  Laterality: N/A;  . ESOPHAGOGASTRODUODENOSCOPY (EGD) WITH PROPOFOL N/A 10/04/2016   Procedure:  ESOPHAGOGASTRODUODENOSCOPY (EGD) WITH PROPOFOL;  Surgeon: Debbie Bellows, MD;  Location: [facility] ENDOSCOPY;  Service: Gastroenterology;  Laterality: N/A;  . ESOPHAGOGASTRODUODENOSCOPY (EGD) WITH PROPOFOL N/A 04/14/2017   Procedure: ESOPHAGOGASTRODUODENOSCOPY (EGD) WITH PROPOFOL;  Surgeon: Debbie Bellows, MD;  Location: [facility] ENDOSCOPY;  Service: Gastroenterology;  Laterality: N/A;  . ESOPHAGOGASTRODUODENOSCOPY (EGD) WITH PROPOFOL N/A 10/31/2018   Procedure: ESOPHAGOGASTRODUODENOSCOPY (EGD) WITH PROPOFOL;  Surgeon: Debbie Bellows, MD;  Location: [facility] ENDOSCOPY;  Service: Gastroenterology;  Laterality: N/A;  . GASTRIC BYPASS     01/2015  . KNEE SURGERY    . NASAL SINUS SURGERY    . PLANTAR FASCIA SURGERY     x 3  . REDUCTION MAMMAPLASTY Bilateral 2005    Family History  Problem Relation Age of Onset  . Breast cancer Mother 3  . Breast cancer Cousin 30  . Breast cancer Cousin 70  . Asthma Father   . Asthma Sister   . Asthma Child     Social History   Occupational History  . Not on file  Tobacco Use  . Smoking status: Never Smoker  . Smokeless tobacco: Never Used  Substance and Sexual Activity  . Alcohol use: No  . Drug use: No  . Sexual activity: Not on file     Physical Exam: Blood pressure 130/70, pulse 91, temperature 98 F (36.7 C), temperature source Temporal, height 5' 1.5" (1.562 m), weight 204 lb 6.4 oz (92.7 kg), SpO2 100 %.  Gen:  No acute distress Lungs:    No increased respiratory effort, symmetric chest wall excursion, clear to auscultation bilaterally, no wheezes or crackles CV:         Regular rate and rhythm; no murmurs, rubs, or gallops.  No pedal edema   Data Reviewed: Imaging: I have personally reviewed the chest xray March 2021 no acute cardiopulmonary process.   PFTs: I have personally reviewed the patient's PFTs and normal spirometry, FeNO elevated at 32 ppb  Labs: Lab Results  Component Value Date   WBC 6.2 01/18/2019   HGB 11.2 (L) 01/18/2019    HCT 33.1 (L) 01/18/2019   MCV 83.8 01/18/2019   PLT 330 01/18/2019    Immunization status: Immunization History  Administered Date(s) Administered  . PFIZER SARS-COV-2 Vaccination 04/18/2019, 05/08/2019  . Pneumococcal Polysaccharide-23 01/16/2019   Assessment:  Moderate Persistent asthma Allergic rhinitis covid 19 infection GERD with history of gastric bypass.  Chronic cough - multifactorial   Plan/Recommendations: Continue PPI and wedge pillow.  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 May take time to improve. Exercise as tolerated Allergies not well controlled. Would follow up with allergy at [place] in [facility] to resume SCIT.   Return to Care: Return in about 4 months (around 09/29/2019).   Debbie Llamas, MD [facility] [place]

## 2019-06-26 ENCOUNTER — Other Ambulatory Visit
Admission: RE | Admit: 2019-06-26 | Discharge: 2019-06-26 | Disposition: A | Discharge status: Home / Self Care | Payer: BC Managed Care – PPO | Source: Ambulatory Visit | Attending: Gastroenterology | Admitting: Gastroenterology

## 2019-06-26 ENCOUNTER — Other Ambulatory Visit: Payer: Self-pay

## 2019-06-26 DIAGNOSIS — Z20822 Contact with and (suspected) exposure to covid-19: Secondary | ICD-10-CM | POA: Diagnosis not present

## 2019-06-26 DIAGNOSIS — Z01812 Encounter for preprocedural laboratory examination: Secondary | ICD-10-CM | POA: Diagnosis present

## 2019-06-26 LAB — SARS CORONAVIRUS 2 (TAT 6-24 HRS): SARS Coronavirus 2: NEGATIVE

## 2019-06-28 ENCOUNTER — Ambulatory Visit
Admission: RE | Admit: 2019-06-28 | Discharge: 2019-06-28 | Disposition: A | Discharge status: Home / Self Care | Payer: BC Managed Care – PPO | Attending: Gastroenterology | Admitting: Gastroenterology

## 2019-06-28 ENCOUNTER — Encounter: Payer: Self-pay | Admitting: Gastroenterology

## 2019-06-28 ENCOUNTER — Ambulatory Visit: Payer: BC Managed Care – PPO | Admitting: Anesthesiology

## 2019-06-28 ENCOUNTER — Other Ambulatory Visit: Payer: Self-pay

## 2019-06-28 ENCOUNTER — Encounter
Admission: RE | Disposition: A | Discharge status: Home / Self Care | Payer: Self-pay | Source: Home / Self Care | Attending: Gastroenterology

## 2019-06-28 DIAGNOSIS — J45909 Unspecified asthma, uncomplicated: Secondary | ICD-10-CM | POA: Insufficient documentation

## 2019-06-28 DIAGNOSIS — Z91040 Latex allergy status: Secondary | ICD-10-CM | POA: Insufficient documentation

## 2019-06-28 DIAGNOSIS — Z79899 Other long term (current) drug therapy: Secondary | ICD-10-CM | POA: Insufficient documentation

## 2019-06-28 DIAGNOSIS — R0602 Shortness of breath: Secondary | ICD-10-CM | POA: Diagnosis not present

## 2019-06-28 DIAGNOSIS — K2940 Chronic atrophic gastritis without bleeding: Secondary | ICD-10-CM | POA: Insufficient documentation

## 2019-06-28 DIAGNOSIS — Z980 Intestinal bypass and anastomosis status: Secondary | ICD-10-CM | POA: Insufficient documentation

## 2019-06-28 DIAGNOSIS — Z825 Family history of asthma and other chronic lower respiratory diseases: Secondary | ICD-10-CM | POA: Insufficient documentation

## 2019-06-28 DIAGNOSIS — Z887 Allergy status to serum and vaccine status: Secondary | ICD-10-CM | POA: Diagnosis not present

## 2019-06-28 DIAGNOSIS — K219 Gastro-esophageal reflux disease without esophagitis: Secondary | ICD-10-CM | POA: Diagnosis not present

## 2019-06-28 DIAGNOSIS — Z86711 Personal history of pulmonary embolism: Secondary | ICD-10-CM | POA: Insufficient documentation

## 2019-06-28 DIAGNOSIS — Z91041 Radiographic dye allergy status: Secondary | ICD-10-CM | POA: Diagnosis not present

## 2019-06-28 DIAGNOSIS — K31A0 Gastric intestinal metaplasia, unspecified: Secondary | ICD-10-CM

## 2019-06-28 DIAGNOSIS — K3189 Other diseases of stomach and duodenum: Secondary | ICD-10-CM | POA: Insufficient documentation

## 2019-06-28 DIAGNOSIS — Z885 Allergy status to narcotic agent status: Secondary | ICD-10-CM | POA: Insufficient documentation

## 2019-06-28 DIAGNOSIS — Z9049 Acquired absence of other specified parts of digestive tract: Secondary | ICD-10-CM | POA: Insufficient documentation

## 2019-06-28 DIAGNOSIS — Z791 Long term (current) use of non-steroidal anti-inflammatories (NSAID): Secondary | ICD-10-CM | POA: Diagnosis not present

## 2019-06-28 DIAGNOSIS — Z803 Family history of malignant neoplasm of breast: Secondary | ICD-10-CM | POA: Diagnosis not present

## 2019-06-28 DIAGNOSIS — Z888 Allergy status to other drugs, medicaments and biological substances status: Secondary | ICD-10-CM | POA: Diagnosis not present

## 2019-06-28 DIAGNOSIS — Z882 Allergy status to sulfonamides status: Secondary | ICD-10-CM | POA: Diagnosis not present

## 2019-06-28 DIAGNOSIS — Z9884 Bariatric surgery status: Secondary | ICD-10-CM | POA: Insufficient documentation

## 2019-06-28 HISTORY — PX: ESOPHAGOGASTRODUODENOSCOPY (EGD) WITH PROPOFOL: SHX5813

## 2019-06-28 LAB — POCT PREGNANCY, URINE: Preg Test, Ur: NEGATIVE

## 2019-06-28 SURGERY — ESOPHAGOGASTRODUODENOSCOPY (EGD) WITH PROPOFOL
Anesthesia: General

## 2019-06-28 MED ORDER — PROPOFOL 10 MG/ML IV BOLUS
INTRAVENOUS | Status: DC | PRN
  Administered 2019-06-28: 60 mg via INTRAVENOUS

## 2019-06-28 MED ORDER — LIDOCAINE HCL (CARDIAC) PF 100 MG/5ML IV SOSY
PREFILLED_SYRINGE | INTRAVENOUS | Status: DC | PRN
  Administered 2019-06-28: 50 mg via INTRAVENOUS

## 2019-06-28 MED ORDER — MIDAZOLAM HCL 2 MG/2ML IJ SOLN
INTRAMUSCULAR | Status: AC
  Filled 2019-06-28: qty 2

## 2019-06-28 MED ORDER — PROPOFOL 500 MG/50ML IV EMUL
INTRAVENOUS | Status: DC | PRN
  Administered 2019-06-28: 100 ug/kg/min via INTRAVENOUS

## 2019-06-28 MED ORDER — SODIUM CHLORIDE 0.9 % IV SOLN
INTRAVENOUS | Status: DC
  Administered 2019-06-28: 1000 mL via INTRAVENOUS

## 2019-06-28 MED ORDER — MIDAZOLAM HCL 2 MG/2ML IJ SOLN
INTRAMUSCULAR | Status: DC | PRN
  Administered 2019-06-28 (×2): 1 mg via INTRAVENOUS

## 2019-06-28 MED ORDER — PROPOFOL 10 MG/ML IV BOLUS
INTRAVENOUS | Status: AC
  Filled 2019-06-28: qty 20

## 2019-06-28 NOTE — Anesthesia Preprocedure Evaluation (Signed)
Anesthesia Evaluation  Patient identified by MRN, date of birth, ID band Patient awake    Reviewed: Allergy & Precautions, H&P , NPO status , Patient's Chart, lab work & pertinent test results, reviewed documented beta blocker date and time   Airway Mallampati: II   Neck ROM: full    Dental  (+) Poor Dentition   Pulmonary shortness of breath and with exertion, asthma , pneumonia, resolved,    Pulmonary exam normal        Cardiovascular Exercise Tolerance: Good negative cardio ROS Normal cardiovascular exam Rhythm:regular Rate:Normal     Neuro/Psych negative neurological ROS  negative psych ROS   GI/Hepatic Neg liver ROS, GERD  Medicated,  Endo/Other  negative endocrine ROS  Renal/GU negative Renal ROS  negative genitourinary   Musculoskeletal   Abdominal   Peds  Hematology negative hematology ROS (+)   Anesthesia Other Findings Past Medical History: No date: Asthma No date: Pulmonary embolism [facility]) Past Surgical History: 2006: BREAST BIOPSY; Left     Comment:  benign/clip 2015: BREAST BIOPSY; Right     Comment:  benign/clip No date: BREAST REDUCTION SURGERY No date: CESAREAN SECTION No date: CESAREAN SECTION No date: CHOLECYSTECTOMY No date: COLONOSCOPY 10/31/2018: COLONOSCOPY WITH PROPOFOL; N/A     Comment:  Procedure: COLONOSCOPY WITH PROPOFOL;  Surgeon: [name], MD;  Location: [facility] ENDOSCOPY;  Service:               Gastroenterology;  Laterality: N/A; 10/04/2016: ESOPHAGOGASTRODUODENOSCOPY (EGD) WITH PROPOFOL; N/A     Comment:  Procedure: ESOPHAGOGASTRODUODENOSCOPY (EGD) WITH               PROPOFOL;  Surgeon: [name], MD;  Location: [facility]               ENDOSCOPY;  Service: Gastroenterology;  Laterality: N/A; 04/14/2017: ESOPHAGOGASTRODUODENOSCOPY (EGD) WITH PROPOFOL; N/A     Comment:  Procedure: ESOPHAGOGASTRODUODENOSCOPY (EGD) WITH               PROPOFOL;  Surgeon: [name],  MD;  Location: [facility]               ENDOSCOPY;  Service: Gastroenterology;  Laterality: N/A; 10/31/2018: ESOPHAGOGASTRODUODENOSCOPY (EGD) WITH PROPOFOL; N/A     Comment:  Procedure: ESOPHAGOGASTRODUODENOSCOPY (EGD) WITH               PROPOFOL;  Surgeon: [name], MD;  Location: [facility]               ENDOSCOPY;  Service: Gastroenterology;  Laterality: N/A; No date: GASTRIC BYPASS     Comment:  01/2015 No date: KNEE SURGERY No date: NASAL SINUS SURGERY No date: PLANTAR FASCIA SURGERY     Comment:  x 3 2005: REDUCTION MAMMAPLASTY; Bilateral   Reproductive/Obstetrics negative OB ROS                             Anesthesia Physical Anesthesia Plan  ASA: III  Anesthesia Plan: General   Post-op Pain Management:    Induction:   PONV Risk Score and Plan:   Airway Management Planned:   Additional Equipment:   Intra-op Plan:   Post-operative Plan:   Informed Consent: I have reviewed the patients History and Physical, chart, labs and discussed the procedure including the risks, benefits and alternatives for the proposed anesthesia with the patient or authorized representative who has indicated his/her understanding  and acceptance.     Dental Advisory Given  Plan Discussed with: CRNA  Anesthesia Plan Comments:         Anesthesia Quick Evaluation

## 2019-06-28 NOTE — Transfer of Care (Signed)
Immediate Anesthesia Transfer of Care Note  Patient: Debbie Sosa  Procedure(s) Performed: ESOPHAGOGASTRODUODENOSCOPY (EGD) WITH PROPOFOL (N/A )  Patient Location: PACU and Endoscopy Unit  Anesthesia Type:General  Level of Consciousness: drowsy  Airway & Oxygen Therapy: Patient Spontanous Breathing  Post-op Assessment: Report given to RN and Post -op Vital signs reviewed and stable  Post vital signs: Reviewed and stable  Last Vitals:  Vitals Value Taken Time  BP 111/71 06/28/19 1016  Temp    Pulse 84 06/28/19 1017  Resp 22 06/28/19 1017  SpO2 96 % 06/28/19 1017  Vitals shown include unvalidated device data.  Last Pain:  Vitals:   06/28/19 0908  TempSrc: Temporal  PainSc: 0-No pain         Complications: No complications documented.

## 2019-06-28 NOTE — H&P (Signed)
[name], MD [address], [place], [place], [place], [address] [id number], [place], [place], [place], [address] Phone: [phone]  Fax: [phone]  Primary Care Physician:  Elgie Collard, MD   Pre-Procedure History & Physical: HPI:  Debbie Sosa is a 44 y.o. female is here for an endoscopy    Past Medical History:  Diagnosis Date  . Asthma   . Pulmonary embolism [facility])     Past Surgical History:  Procedure Laterality Date  . BREAST BIOPSY Left 2006   benign/clip  . BREAST BIOPSY Right 2015   benign/clip  . BREAST REDUCTION SURGERY    . CESAREAN SECTION    . CESAREAN SECTION    . CHOLECYSTECTOMY    . COLONOSCOPY    . COLONOSCOPY WITH PROPOFOL N/A 10/31/2018   Procedure: COLONOSCOPY WITH PROPOFOL;  Surgeon: [name], MD;  Location: [facility] ENDOSCOPY;  Service: Gastroenterology;  Laterality: N/A;  . ESOPHAGOGASTRODUODENOSCOPY (EGD) WITH PROPOFOL N/A 10/04/2016   Procedure: ESOPHAGOGASTRODUODENOSCOPY (EGD) WITH PROPOFOL;  Surgeon: [name], MD;  Location: [facility] ENDOSCOPY;  Service: Gastroenterology;  Laterality: N/A;  . ESOPHAGOGASTRODUODENOSCOPY (EGD) WITH PROPOFOL N/A 04/14/2017   Procedure: ESOPHAGOGASTRODUODENOSCOPY (EGD) WITH PROPOFOL;  Surgeon: [name], MD;  Location: [facility] ENDOSCOPY;  Service: Gastroenterology;  Laterality: N/A;  . ESOPHAGOGASTRODUODENOSCOPY (EGD) WITH PROPOFOL N/A 10/31/2018   Procedure: ESOPHAGOGASTRODUODENOSCOPY (EGD) WITH PROPOFOL;  Surgeon: [name], MD;  Location: [facility] ENDOSCOPY;  Service: Gastroenterology;  Laterality: N/A;  . GASTRIC BYPASS     01/2015  . KNEE SURGERY    . NASAL SINUS SURGERY    . PLANTAR FASCIA SURGERY     x 3  . REDUCTION MAMMAPLASTY Bilateral 2005    Prior to Admission medications   Medication Sig Start Date End Date Taking? Authorizing Provider  albuterol (VENTOLIN HFA) 108 (90 Base) MCG/ACT inhaler Inhale 1-2 puffs INH Q4-6hr prn for chest tightness, cough, wheezing, SOB/DOE. 01/10/19   Yes Jaynee Eagles, PA-C  benzonatate (TESSALON) 100 MG capsule Take 1-2 capsules (100-200 mg total) by mouth 3 (three) times daily as needed. 03/25/19  Yes Fenton Foy, NP  EPINEPHrine (EPIPEN 2-PAK) 0.3 mg/0.3 mL IJ SOAJ injection Inject 0.3 mLs (0.3 mg total) into the muscle as needed for anaphylaxis. 04/16/19  Yes Spero Geralds, MD  fluticasone (FLONASE) 50 MCG/ACT nasal spray once as needed.  05/08/14  Yes [provider]  fluticasone-salmeterol (ADVAIR HFA) 115-21 MCG/ACT inhaler Inhale 2 puffs into the lungs 2 (two) times daily. 04/16/19  Yes Spero Geralds, MD  montelukast (SINGULAIR) 10 MG tablet Take 10 mg by mouth daily.  08/20/15  Yes [provider]  [place] daily as needed. Allergy eye drops   Yes [provider]  pantoprazole (PROTONIX) 40 MG tablet Take 1 tablet (40 mg total) by mouth daily. 04/16/19 10/13/19 Yes Spero Geralds, MD  sucralfate (CARAFATE) 1 g tablet Take by mouth. 10/14/15  Yes [provider]  Vitamin D, Ergocalciferol, (DRISDOL) 1.25 MG (50000 UT) CAPS capsule Take 50,000 Units by mouth once a week. 11/28/18  Yes [provider]    Allergies as of 05/16/2019 - Review Complete 04/16/2019  Allergen Reaction Noted  . Cephalexin Hives and Rash 03/12/2014  . Fexofenadine Shortness Of Breath   . Ketorolac Hives 03/12/2014  . Morphine Anaphylaxis 07/01/2013  . Phenazopyridine Rash   . Promethazine Hives 03/12/2014  . Cetirizine Other (See Comments) 03/05/2018  . Contrast media [iodinated diagnostic agents] Hives 05/23/2016  . Latex  06/18/2014  . Morphine and related  06/18/2014  . Other Hives 02/23/2013  . Pyridium [phenazopyridine hcl]  06/18/2014  . Tramadol Hives 06/18/2014  . Vioxx [rofecoxib]  06/18/2014  . Hepatitis b virus vaccines Rash 07/01/2013  . Naproxen Rash 07/01/2013  . Oxycodone-acetaminophen Itching 03/12/2014  . Sulfa antibiotics Rash 06/18/2014    Family History  Problem  Relation Age of Onset  . Breast cancer Mother 26  . Breast cancer Cousin 30  . Breast cancer Cousin 53  . Asthma Father   . Asthma Sister   . Asthma Child     Social History   Socioeconomic History  . Marital status: Single    Spouse name: Not on file  . Number of children: Not on file  . Years of education: Not on file  . Highest education level: Not on file  Occupational History  . Not on file  Tobacco Use  . Smoking status: Never Smoker  . Smokeless tobacco: Never Used  Vaping Use  . Vaping Use: Never used  Substance and Sexual Activity  . Alcohol use: No  . Drug use: No  . Sexual activity: Not on file  Other Topics Concern  . Not on file  Social History Narrative  . Not on file   Social Determinants of Health   Financial Resource Strain:   . Difficulty of Paying Living Expenses:   Food Insecurity:   . Worried About Charity fundraiser in the Last Year:   . Arboriculturist in the Last Year:   Transportation Needs:   . Film/video editor (Medical):   Marland Kitchen Lack of Transportation (Non-Medical):   Physical Activity:   . Days of Exercise per Week:   . Minutes of Exercise per Session:   Stress:   . Feeling of Stress :   Social Connections:   . Frequency of Communication with Friends and Family:   . Frequency of Social Gatherings with Friends and Family:   . Attends Religious Services:   . Active Member of Clubs or Organizations:   . Attends Archivist Meetings:   Marland Kitchen Marital Status:   Intimate Partner Violence:   . Fear of Current or Ex-Partner:   . Emotionally Abused:   Marland Kitchen Physically Abused:   . Sexually Abused:     Review of Systems: See HPI, otherwise negative ROS  Physical Exam: BP 95/76   Pulse 75   Temp 97.7 F (36.5 C) (Temporal)   Resp 17   Ht 5\' 1"  (1.549 m)   Wt 92.5 kg   SpO2 99%   BMI 38.55 kg/m  General:   Alert,  pleasant and cooperative in NAD Head:  Normocephalic and atraumatic. Neck:  Supple; no masses or  thyromegaly. Lungs:  Clear throughout to auscultation, normal respiratory effort.    Heart:  +S1, +S2, Regular rate and rhythm, No edema. Abdomen:  Soft, nontender and nondistended. Normal bowel sounds, without guarding, and without rebound.   Neurologic:  Alert and  oriented x4;  grossly normal neurologically.  Impression/Plan: Debbie Sosa is here for an endoscopy  to be performed for  evaluation of gastric intestinal metaplasia    Risks, benefits, limitations, and alternatives regarding endoscopy have been reviewed with the patient.  Questions have been answered.  All parties agreeable.   [name], MD  06/28/2019, 9:48 AM

## 2019-06-28 NOTE — Op Note (Signed)
[facility] Gastroenterology Patient Name: Debbie Sosa Procedure Date: 06/28/2019 9:52 AM MRN: [id number] Account #: [id number] Date of Birth: 1975/06/26 Admit Type: Outpatient Age: 44 Room: [facility] ENDO ROOM 2 Gender: Female Note Status: Finalized Procedure:             Upper GI endoscopy Indications:           Follow-up of intestinal metaplasia Providers:             [name] MD, MD Medicines:             Monitored Anesthesia Care Complications:         No immediate complications. Procedure:             Pre-Anesthesia Assessment:                        - Prior to the procedure, a History and Physical was                         performed, and patient medications, allergies and                         sensitivities were reviewed. The patient's tolerance                         of previous anesthesia was reviewed.                        - The risks and benefits of the procedure and the                         sedation options and risks were discussed with the                         patient. All questions were answered and informed                         consent was obtained.                        - ASA Grade Assessment: III - A patient with severe                         systemic disease.                        After obtaining informed consent, the endoscope was                         passed under direct vision. Throughout the procedure,                         the patient's blood pressure, pulse, and oxygen                         saturations were monitored continuously. The Endoscope                         was introduced through the mouth, and advanced to the  jejunum. The upper GI endoscopy was accomplished                         without difficulty. The patient tolerated the                         procedure well. Findings:      The esophagus was normal.      The examined jejunum was normal.      Evidence of a Roux-en-Y  gastrojejunostomy was found. The gastrojejunal       anastomosis was characterized by healthy appearing mucosa. This was       traversed. The pouch-to-jejunum limb was characterized by healthy       appearing mucosa. The jejunojejunal anastomosis was characterized by       healthy appearing mucosa. Biopsies were taken with a cold forceps for       histology. bx taken for gastric mapping in 3 jars      The cardia and gastric fundus were normal on retroflexion. Impression:            - Normal esophagus.                        - Normal examined jejunum.                        - Roux-en-Y gastrojejunostomy with gastrojejunal                         anastomosis characterized by healthy appearing mucosa.                         Biopsied. Recommendation:        - Discharge patient to home (with escort).                        - Resume previous diet.                        - Continue present medications.                        - Await pathology results.                        - Return to my office as previously scheduled. Procedure Code(s):     --- Professional ---                        [phone], Esophagogastroduodenoscopy, flexible,                         transoral; with biopsy, single or multiple Diagnosis Code(s):     --- Professional ---                        Z98.0, Intestinal bypass and anastomosis status                        K31.89, Other diseases of stomach and duodenum CPT copyright 2019 American Medical Association. All rights reserved. The codes documented in this report are preliminary and upon coder review may  be revised to meet current  compliance requirements. [name], MD [name] MD, MD 06/28/2019 10:13:32 AM This report has been signed electronically. Number of Addenda: 0 Note Initiated On: 06/28/2019 9:52 AM Estimated Blood Loss:  Estimated blood loss: none.      [facility]

## 2019-06-29 NOTE — OR Nursing (Signed)
dURING POSOPT CALL PATIENT STATED HER STOMACH WAS A LITTLE TENDER. tHERE IS NO FEVER ,BLOATING OR CHILLS. Pt INSTRUCTED TO CALL PHYSCIAN ON CALL IF SHE HAS WORSENING ABD PAIN ,FEVER CHILLS OR  ANY OTHER PROBLEM. Dr Chuck Hint WAS BESIDE ME AND I TOLD HER PATIENT MIGHT CALL .

## 2019-07-01 ENCOUNTER — Encounter: Payer: Self-pay | Admitting: Gastroenterology

## 2019-07-02 LAB — SURGICAL PATHOLOGY

## 2019-07-04 NOTE — Anesthesia Postprocedure Evaluation (Signed)
Anesthesia Post Note  Patient: Debbie Sosa  Procedure(s) Performed: ESOPHAGOGASTRODUODENOSCOPY (EGD) WITH PROPOFOL (N/A )  Patient location during evaluation: PACU Anesthesia Type: General Level of consciousness: awake and alert Pain management: pain level controlled Vital Signs Assessment: post-procedure vital signs reviewed and stable Respiratory status: spontaneous breathing, nonlabored ventilation, respiratory function stable and patient connected to nasal cannula oxygen Cardiovascular status: blood pressure returned to baseline and stable Postop Assessment: no apparent nausea or vomiting Anesthetic complications: no   No complications documented.   Last Vitals:  Vitals:   06/28/19 1040 06/28/19 1047  BP: 118/82 109/83  Pulse: 67 70  Resp: 18 17  Temp:    SpO2: 95% 100%    Last Pain:  Vitals:   06/28/19 1016  TempSrc: Tympanic  PainSc:                  Molli Barrows

## 2019-07-17 ENCOUNTER — Telehealth: Payer: Self-pay

## 2019-07-17 NOTE — Telephone Encounter (Signed)
Pt left vm requesting biopsy results. 

## 2019-07-18 NOTE — Telephone Encounter (Signed)
Shows gastric intestinal metaplasia like seen previously no cancer- repeat EGD in 3 years

## 2019-09-19 DIAGNOSIS — M0579 Rheumatoid arthritis with rheumatoid factor of multiple sites without organ or systems involvement: Secondary | ICD-10-CM | POA: Insufficient documentation

## 2019-09-19 DIAGNOSIS — R760 Raised antibody titer: Secondary | ICD-10-CM | POA: Insufficient documentation

## 2019-09-30 ENCOUNTER — Other Ambulatory Visit: Payer: Self-pay

## 2019-09-30 ENCOUNTER — Ambulatory Visit: Payer: BC Managed Care – PPO | Admitting: Internal Medicine

## 2019-09-30 ENCOUNTER — Encounter: Payer: Self-pay | Admitting: Internal Medicine

## 2019-09-30 VITALS — BP 106/78 | HR 81 | Temp 98.3°F | Ht 61.0 in | Wt 201.8 lb

## 2019-09-30 DIAGNOSIS — U071 COVID-19: Secondary | ICD-10-CM

## 2019-09-30 DIAGNOSIS — J4540 Moderate persistent asthma, uncomplicated: Secondary | ICD-10-CM | POA: Diagnosis not present

## 2019-09-30 DIAGNOSIS — K219 Gastro-esophageal reflux disease without esophagitis: Secondary | ICD-10-CM

## 2019-09-30 MED ORDER — BUDESONIDE-FORMOTEROL FUMARATE 160-4.5 MCG/ACT IN AERO: 2.0000 | INHALATION_SPRAY | Freq: Two times a day (BID) | RESPIRATORY_TRACT | 12 refills | Status: DC

## 2019-09-30 NOTE — Patient Instructions (Signed)
The patient should have follow up scheduled with myself in 3 months.   We are changing you from Advair to Symbicort. Take this twice a day. Still need to rinse mouth after use to prevent thrush.

## 2019-09-30 NOTE — Progress Notes (Signed)
Debbie Sosa    [id number]    [birth year]  Primary Care Physician:Knowles-Jonas, Hulan Fray, MD Date of Appointment: 09/30/2019 Established Patient Visit  Chief complaint:   Chief Complaint  Patient presents with   Follow-up    Patient is tired all the time, states that she has been trying to walk but just can't make it very far. Shortness of breath with exertion, dry cough sometimes    HPI: Debbie Sosa is a 44 y.o. woman with moderate persistent asthma and covid infection requring hospitalization in Dec 2020. History of pulmonary embolism provoked peripartum (remote.)  Interval Updates: Using albuterol three times a week. Still tired and fatigued.  Diagnosed with rheumatoid arthritis and started on plaquenil. She went back to ENT last week and has a polyp on the right surgery. Stopped advair due to thrush.  Trying to walk in the evenings when it's cooler.   Current Regimen: albuterol prn Asthma Triggers: URI. Cold weather, hot weather. Environmental allergies.  Exacerbations in the last year: none History of hospitalization or intubation: none Allergy Testing: she did have this done at [facility] in [place], multiple grasses and trees, molds on SPT. She was getting SCIT but this was interrupted during covid.  GERD: yes, improved with wedge pillow Allergic Rhinitis: yes ACT:  Asthma Control Test ACT Total Score  09/30/2019 10  05/29/2019 11   FeNO: 32 ppb  I have reviewed the patient's family social and past medical history and updated as appropriate.   Past Medical History:  Diagnosis Date   Asthma    Pulmonary embolism [facility])     Past Surgical History:  Procedure Laterality Date   BREAST BIOPSY Left 2006   benign/clip   BREAST BIOPSY Right 2015   benign/clip   BREAST REDUCTION SURGERY     CESAREAN SECTION     CESAREAN SECTION     CHOLECYSTECTOMY     COLONOSCOPY     COLONOSCOPY WITH PROPOFOL N/A 10/31/2018   Procedure: COLONOSCOPY WITH  PROPOFOL;  Surgeon: [name], MD;  Location: [facility] ENDOSCOPY;  Service: Gastroenterology;  Laterality: N/A;   ESOPHAGOGASTRODUODENOSCOPY (EGD) WITH PROPOFOL N/A 10/04/2016   Procedure: ESOPHAGOGASTRODUODENOSCOPY (EGD) WITH PROPOFOL;  Surgeon: [name], MD;  Location: [facility] ENDOSCOPY;  Service: Gastroenterology;  Laterality: N/A;   ESOPHAGOGASTRODUODENOSCOPY (EGD) WITH PROPOFOL N/A 04/14/2017   Procedure: ESOPHAGOGASTRODUODENOSCOPY (EGD) WITH PROPOFOL;  Surgeon: [name], MD;  Location: [facility] ENDOSCOPY;  Service: Gastroenterology;  Laterality: N/A;   ESOPHAGOGASTRODUODENOSCOPY (EGD) WITH PROPOFOL N/A 10/31/2018   Procedure: ESOPHAGOGASTRODUODENOSCOPY (EGD) WITH PROPOFOL;  Surgeon: [name], MD;  Location: [facility] ENDOSCOPY;  Service: Gastroenterology;  Laterality: N/A;   ESOPHAGOGASTRODUODENOSCOPY (EGD) WITH PROPOFOL N/A 06/28/2019   Procedure: ESOPHAGOGASTRODUODENOSCOPY (EGD) WITH PROPOFOL;  Surgeon: [name], MD;  Location: [facility] ENDOSCOPY;  Service: Gastroenterology;  Laterality: N/A;   GASTRIC BYPASS     01/2015   KNEE SURGERY     NASAL SINUS SURGERY     PLANTAR FASCIA SURGERY     x 3   REDUCTION MAMMAPLASTY Bilateral 2005    Family History  Problem Relation Age of Onset   Breast cancer Mother 61   Breast cancer Cousin 52   Breast cancer Cousin 52   Asthma Father    Asthma Sister    Asthma Child     Social History   Occupational History   Not on file  Tobacco Use   Smoking status: Never Smoker   Smokeless tobacco: Never Used  Vaping Use   Vaping Use: Never used  Substance and Sexual Activity   Alcohol use: No   Drug use: No   Sexual activity: Not on file     Physical Exam: Blood pressure 106/78, pulse 81, temperature 98.3 F (36.8 C), temperature source Temporal, height 5\' 1"  (1.549 m), weight 201 lb 12.8 oz (91.5 kg), SpO2 96 %.  Gen:      No acute distress, fatigued Lungs:    No increased respiratory effort, symmetric chest wall  excursion, clear to auscultation bilaterally, no wheezes or crackles CV:         Regular rate and rhythm; no murmurs, rubs, or gallops.  No pedal edema   Data Reviewed: Imaging: I have personally reviewed the chest xray March 2021 no acute cardiopulmonary process.   PFTs: I have personally reviewed the patient's PFTs and normal spirometry, FeNO elevated at 32 ppb  Labs: Lab Results  Component Value Date   WBC 6.2 01/18/2019   HGB 11.2 (L) 01/18/2019   HCT 33.1 (L) 01/18/2019   MCV 83.8 01/18/2019   PLT 330 01/18/2019    Immunization status: Immunization History  Administered Date(s) Administered   Influenza,inj,Quad PF,6+ Mos 10/21/2018   PFIZER SARS-COV-2 Vaccination 04/18/2019, 05/08/2019   Pneumococcal Polysaccharide-23 01/16/2019   Assessment:  Moderate Persistent asthma Allergic rhinitis History of covid 19 infection GERD with history of gastric bypass. improved with wedge pillow New diagnosis rheumatoid arthritis.   Plan/Recommendations: Continue PPI and wedge pillow.  Change from advair to symbicort due to thrush. 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 May take time to improve. Exercise as tolerated She hasn't been able to make it to resume SCIT therapy because of her work scheduled. Will obtain records from Dr. Ephriam Jenkins in Rheumatology in [place] regarding RA diagnosis and treatment (labs, progress notes.)  Return to Care: Return in about 3 months (around 12/30/2019).   Lenice Llamas, MD Pulmonary and [place]

## 2019-11-28 ENCOUNTER — Other Ambulatory Visit: Payer: Self-pay | Admitting: Obstetrics and Gynecology

## 2019-11-28 DIAGNOSIS — Z1231 Encounter for screening mammogram for malignant neoplasm of breast: Secondary | ICD-10-CM

## 2019-12-05 ENCOUNTER — Other Ambulatory Visit: Payer: Self-pay

## 2019-12-05 ENCOUNTER — Telehealth: Payer: Self-pay | Admitting: Gastroenterology

## 2019-12-05 DIAGNOSIS — K921 Melena: Secondary | ICD-10-CM

## 2019-12-05 NOTE — Telephone Encounter (Signed)
Patient states she has been having dark diarrhea for the last 3 days with a pain in the middle of her stomach. Patient states anything she eats goes through her, so she can not hold anything down. Patient is a Pharmacist, hospital and wanting to know if there is anything Dr. Vicente Males suggest for her. Please advise.

## 2019-12-05 NOTE — Telephone Encounter (Signed)
Jadijah   Inform   1. Enquire if on NSAID's- if yes stop 2. Enquire if taking a PPI or Pepto bismol? 3. Check CBC 4. IF pain worse needs to be seen by urgent care or PCP +/- imaging as am not in next week

## 2019-12-05 NOTE — Telephone Encounter (Signed)
Pt has been notified of Dr. Georgeann Oppenheim recommendations and agrees to visit the lab. Pt denies taking any NSAIDs or PPI. Pt states she had a small dose of Pepto bismol.

## 2019-12-16 ENCOUNTER — Ambulatory Visit
Admission: EM | Admit: 2019-12-16 | Discharge: 2019-12-16 | Disposition: A | Discharge status: Home / Self Care | Payer: BC Managed Care – PPO | Attending: Emergency Medicine | Admitting: Emergency Medicine

## 2019-12-16 ENCOUNTER — Other Ambulatory Visit: Payer: Self-pay

## 2019-12-16 ENCOUNTER — Ambulatory Visit (INDEPENDENT_AMBULATORY_CARE_PROVIDER_SITE_OTHER): Payer: BC Managed Care – PPO

## 2019-12-16 DIAGNOSIS — M25552 Pain in left hip: Secondary | ICD-10-CM

## 2019-12-16 DIAGNOSIS — S79912A Unspecified injury of left hip, initial encounter: Secondary | ICD-10-CM

## 2019-12-16 DIAGNOSIS — M25561 Pain in right knee: Secondary | ICD-10-CM

## 2019-12-16 DIAGNOSIS — W19XXXA Unspecified fall, initial encounter: Secondary | ICD-10-CM | POA: Diagnosis not present

## 2019-12-16 DIAGNOSIS — S8001XA Contusion of right knee, initial encounter: Secondary | ICD-10-CM

## 2019-12-16 DIAGNOSIS — S7002XA Contusion of left hip, initial encounter: Secondary | ICD-10-CM

## 2019-12-16 NOTE — Discharge Instructions (Addendum)
Your knee and hip x-rays are negative for fracture, dislocation.  You have some mild arthritis in your knee, back, hips and pelvis, which is normal for age.  Try 1000 mg of Tylenol 3-4 times a day, Voltaren gel, which she can get over-the-counter, and ice for the first 72 hours followed by heat.  Follow-up with [facility] if still having problems in 2 weeks.

## 2019-12-16 NOTE — ED Provider Notes (Signed)
HPI  SUBJECTIVE:  Debbie Sosa is a 44 y.o. female who presents with right patellar pain, bruising and left hip pain after falling off of the second rung of a stepladder last night in her hallway.  States she was putting away Christmas decorations, fell, hit the top of her right knee on the wall and her hip directly onto the ground.  She ports constant soreness, but is able to walk on both her hip and knee.  She reports bruising, swelling of her right knee.  Is not giving way, no distal numbness or tingling.  No injury to the right hip or ankle.  She is able to weight-bear on her left hip.  No bruising, swelling.  She reports pain with abduction, but she is able to move her hip through full range of motion.  States her left knee is without injury.  No distal numbness or tingling.  She has not tried anything for her symptoms.  Her right knee is worse with palpation and bending, hip is worse with abduction, lying on it with walking.  She is status post gastric bypass and has a history of injury to the medial ligaments of the right knee status post arthroscopy.  No history of diabetes, hypertension, osteoporosis, chronic kidney disease.  She does have rheumatoid arthritis but is not on any steroids.  She is on Plaquenil.  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  Denies the possibility being pregnant.  KNL:[id number], Hulan Fray, MD   Past Medical History:  Diagnosis Date  . Asthma   . Pulmonary embolism [facility])     Past Surgical History:  Procedure Laterality Date  . BREAST BIOPSY Left 2006   benign/clip  . BREAST BIOPSY Right 2015   benign/clip  . BREAST REDUCTION SURGERY    . CESAREAN SECTION    . CESAREAN SECTION    . CHOLECYSTECTOMY    . COLONOSCOPY    . COLONOSCOPY WITH PROPOFOL N/A 10/31/2018   Procedure: COLONOSCOPY WITH PROPOFOL;  Surgeon: [name], MD;  Location: [facility] ENDOSCOPY;  Service: Gastroenterology;  Laterality: N/A;  . ESOPHAGOGASTRODUODENOSCOPY (EGD) WITH PROPOFOL N/A 10/04/2016    Procedure: ESOPHAGOGASTRODUODENOSCOPY (EGD) WITH PROPOFOL;  Surgeon: [name], MD;  Location: [facility] ENDOSCOPY;  Service: Gastroenterology;  Laterality: N/A;  . ESOPHAGOGASTRODUODENOSCOPY (EGD) WITH PROPOFOL N/A 04/14/2017   Procedure: ESOPHAGOGASTRODUODENOSCOPY (EGD) WITH PROPOFOL;  Surgeon: [name], MD;  Location: [facility] ENDOSCOPY;  Service: Gastroenterology;  Laterality: N/A;  . ESOPHAGOGASTRODUODENOSCOPY (EGD) WITH PROPOFOL N/A 10/31/2018   Procedure: ESOPHAGOGASTRODUODENOSCOPY (EGD) WITH PROPOFOL;  Surgeon: [name], MD;  Location: [facility] ENDOSCOPY;  Service: Gastroenterology;  Laterality: N/A;  . ESOPHAGOGASTRODUODENOSCOPY (EGD) WITH PROPOFOL N/A 06/28/2019   Procedure: ESOPHAGOGASTRODUODENOSCOPY (EGD) WITH PROPOFOL;  Surgeon: [name], MD;  Location: [facility] ENDOSCOPY;  Service: Gastroenterology;  Laterality: N/A;  . GASTRIC BYPASS     01/2015  . KNEE SURGERY    . NASAL SINUS SURGERY    . PLANTAR FASCIA SURGERY     x 3  . REDUCTION MAMMAPLASTY Bilateral 2005    Family History  Problem Relation Age of Onset  . Breast cancer Mother 82  . Breast cancer Cousin 30  . Breast cancer Cousin 26  . Asthma Father   . Asthma Sister   . Asthma Child     Social History   Tobacco Use  . Smoking status: Never Smoker  . Smokeless tobacco: Never Used  Vaping Use  . Vaping Use: Never used  Substance Use Topics  . Alcohol use: No  . Drug  use: No    No current facility-administered medications for this encounter.  Current Outpatient Medications:  .  albuterol (VENTOLIN HFA) 108 (90 Base) MCG/ACT inhaler, Inhale 1-2 puffs INH Q4-6hr prn for chest tightness, cough, wheezing, SOB/DOE., Disp: 18 g, Rfl: 0 .  budesonide-formoterol (SYMBICORT) 160-4.5 MCG/ACT inhaler, Inhale 2 puffs into the lungs in the morning and at bedtime., Disp: 1 each, Rfl: 12 .  EPINEPHrine (EPIPEN 2-PAK) 0.3 mg/0.3 mL IJ SOAJ injection, Inject 0.3 mLs (0.3 mg total) into the muscle as needed for anaphylaxis., Disp: 1  each, Rfl: 1 .  fluticasone (FLONASE) 50 MCG/ACT nasal spray, once as needed. , Disp: , Rfl:  .  montelukast (SINGULAIR) 10 MG tablet, Take 10 mg by mouth daily. , Disp: , Rfl:  .  OVER THE COUNTER MEDICATION, daily as needed. Allergy eye drops, Disp: , Rfl:  .  Vitamin D, Ergocalciferol, (DRISDOL) 1.25 MG (50000 UT) CAPS capsule, Take 50,000 Units by mouth once a week., Disp: , Rfl:   Allergies  Allergen Reactions  . Cephalexin Hives and Rash  . Fexofenadine Shortness Of Breath  . Ketorolac Hives  . Morphine Anaphylaxis  . Phenazopyridine Rash  . Promethazine Hives  . Cetirizine Other (See Comments)    Skin eruptures  . Contrast Media [Iodinated Diagnostic Agents] Hives  . Latex   . Morphine And Related   . Other Hives    Pain medicine that starts with an L  . Pyridium [Phenazopyridine Hcl]   . Tramadol Hives  . Vioxx [Rofecoxib]   . Hepatitis B Virus Vaccines Rash  . Naproxen Rash  . Oxycodone-Acetaminophen Itching  . Sulfa Antibiotics Rash     ROS  As noted in HPI.   Physical Exam  BP (!) 130/96 (BP Location: Left Arm)   Pulse 92   Temp 97.9 F (36.6 C) (Oral)   Resp 15   Ht 5\' 1"  (1.549 m)   Wt 89.4 kg   SpO2 100%   BMI 37.22 kg/m   Constitutional: Well developed, well nourished, no acute distress Eyes:  EOMI, conjunctiva normal bilaterally HENT: Normocephalic, atraumatic,mucus membranes moist Respiratory: Normal inspiratory effort Cardiovascular: Normal rate GI: nondistended skin: No rash, skin intact Musculoskeletal: No signs of trauma L hip. No bruising, erythema, rash.  Exquisite tenderness over the greater trochanter.  No tenderness over gluteal muscles, down IT band, tenderness over quadriceps. No pain with passive abduction/adduction of leg. No pain with int/ext rotation hip. No tenderness at sciatic notch.  Pain with hip flexion.  Flexion/extension knee WNL. Knee joint NT, stable. Motor strength flexion/ext hip 5/5. Sensation to LT intact. DP  2+  R Knee:  Tender bruise superior and lateral to patella.   ROM baseline for Pt , Flexion  intact ,  Patellar tendon NT, Medial joint  Tender-patient states this is not new.  Lateral joint tender, Popliteal region NT, Varus MCL stress testing stable, Valgus LCL stress testing stable, McMurray's testing abnormal, Lachman's negative. Distal NVI with intact baseline sensation / motor / pulse distal to knee.   No erythema. No increased temperature. No crepitus.  Neurologic: Alert & oriented x 3, no focal neuro deficits Psychiatric: Speech and behavior appropriate   ED Course   Medications - No data to display  Orders Placed This Encounter  Procedures  . DG Hip Unilat With Pelvis 2-3 Views Left    R/o trochanter fx    Standing Status:   Standing    Number of Occurrences:   1  Order Specific Question:   Symptom/Reason for Exam    Answer:   Left hip pain [[id number]]  . DG Knee AP/LAT W/Sunrise Right    Standing Status:   Standing    Number of Occurrences:   1    Order Specific Question:   Reason for Exam (SYMPTOM  OR DIAGNOSIS REQUIRED)    Answer:   fall patellar pain, r/o fx    No results found for this or any previous visit (from the past 75 hour(s)). DG Hip Unilat With Pelvis 2-3 Views Left  Result Date: 12/16/2019 CLINICAL DATA:  [place] off stool yesterday EXAM: DG HIP (WITH OR WITHOUT PELVIS) 2-3V LEFT COMPARISON:  CT 12/26/2018 FINDINGS: Stable corticated seen adjacent the greater tuberosity may be enthesopathic or degenerative versus remote posttraumatic. No acute fracture or traumatic osseous injury is identified. Bones of the pelvis are intact and congruent. Femoral heads are normally located. Additional degenerative changes noted in the lower lumbar spine, hips and pelvis. Radiopaque IUD projecting over the pelvis with additional surgical clips in the right hemipelvis and multiple rounded calcifications, likely phleboliths. Remaining soft tissues are unremarkable. IMPRESSION: 1. No  acute fracture or traumatic osseous injury. 2. Degenerative changes in the lower lumbar spine, hips, pelvis. Electronically Signed   By: Lovena Le M.D.   On: 12/16/2019 19:52   DG Knee AP/LAT W/Sunrise Right  Result Date: 12/16/2019 CLINICAL DATA:  Fall, patellar pain EXAM: RIGHT KNEE 3 VIEWS COMPARISON:  Radiograph 05/11/2018 FINDINGS: Minimal soft tissue swelling anteriorly. No prepatellar bursal distension. No sizeable joint effusion. No acute bony abnormality. Specifically, no fracture, subluxation, or dislocation. Mild tricompartmental degenerative changes of the knee. Remaining soft tissues are unremarkable. IMPRESSION: Minimal swelling anteriorly. No acute fracture or traumatic malalignment. Electronically Signed   By: Lovena Le M.D.   On: 12/16/2019 19:53    ED Clinical Impression  1. Contusion of right knee, initial encounter   2. Left hip pain   3. Contusion of left hip, initial encounter   4. Fall, initial encounter      ED Assessment/Plan  Imaging right knee to rule out patellar fracture, left hip to rule out trochanter fracture.  She is weightbearing.  Plan to send home with ice, heat, 1000 mg Tylenol 3-4 times a day as needed, Voltaren gel.  Patient states that she has tolerated this before without any problem.  Follow-up with orthopedics as needed.  Reviewed imaging independently.  Right knee: Anterior mild swelling.  No fracture including patella fracture, joint effusion, dislocation.   Pelvis/hip: No fracture left hip.  See radiology report for full details.  X-rays unremarkable except for some mild arthritis of the knee, spine, pelvis, hips.  Plan as above.  Discussed  imaging, MDM, treatment plan, and plan for follow-up with patient. patient agrees with plan.   No orders of the defined types were placed in this encounter.   *This clinic note was created using Dragon dictation software. Therefore, there may be occasional mistakes despite careful  proofreading.   ?    Melynda Ripple, MD 12/17/19 [phone]

## 2019-12-16 NOTE — ED Triage Notes (Signed)
Patient states that she fell last night backwards on to the ground from her step stool. States that she landed on her left hip and her right knee. States that area have been bothering her throughout the day.

## 2020-01-01 ENCOUNTER — Other Ambulatory Visit: Payer: Self-pay

## 2020-01-01 ENCOUNTER — Ambulatory Visit
Admission: RE | Admit: 2020-01-01 | Discharge: 2020-01-01 | Disposition: A | Discharge status: Home / Self Care | Payer: BC Managed Care – PPO | Source: Ambulatory Visit | Attending: Obstetrics and Gynecology | Admitting: Obstetrics and Gynecology

## 2020-01-01 DIAGNOSIS — Z1231 Encounter for screening mammogram for malignant neoplasm of breast: Secondary | ICD-10-CM | POA: Diagnosis not present

## 2020-01-09 ENCOUNTER — Other Ambulatory Visit: Payer: Self-pay | Admitting: Neurology

## 2020-01-09 ENCOUNTER — Other Ambulatory Visit (HOSPITAL_COMMUNITY): Payer: Self-pay | Admitting: Neurology

## 2020-01-09 DIAGNOSIS — M542 Cervicalgia: Secondary | ICD-10-CM

## 2020-01-09 DIAGNOSIS — R202 Paresthesia of skin: Secondary | ICD-10-CM

## 2020-01-09 DIAGNOSIS — M25511 Pain in right shoulder: Secondary | ICD-10-CM

## 2020-01-09 DIAGNOSIS — M79601 Pain in right arm: Secondary | ICD-10-CM

## 2020-01-18 DIAGNOSIS — C8590 Non-Hodgkin lymphoma, unspecified, unspecified site: Secondary | ICD-10-CM

## 2020-01-18 HISTORY — DX: Non-Hodgkin lymphoma, unspecified, unspecified site: C85.90

## 2020-01-20 ENCOUNTER — Ambulatory Visit
Admission: EM | Admit: 2020-01-20 | Discharge: 2020-01-20 | Disposition: A | Discharge status: Home / Self Care | Payer: BC Managed Care – PPO | Attending: Sports Medicine | Admitting: Sports Medicine

## 2020-01-20 ENCOUNTER — Other Ambulatory Visit: Payer: Self-pay

## 2020-01-20 DIAGNOSIS — M791 Myalgia, unspecified site: Secondary | ICD-10-CM | POA: Diagnosis present

## 2020-01-20 DIAGNOSIS — M2559 Pain in other specified joint: Secondary | ICD-10-CM

## 2020-01-20 DIAGNOSIS — R519 Headache, unspecified: Secondary | ICD-10-CM | POA: Diagnosis present

## 2020-01-20 DIAGNOSIS — U071 COVID-19: Secondary | ICD-10-CM | POA: Diagnosis not present

## 2020-01-20 DIAGNOSIS — R0789 Other chest pain: Secondary | ICD-10-CM | POA: Diagnosis present

## 2020-01-20 NOTE — Discharge Instructions (Addendum)
EKG was within normal limits without any acute ST or T wave changes. Lung exam was normal so no need to get a chest x-ray. Will go ahead and test her for Covid and influenza.  We do not have any rapid test at the present time and we will send it to the hospital.  It will take 24 to 48 hours before her results are available.  She should quarantine until she gets her results.  We will give her instructions on quarantine. Gave her a work note keeping her out of work until she has her results back. Plenty rest, plenty of fluids, Tylenol or Motrin for fever or discomfort.  She can also use over-the-counter meds as needed. If she develops chest pain shortness of breath or anything that requires immediate medical attention she should call 911 or seek out a higher level of care at an emergency room setting.  She voiced verbal understanding.  She will follow up with [place] as needed.

## 2020-01-20 NOTE — ED Triage Notes (Incomplete)
Pt SOB, mid and left sternal CP lightheadedness onset yesterday, n/v at 0300 today with "cold" in emesis. Characterizes CP as pressure/tight, increases with inspiration.  CP increases with movement of bilateral arms, and palpation.  Reports h/o blood clots (PE), COVID infection 12/20.  Also reports sore throat, joint pain/body aches, yellow sputum with sneezing.  Denies fever, congestion, runny nose.  Took symbicort inhaler at lunch time today.  No aspirin, pain meds taken for symptoms. Lungs CTA bilaterally. EKG performed and given to provider.

## 2020-01-20 NOTE — ED Provider Notes (Signed)
[facility]    CSN: [id number] Arrival date & time: 01/20/20  1828      History   Chief Complaint Chief Complaint  Patient presents with  . Chest Pain  . Shortness of Breath    HPI Debbie Sosa is a 45 y.o. female.   Patient pleasant 45 year old female who presents for evaluation of chest tightness, headache, and joint pain since last evening.  She denies any shortness of breath.  No fevers or shakes.  Possibly some chills.  No nausea or vomiting or diarrhea.  She has not had any Covid exposure.  She has been vaccinated x2 as well as received the booster.  She is also been vaccinated against influenza.  She did have Covid back in December 2020 and her symptoms have resolved.  She was hospitalized and received remdesivir treatment as an inpatient.  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  Classes were canceled today due to the weather.  No red flag signs or symptoms offered by the patient.     Past Medical History:  Diagnosis Date  . Asthma   . Pulmonary embolism [facility])     Patient Active Problem List   Diagnosis Date Noted  . Shortness of breath 03/25/2019  . History of COVID-19 03/25/2019  . Chronic cough 03/25/2019  . Hypoxia 01/13/2019  . Pneumonia due to COVID-19 virus 01/13/2019  . Personal history of pulmonary embolism 01/13/2019  . Bilateral leg pain 01/13/2019  . Acute laryngitis 08/17/2016  . Dysphagia 08/17/2016  . Meningitis 08/17/2016  . 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 Diarrhea, unspecified 10/14/2015  . Left lower quadrant pain 10/14/2015  . Nausea and vomiting 10/14/2015  . 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  . 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 Achalasia 01/07/2015  . Central sleep apnea 01/07/2015  . Obesity (BMI 30-39.9) 01/07/2015  . GERD (gastroesophageal reflux disease) 12/25/2014  . Pulmonary embolism ([place])   . Plantar fasciitis of right foot 12/31/2013    Past Surgical History:   Procedure Laterality Date  . BREAST BIOPSY Left 2006   benign/clip  . BREAST BIOPSY Right 2015   benign/clip  . BREAST REDUCTION SURGERY    . CESAREAN SECTION    . CESAREAN SECTION    . CHOLECYSTECTOMY    . COLONOSCOPY    . COLONOSCOPY WITH PROPOFOL N/A 10/31/2018   Procedure: COLONOSCOPY WITH PROPOFOL;  Surgeon: [name], MD;  Location: [facility] ENDOSCOPY;  Service: Gastroenterology;  Laterality: N/A;  . ESOPHAGOGASTRODUODENOSCOPY (EGD) WITH PROPOFOL N/A 10/04/2016   Procedure: ESOPHAGOGASTRODUODENOSCOPY (EGD) WITH PROPOFOL;  Surgeon: [name], MD;  Location[facility] ENDOSCOPY;  Service: Gastroenterology;  Laterality: N/A;  . ESOPHAGOGASTRODUODENOSCOPY (EGD) WITH PROPOFOL N/A 04/14/2017   Procedure: ESOPHAGOGASTRODUODENOSCOPY (EGD) WITH PROPOFOL;  Surgeon: [name], MD;  Location: [facility] ENDOSCOPY;  Service: Gastroenterology;  Laterality: N/A;  . ESOPHAGOGASTRODUODENOSCOPY (EGD) WITH PROPOFOL N/A 10/31/2018   Procedure: ESOPHAGOGASTRODUODENOSCOPY (EGD) WITH PROPOFOL;  Surgeon: [name], MD;  Location: [facility] ENDOSCOPY;  Service: Gastroenterology;  Laterality: N/A;  . ESOPHAGOGASTRODUODENOSCOPY (EGD) WITH PROPOFOL N/A 06/28/2019   Procedure: ESOPHAGOGASTRODUODENOSCOPY (EGD) WITH PROPOFOL;  Surgeon: [name], MD;  Location: [facility] ENDOSCOPY;  Service: Gastroenterology;  Laterality: N/A;  . GASTRIC BYPASS     01/2015  . KNEE SURGERY    . NASAL SINUS SURGERY    . PLANTAR FASCIA SURGERY     x 3  . REDUCTION MAMMAPLASTY Bilateral 2005    OB History   No obstetric history  on file.      Home Medications    Prior to Admission medications   Medication Sig Start Date End Date Taking? Authorizing Provider  budesonide-formoterol (SYMBICORT) 160-4.5 MCG/ACT inhaler Inhale 2 puffs into the lungs in the morning and at bedtime. 09/30/19  Yes Spero Geralds, MD  hydroxychloroquine (PLAQUENIL) 200 MG tablet Take 200 mg by mouth 2 (two) times daily. 01/06/20  Yes [provider]   montelukast (SINGULAIR) 10 MG tablet Take 10 mg by mouth daily.  08/20/15  Yes [provider]  predniSONE (DELTASONE) 10 MG tablet Take 60mg  day 1, 50mg  day 2, 40mg  day 3, 30mg  day 4, 20mg  day 5, 10mg  day 6, then stop 01/09/20  Yes [provider]  albuterol (VENTOLIN HFA) 108 (90 Base) MCG/ACT inhaler Inhale 1-2 puffs INH Q4-6hr prn for chest tightness, cough, wheezing, SOB/DOE. 01/10/19   Jaynee Eagles, PA-C  EPINEPHrine (EPIPEN 2-PAK) 0.3 mg/0.3 mL IJ SOAJ injection Inject 0.3 mLs (0.3 mg total) into the muscle as needed for anaphylaxis. 04/16/19   Spero Geralds, MD  fluticasone (FLONASE) 50 MCG/ACT nasal spray once as needed.  05/08/14   [provider]  gabapentin (NEURONTIN) 300 MG capsule Take 300 mg by mouth daily. 01/09/20   [provider]  OVER THE COUNTER MEDICATION daily as needed. Allergy eye drops    [provider]  Vitamin D, Ergocalciferol, (DRISDOL) 1.25 MG (50000 UT) CAPS capsule Take 50,000 Units by mouth once a week. 11/28/18   [provider]  pantoprazole (PROTONIX) 40 MG tablet Take 1 tablet (40 mg total) by mouth daily. 04/16/19 12/16/19  Spero Geralds, MD    Family History Family History  Problem Relation Age of Onset  . Breast cancer Mother 80  . Breast cancer Cousin 30  . Breast cancer Cousin 46  . Asthma Father   . Asthma Sister   . Asthma Child     Social History Social History   Tobacco Use  . Smoking status: Never Smoker  . Smokeless tobacco: Never Used  Vaping Use  . Vaping Use: Never used  Substance Use Topics  . Alcohol use: No  . Drug use: No     Allergies   Cephalexin, Fexofenadine, Ketorolac, Morphine, Phenazopyridine, Promethazine, Cetirizine, Contrast media [iodinated diagnostic agents], Latex, Morphine and related, Other, Pyridium [phenazopyridine hcl], Tramadol, Vioxx [rofecoxib], Hepatitis b virus vaccines, Naproxen, Oxycodone-acetaminophen, and Sulfa antibiotics   Review of  Systems Review of Systems  Constitutional: Positive for activity change, chills and fatigue. Negative for appetite change, diaphoresis and fever.  HENT: Negative for congestion, ear pain, rhinorrhea, sinus pressure, sinus pain and sore throat.   Eyes: Negative for pain.  Respiratory: Positive for chest tightness. Negative for cough, choking, shortness of breath, wheezing and stridor.   Cardiovascular: Negative for chest pain, palpitations and leg swelling.  Gastrointestinal: Negative for abdominal pain, diarrhea, nausea and vomiting.  Genitourinary: Negative for dysuria.  Musculoskeletal: Positive for arthralgias and myalgias. Negative for back pain, gait problem, joint swelling, neck pain and neck stiffness.  Skin: Negative for color change, pallor, rash and wound.  Neurological: Positive for headaches. Negative for dizziness, seizures, syncope, weakness, light-headedness and numbness.     Physical Exam Triage Vital Signs ED Triage Vitals  Enc Vitals Group     BP 01/20/20 1848 130/85     Pulse Rate 01/20/20 1848 84     Resp 01/20/20 1848 18     Temp 01/20/20 1848 98.1 F (36.7 C)  Temp Source 01/20/20 1848 Oral     SpO2 01/20/20 1846 99 %     Weight 01/20/20 1846 197 lb (89.4 kg)     Height 01/20/20 1846 5' 1.5" (1.562 m)     Head Circumference --      Peak Flow --      Pain Score 01/20/20 1844 8     Pain Loc --      Pain Edu? --      Excl. in GC? --    No data found.  Updated Vital Signs BP 130/85 (BP Location: Right Arm)   Pulse 84   Temp 98.1 F (36.7 C) (Oral)   Resp 18   Ht 5' 1.5" (1.562 m)   Wt 89.4 kg   SpO2 99%   BMI 36.62 kg/m   Visual Acuity Right Eye Distance:   Left Eye Distance:   Bilateral Distance:    Right Eye Near:   Left Eye Near:    Bilateral Near:     Physical Exam Vitals and nursing note reviewed.  Constitutional:      Appearance: She is well-developed. She is not ill-appearing or diaphoretic.  HENT:     Head: Normocephalic and  atraumatic.  Eyes:     Extraocular Movements: Extraocular movements intact.     Pupils: Pupils are equal, round, and reactive to light.  Cardiovascular:     Rate and Rhythm: Normal rate and regular rhythm.     Pulses:          Carotid pulses are 2+ on the right side and 2+ on the left side.      Radial pulses are 2+ on the right side and 2+ on the left side.       Dorsalis pedis pulses are 2+ on the right side and 2+ on the left side.       Posterior tibial pulses are 2+ on the right side and 2+ on the left side.     Heart sounds: Normal heart sounds. Heart sounds not distant. No murmur heard.  No systolic murmur is present.  No diastolic murmur is present. No friction rub. No gallop. No S3 or S4 sounds.   Pulmonary:     Effort: Pulmonary effort is normal. No respiratory distress.     Breath sounds: Normal breath sounds. No decreased breath sounds, wheezing, rhonchi or rales.  Musculoskeletal:     Cervical back: Normal range of motion and neck supple.  Skin:    General: Skin is warm and dry.     Capillary Refill: Capillary refill takes less than 2 seconds.  Neurological:     General: No focal deficit present.     Mental Status: She is alert.      UC Treatments / Results  Labs (all labs ordered are listed, but only abnormal results are displayed) Labs Reviewed  SARS CORONAVIRUS 2 (TAT 6-24 HRS)    EKG EKG performed in the office today showed a ventricular rate of 75 bpm.  PR interval, QRS duration and QT and QTc were all within normal limits.  There was no acute ST or T wave changes.  Normal EKG.  Radiology No results found.  Procedures Procedures (including critical care time)  Medications Ordered in UC Medications - No data to display  Initial Impression / Assessment and Plan / UC Course  I have reviewed the triage vital signs and the nursing notes.  Pertinent labs & imaging results that were available during my care of the patient  were reviewed by me and  considered in my medical decision making (see chart for details).  Clinical impression: Chest tightness, headache, myalgias and arthralgias since last evening.  Patient has been fully vaccinated against influenza and Covid.  She has also had Covid back in December 2020.  Her physical exam and vitals are very reassuring.  Treatment plan: 1.  The findings and treatment plan were discussed in detail with the patient.  The patient was in agreement. 2.  EKG was within normal limits without any acute ST or T wave changes. 3.  Lung exam was normal so no need to get a chest x-ray. 4.  Will go ahead and test her for Covid and influenza.  We do not have any rapid test at the present time and we will send it to the hospital.  It will take 24 to 48 hours before her results are available.  She should quarantine until she gets her results.  We will give her instructions on quarantine. 5.  Gave her a work note keeping her out of work until she has her results back. 6.  Plenty rest, plenty of fluids, Tylenol or Motrin for fever or discomfort.  She can also use over-the-counter meds as needed. 7.  If she develops chest pain shortness of breath or anything that requires immediate medical attention she should call 911 or seek out a higher level of care at an emergency room setting.  She voiced verbal understanding.  She will follow up with [place] as needed.     Final Clinical Impressions(s) / UC Diagnoses   Final diagnoses:  Chest tightness  Nonintractable headache, unspecified chronicity pattern, unspecified headache type  Myalgia  Pain in other joint     Discharge Instructions     EKG was within normal limits without any acute ST or T wave changes. Lung exam was normal so no need to get a chest x-ray. Will go ahead and test her for Covid and influenza.  We do not have any rapid test at the present time and we will send it to the hospital.  It will take 24 to 48 hours before her results are available.  She  should quarantine until she gets her results.  We will give her instructions on quarantine. Gave her a work note keeping her out of work until she has her results back. Plenty rest, plenty of fluids, Tylenol or Motrin for fever or discomfort.  She can also use over-the-counter meds as needed. If she develops chest pain shortness of breath or anything that requires immediate medical attention she should call 911 or seek out a higher level of care at an emergency room setting.  She voiced verbal understanding.  She will follow up with [place] as needed.    ED Prescriptions    None     PDMP not reviewed this encounter.   Verda Cumins, MD 01/20/20 2044

## 2020-01-21 LAB — SARS CORONAVIRUS 2 (TAT 6-24 HRS): SARS Coronavirus 2: POSITIVE — AB

## 2020-01-23 ENCOUNTER — Other Ambulatory Visit: Payer: BC Managed Care – PPO

## 2020-01-23 DIAGNOSIS — Z20822 Contact with and (suspected) exposure to covid-19: Secondary | ICD-10-CM

## 2020-01-24 ENCOUNTER — Ambulatory Visit: Payer: BC Managed Care – PPO

## 2020-01-26 ENCOUNTER — Other Ambulatory Visit: Payer: BC Managed Care – PPO

## 2020-01-26 LAB — NOVEL CORONAVIRUS, NAA: SARS-CoV-2, NAA: DETECTED — AB

## 2020-01-27 ENCOUNTER — Inpatient Hospital Stay: Payer: BC Managed Care – PPO

## 2020-01-27 ENCOUNTER — Encounter: Payer: Self-pay | Admitting: Internal Medicine

## 2020-01-27 ENCOUNTER — Inpatient Hospital Stay: Payer: BC Managed Care – PPO | Attending: Internal Medicine | Admitting: Internal Medicine

## 2020-01-27 ENCOUNTER — Other Ambulatory Visit: Payer: Self-pay

## 2020-01-27 DIAGNOSIS — D6861 Antiphospholipid syndrome: Secondary | ICD-10-CM | POA: Diagnosis present

## 2020-01-27 DIAGNOSIS — E538 Deficiency of other specified B group vitamins: Secondary | ICD-10-CM | POA: Insufficient documentation

## 2020-01-27 DIAGNOSIS — Z86711 Personal history of pulmonary embolism: Secondary | ICD-10-CM | POA: Insufficient documentation

## 2020-01-27 DIAGNOSIS — R760 Raised antibody titer: Secondary | ICD-10-CM | POA: Insufficient documentation

## 2020-01-27 DIAGNOSIS — E611 Iron deficiency: Secondary | ICD-10-CM | POA: Insufficient documentation

## 2020-01-27 NOTE — Assessment & Plan Note (Addendum)
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  Currently not on any anticoagulation.  Incidental elevated anticardiolipin IgM antibody- 40s [August 2021; December 2021].  However lupus anticoagulant testing was negative.  PT PTT were normal.  Patient has not had any arterial thrombotic events.  Clinically I suspect slightly abnormal anticardiolipin antibody is clinically insignificant at this time.  Question related to underlying autoimmune disorder/rheumatoid arthritis.  Question COVID infection although-Jan 2021.   However at this time patient has no clinical indication of starting anticoagulation.  Recommend continued surveillance without any institution of any anticoagulation therapy.  I would recommend evaluation of hypercoagulable state in about 6 months; also antiphospholipid antibody panel.   #History of gastric bypass-B12 deficiency on B12 injection; recommend checking iron studies CBC.  # Thank you Dr.Patel for allowing me to participate in the care of your pleasant patient. Please do not hesitate to contact me with questions or concerns in the interim.  # DISPOSITION: # follow up in 6 months-MD; 2 weeks prior- labs [ordered]AM appts--Dr.B

## 2020-01-27 NOTE — Progress Notes (Signed)
Brain MRI on 1/21.  Nerve test on 1/31.

## 2020-01-27 NOTE — Progress Notes (Signed)
[facility] CONSULT NOTE  Patient Care Team: Elgie Collard, MD as PCP - General (Obstetrics and Gynecology)  CHIEF COMPLAINTS/PURPOSE OF CONSULTATION: DVT/PE  # 2002- PE [2 weeks post partum] x18 months- coumadin; 2009- SVT- right chest wall- [[facility]]-short-term anticoagulation; topical.   # ANTICRADIOLIPIN ANTIBODY[rheumatology evaluation; Dr.Patel];-08/2019--Pos: Anticardiolipin Ab IgM; Neg: Anticardiolipin Ab IgG, Beta-2Glyco 1 IgA, IgM, IgG, No lupus anticoagulant;   # Rheumatoid arthritis, stage 1; Diagnosed 08/2019; Positive RF; synovitis of the joint; elevated sed rate'l  Continue HCQ 200 mg BID  # Hx of gastric Bypass; hx of COVID infection admitted [Jan 2021]    Oncology History   No history exists.     HISTORY OF PRESENTING ILLNESS:  Debbie Sosa 45 y.o.  female with prior history of PE-postpartum; is currently referred to [place] for abnormal anticardiolipin antibody.  Patient states that she was diagnosed with pulm embolism in 2002 2 weeks post C-section with a second child.  Patient was on anticoagulation with Coumadin for about 18 months.    2009- SVT on right chest wall [[facility]]-coumadin/lovenox [few weeks].  No history of DVT or any subsequent PEs.  No miscarriages.  No arterial thrombotic events.  Patient was admitted to the hospital in January 2021-for severe COVID-19 hospitalization remdesivir treatment.  Did not have any blood clots at the time.  2017- Lovenox prophylactic peri-operatively on gastric bypass  With regards risk factors: Previous history of DVT/PE: see above Family history: none Birth control pills:mirena IUD   Review of Systems  Constitutional: Negative for chills, diaphoresis, fever, malaise/fatigue and weight loss.  HENT: Negative for nosebleeds and sore throat.   Eyes: Negative for double vision.  Respiratory: Negative for cough, hemoptysis, sputum production, shortness of breath and wheezing.   Cardiovascular: Negative  for chest pain, palpitations, orthopnea and leg swelling.  Gastrointestinal: Negative for abdominal pain, blood in stool, constipation, diarrhea, heartburn, melena, nausea and vomiting.  Genitourinary: Negative for dysuria, frequency and urgency.  Musculoskeletal: Positive for back pain and joint pain.  Skin: Negative.  Negative for itching and rash.  Neurological: Negative for dizziness, tingling, focal weakness, weakness and headaches.  Endo/Heme/Allergies: Does not bruise/bleed easily.  Psychiatric/Behavioral: Negative for depression. The patient is not nervous/anxious and does not have insomnia.      MEDICAL HISTORY:  Past Medical History:  Diagnosis Date  . Asthma   . Pulmonary embolism ([place])     SURGICAL HISTORY: Past Surgical History:  Procedure Laterality Date  . BREAST BIOPSY Left 2006   benign/clip  . BREAST BIOPSY Right 2015   benign/clip  . BREAST REDUCTION SURGERY    . CESAREAN SECTION    . CESAREAN SECTION    . CHOLECYSTECTOMY    . COLONOSCOPY    . COLONOSCOPY WITH PROPOFOL N/A 10/31/2018   Procedure: COLONOSCOPY WITH PROPOFOL;  Surgeon: [name], MD;  Location: [facility] ENDOSCOPY;  Service: Gastroenterology;  Laterality: N/A;  . ESOPHAGOGASTRODUODENOSCOPY (EGD) WITH PROPOFOL N/A 10/04/2016   Procedure: ESOPHAGOGASTRODUODENOSCOPY (EGD) WITH PROPOFOL;  Surgeon: [name], MD;  Location: [facility] ENDOSCOPY;  Service: Gastroenterology;  Laterality: N/A;  . ESOPHAGOGASTRODUODENOSCOPY (EGD) WITH PROPOFOL N/A 04/14/2017   Procedure: ESOPHAGOGASTRODUODENOSCOPY (EGD) WITH PROPOFOL;  Surgeon: [name], MD;  Location: [facility] ENDOSCOPY;  Service: Gastroenterology;  Laterality: N/A;  . ESOPHAGOGASTRODUODENOSCOPY (EGD) WITH PROPOFOL N/A 10/31/2018   Procedure: ESOPHAGOGASTRODUODENOSCOPY (EGD) WITH PROPOFOL;  Surgeon: [name], MD;  Location: [facility] ENDOSCOPY;  Service: Gastroenterology;  Laterality: N/A;  . ESOPHAGOGASTRODUODENOSCOPY (EGD) WITH PROPOFOL N/A 06/28/2019   Procedure:  ESOPHAGOGASTRODUODENOSCOPY (  EGD) WITH PROPOFOL;  Surgeon: [name], MD;  Location: [facility] ENDOSCOPY;  Service: Gastroenterology;  Laterality: N/A;  . GASTRIC BYPASS     01/2015  . KNEE SURGERY    . NASAL SINUS SURGERY    . PLANTAR FASCIA SURGERY     x 3  . REDUCTION MAMMAPLASTY Bilateral 2005    SOCIAL HISTORY: Social History   Socioeconomic History  . Marital status: Single    Spouse name: Not on file  . Number of children: Not on file  . Years of education: Not on file  . Highest education level: Not on file  Occupational History  . Not on file  Tobacco Use  . Smoking status: Never Smoker  . Smokeless tobacco: Never Used  Vaping Use  . Vaping Use: Never used  Substance and Sexual Activity  . Alcohol use: No  . Drug use: No  . Sexual activity: Not on file  Other Topics Concern  . 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    Social Determinants of Health   Financial Resource Strain: Not on file  Food Insecurity: Not on file  Transportation Needs: Not on file  Physical Activity: Not on file  Stress: Not on file  Social Connections: Not on file  Intimate Partner Violence: Not on file    FAMILY HISTORY: Family History  Problem Relation Age of Onset  . Breast cancer Mother 25  . Breast cancer Cousin 30  . Breast cancer Cousin 77  . Asthma Father   . Asthma Sister   . Asthma Child     ALLERGIES:  is allergic to cephalexin, fexofenadine, ketorolac, morphine, phenazopyridine, promethazine, cetirizine, contrast media [iodinated diagnostic agents], latex, morphine and related, other, pyridium [phenazopyridine hcl], tramadol, vioxx [rofecoxib], hepatitis b virus vaccines, naproxen, oxycodone-acetaminophen, and sulfa antibiotics.  MEDICATIONS:  Current Outpatient Medications  Medication Sig Dispense Refill  . albuterol (VENTOLIN HFA) 108 (90 Base)  MCG/ACT inhaler Inhale 1-2 puffs INH Q4-6hr prn for chest tightness, cough, wheezing, SOB/DOE. 18 g 0  . budesonide-formoterol (SYMBICORT) 160-4.5 MCG/ACT inhaler Inhale 2 puffs into the lungs in the morning and at bedtime. 1 each 12  . cyanocobalamin (,VITAMIN B-12,) 1000 MCG/ML injection Inject into the muscle.    . diphenhydrAMINE (BENADRYL) 25 mg capsule Take by mouth.    . EPINEPHrine (EPIPEN 2-PAK) 0.3 mg/0.3 mL IJ SOAJ injection Inject 0.3 mLs (0.3 mg total) into the muscle as needed for anaphylaxis. 1 each 1  . gabapentin (NEURONTIN) 300 MG capsule Take 300 mg by mouth daily.    . hydroxychloroquine (PLAQUENIL) 200 MG tablet Take 200 mg by mouth 2 (two) times daily.    . montelukast (SINGULAIR) 10 MG tablet Take 10 mg by mouth daily.     . Vitamin D, Ergocalciferol, (DRISDOL) 1.25 MG (50000 UT) CAPS capsule Take 50,000 Units by mouth once a week.     No current facility-administered medications for this visit.      Marland Kitchen  PHYSICAL EXAMINATION:  Vitals:   01/27/20 1118  BP: 123/75  Pulse: 98  Resp: 16  Temp: 97.9 F (36.6 C)  SpO2: 99%   Filed Weights   01/27/20 1118  Weight: 197 lb 6.4 oz (89.5 kg)    Physical Exam HENT:     Head: Normocephalic and atraumatic.     Mouth/Throat:     Mouth: Oropharynx is clear and moist.  Pharynx: No oropharyngeal exudate.  Eyes:     Pupils: Pupils are equal, round, and reactive to light.  Cardiovascular:     Rate and Rhythm: Normal rate and regular rhythm.  Pulmonary:     Effort: No respiratory distress.     Breath sounds: No wheezing.  Abdominal:     General: Bowel sounds are normal. There is no distension.     Palpations: Abdomen is soft. There is no mass.     Tenderness: There is no abdominal tenderness. There is no guarding or rebound.  Musculoskeletal:        General: No tenderness or edema. Normal range of motion.     Cervical back: Normal range of motion and neck supple.  Skin:    General: Skin is warm.   Neurological:     Mental Status: She is alert and oriented to person, place, and time.  Psychiatric:        Mood and Affect: Affect normal.      LABORATORY DATA:  I have reviewed the data as listed Lab Results  Component Value Date   WBC 6.2 01/18/2019   HGB 11.2 (L) 01/18/2019   HCT 33.1 (L) 01/18/2019   MCV 83.8 01/18/2019   PLT 330 01/18/2019   No results for input(s): NA, K, CL, CO2, GLUCOSE, BUN, CREATININE, CALCIUM, GFRNONAA, GFRAA, PROT, ALBUMIN, AST, ALT, ALKPHOS, BILITOT, BILIDIR, IBILI in the last 8760 hours.  RADIOGRAPHIC STUDIES: I have personally reviewed the radiological images as listed and agreed with the findings in the report. MM 3D SCREEN BREAST BILATERAL  Result Date: 01/06/2020 CLINICAL DATA:  Screening. EXAM: DIGITAL SCREENING BILATERAL MAMMOGRAM WITH TOMO AND CAD COMPARISON:  Previous exam(s). ACR Breast Density Category b: There are scattered areas of fibroglandular density. FINDINGS: There are no findings suspicious for malignancy. Images were processed with CAD. IMPRESSION: No mammographic evidence of malignancy. A result letter of this screening mammogram will be mailed directly to the patient. RECOMMENDATION: Screening mammogram in one year. (Code:SM-B-01Y) BI-RADS CATEGORY  1: Negative. Electronically Signed   By: Lajean Manes M.D.   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  Currently not on any anticoagulation.  Incidental elevated anticardiolipin IgM antibody- 40s [August 2021; December 2021].  However lupus anticoagulant testing was negative.  PT PTT were normal.  Patient has not had any arterial thrombotic events.  Clinically I suspect slightly abnormal anticardiolipin antibody is clinically insignificant at this time.  Question related to underlying autoimmune disorder/rheumatoid arthritis.  Question COVID infection although-Jan 2021.   However at this time  patient has no clinical indication of starting anticoagulation.  Recommend continued surveillance without any institution of any anticoagulation therapy.  I would recommend evaluation of hypercoagulable state in about 6 months; also antiphospholipid antibody panel.   #History of gastric bypass-B12 deficiency on B12 injection; recommend checking iron studies CBC.  # Thank you Dr.Patel for allowing me to participate in the care of your pleasant patient. Please do not hesitate to contact me with questions or concerns in the interim.  # DISPOSITION: # follow up in 6 months-MD; 2 weeks prior- labs [ordered]AM appts--Dr.B  All questions were answered. The patient knows to call the clinic with any problems, questions or concerns.   Cammie Sickle, MD 01/27/2020 1:17 PM

## 2020-02-07 ENCOUNTER — Ambulatory Visit
Admission: RE | Admit: 2020-02-07 | Discharge: 2020-02-07 | Disposition: A | Discharge status: Home / Self Care | Payer: BC Managed Care – PPO | Source: Ambulatory Visit | Attending: Neurology | Admitting: Neurology

## 2020-02-07 ENCOUNTER — Other Ambulatory Visit: Payer: Self-pay

## 2020-02-07 DIAGNOSIS — R202 Paresthesia of skin: Secondary | ICD-10-CM

## 2020-02-07 DIAGNOSIS — M542 Cervicalgia: Secondary | ICD-10-CM

## 2020-02-07 DIAGNOSIS — M79601 Pain in right arm: Secondary | ICD-10-CM | POA: Diagnosis present

## 2020-02-07 DIAGNOSIS — M25511 Pain in right shoulder: Secondary | ICD-10-CM | POA: Diagnosis present

## 2020-02-07 MED ORDER — GADOBUTROL 1 MMOL/ML IV SOLN
9.0000 mL | Freq: Once | INTRAVENOUS | Status: AC | PRN
  Administered 2020-02-07: 9 mL via INTRAVENOUS

## 2020-07-27 ENCOUNTER — Inpatient Hospital Stay: Payer: BC Managed Care – PPO | Attending: Internal Medicine

## 2020-07-27 DIAGNOSIS — R768 Other specified abnormal immunological findings in serum: Secondary | ICD-10-CM | POA: Diagnosis not present

## 2020-07-27 DIAGNOSIS — E611 Iron deficiency: Secondary | ICD-10-CM

## 2020-07-27 DIAGNOSIS — R760 Raised antibody titer: Secondary | ICD-10-CM

## 2020-07-27 DIAGNOSIS — M069 Rheumatoid arthritis, unspecified: Secondary | ICD-10-CM | POA: Diagnosis not present

## 2020-07-27 DIAGNOSIS — E538 Deficiency of other specified B group vitamins: Secondary | ICD-10-CM | POA: Diagnosis not present

## 2020-07-27 DIAGNOSIS — Z86711 Personal history of pulmonary embolism: Secondary | ICD-10-CM | POA: Insufficient documentation

## 2020-07-27 LAB — CBC WITH DIFFERENTIAL/PLATELET
Abs Immature Granulocytes: 0.01 10*3/uL (ref 0.00–0.07)
Basophils Absolute: 0 10*3/uL (ref 0.0–0.1)
Basophils Relative: 1 %
Eosinophils Absolute: 0.1 10*3/uL (ref 0.0–0.5)
Eosinophils Relative: 3 %
HCT: 35.1 % — ABNORMAL LOW (ref 36.0–46.0)
Hemoglobin: 11.3 g/dL — ABNORMAL LOW (ref 12.0–15.0)
Immature Granulocytes: 0 %
Lymphocytes Relative: 30 %
Lymphs Abs: 1.4 10*3/uL (ref 0.7–4.0)
MCH: 27.1 pg (ref 26.0–34.0)
MCHC: 32.2 g/dL (ref 30.0–36.0)
MCV: 84.2 fL (ref 80.0–100.0)
Monocytes Absolute: 0.4 10*3/uL (ref 0.1–1.0)
Monocytes Relative: 9 %
Neutro Abs: 2.5 10*3/uL (ref 1.7–7.7)
Neutrophils Relative %: 57 %
Platelets: 342 10*3/uL (ref 150–400)
RBC: 4.17 MIL/uL (ref 3.87–5.11)
RDW: 15.4 % (ref 11.5–15.5)
WBC: 4.5 10*3/uL (ref 4.0–10.5)
nRBC: 0 % (ref 0.0–0.2)

## 2020-07-27 LAB — COMPREHENSIVE METABOLIC PANEL
ALT: 19 U/L (ref 0–44)
AST: 21 U/L (ref 15–41)
Albumin: 3.6 g/dL (ref 3.5–5.0)
Alkaline Phosphatase: 75 U/L (ref 38–126)
Anion gap: 5 (ref 5–15)
BUN: 13 mg/dL (ref 6–20)
CO2: 22 mmol/L (ref 22–32)
Calcium: 8.7 mg/dL — ABNORMAL LOW (ref 8.9–10.3)
Chloride: 108 mmol/L (ref 98–111)
Creatinine, Ser: 0.73 mg/dL (ref 0.44–1.00)
GFR, Estimated: >60 mL/min (ref >60)
Glucose, Bld: 89 mg/dL (ref 70–99)
Potassium: 3.9 mmol/L (ref 3.5–5.1)
Sodium: 135 mmol/L (ref 135–145)
Total Bilirubin: 0.4 mg/dL (ref 0.3–1.2)
Total Protein: 7.6 g/dL (ref 6.5–8.1)

## 2020-07-27 LAB — IRON AND TIBC
Iron: 74 ug/dL (ref 28–170)
Saturation Ratios: 18 % (ref 10.4–31.8)
TIBC: 420 ug/dL (ref 250–450)
UIBC: 346 ug/dL

## 2020-07-27 LAB — LACTATE DEHYDROGENASE: LDH: 159 U/L (ref 98–192)

## 2020-07-27 LAB — FERRITIN: Ferritin: 6 ng/mL — ABNORMAL LOW (ref 11–307)

## 2020-07-28 LAB — BETA-2-GLYCOPROTEIN I ABS, IGG/M/A
Beta-2 Glyco I IgG: <9 GPI IgG units (ref 0–20)
Beta-2-Glycoprotein I IgA: <9 GPI IgA units (ref 0–25)
Beta-2-Glycoprotein I IgM: <9 GPI IgM units (ref 0–32)

## 2020-07-28 LAB — PROTEIN S, TOTAL: Protein S Ag, Total: 110 % (ref 60–150)

## 2020-07-29 LAB — PROTEIN C, TOTAL: Protein C, Total: 112 % (ref 60–150)

## 2020-07-29 LAB — ANTIPHOSPHOLIPID SYNDROME PROF
Anticardiolipin IgG: <9 GPL U/mL (ref 0–14)
Anticardiolipin IgM: 69 MPL U/mL — ABNORMAL HIGH (ref 0–12)
DRVVT: 21.8 s (ref 0.0–47.0)
PTT Lupus Anticoagulant: 26.7 s (ref 0.0–51.9)

## 2020-07-31 LAB — FACTOR 5 LEIDEN

## 2020-07-31 LAB — PROTHROMBIN GENE MUTATION

## 2020-08-10 ENCOUNTER — Encounter: Payer: Self-pay | Admitting: Internal Medicine

## 2020-08-10 ENCOUNTER — Inpatient Hospital Stay (HOSPITAL_BASED_OUTPATIENT_CLINIC_OR_DEPARTMENT_OTHER): Payer: BC Managed Care – PPO | Admitting: Internal Medicine

## 2020-08-10 ENCOUNTER — Other Ambulatory Visit: Payer: Self-pay

## 2020-08-10 DIAGNOSIS — Z86711 Personal history of pulmonary embolism: Secondary | ICD-10-CM | POA: Diagnosis not present

## 2020-08-10 DIAGNOSIS — R760 Raised antibody titer: Secondary | ICD-10-CM | POA: Diagnosis not present

## 2020-08-10 NOTE — Assessment & Plan Note (Addendum)
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  Currently not on any anticoagulation.  Incidental elevated anticardiolipin IgM antibody- 40s [August 2021; December 2021].  July 2022-repeat work-up including lupus anticoagulant/beta-2 glycoprotein; cardiolipin IgA/G- NEGATIVE; again slightly abnormal elevated cardiolipin IgM- 60.   #Clinically I do not think patient has antiphospholipid antibody syndrome; I suspect the aberrant cardiolipin IgM is related to her underlying rheumatologic disease.  This should not merit any anticoagulation.   #History of gastric bypass/-B12 deficiency on B12 injection-July 2022-ferritin 6/iron deficiency; discussed regarding IV iron infusions. Discussed the potential acute infusion reactions with IV iron; which are quite rare.  Patient understands the risk; will proceed with infusions.  # RA- poorly controlled- refer to Dr.Patel [s/p arthro- sec to RA]- ? C  # DISPOSITION: # Venofer weekly x3; B12 injection x1- later this week  # Follow up MD 3 months- labs- cbc/possible venofer; B12 injection-dr.B # Dr.B

## 2020-08-10 NOTE — Progress Notes (Signed)
[facility] CONSULT NOTE  Patient Care Team: Elgie Collard, MD as PCP - General (Obstetrics and Gynecology)  CHIEF COMPLAINTS/PURPOSE OF CONSULTATION: DVT/PE  # 2002- PE [2 weeks post partum] x18 months- coumadin; 2009- SVT- right chest wall- [[facility]]-short-term anticoagulation; topical.   # ANTICRADIOLIPIN ANTIBODY[rheumatology evaluation; Dr.Patel];-08/2019--Pos: Anticardiolipin Ab IgM; Neg: Anticardiolipin Ab IgG, Beta-2Glyco 1 IgA, IgM, IgG, No lupus anticoagulant;   # Rheumatoid arthritis, stage 1; Diagnosed 08/2019; Positive RF; synovitis of the joint; elevated sed rate'l  Continue HCQ 200 mg BID  # Hx of gastric Bypass; hx of COVID infection admitted [Jan 2021]    Oncology History   No history exists.     HISTORY OF PRESENTING ILLNESS:  Debbie Sosa 45 y.o.  female with prior history of PE-postpartum/history of rheumatoid arthritis-abnormal and the cardiolipin antibodies is here for follow-up.  Patient denies any new onset of shortness of breath or cough.  Denies any swelling in the legs.  Patient has chronic mild joint pains attributable to her rheumatoid arthritis.  Patient has fatigue.  Denies any blood in stools or black stools.   Review of Systems  Constitutional:  Positive for malaise/fatigue. Negative for chills, diaphoresis, fever and weight loss.  HENT:  Negative for nosebleeds and sore throat.   Eyes:  Negative for double vision.  Respiratory:  Negative for cough, hemoptysis, sputum production, shortness of breath and wheezing.   Cardiovascular:  Negative for chest pain, palpitations, orthopnea and leg swelling.  Gastrointestinal:  Negative for abdominal pain, blood in stool, constipation, diarrhea, heartburn, melena, nausea and vomiting.  Genitourinary:  Negative for dysuria, frequency and urgency.  Musculoskeletal:  Positive for back pain and joint pain.  Skin: Negative.  Negative for itching and rash.  Neurological:  Negative for  dizziness, tingling, focal weakness, weakness and headaches.  Endo/Heme/Allergies:  Does not bruise/bleed easily.  Psychiatric/Behavioral:  Negative for depression. The patient is not nervous/anxious and does not have insomnia.     MEDICAL HISTORY:  Past Medical History:  Diagnosis Date   Asthma    Pulmonary embolism ([place])     SURGICAL HISTORY: Past Surgical History:  Procedure Laterality Date   BREAST BIOPSY Left 2006   benign/clip   BREAST BIOPSY Right 2015   benign/clip   BREAST REDUCTION SURGERY     CESAREAN SECTION     CESAREAN SECTION     CHOLECYSTECTOMY     COLONOSCOPY     COLONOSCOPY WITH PROPOFOL N/A 10/31/2018   Procedure: COLONOSCOPY WITH PROPOFOL;  Surgeon: [name], MD;  Location: [facility] ENDOSCOPY;  Service: Gastroenterology;  Laterality: N/A;   ESOPHAGOGASTRODUODENOSCOPY (EGD) WITH PROPOFOL N/A 10/04/2016   Procedure: ESOPHAGOGASTRODUODENOSCOPY (EGD) WITH PROPOFOL;  Surgeon: [name], MD;  Location: [facility] ENDOSCOPY;  Service: Gastroenterology;  Laterality: N/A;   ESOPHAGOGASTRODUODENOSCOPY (EGD) WITH PROPOFOL N/A 04/14/2017   Procedure: ESOPHAGOGASTRODUODENOSCOPY (EGD) WITH PROPOFOL;  Surgeon: [name], MD;  Location: [facility] ENDOSCOPY;  Service: Gastroenterology;  Laterality: N/A;   ESOPHAGOGASTRODUODENOSCOPY (EGD) WITH PROPOFOL N/A 10/31/2018   Procedure: ESOPHAGOGASTRODUODENOSCOPY (EGD) WITH PROPOFOL;  Surgeon: [name], MD;  Location: [facility] ENDOSCOPY;  Service: Gastroenterology;  Laterality: N/A;   ESOPHAGOGASTRODUODENOSCOPY (EGD) WITH PROPOFOL N/A 06/28/2019   Procedure: ESOPHAGOGASTRODUODENOSCOPY (EGD) WITH PROPOFOL;  Surgeon: [name], MD;  Location: [facility] ENDOSCOPY;  Service: Gastroenterology;  Laterality: N/A;   GASTRIC BYPASS     01/2015   KNEE SURGERY     NASAL SINUS SURGERY     PLANTAR FASCIA SURGERY     x 3   REDUCTION  MAMMAPLASTY Bilateral 2005    SOCIAL HISTORY: Social History   Socioeconomic History   Marital status: Single    Spouse  name: Not on file   Number of children: Not on file   Years of education: Not on file   Highest education level: Not on file  Occupational History   Not on file  Tobacco Use   Smoking status: Never   Smokeless tobacco: Never  Vaping Use   Vaping Use: Never used  Substance and Sexual Activity   Alcohol use: No   Drug use: No   Sexual activity: Not on file  Other Topics Concern   Not on file  Social History Narrative   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    Social Determinants of Radio broadcast assistant Strain: Not on file  Food Insecurity: Not on file  Transportation Needs: Not on file  Physical Activity: Not on file  Stress: Not on file  Social Connections: Not on file  Intimate Partner Violence: Not on file    FAMILY HISTORY: Family History  Problem Relation Age of Onset   Breast cancer Mother 97   Breast cancer Cousin 62   Breast cancer Cousin 12   Asthma Father    Asthma Sister    Asthma Child     ALLERGIES:  is allergic to cephalexin, fexofenadine, ketorolac, morphine, phenazopyridine, promethazine, cetirizine, contrast media [iodinated diagnostic agents], latex, morphine and related, other, pyridium [phenazopyridine hcl], tramadol, vioxx [rofecoxib], hepatitis b virus vaccines, naproxen, oxycodone-acetaminophen, and sulfa antibiotics.  MEDICATIONS:  Current Outpatient Medications  Medication Sig Dispense Refill   albuterol (VENTOLIN HFA) 108 (90 Base) MCG/ACT inhaler Inhale 1-2 puffs INH Q4-6hr prn for chest tightness, cough, wheezing, SOB/DOE. 18 g 0   budesonide-formoterol (SYMBICORT) 160-4.5 MCG/ACT inhaler Inhale 2 puffs into the lungs in the morning and at bedtime. 1 each 12   cyclobenzaprine (FLEXERIL) 5 MG tablet Take 5-10 mg by mouth every 8 (eight) hours as needed.     EPINEPHrine (EPIPEN 2-PAK) 0.3 mg/0.3 mL IJ SOAJ injection Inject 0.3 mLs (0.3 mg total) into  the muscle as needed for anaphylaxis. 1 each 1   montelukast (SINGULAIR) 10 MG tablet Take 10 mg by mouth daily.      phentermine (ADIPEX-P) 37.5 MG tablet Take 37.5 mg by mouth every morning.     Vitamin D, Ergocalciferol, (DRISDOL) 1.25 MG (50000 UT) CAPS capsule Take 50,000 Units by mouth once a week.     No current facility-administered medications for this visit.      Marland Kitchen  PHYSICAL EXAMINATION:  Vitals:   08/10/20 1536  BP: 115/85  Pulse: 94  Resp: 16  Temp: 99.2 F (37.3 C)  SpO2: 100%   Filed Weights   08/10/20 1536  Weight: 189 lb (85.7 kg)    Physical Exam HENT:     Head: Normocephalic and atraumatic.     Mouth/Throat:     Pharynx: No oropharyngeal exudate.  Eyes:     Pupils: Pupils are equal, round, and reactive to light.  Cardiovascular:     Rate and Rhythm: Normal rate and regular rhythm.  Pulmonary:     Effort: No respiratory distress.     Breath sounds: No wheezing.  Abdominal:     General: Bowel sounds are normal. There is no distension.     Palpations: Abdomen is soft. There is no mass.     Tenderness: There is no abdominal  tenderness. There is no guarding or rebound.  Musculoskeletal:        General: No tenderness. Normal range of motion.     Cervical back: Normal range of motion and neck supple.  Skin:    General: Skin is warm.  Neurological:     Mental Status: She is alert and oriented to person, place, and time.  Psychiatric:        Mood and Affect: Affect normal.     LABORATORY DATA:  I have reviewed the data as listed Lab Results  Component Value Date   WBC 4.5 07/27/2020   HGB 11.3 (L) 07/27/2020   HCT 35.1 (L) 07/27/2020   MCV 84.2 07/27/2020   PLT 342 07/27/2020   Recent Labs    07/27/20 0805  NA 135  K 3.9  CL 108  CO2 22  GLUCOSE 89  BUN 13  CREATININE 0.73  CALCIUM 8.7*  GFRNONAA >60  PROT 7.6  ALBUMIN 3.6  AST 21  ALT 19  ALKPHOS 75  BILITOT 0.4    RADIOGRAPHIC STUDIES: I have personally reviewed the  radiological images as listed and agreed with the findings in the report. No results found.   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  Currently not on any anticoagulation.  Incidental elevated anticardiolipin IgM antibody- 40s [August 2021; December 2021].  July 2022-repeat work-up including lupus anticoagulant/beta-2 glycoprotein; cardiolipin IgA/G- NEGATIVE; again slightly abnormal elevated cardiolipin IgM- 60.   #Clinically I do not think patient has antiphospholipid antibody syndrome; I suspect the aberrant cardiolipin IgM is related to her underlying rheumatologic disease.  This should not merit any anticoagulation.   #History of gastric bypass/-B12 deficiency on B12 injection-July 2022-ferritin 6/iron deficiency; discussed regarding IV iron infusions. Discussed the potential acute infusion reactions with IV iron; which are quite rare.  Patient understands the risk; will proceed with infusions.   # RA- poorly controlled- refer to Dr.Patel [s/p arthro- sec to RA]- ? C  # DISPOSITION: # Venofer weekly x3; B12 injection x1- later this week  # Follow up MD 3 months- labs- cbc/possible venofer; B12 injection-dr.B # Dr.B  All questions were answered. The patient knows to call the clinic with any problems, questions or concerns.   Cammie Sickle, MD 08/20/2020 6:35 AM

## 2020-08-10 NOTE — Progress Notes (Signed)
Has joint pain and inflammation. Concerned with some of the lower numbers in labs. States it is hard for her to take a multi vitamins and other supplements such as iron because it messes with her stomach. Talk about booster shot.

## 2020-08-20 ENCOUNTER — Other Ambulatory Visit: Payer: Self-pay | Admitting: Internal Medicine

## 2020-08-20 ENCOUNTER — Inpatient Hospital Stay: Payer: BC Managed Care – PPO | Attending: Internal Medicine

## 2020-08-20 VITALS — BP 111/72 | HR 69 | Temp 98.4°F | Resp 18

## 2020-08-20 DIAGNOSIS — E538 Deficiency of other specified B group vitamins: Secondary | ICD-10-CM | POA: Insufficient documentation

## 2020-08-20 DIAGNOSIS — E611 Iron deficiency: Secondary | ICD-10-CM | POA: Diagnosis present

## 2020-08-20 MED ORDER — CYANOCOBALAMIN 1000 MCG/ML IJ SOLN
1000.0000 ug | Freq: Once | INTRAMUSCULAR | Status: AC
  Administered 2020-08-20: 1000 ug via INTRAMUSCULAR
  Filled 2020-08-20: qty 1

## 2020-08-20 MED ORDER — SODIUM CHLORIDE 0.9 % IV SOLN: 200.0000 mg | Freq: Once | INTRAVENOUS | Status: DC

## 2020-08-20 MED ORDER — SODIUM CHLORIDE 0.9 % IV SOLN
Freq: Once | INTRAVENOUS | Status: AC
  Filled 2020-08-20: qty 250

## 2020-08-20 MED ORDER — IRON SUCROSE 20 MG/ML IV SOLN
200.0000 mg | Freq: Once | INTRAVENOUS | Status: AC
  Administered 2020-08-20: 200 mg via INTRAVENOUS
  Filled 2020-08-20: qty 10

## 2020-08-20 NOTE — Patient Instructions (Addendum)
[facility]  Discharge Instructions: Thank you for choosing [facility] to provide your oncology and hematology care.  If you have a lab appointment with the [facility], please go directly to the [facility] and check in at the registration area.  Wear comfortable clothing and clothing appropriate for easy access to any Portacath or PICC line.   We strive to give you quality time with your provider. You may need to reschedule your appointment if you arrive late (15 or more minutes).  Arriving late affects you and other patients whose appointments are after yours.  Also, if you miss three or more appointments without notifying the office, you may be dismissed from the clinic at the provider's discretion.      For prescription refill requests, have your pharmacy contact our office and allow 72 hours for refills to be completed. Today you received the following chemotherapy and/or immunotherapy agents VENOFER   & VITAMIN B 12   To help prevent nausea and vomiting after your treatment, we encourage you to take your nausea medication as directed.  BELOW ARE SYMPTOMS THAT SHOULD BE REPORTED IMMEDIATELY: *FEVER GREATER THAN 100.4 F (38 C) OR HIGHER *CHILLS OR SWEATING *NAUSEA AND VOMITING THAT IS NOT CONTROLLED WITH YOUR NAUSEA MEDICATION *UNUSUAL SHORTNESS OF BREATH *UNUSUAL BRUISING OR BLEEDING *URINARY PROBLEMS (pain or burning when urinating, or frequent urination) *BOWEL PROBLEMS (unusual diarrhea, constipation, pain near the anus) TENDERNESS IN MOUTH AND THROAT WITH OR WITHOUT PRESENCE OF ULCERS (sore throat, sores in mouth, or a toothache) UNUSUAL RASH, SWELLING OR PAIN  UNUSUAL VAGINAL DISCHARGE OR ITCHING   Items with * indicate a potential emergency and should be followed up as soon as possible or go to the Emergency Department if any problems should occur.  Please show the CHEMOTHERAPY ALERT CARD or IMMUNOTHERAPY ALERT CARD at  check-in to the Emergency Department and triage nurse.  Should you have questions after your visit or need to cancel or reschedule your appointment, please contact [facility]  [phone] and follow the prompts.  Office hours are 8:00 a.m. to 4:30 p.m. Monday - Friday. Please note that voicemails left after 4:00 p.m. may not be returned until the following business day.  We are closed weekends and major holidays. You have access to a nurse at all times for urgent questions. Please call the main number to the clinic [phone] and follow the prompts.  For any non-urgent questions, you may also contact your provider using MyChart. We now offer e-Visits for anyone 69 and older to request care online for non-urgent symptoms. For details visit [email or web address].   Also download the MyChart app! Go to the app store, search "MyChart", open the app, select [facility], and log in with your MyChart username and password.  Due to Covid, a mask is required upon entering the hospital/clinic. If you do not have a mask, one will be given to you upon arrival. For doctor visits, patients may have 1 support person aged 52 or older with them. For treatment visits, patients cannot have anyone with them due to current Covid guidelines and our immunocompromised population.   Iron Sucrose injection What is this medication? IRON SUCROSE (AHY ern SOO krohs) is an iron complex. Iron is used to make healthy red blood cells, which carry oxygen and nutrients throughout the body. This medicine is used to treat iron deficiency anemia in people with chronickidney disease. This medicine may be used for other  purposes; ask your health care provider orpharmacist if you have questions. COMMON BRAND NAME(S): Venofer What should I tell my care team before I take this medication? They need to know if you have any of these conditions: anemia not caused by low iron levels heart disease high  levels of iron in the blood kidney disease liver disease an unusual or allergic reaction to iron, other medicines, foods, dyes, or preservatives pregnant or trying to get pregnant breast-feeding How should I use this medication? This medicine is for infusion into a vein. It is given by a health careprofessional in a hospital or clinic setting. Talk to your pediatrician regarding the use of this medicine in children. While this drug may be prescribed for children as young as 2 years for selectedconditions, precautions do apply. Overdosage: If you think you have taken too much of this medicine contact apoison control center or emergency room at once. NOTE: This medicine is only for you. Do not share this medicine with others. What if I miss a dose? It is important not to miss your dose. Call your doctor or health careprofessional if you are unable to keep an appointment. What may interact with this medication? Do not take this medicine with any of the following medications: deferoxamine dimercaprol other iron products This medicine may also interact with the following medications: chloramphenicol deferasirox This list may not describe all possible interactions. Give your health care provider a list of all the medicines, herbs, non-prescription drugs, or dietary supplements you use. Also tell them if you smoke, drink alcohol, or use illegaldrugs. Some items may interact with your medicine. What should I watch for while using this medication? Visit your doctor or healthcare professional regularly. Tell your doctor or healthcare professional if your symptoms do not start to get better or if theyget worse. You may need blood work done while you are taking this medicine. You may need to follow a special diet. Talk to your doctor. Foods that contain iron include: whole grains/cereals, dried fruits, beans, or peas, leafy greenvegetables, and organ meats (liver, kidney). What side effects may I notice  from receiving this medication? Side effects that you should report to your doctor or health care professionalas soon as possible: allergic reactions like skin rash, itching or hives, swelling of the face, lips, or tongue breathing problems changes in blood pressure cough fast, irregular heartbeat feeling faint or lightheaded, falls fever or chills flushing, sweating, or hot feelings joint or muscle aches/pains seizures swelling of the ankles or feet unusually weak or tired Side effects that usually do not require medical attention (report to yourdoctor or health care professional if they continue or are bothersome): diarrhea feeling achy headache irritation at site where injected nausea, vomiting stomach upset tiredness This list may not describe all possible side effects. Call your doctor for medical advice about side effects. You may report side effects to FDA at1-800-FDA-1088. Where should I keep my medication? This drug is given in a hospital or clinic and will not be stored at home. NOTE: This sheet is a summary. It may not cover all possible information. If you have questions about this medicine, talk to your doctor, pharmacist, orhealth care provider.  2022 Elsevier/Gold Standard (2010-10-14 17:14:35)  Vitamin B12 Injection What is this medication? Vitamin B12 (VAHY tuh min B12) prevents and treats low vitamin B12 levels in your body. It is used in people who do not get enough vitamin B12 from their diet or when their digestive tract does not absorb  enough. Vitamin B12 plays an important role in maintaining the health of your nervous system and red bloodcells. This medicine may be used for other purposes; ask your health care provider orpharmacist if you have questions. COMMON BRAND NAME(S): B-12 Compliance Kit, B-12 Injection Kit, Cyomin, LA-12,Nutri-Twelve, Physicians EZ Use B-12, Primabalt What should I tell my care team before I take this medication? They need to know  if you have any of these conditions: Kidney disease Leber's disease Megaloblastic anemia An unusual or allergic reaction to cyanocobalamin, cobalt, other medications, foods, dyes, or preservatives Pregnant or trying to get pregnant Breast-feeding How should I use this medication? This medication is injected into a muscle or deeply under the skin. It is usually given in a clinic or care team's office. However, your care team Es[place] Sheets you how to inject yourself. Follow all instructions. Talk to your care team about the use of this medication in children. Specialcare may be needed. Overdosage: If you think you have taken too much of this medicine contact apoison control center or emergency room at once. NOTE: This medicine is only for you. Do not share this medicine with others. What if I miss a dose? If you are given your dose at a clinic or care team's office, call to reschedule your appointment. If you give your own injections, and you miss a dose, take it as soon as you can. If it is almost time for your next dose, takeonly that dose. Do not take double or extra doses. What may interact with this medication? Colchicine Heavy alcohol intake This list may not describe all possible interactions. Give your health care provider a list of all the medicines, herbs, non-prescription drugs, or dietary supplements you use. Also tell them if you smoke, drink alcohol, or use illegaldrugs. Some items may interact with your medicine. What should I watch for while using this medication? Visit your care team regularly. You may need blood work done while you aretaking this medication. You may need to follow a special diet. Talk to your care team. Limit youralcohol intake and avoid smoking to get the best benefit. What side effects may I notice from receiving this medication? Side effects that you should report to your care team as soon as possible: Allergic reactions-skin rash, itching, hives, swelling of the  face, lips, tongue, or throat Swelling of the ankles, hands, or feet Trouble breathing Side effects that usually do not require medical attention (report to your careteam if they continue or are bothersome): Diarrhea This list may not describe all possible side effects. Call your doctor for medical advice about side effects. You may report side effects to FDA at1-800-FDA-1088. Where should I keep my medication? Keep out of the reach of children. Store at room temperature between 15 and 30 degrees C (59 and 85 degrees F).Protect from light. Throw away any unused medication after the expiration date. NOTE: This sheet is a summary. It may not cover all possible information. If you have questions about this medicine, talk to your doctor, pharmacist, orhealth care provider.  2022 Elsevier/Gold Standard (2020-02-24 11:47:06)

## 2020-08-27 ENCOUNTER — Ambulatory Visit: Payer: BC Managed Care – PPO

## 2020-08-28 ENCOUNTER — Inpatient Hospital Stay: Payer: BC Managed Care – PPO

## 2020-08-28 VITALS — BP 121/77 | HR 87 | Temp 96.8°F | Resp 20

## 2020-08-28 DIAGNOSIS — E611 Iron deficiency: Secondary | ICD-10-CM | POA: Diagnosis not present

## 2020-08-28 MED ORDER — IRON SUCROSE 20 MG/ML IV SOLN
200.0000 mg | Freq: Once | INTRAVENOUS | Status: AC
  Administered 2020-08-28: 200 mg via INTRAVENOUS
  Filled 2020-08-28: qty 10

## 2020-08-28 MED ORDER — SODIUM CHLORIDE 0.9 % IV SOLN: 200.0000 mg | Freq: Once | INTRAVENOUS | Status: DC

## 2020-08-28 MED ORDER — SODIUM CHLORIDE 0.9 % IV SOLN
Freq: Once | INTRAVENOUS | Status: AC
  Filled 2020-08-28: qty 250

## 2020-08-28 NOTE — Patient Instructions (Signed)

## 2020-08-28 NOTE — Progress Notes (Signed)
Patient tolerated Venofer infusion well today, no concerns voiced. Patient discharged. Stable.  ° °

## 2020-09-03 ENCOUNTER — Inpatient Hospital Stay: Payer: BC Managed Care – PPO

## 2020-09-03 ENCOUNTER — Other Ambulatory Visit: Payer: Self-pay

## 2020-09-03 VITALS — BP 111/71 | HR 70 | Temp 96.3°F | Resp 18

## 2020-09-03 DIAGNOSIS — E611 Iron deficiency: Secondary | ICD-10-CM | POA: Diagnosis not present

## 2020-09-03 MED ORDER — SODIUM CHLORIDE 0.9 % IV SOLN: 200.0000 mg | Freq: Once | INTRAVENOUS | Status: DC

## 2020-09-03 MED ORDER — SODIUM CHLORIDE 0.9 % IV SOLN
Freq: Once | INTRAVENOUS | Status: AC
  Filled 2020-09-03: qty 250

## 2020-09-03 MED ORDER — IRON SUCROSE 20 MG/ML IV SOLN
200.0000 mg | Freq: Once | INTRAVENOUS | Status: AC
  Administered 2020-09-03: 200 mg via INTRAVENOUS
  Filled 2020-09-03: qty 10

## 2020-09-03 MED ORDER — CYANOCOBALAMIN 1000 MCG/ML IJ SOLN
1000.0000 ug | Freq: Once | INTRAMUSCULAR | Status: AC
  Administered 2020-09-03: 1000 ug via INTRAMUSCULAR
  Filled 2020-09-03: qty 1

## 2020-09-03 NOTE — Patient Instructions (Signed)

## 2020-11-09 ENCOUNTER — Inpatient Hospital Stay: Payer: BC Managed Care – PPO | Admitting: Internal Medicine

## 2020-11-09 ENCOUNTER — Inpatient Hospital Stay: Payer: BC Managed Care – PPO

## 2020-11-09 ENCOUNTER — Encounter: Payer: Self-pay | Admitting: Internal Medicine

## 2020-11-09 ENCOUNTER — Inpatient Hospital Stay: Payer: BC Managed Care – PPO | Attending: Internal Medicine

## 2020-11-09 ENCOUNTER — Other Ambulatory Visit: Payer: Self-pay

## 2020-11-09 VITALS — BP 105/70 | HR 73 | Resp 18

## 2020-11-09 VITALS — BP 115/79 | HR 74 | Temp 98.7°F | Resp 18 | Wt 192.2 lb

## 2020-11-09 DIAGNOSIS — M069 Rheumatoid arthritis, unspecified: Secondary | ICD-10-CM | POA: Diagnosis not present

## 2020-11-09 DIAGNOSIS — E611 Iron deficiency: Secondary | ICD-10-CM

## 2020-11-09 DIAGNOSIS — E538 Deficiency of other specified B group vitamins: Secondary | ICD-10-CM | POA: Insufficient documentation

## 2020-11-09 DIAGNOSIS — R760 Raised antibody titer: Secondary | ICD-10-CM

## 2020-11-09 LAB — CBC WITH DIFFERENTIAL/PLATELET
Abs Immature Granulocytes: 0.02 10*3/uL (ref 0.00–0.07)
Basophils Absolute: 0 10*3/uL (ref 0.0–0.1)
Basophils Relative: 1 %
Eosinophils Absolute: 0.1 10*3/uL (ref 0.0–0.5)
Eosinophils Relative: 4 %
HCT: 37.7 % (ref 36.0–46.0)
Hemoglobin: 12.5 g/dL (ref 12.0–15.0)
Immature Granulocytes: 1 %
Lymphocytes Relative: 41 %
Lymphs Abs: 1.4 10*3/uL (ref 0.7–4.0)
MCH: 29.6 pg (ref 26.0–34.0)
MCHC: 33.2 g/dL (ref 30.0–36.0)
MCV: 89.3 fL (ref 80.0–100.0)
Monocytes Absolute: 0.4 10*3/uL (ref 0.1–1.0)
Monocytes Relative: 13 %
Neutro Abs: 1.4 10*3/uL — ABNORMAL LOW (ref 1.7–7.7)
Neutrophils Relative %: 40 %
Platelets: 292 10*3/uL (ref 150–400)
RBC: 4.22 MIL/uL (ref 3.87–5.11)
RDW: 14.4 % (ref 11.5–15.5)
WBC: 3.4 10*3/uL — ABNORMAL LOW (ref 4.0–10.5)
nRBC: 0 % (ref 0.0–0.2)

## 2020-11-09 MED ORDER — CYANOCOBALAMIN 1000 MCG/ML IJ SOLN
1000.0000 ug | Freq: Once | INTRAMUSCULAR | Status: AC
  Administered 2020-11-09: 1000 ug via INTRAMUSCULAR
  Filled 2020-11-09: qty 1

## 2020-11-09 MED ORDER — IRON SUCROSE 20 MG/ML IV SOLN
200.0000 mg | Freq: Once | INTRAVENOUS | Status: AC
  Administered 2020-11-09: 200 mg via INTRAVENOUS
  Filled 2020-11-09: qty 10

## 2020-11-09 MED ORDER — SODIUM CHLORIDE 0.9 % IV SOLN
Freq: Once | INTRAVENOUS | Status: AC
  Filled 2020-11-09: qty 250

## 2020-11-09 MED ORDER — SODIUM CHLORIDE 0.9 % IV SOLN: 200.0000 mg | Freq: Once | INTRAVENOUS | Status: DC

## 2020-11-09 NOTE — Progress Notes (Signed)
[facility] NOTE  Patient Care Team: Elgie Collard, MD as PCP - General (Obstetrics and Gynecology)  CHIEF COMPLAINTS/PURPOSE OF CONSULTATION: Iron/B12 deficiency  #Iron deficiency/B12 deficiency-gastric bypass; poor tolerance to p.o. iron.  # 2002- PE [2 weeks post partum] x18 months- coumadin; 2009- SVT- right chest wall- [[facility]]-short-term anticoagulation;  # ANTICRADIOLIPIN ANTIBODY[rheumatology evaluation; Dr.Patel];-08/2019--Pos: Anticardiolipin Ab IgM; Neg: Anticardiolipin Ab IgG, Beta-2Glyco 1 IgA, IgM, IgG, No lupus anticoagulant;   # Rheumatoid arthritis, stage 1; Diagnosed 08/2019; Positive RF; synovitis of the joint; elevated sed rate'l  Continue HCQ 200 mg BID  # Hx of gastric Bypass; hx of COVID infection admitted [Jan 2021]    Oncology History   No history exists.   HISTORY OF PRESENTING ILLNESS: Alone.  Ambulating independently.    Debbie Sosa 45 y.o.  female with iron deficiency/B12 deficiency gastric bypass is here for follow-up.   Patient received iron infusions-last 1 in August 2022.  She notes having good improvement of energy levels.  She is not as tired as she used to be.  Poor tolerance of p.o. iron.   Review of Systems  Constitutional:  Positive for malaise/fatigue. Negative for chills, diaphoresis, fever and weight loss.  HENT:  Negative for nosebleeds and sore throat.   Eyes:  Negative for double vision.  Respiratory:  Negative for cough, hemoptysis, sputum production, shortness of breath and wheezing.   Cardiovascular:  Negative for chest pain, palpitations, orthopnea and leg swelling.  Gastrointestinal:  Negative for abdominal pain, blood in stool, constipation, diarrhea, heartburn, melena, nausea and vomiting.  Genitourinary:  Negative for dysuria, frequency and urgency.  Musculoskeletal:  Positive for back pain and joint pain.  Skin: Negative.  Negative for itching and rash.  Neurological:  Negative for dizziness,  tingling, focal weakness, weakness and headaches.  Endo/Heme/Allergies:  Does not bruise/bleed easily.  Psychiatric/Behavioral:  Negative for depression. The patient is not nervous/anxious and does not have insomnia.     MEDICAL HISTORY:  Past Medical History:  Diagnosis Date   Asthma    Pulmonary embolism ([place])     SURGICAL HISTORY: Past Surgical History:  Procedure Laterality Date   BREAST BIOPSY Left 2006   benign/clip   BREAST BIOPSY Right 2015   benign/clip   BREAST REDUCTION SURGERY     CESAREAN SECTION     CESAREAN SECTION     CHOLECYSTECTOMY     COLONOSCOPY     COLONOSCOPY WITH PROPOFOL N/A 10/31/2018   Procedure: COLONOSCOPY WITH PROPOFOL;  Surgeon: [name], MD;  Location[facility] ENDOSCOPY;  Service: Gastroenterology;  Laterality: N/A;   ESOPHAGOGASTRODUODENOSCOPY (EGD) WITH PROPOFOL N/A 10/04/2016   Procedure: ESOPHAGOGASTRODUODENOSCOPY (EGD) WITH PROPOFOL;  Surgeon: [name], MD;  Location: [facility] ENDOSCOPY;  Service: Gastroenterology;  Laterality: N/A;   ESOPHAGOGASTRODUODENOSCOPY (EGD) WITH PROPOFOL N/A 04/14/2017   Procedure: ESOPHAGOGASTRODUODENOSCOPY (EGD) WITH PROPOFOL;  Surgeon: [name], MD;  Location: [facility] ENDOSCOPY;  Service: Gastroenterology;  Laterality: N/A;   ESOPHAGOGASTRODUODENOSCOPY (EGD) WITH PROPOFOL N/A 10/31/2018   Procedure: ESOPHAGOGASTRODUODENOSCOPY (EGD) WITH PROPOFOL;  Surgeon: [name], MD;  Location: [facility] ENDOSCOPY;  Service: Gastroenterology;  Laterality: N/A;   ESOPHAGOGASTRODUODENOSCOPY (EGD) WITH PROPOFOL N/A 06/28/2019   Procedure: ESOPHAGOGASTRODUODENOSCOPY (EGD) WITH PROPOFOL;  Surgeon: [name], MD;  Location: [facility] ENDOSCOPY;  Service: Gastroenterology;  Laterality: N/A;   GASTRIC BYPASS     01/2015   KNEE SURGERY     NASAL SINUS SURGERY     PLANTAR FASCIA SURGERY     x 3   REDUCTION MAMMAPLASTY  Bilateral 2005    SOCIAL HISTORY: Social History   Socioeconomic History   Marital status: Single    Spouse name: Not on  file   Number of children: Not on file   Years of education: Not on file   Highest education level: Not on file  Occupational History   Not on file  Tobacco Use   Smoking status: Never   Smokeless tobacco: Never  Vaping Use   Vaping Use: Never used  Substance and Sexual Activity   Alcohol use: No   Drug use: No   Sexual activity: Not on file  Other Topics Concern   Not on file  Social History Narrative   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    Social Determinants of Radio broadcast assistant Strain: Not on file  Food Insecurity: Not on file  Transportation Needs: Not on file  Physical Activity: Not on file  Stress: Not on file  Social Connections: Not on file  Intimate Partner Violence: Not on file    FAMILY HISTORY: Family History  Problem Relation Age of Onset   Breast cancer Mother 10   Breast cancer Cousin 66   Breast cancer Cousin 61   Asthma Father    Asthma Sister    Asthma Child     ALLERGIES:  is allergic to cephalexin, fexofenadine, ketorolac, morphine, phenazopyridine, promethazine, cetirizine, contrast media [iodinated diagnostic agents], latex, morphine and related, other, pyridium [phenazopyridine hcl], tramadol, vioxx [rofecoxib], hepatitis b virus vaccines, naproxen, oxycodone-acetaminophen, and sulfa antibiotics.  MEDICATIONS:  Current Outpatient Medications  Medication Sig Dispense Refill   albuterol (VENTOLIN HFA) 108 (90 Base) MCG/ACT inhaler Inhale 1-2 puffs INH Q4-6hr prn for chest tightness, cough, wheezing, SOB/DOE. 18 g 0   budesonide-formoterol (SYMBICORT) 160-4.5 MCG/ACT inhaler Inhale 2 puffs into the lungs in the morning and at bedtime. 1 each 12   cyanocobalamin (,VITAMIN B-12,) 1000 MCG/ML injection Inject into the muscle.     montelukast (SINGULAIR) 10 MG tablet Take 10 mg by mouth daily.      Vitamin D, Ergocalciferol, (DRISDOL) 1.25 MG (50000 UT)  CAPS capsule Take 50,000 Units by mouth once a week.     cyclobenzaprine (FLEXERIL) 5 MG tablet 1-2 tab po Q8 hrs prn muscle pain or spasm. (Patient not taking: Reported on 11/09/2020)     EPINEPHrine (EPIPEN 2-PAK) 0.3 mg/0.3 mL IJ SOAJ injection Inject 0.3 mLs (0.3 mg total) into the muscle as needed for anaphylaxis. (Patient not taking: Reported on 11/09/2020) 1 each 1   gabapentin (NEURONTIN) 300 MG capsule Take 1 capsule by mouth 2 (two) times daily. (Patient not taking: Reported on 11/09/2020)     hydroxychloroquine (PLAQUENIL) 200 MG tablet Take by mouth. (Patient not taking: Reported on 11/09/2020)     nitrofurantoin, macrocrystal-monohydrate, (MACROBID) 100 MG capsule Take 100 mg by mouth 2 (two) times daily.     phentermine (ADIPEX-P) 37.5 MG tablet Take 37.5 mg by mouth every morning. (Patient not taking: Reported on 11/09/2020)     tiZANidine (ZANAFLEX) 4 MG capsule Take 4 mg by mouth 2 (two) times daily as needed. (Patient not taking: Reported on 11/09/2020)     No current facility-administered medications for this visit.      Marland Kitchen  PHYSICAL EXAMINATION:  Vitals:   11/09/20 1258  BP: 115/79  Pulse: 74  Resp: 18  Temp: 98.7 F (37.1 C)  SpO2: 99%   Filed Weights   11/09/20  1258  Weight: 192 lb 3.2 oz (87.2 kg)    Physical Exam HENT:     Head: Normocephalic and atraumatic.     Mouth/Throat:     Pharynx: No oropharyngeal exudate.  Eyes:     Pupils: Pupils are equal, round, and reactive to light.  Cardiovascular:     Rate and Rhythm: Normal rate and regular rhythm.  Pulmonary:     Effort: No respiratory distress.     Breath sounds: No wheezing.  Abdominal:     General: Bowel sounds are normal. There is no distension.     Palpations: Abdomen is soft. There is no mass.     Tenderness: There is no abdominal tenderness. There is no guarding or rebound.  Musculoskeletal:        General: No tenderness. Normal range of motion.     Cervical back: Normal range of motion  and neck supple.  Skin:    General: Skin is warm.  Neurological:     Mental Status: She is alert and oriented to person, place, and time.  Psychiatric:        Mood and Affect: Affect normal.     LABORATORY DATA:  I have reviewed the data as listed Lab Results  Component Value Date   WBC 3.4 (L) 11/09/2020   HGB 12.5 11/09/2020   HCT 37.7 11/09/2020   MCV 89.3 11/09/2020   PLT 292 11/09/2020   Recent Labs    07/27/20 0805  NA 135  K 3.9  CL 108  CO2 22  GLUCOSE 89  BUN 13  CREATININE 0.73  CALCIUM 8.7*  GFRNONAA >60  PROT 7.6  ALBUMIN 3.6  AST 21  ALT 19  ALKPHOS 75  BILITOT 0.4    RADIOGRAPHIC STUDIES: I have personally reviewed the radiological images as listed and agreed with the findings in the report. No results found.   ASSESSMENT & PLAN:   Iron deficiency # Iron def/ B12 def- gastric bypass/-B12 deficiency on B12 injection-July 2022-ferritin 6/iron deficiency-s/p IV iron infusion tolerated well.  Improved energy levels.  Proceed with B12/iron infusions every 3 to 4 months.  # RA- poorly controlled- refer to Dr.Patel [s/p arthro- sec to RA]-   # DISPOSITION: # venofer/ B12 today # Follow up MD 4 months- labs- cbc/possible venofer; B12 injection-dr.B # Dr.B  All questions were answered. The patient knows to call the clinic with any problems, questions or concerns.   Cammie Sickle, MD 11/09/2020 1:35 PM

## 2020-11-09 NOTE — Patient Instructions (Signed)
[facility]COL[facility]   Discharge Instructions: Thank you for choosing [facility] to provide your oncology and hematology care.  If you have a lab appointment with the [facility], please go directly to the [facility] and check in at the registration area.  We strive to give you quality time with your provider. You may need to reschedule your appointment if you arrive late (15 or more minutes).  Arriving late affects you and other patients whose appointments are after yours.  Also, if you miss three or more appointments without notifying the office, you may be dismissed from the clinic at the provider's discretion.      For prescription refill requests, have your pharmacy contact our office and allow 72 hours for refills to be completed.    Today you received the following: Venofer and Vitamin B-12 injection.      BELOW ARE SYMPTOMS THAT SHOULD BE REPORTED IMMEDIATELY: *FEVER GREATER THAN 100.4 F (38 C) OR HIGHER *CHILLS OR SWEATING *NAUSEA AND VOMITING THAT IS NOT CONTROLLED WITH YOUR NAUSEA MEDICATION *UNUSUAL SHORTNESS OF BREATH *UNUSUAL BRUISING OR BLEEDING *URINARY PROBLEMS (pain or burning when urinating, or frequent urination) *BOWEL PROBLEMS (unusual diarrhea, constipation, pain near the anus) TENDERNESS IN MOUTH AND THROAT WITH OR WITHOUT PRESENCE OF ULCERS (sore throat, sores in mouth, or a toothache) UNUSUAL RASH, SWELLING OR PAIN  UNUSUAL VAGINAL DISCHARGE OR ITCHING   Items with * indicate a potential emergency and should be followed up as soon as possible or go to the Emergency Department if any problems should occur.  Should you have questions after your visit or need to cancel or reschedule your appointment, please contact [facility]  [phone] and follow the prompts.  Office hours are 8:00 a.m. to 4:30 p.m. Monday - Friday. Please note that voicemails left after 4:00 p.m. may not be  returned until the following business day.  We are closed weekends and major holidays. You have access to a nurse at all times for urgent questions. Please call the main number to the clinic [phone] and follow the prompts.  For any non-urgent questions, you may also contact your provider using MyChart. We now offer e-Visits for anyone 71 and older to request care online for non-urgent symptoms. For details visit [email or web address].   Also download the MyChart app! Go to the app store, search "MyChart", open the app, select [facility], and log in with your MyChart username and password.  Due to Covid, a mask is required upon entering the hospital/clinic. If you do not have a mask, one will be given to you upon arrival. For doctor visits, patients may have 1 support person aged 33 or older with them. For treatment visits, patients cannot have anyone with them due to current Covid guidelines and our immunocompromised population.

## 2020-11-09 NOTE — Assessment & Plan Note (Signed)
#   Iron def/ B12 def- gastric bypass/-B12 deficiency on B12 injection-July 2022-ferritin 6/iron deficiency-s/p IV iron infusion tolerated well.  Improved energy levels.  Proceed with B12/iron infusions every 3 to 4 months.  # RA- poorly controlled- refer to Dr.Patel [s/p arthro- sec to RA]-   # DISPOSITION: # venofer/ B12 today # Follow up MD 4 months- labs- cbc/possible venofer; B12 injection-dr.B # Dr.B

## 2020-11-30 ENCOUNTER — Other Ambulatory Visit: Payer: Self-pay | Admitting: Obstetrics and Gynecology

## 2020-11-30 DIAGNOSIS — Z1231 Encounter for screening mammogram for malignant neoplasm of breast: Secondary | ICD-10-CM

## 2020-12-30 ENCOUNTER — Ambulatory Visit: Payer: BC Managed Care – PPO | Admitting: Gastroenterology

## 2021-01-13 ENCOUNTER — Ambulatory Visit
Admission: RE | Admit: 2021-01-13 | Discharge: 2021-01-13 | Disposition: A | Discharge status: Home / Self Care | Payer: BC Managed Care – PPO | Source: Ambulatory Visit | Attending: Obstetrics and Gynecology | Admitting: Obstetrics and Gynecology

## 2021-01-13 ENCOUNTER — Other Ambulatory Visit: Payer: Self-pay

## 2021-01-13 DIAGNOSIS — Z1231 Encounter for screening mammogram for malignant neoplasm of breast: Secondary | ICD-10-CM | POA: Diagnosis present

## 2021-01-26 ENCOUNTER — Other Ambulatory Visit: Payer: Self-pay

## 2021-01-27 ENCOUNTER — Other Ambulatory Visit: Payer: Self-pay

## 2021-01-27 ENCOUNTER — Ambulatory Visit (INDEPENDENT_AMBULATORY_CARE_PROVIDER_SITE_OTHER): Payer: BC Managed Care – PPO | Admitting: Gastroenterology

## 2021-01-27 ENCOUNTER — Encounter: Payer: Self-pay | Admitting: Gastroenterology

## 2021-01-27 VITALS — BP 123/79 | HR 80 | Temp 98.3°F | Ht 61.5 in | Wt 202.0 lb

## 2021-01-27 DIAGNOSIS — Z9884 Bariatric surgery status: Secondary | ICD-10-CM | POA: Diagnosis not present

## 2021-01-27 DIAGNOSIS — R1310 Dysphagia, unspecified: Secondary | ICD-10-CM

## 2021-01-27 MED ORDER — OMEPRAZOLE 40 MG PO CPDR: 40.0000 mg | DELAYED_RELEASE_CAPSULE | Freq: Two times a day (BID) | ORAL | 3 refills | Status: DC

## 2021-01-27 NOTE — Progress Notes (Signed)
[name] MD, MRCP(U.K) [address]  [place]  [place], [place] [address]  Main: [phone]  Fax: [phone]   Primary Care Physician: Elgie Collard, MD  Primary Gastroenterologist:  Dr. [name]   Chief complaint: Issues with her esophagus   HPI: Debbie Sosa is a 46 y.o. female  Summary of history :    She has a history of diverticulitis in 2018.  History of gastric bypass.  History of abdominal pain likely secondary to IBS constipation and she did not have abdominal pain after cleaning out out process of a colonoscopy   03/2017: EGD- normal gastric by pass anatomy .  Biopsies of stomach showed atrophic gastritis and a repeat EGD in 3 years 09/05/2016: H. pylori stool antigen negative 10/31/2018: EGD: Normal-appearing Roux-en-Y gastric bypass.  Gastric biopsies showed intestinal metaplasia.  Colonoscopy: 3 hyperplastic polyps were resected.  12/26/2018: CT scan of the abdomen and pelvis with contrast no obstruction 01/15/2019: Abdominal x-ray for abdominal pain showed large volume of stool in ascending and transverse colon  11/23/2018: H. pylori stool antigen negative    Interval history  02/13/2019-01/27/2021 06/28/2019: EGD: Patient is of gastric bypass noted.  Biopsies of stomach showed atrophic gastritis and intestinal metaplasia.  Repeat EGD in 2024 June 11/09/2020 hemoglobin 12.5 g. She sees Dr. Rogue Bussing for iron deficiency and B12 deficiency secondary to gastric bypass and received IV iron.  She states that since Thanksgiving she is having some pain when she swallows.  Does not matter for solids or liquids.  She takes omeprazole 40 mg once a day but it does not seem to have helped.  She does give a history of regurgitation at night.  She has had vertigo at night.  Denies any dysphagia.   Current Outpatient Medications  Medication Sig Dispense Refill   albuterol (VENTOLIN HFA) 108 (90 Base) MCG/ACT inhaler Inhale 1-2 puffs INH Q4-6hr prn for  chest tightness, cough, wheezing, SOB/DOE. 18 g 0   budesonide-formoterol (SYMBICORT) 160-4.5 MCG/ACT inhaler Inhale 2 puffs into the lungs in the morning and at bedtime. 1 each 12   cyanocobalamin (,VITAMIN B-12,) 1000 MCG/ML injection Inject into the muscle.     cyclobenzaprine (FLEXERIL) 5 MG tablet      EPINEPHrine (ADRENALIN) 1 MG/ML SOLN injection See admin instructions.     EPINEPHrine (EPIPEN 2-PAK) 0.3 mg/0.3 mL IJ SOAJ injection Inject 0.3 mLs (0.3 mg total) into the muscle as needed for anaphylaxis. 1 each 1   gabapentin (NEURONTIN) 300 MG capsule Take 1 capsule by mouth 2 (two) times daily.     hydroxychloroquine (PLAQUENIL) 200 MG tablet Take by mouth.     montelukast (SINGULAIR) 10 MG tablet Take 10 mg by mouth daily.      nitrofurantoin, macrocrystal-monohydrate, (MACROBID) 100 MG capsule Take 100 mg by mouth 2 (two) times daily.     omeprazole (PRILOSEC) 40 MG capsule Take 1 capsule (40 mg total) by mouth in the morning and at bedtime. 180 capsule 3   phentermine (ADIPEX-P) 37.5 MG tablet Take 37.5 mg by mouth every morning.     tiZANidine (ZANAFLEX) 4 MG capsule Take 4 mg by mouth 2 (two) times daily as needed.     Vitamin D, Ergocalciferol, (DRISDOL) 1.25 MG (50000 UT) CAPS capsule Take 50,000 Units by mouth once a week.     No current facility-administered medications for this visit.    Allergies as of 01/27/2021 - Review Complete 01/27/2021  Allergen Reaction Noted   Cephalexin Hives and Rash  03/12/2014   Fexofenadine Shortness Of Breath    Iodinated contrast media Hives 05/23/2016   Ketorolac Hives 03/12/2014   Morphine Anaphylaxis 07/01/2013   Phenazopyridine Rash    Promethazine Hives 03/12/2014   Cetirizine Other (See Comments) 03/05/2018   Latex  06/18/2014   Morphine and related  06/18/2014   Other Hives 02/23/2013   Pyridium [phenazopyridine hcl]  06/18/2014   Tramadol Hives 06/18/2014   Vioxx [rofecoxib]  06/18/2014   Hepatitis b virus vaccines Rash  07/01/2013   Naproxen Rash 07/01/2013   Oxycodone-acetaminophen Itching 03/12/2014   Sulfa antibiotics Rash 06/18/2014   Sulfasalazine Rash 06/18/2014    ROS:  General: Negative for anorexia, weight loss, fever, chills, fatigue, weakness. ENT: Negative for hoarseness, difficulty swallowing , nasal congestion. CV: Negative for chest pain, angina, palpitations, dyspnea on exertion, peripheral edema.  Respiratory: Negative for dyspnea at rest, dyspnea on exertion, cough, sputum, wheezing.  GI: See history of present illness. GU:  Negative for dysuria, hematuria, urinary incontinence, urinary frequency, nocturnal urination.  Endo: Negative for unusual weight change.    Physical Examination:   BP 123/79    Pulse 80    Temp 98.3 F (36.8 C) (Oral)    Ht 5' 1.5" (1.562 m)    Wt 202 lb (91.6 kg)    BMI 37.55 kg/m   General: Well-nourished, well-developed in no acute distress.  Eyes: No icterus. Conjunctivae pink. Mouth: Oropharyngeal mucosa moist and pink , no lesions erythema or exudate. Lungs: Clear to auscultation bilaterally. Non-labored. Heart: Regular rate and rhythm, no murmurs rubs or gallops.  Abdomen: Bowel sounds are normal, nontender, nondistended, no hepatosplenomegaly or masses, no abdominal bruits or hernia , no rebound or guarding.   Extremities: No lower extremity edema. No clubbing or deformities. Neuro: Alert and oriented x 3.  Grossly intact. Skin: Warm and dry, no jaundice.   Psych: Alert and cooperative, normal mood and affect.   Imaging Studies: MM 3D SCREEN BREAST BILATERAL  Result Date: 01/14/2021 CLINICAL DATA:  Screening. EXAM: DIGITAL SCREENING BILATERAL MAMMOGRAM WITH TOMOSYNTHESIS AND CAD TECHNIQUE: Bilateral screening digital craniocaudal and mediolateral oblique mammograms were obtained. Bilateral screening digital breast tomosynthesis was performed. The images were evaluated with computer-aided detection. COMPARISON:  Previous exam(s). ACR Breast  Density Category b: There are scattered areas of fibroglandular density. FINDINGS: There are no findings suspicious for malignancy. IMPRESSION: No mammographic evidence of malignancy. A result letter of this screening mammogram will be mailed directly to the patient. RECOMMENDATION: Screening mammogram in one year. (Code:SM-B-01Y) BI-RADS CATEGORY  1: Negative. Electronically Signed   By: Dorise Bullion III M.D.   On: 01/14/2021 10:23   Assessment and Plan:   Debbie Sosa is a 46 y.o. y/o female here to see me for issues of her esophagus.  History of IBS constipation and a mass prior history of Roux-en-Y gastric bypass, gastric intestinal metaplasia.  she does not have a gallbladder.  EGD demonstrated gastric intestinal metaplasia in October 2020 and colonoscopy showed no gross abnormalities except hyperplastic polyps.  Presently has symptoms of odynophagia not responding to PPI   Plan 1.   Increase Prilosec to 40 mg twice a day. 2.  Since she has developed odynophagia and has not responded to Prilosec 40 mg once a day I believe the next  step would be to evaluate for Candida esophagitis or reflux esophagitis, eosinophilic esophagitis.  If negative may need manometry or pH testing.  I have discussed alternative options, risks & benefits,  which include, but  are not limited to, bleeding, infection, perforation,respiratory complication & drug reaction.  The patient agrees with this plan & written consent will be obtained.    Dr [name]  MD,MRCP [facility]) Follow up in 6 weeks

## 2021-02-05 DIAGNOSIS — D508 Other iron deficiency anemias: Secondary | ICD-10-CM | POA: Insufficient documentation

## 2021-02-09 ENCOUNTER — Encounter: Payer: Self-pay | Admitting: Gastroenterology

## 2021-02-10 ENCOUNTER — Ambulatory Visit
Admission: RE | Admit: 2021-02-10 | Discharge: 2021-02-10 | Disposition: A | Discharge status: Home / Self Care | Payer: BC Managed Care – PPO | Attending: Gastroenterology | Admitting: Gastroenterology

## 2021-02-10 ENCOUNTER — Ambulatory Visit: Payer: BC Managed Care – PPO | Admitting: Anesthesiology

## 2021-02-10 ENCOUNTER — Encounter: Payer: Self-pay | Admitting: Gastroenterology

## 2021-02-10 ENCOUNTER — Encounter
Admission: RE | Disposition: A | Discharge status: Home / Self Care | Payer: Self-pay | Source: Home / Self Care | Attending: Gastroenterology

## 2021-02-10 DIAGNOSIS — J45909 Unspecified asthma, uncomplicated: Secondary | ICD-10-CM | POA: Insufficient documentation

## 2021-02-10 DIAGNOSIS — K219 Gastro-esophageal reflux disease without esophagitis: Secondary | ICD-10-CM | POA: Diagnosis not present

## 2021-02-10 DIAGNOSIS — R1310 Dysphagia, unspecified: Secondary | ICD-10-CM | POA: Insufficient documentation

## 2021-02-10 DIAGNOSIS — Z980 Intestinal bypass and anastomosis status: Secondary | ICD-10-CM | POA: Diagnosis not present

## 2021-02-10 HISTORY — PX: ESOPHAGOGASTRODUODENOSCOPY (EGD) WITH PROPOFOL: SHX5813

## 2021-02-10 LAB — KOH PREP
KOH Prep: NONE SEEN
Special Requests: NORMAL

## 2021-02-10 LAB — POCT PREGNANCY, URINE: Preg Test, Ur: NEGATIVE

## 2021-02-10 SURGERY — ESOPHAGOGASTRODUODENOSCOPY (EGD) WITH PROPOFOL
Anesthesia: General

## 2021-02-10 MED ORDER — PROPOFOL 10 MG/ML IV BOLUS
INTRAVENOUS | Status: DC | PRN
  Administered 2021-02-10: 60 mg via INTRAVENOUS

## 2021-02-10 MED ORDER — LIDOCAINE HCL (CARDIAC) PF 100 MG/5ML IV SOSY
PREFILLED_SYRINGE | INTRAVENOUS | Status: DC | PRN
  Administered 2021-02-10: 50 mg via INTRAVENOUS

## 2021-02-10 MED ORDER — SODIUM CHLORIDE 0.9 % IV SOLN
INTRAVENOUS | Status: DC
  Administered 2021-02-10: 11:00:00 1000 mL via INTRAVENOUS

## 2021-02-10 MED ORDER — PROPOFOL 500 MG/50ML IV EMUL
INTRAVENOUS | Status: DC | PRN
  Administered 2021-02-10: 150 ug/kg/min via INTRAVENOUS

## 2021-02-10 NOTE — Anesthesia Procedure Notes (Signed)
Date/Time: 02/10/2021 11:46 AM Performed by: Johnna Acosta, CRNA Pre-anesthesia Checklist: Patient identified, Emergency Drugs available, Suction available, Patient being monitored and Timeout performed Patient Re-evaluated:Patient Re-evaluated prior to induction Oxygen Delivery Method: Nasal cannula Preoxygenation: Pre-oxygenation with 100% oxygen Induction Type: IV induction

## 2021-02-10 NOTE — H&P (Signed)
[name], MD [address] [place], [place], [place], [id number] [place], [place], [place], [address] Phone: [phone]  Fax: [phone]  Primary Care Physician:  Idelle Crouch, MD   Pre-Procedure History & Physical: HPI:  Debbie Sosa is a 46 y.o. female is here for an endoscopy    Past Medical History:  Diagnosis Date   Asthma    Pulmonary embolism [facility]     Past Surgical History:  Procedure Laterality Date   BREAST BIOPSY Left 2006   benign/clip   BREAST BIOPSY Right 2015   benign/clip   BREAST REDUCTION SURGERY     CESAREAN SECTION     CESAREAN SECTION     CHOLECYSTECTOMY     COLONOSCOPY     COLONOSCOPY WITH PROPOFOL N/A 10/31/2018   Procedure: COLONOSCOPY WITH PROPOFOL;  Surgeon: [name], MD;  Location: [facility] ENDOSCOPY;  Service: Gastroenterology;  Laterality: N/A;   ESOPHAGOGASTRODUODENOSCOPY (EGD) WITH PROPOFOL N/A 10/04/2016   Procedure: ESOPHAGOGASTRODUODENOSCOPY (EGD) WITH PROPOFOL;  Surgeon: [name], MD;  Location: [facility] ENDOSCOPY;  Service: Gastroenterology;  Laterality: N/A;   ESOPHAGOGASTRODUODENOSCOPY (EGD) WITH PROPOFOL N/A 04/14/2017   Procedure: ESOPHAGOGASTRODUODENOSCOPY (EGD) WITH PROPOFOL;  Surgeon: [name], MD;  Location: [facility] ENDOSCOPY;  Service: Gastroenterology;  Laterality: N/A;   ESOPHAGOGASTRODUODENOSCOPY (EGD) WITH PROPOFOL N/A 10/31/2018   Procedure: ESOPHAGOGASTRODUODENOSCOPY (EGD) WITH PROPOFOL;  Surgeon: [name], MD;  Location: [facility] ENDOSCOPY;  Service: Gastroenterology;  Laterality: N/A;   ESOPHAGOGASTRODUODENOSCOPY (EGD) WITH PROPOFOL N/A 06/28/2019   Procedure: ESOPHAGOGASTRODUODENOSCOPY (EGD) WITH PROPOFOL;  Surgeon: [name], MD;  Location: [facility] ENDOSCOPY;  Service: Gastroenterology;  Laterality: N/A;   GASTRIC BYPASS     01/2015   KNEE SURGERY     NASAL SINUS SURGERY     PLANTAR FASCIA SURGERY     x 3   REDUCTION MAMMAPLASTY Bilateral 2005    Prior to Admission medications    Medication Sig Start Date End Date Taking? Authorizing Provider  cyanocobalamin (,VITAMIN B-12,) 1000 MCG/ML injection Inject into the muscle. 01/13/20  Yes [provider]  cyclobenzaprine (FLEXERIL) 5 MG tablet  05/29/20  Yes [provider]  hydroxychloroquine (PLAQUENIL) 200 MG tablet Take by mouth. 01/06/20  Yes [provider]  montelukast (SINGULAIR) 10 MG tablet Take 10 mg by mouth daily.  08/20/15  Yes [provider]  omeprazole (PRILOSEC) 40 MG capsule Take 1 capsule (40 mg total) by mouth in the morning and at bedtime. 01/27/21  Yes [name], MD  tiZANidine (ZANAFLEX) 4 MG capsule Take 4 mg by mouth 2 (two) times daily as needed. 05/14/20  Yes [provider]  Vitamin D, Ergocalciferol, (DRISDOL) 1.25 MG (50000 UT) CAPS capsule Take 50,000 Units by mouth once a week. 11/28/18  Yes [provider]  albuterol (VENTOLIN HFA) 108 (90 Base) MCG/ACT inhaler Inhale 1-2 puffs INH Q4-6hr prn for chest tightness, cough, wheezing, SOB/DOE. 01/10/19   Jaynee Eagles, PA-C  budesonide-formoterol [facility]) 160-4.5 MCG/ACT inhaler Inhale 2 puffs into the lungs in the morning and at bedtime. Patient not taking: Reported on 02/10/2021 09/30/19   Spero Geralds, MD  EPINEPHrine (ADRENALIN) 1 MG/ML SOLN injection See admin instructions. 01/07/21   [provider]  EPINEPHrine (EPIPEN 2-PAK) 0.3 mg/0.3 mL IJ SOAJ injection Inject 0.3 mLs (0.3 mg total) into the muscle as needed for anaphylaxis. 04/16/19   Spero Geralds, MD  gabapentin (NEURONTIN) 300 MG capsule Take 1 capsule by mouth 2 (two) times daily. Patient not taking:  Reported on 02/10/2021 04/13/20 04/13/21  [provider]  nitrofurantoin, macrocrystal-monohydrate, (MACROBID) 100 MG capsule Take 100 mg by mouth 2 (two) times daily. Patient not taking: Reported on 02/10/2021 06/19/20   [provider]  phentermine (ADIPEX-P) 37.5 MG tablet Take 37.5 mg by mouth every  morning. Patient not taking: Reported on 02/10/2021 07/02/20   [provider]  pantoprazole (PROTONIX) 40 MG tablet Take 1 tablet (40 mg total) by mouth daily. 04/16/19 12/16/19  Spero Geralds, MD    Allergies as of 01/28/2021 - Review Complete 01/27/2021  Allergen Reaction Noted   Cephalexin Hives and Rash 03/12/2014   Fexofenadine Shortness Of Breath    Iodinated contrast media Hives 05/23/2016   Ketorolac Hives 03/12/2014   Morphine Anaphylaxis 07/01/2013   Phenazopyridine Rash    Promethazine Hives 03/12/2014   Cetirizine Other (See Comments) 03/05/2018   Latex  06/18/2014   Morphine and related  06/18/2014   Other Hives 02/23/2013   Pyridium [phenazopyridine hcl]  06/18/2014   Tramadol Hives 06/18/2014   Vioxx [rofecoxib]  06/18/2014   Hepatitis b virus vaccines Rash 07/01/2013   Naproxen Rash 07/01/2013   Oxycodone-acetaminophen Itching 03/12/2014   Sulfa antibiotics Rash 06/18/2014   Sulfasalazine Rash 06/18/2014    Family History  Problem Relation Age of Onset   Breast cancer Mother 12   Breast cancer Cousin 34   Breast cancer Cousin 6   Asthma Father    Asthma Sister    Asthma Child     Social History   Socioeconomic History   Marital status: Single    Spouse name: Not on file   Number of children: Not on file   Years of education: Not on file   Highest education level: Not on file  Occupational History   Not on file  Tobacco Use   Smoking status: Never   Smokeless tobacco: Never  Vaping Use   Vaping Use: Never used  Substance and Sexual Activity   Alcohol use: No   Drug use: No   Sexual activity: Not on file  Other Topics Concern   Not on file  Social History Narrative   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    Social Determinants of Health   Financial Resource Strain: Not on file  Food Insecurity: Not on file  Transportation Needs: Not on file   Physical Activity: Not on file  Stress: Not on file  Social Connections: Not on file  Intimate Partner Violence: Not on file    Review of Systems: See HPI, otherwise negative ROS  Physical Exam: BP 125/83    Pulse 68    Temp (!) 96.3 F (35.7 C) (Temporal)    Resp 17    Ht 5\' 1"  (1.549 m)    Wt 90.9 kg    LMP  (LMP Unknown) Comment: pregnancy test negative   SpO2 100%    BMI 37.86 kg/m  General:   Alert,  pleasant and cooperative in NAD Head:  Normocephalic and atraumatic. Neck:  Supple; no masses or thyromegaly. Lungs:  Clear throughout to auscultation, normal respiratory effort.    Heart:  +S1, +S2, Regular rate and rhythm, No edema. Abdomen:  Soft, nontender and nondistended. Normal bowel sounds, without guarding, and without rebound.   Neurologic:  Alert and  oriented x4;  grossly normal neurologically.  Impression/Plan: Debbie Sosa is here for an endoscopy  to be performed for  evaluation of  odynophagia    Risks, benefits, limitations, and alternatives regarding endoscopy have been reviewed with the patient.  Questions have been answered.  All parties agreeable.   [name], MD  02/10/2021, 3:21 PM

## 2021-02-10 NOTE — Transfer of Care (Signed)
Immediate Anesthesia Transfer of Care Note  Patient: Debbie Sosa  Procedure(s) Performed: ESOPHAGOGASTRODUODENOSCOPY (EGD) WITH PROPOFOL  Patient Location: PACU  Anesthesia Type:General  Level of Consciousness: alert  and drowsy  Airway & Oxygen Therapy: Patient Spontanous Breathing  Post-op Assessment: Report given to RN and Post -op Vital signs reviewed and stable  Post vital signs: Reviewed and stable  Last Vitals:  Vitals Value Taken Time  BP 130/85 02/10/21 1158  Temp    Pulse 80 02/10/21 1158  Resp 21 02/10/21 1158  SpO2 100 % 02/10/21 1158    Last Pain:  Vitals:   02/10/21 1157  TempSrc:   PainSc: 0-No pain         Complications: No notable events documented.

## 2021-02-10 NOTE — Progress Notes (Signed)
Inform the patient that the brushings did not confirm Candida but endoscopically it looked very suspicious for Candida.  Recommend Diflucan 200 mg daily for 10 days taken once daily.  We will see her back in office after she completes this.

## 2021-02-10 NOTE — Anesthesia Postprocedure Evaluation (Signed)
Anesthesia Post Note  Patient: Debbie Sosa  Procedure(s) Performed: ESOPHAGOGASTRODUODENOSCOPY (EGD) WITH PROPOFOL  Patient location during evaluation: Endoscopy Anesthesia Type: General Level of consciousness: awake and alert Pain management: pain level controlled Vital Signs Assessment: post-procedure vital signs reviewed and stable Respiratory status: spontaneous breathing, nonlabored ventilation, respiratory function stable and patient connected to nasal cannula oxygen Cardiovascular status: blood pressure returned to baseline and stable Postop Assessment: no apparent nausea or vomiting Anesthetic complications: no   No notable events documented.   Last Vitals:  Vitals:   02/10/21 1207 02/10/21 1209  BP: 125/83 125/83  Pulse: 74 68  Resp: 20 17  Temp:    SpO2: 100% 100%    Last Pain:  Vitals:   02/10/21 1209  TempSrc:   PainSc: 0-No pain                 Precious Haws Maylie Ashton

## 2021-02-10 NOTE — Op Note (Signed)
[facility] Gastroenterology Patient Name: Debbie Sosa Procedure Date: 02/10/2021 11:38 AM MRN: [id number] Account #: [id number] Date of Birth: 27-Oct-1975 Admit Type: Outpatient Age: 46 Room: [facility] ENDO ROOM 3 Gender: Female Note Status: Finalized Instrument Name: Michaelle Birks [id number] Procedure:             Upper GI endoscopy Indications:           Odynophagia Providers:             [name] MD, MD Referring MD:          Leonie Douglas. Doy Hutching, MD (Referring MD) Medicines:             Monitored Anesthesia Care Complications:         No immediate complications. Procedure:             Pre-Anesthesia Assessment:                        - Prior to the procedure, a History and Physical was                         performed, and patient medications, allergies and                         sensitivities were reviewed. The patient's tolerance                         of previous anesthesia was reviewed.                        - The risks and benefits of the procedure and the                         sedation options and risks were discussed with the                         patient. All questions were answered and informed                         consent was obtained.                        - Prior to the procedure, a History and Physical was                         performed, and patient medications, allergies and                         sensitivities were reviewed. The patient's tolerance                         of previous anesthesia was reviewed.                        - ASA Grade Assessment: II - A patient with mild                         systemic disease.                        After obtaining informed consent,  the endoscope was                         passed under direct vision. Throughout the procedure,                         the patient's blood pressure, pulse, and oxygen                         saturations were monitored continuously. The Endoscope                          was introduced through the mouth, and advanced to the                         jejunum. The upper GI endoscopy was accomplished                         without difficulty. The patient tolerated the                         procedure well. Findings:      Patchy, white plaques were found in the entire esophagus. Brushings for       KOH prep were obtained in the entire esophagus.      Evidence of a Roux-en-Y gastrojejunostomy was found. The gastrojejunal       anastomosis was characterized by healthy appearing mucosa. This was       traversed. The pouch-to-jejunum limb was characterized by healthy       appearing mucosa.      The cardia and gastric fundus were normal on retroflexion.      The examined jejunum was normal. Impression:            - Esophageal plaques were found, suspicious for                         candidiasis. Brushings performed.                        - Roux-en-Y gastrojejunostomy with gastrojejunal                         anastomosis characterized by healthy appearing mucosa.                        - Normal examined jejunum. Recommendation:        - Discharge patient to home (with escort).                        - Resume previous diet.                        - Continue present medications.                        - Await pathology results.                        - Return to my office PRN. Procedure Code(s):     --- Professional ---  93734, Esophagogastroduodenoscopy, flexible,                         transoral; diagnostic, including collection of                         specimen(s) by brushing or washing, when performed                         (separate procedure) Diagnosis Code(s):     --- Professional ---                        K22.9, Disease of esophagus, unspecified                        Z98.0, Intestinal bypass and anastomosis status                        R13.10, Dysphagia, unspecified CPT copyright 2019 American Medical  Association. All rights reserved. The codes documented in this report are preliminary and upon coder review may  be revised to meet current compliance requirements. [name], MD [name] MD, MD 02/10/2021 11:53:15 AM This report has been signed electronically. Number of Addenda: 0 Note Initiated On: 02/10/2021 11:38 AM Estimated Blood Loss:  Estimated blood loss: none.      [facility]

## 2021-02-10 NOTE — Anesthesia Preprocedure Evaluation (Signed)
Anesthesia Evaluation  Patient identified by MRN, date of birth, ID band Patient awake    Reviewed: Allergy & Precautions, H&P , NPO status , Patient's Chart, lab work & pertinent test results, reviewed documented beta blocker date and time   History of Anesthesia Complications Negative for: history of anesthetic complications  Airway Mallampati: II  TM Distance: >3 FB Neck ROM: full    Dental  (+) Poor Dentition, Chipped   Pulmonary shortness of breath and with exertion, asthma , pneumonia, resolved,    Pulmonary exam normal        Cardiovascular Exercise Tolerance: Good (-) angina(-) Past MI negative cardio ROS Normal cardiovascular exam     Neuro/Psych negative neurological ROS  negative psych ROS   GI/Hepatic negative GI ROS, Neg liver ROS, GERD  Controlled,  Endo/Other  negative endocrine ROS  Renal/GU negative Renal ROS  negative genitourinary   Musculoskeletal  (+) Arthritis ,   Abdominal   Peds  Hematology negative hematology ROS (+)   Anesthesia Other Findings Past Medical History: No date: Asthma No date: Pulmonary embolism [facility]) Past Surgical History: 2006: BREAST BIOPSY; Left     Comment:  benign/clip 2015: BREAST BIOPSY; Right     Comment:  benign/clip No date: BREAST REDUCTION SURGERY No date: CESAREAN SECTION No date: CESAREAN SECTION No date: CHOLECYSTECTOMY No date: COLONOSCOPY 10/31/2018: COLONOSCOPY WITH PROPOFOL; N/A     Comment:  Procedure: COLONOSCOPY WITH PROPOFOL;  Surgeon: [name], MD;  Location: [facility] ENDOSCOPY;  Service:               Gastroenterology;  Laterality: N/A; 10/04/2016: ESOPHAGOGASTRODUODENOSCOPY (EGD) WITH PROPOFOL; N/A     Comment:  Procedure: ESOPHAGOGASTRODUODENOSCOPY (EGD) WITH               PROPOFOL;  Surgeon: [name], MD;  Location: [facility]               ENDOSCOPY;  Service: Gastroenterology;  Laterality: N/A; 04/14/2017:  ESOPHAGOGASTRODUODENOSCOPY (EGD) WITH PROPOFOL; N/A     Comment:  Procedure: ESOPHAGOGASTRODUODENOSCOPY (EGD) WITH               PROPOFOL;  Surgeon: [name], MD;  Location: [facility]               ENDOSCOPY;  Service: Gastroenterology;  Laterality: N/A; 10/31/2018: ESOPHAGOGASTRODUODENOSCOPY (EGD) WITH PROPOFOL; N/A     Comment:  Procedure: ESOPHAGOGASTRODUODENOSCOPY (EGD) WITH               PROPOFOL;  Surgeon: [name], MD;  Location: [facility]               ENDOSCOPY;  Service: Gastroenterology;  Laterality: N/A; No date: GASTRIC BYPASS     Comment:  01/2015 No date: KNEE SURGERY No date: NASAL SINUS SURGERY No date: PLANTAR FASCIA SURGERY     Comment:  x 3 2005: REDUCTION MAMMAPLASTY; Bilateral   Reproductive/Obstetrics negative OB ROS                             Anesthesia Physical  Anesthesia Plan  ASA: III  Anesthesia Plan: General   Post-op Pain Management:    Induction: Intravenous  PONV Risk Score and Plan:   Airway Management Planned: Natural Airway and Nasal Cannula  Additional Equipment:   Intra-op Plan:   Post-operative Plan:   Informed Consent: I have reviewed the patients History  and Physical, chart, labs and discussed the procedure including the risks, benefits and alternatives for the proposed anesthesia with the patient or authorized representative who has indicated his/her understanding and acceptance.     Dental Advisory Given  Plan Discussed with: CRNA  Anesthesia Plan Comments: (Patient consented for risks of anesthesia including but not limited to:  - adverse reactions to medications - risk of airway placement if required - damage to eyes, teeth, lips or other oral mucosa - nerve damage due to positioning  - sore throat or hoarseness - Damage to heart, brain, nerves, lungs, other parts of body or loss of life  Patient voiced understanding.)        Anesthesia Quick Evaluation

## 2021-02-11 ENCOUNTER — Telehealth: Payer: Self-pay

## 2021-02-11 ENCOUNTER — Other Ambulatory Visit: Payer: Self-pay | Admitting: Internal Medicine

## 2021-02-11 ENCOUNTER — Encounter: Payer: Self-pay | Admitting: Gastroenterology

## 2021-02-11 DIAGNOSIS — M5430 Sciatica, unspecified side: Secondary | ICD-10-CM

## 2021-02-11 MED ORDER — FLUCONAZOLE 200 MG PO TABS: 200.0000 mg | ORAL_TABLET | Freq: Every day | ORAL | 0 refills | Status: DC

## 2021-02-11 NOTE — Telephone Encounter (Signed)
Called patient to let her know the below information. Patient was told that her prescription will be sent to her pharmacy and to start her antibiotic as soon as she can. Patient understood and had no further questions.

## 2021-02-11 NOTE — Telephone Encounter (Signed)
-----   Message from [name], MD sent at 02/10/2021  1:51 PM EST ----- Inform the patient that the brushings did not confirm Candida but endoscopically it looked very suspicious for Candida.  Recommend Diflucan 200 mg daily for 10 days taken once daily.  We will see her back in office after she completes this.

## 2021-02-14 ENCOUNTER — Ambulatory Visit
Admission: EM | Admit: 2021-02-14 | Discharge: 2021-02-14 | Disposition: A | Discharge status: Home / Self Care | Payer: BC Managed Care – PPO | Attending: Emergency Medicine | Admitting: Emergency Medicine

## 2021-02-14 ENCOUNTER — Other Ambulatory Visit: Payer: Self-pay

## 2021-02-14 DIAGNOSIS — R21 Rash and other nonspecific skin eruption: Secondary | ICD-10-CM | POA: Diagnosis not present

## 2021-02-14 MED ORDER — CLOBETASOL PROPIONATE 0.05 % EX CREA: 1.0000 "application " | TOPICAL_CREAM | Freq: Two times a day (BID) | CUTANEOUS | 0 refills | Status: DC

## 2021-02-14 NOTE — ED Triage Notes (Signed)
Patient is here for "rash". Note: Endoscopy on Wednesday. Rx given and started "now rash/bumps under right arm". Itchy, raised, circular. No fever.

## 2021-02-14 NOTE — Discharge Instructions (Signed)
Stop the Flagyl.  Apply the Temovate twice daily to the lesions in your axilla.  Contact your GI doctor to discuss a different antibiotic for the treatment of your esophageal infection.  Schedule a follow-up appointment with [facility] dermatology if your symptoms do not improve.

## 2021-02-14 NOTE — ED Provider Notes (Signed)
[facility]    CSN: [id number] Arrival date & time: 02/14/21  2725      History   Chief Complaint Chief Complaint  Patient presents with   Rash    HPI Debbie Sosa is a 46 y.o. female.   HPI  46 year old female here for evaluation of skin complaints.  Patient ports that she recently had an endoscopy which determined that she had an esophageal infection and she was placed on Flagyl by her GI doctor.  She started the Flagyl Friday night and woke up Saturday with painful, circular, itchy lesions in both axilla.  She has had this presentation previously and was diagnosed as having a fixed drug eruption by [facility] dermatology.  This was in response to Zyrtec exposure.  She was told by dermatology that if she develops allergies that they may present with repeat eruptions so she came in because she is concerned she is reacting to the Flagyl.  She denies any swelling of her lips or tongue, tightness in her throat, or shortness of breath.  She does have an EpiPen but did not give herself a dose because she was not experiencing any respiratory complaints.  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   Rheumatoid arthritis involving multiple sites with positive rheumatoid factor ([place]) 09/19/2019   Shortness of breath 03/25/2019   History of COVID-19 03/25/2019   Chronic cough 03/25/2019   Hypoxia 01/13/2019   Pneumonia due to COVID-19 virus 01/13/2019   Personal history of pulmonary embolism 01/13/2019   Bilateral leg pain 01/13/2019   Allergic rhinitis 12/04/2017   Acute laryngitis 08/17/2016   Dysphagia 08/17/2016   Meningitis 08/17/2016   Singers' nodes 08/17/2016   Diarrhea, unspecified 10/14/2015   Left lower quadrant pain  10/14/2015   Nausea and vomiting 10/14/2015   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   S/P gastric bypass 07/23/2015   Achalasia 01/07/2015   Central sleep apnea 01/07/2015   Obesity (BMI 30-39.9) 01/07/2015   GERD (gastroesophageal reflux disease) 12/25/2014   Pulmonary embolism ([place])    Plantar fasciitis of right foot 12/31/2013    Past Surgical History:  Procedure Laterality Date   BREAST BIOPSY Left 2006   benign/clip   BREAST BIOPSY Right 2015   benign/clip   BREAST REDUCTION SURGERY     CESAREAN SECTION     CESAREAN SECTION     CHOLECYSTECTOMY     COLONOSCOPY     COLONOSCOPY WITH PROPOFOL N/A 10/31/2018   Procedure: COLONOSCOPY WITH PROPOFOL;  Surgeon: [name], MD;  Location: [facility] ENDOSCOPY;  Service: Gastroenterology;  Laterality: N/A;   ESOPHAGOGASTRODUODENOSCOPY (EGD) WITH PROPOFOL N/A 10/04/2016   Procedure: ESOPHAGOGASTRODUODENOSCOPY (EGD) WITH PROPOFOL;  Surgeon: [name], MD;  Location: [facility] ENDOSCOPY;  Service: Gastroenterology;  Laterality: N/A;   ESOPHAGOGASTRODUODENOSCOPY (EGD) WITH PROPOFOL N/A 04/14/2017   Procedure: ESOPHAGOGASTRODUODENOSCOPY (EGD) WITH PROPOFOL;  Surgeon: [name], MD;  Location: [facility] ENDOSCOPY;  Service: Gastroenterology;  Laterality: N/A;   ESOPHAGOGASTRODUODENOSCOPY (EGD) WITH PROPOFOL N/A 10/31/2018   Procedure: ESOPHAGOGASTRODUODENOSCOPY (EGD) WITH PROPOFOL;  Surgeon: [name], MD;  Location: [facility] ENDOSCOPY;  Service: Gastroenterology;  Laterality: N/A;   ESOPHAGOGASTRODUODENOSCOPY (EGD) WITH PROPOFOL N/A 06/28/2019   Procedure: ESOPHAGOGASTRODUODENOSCOPY (EGD) WITH PROPOFOL;  Surgeon: [name], MD;  Location: [facility] ENDOSCOPY;  Service:  Gastroenterology;  Laterality: N/A;   ESOPHAGOGASTRODUODENOSCOPY (EGD) WITH PROPOFOL N/A 02/10/2021   Procedure: ESOPHAGOGASTRODUODENOSCOPY (EGD) WITH PROPOFOL;  Surgeon: [name], MD;  Location: [facility] ENDOSCOPY;  Service: Gastroenterology;  Laterality: N/A;   GASTRIC BYPASS     01/2015   KNEE  SURGERY     NASAL SINUS SURGERY     PLANTAR FASCIA SURGERY     x 3   REDUCTION MAMMAPLASTY Bilateral 2005    OB History   No obstetric history on file.      Home Medications    Prior to Admission medications   Medication Sig Start Date End Date Taking? Authorizing Provider  clobetasol cream (TEMOVATE) 4.08 % Apply 1 application topically 2 (two) times daily. 02/14/21  Yes Margarette Canada, NP  cyanocobalamin (,VITAMIN B-12,) 1000 MCG/ML injection Inject into the muscle. 01/13/20  Yes [provider]  cyclobenzaprine (FLEXERIL) 5 MG tablet  05/29/20  Yes [provider]  montelukast (SINGULAIR) 10 MG tablet Take 10 mg by mouth daily.  08/20/15  Yes [provider]  albuterol (VENTOLIN HFA) 108 (90 Base) MCG/ACT inhaler Inhale 1-2 puffs INH Q4-6hr prn for chest tightness, cough, wheezing, SOB/DOE. 01/10/19   Jaynee Eagles, PA-C  budesonide-formoterol [facility]) 160-4.5 MCG/ACT inhaler Inhale 2 puffs into the lungs in the morning and at bedtime. 09/30/19   Spero Geralds, MD  EPINEPHrine (ADRENALIN) 1 MG/ML SOLN injection See admin instructions. 01/07/21   [provider]  EPINEPHrine (EPIPEN 2-PAK) 0.3 mg/0.3 mL IJ SOAJ injection Inject 0.3 mLs (0.3 mg total) into the muscle as needed for anaphylaxis. 04/16/19   Spero Geralds, MD  gabapentin (NEURONTIN) 300 MG capsule Take 1 capsule by mouth 2 (two) times daily. 04/13/20 04/13/21  [provider]  hydroxychloroquine (PLAQUENIL) 200 MG tablet Take by mouth. 01/06/20   [provider]  nitrofurantoin, macrocrystal-monohydrate, (MACROBID) 100 MG capsule Take 100 mg by mouth 2 (two) times daily. 06/19/20   [provider]  omeprazole (PRILOSEC) 40 MG capsule Take 1 capsule (40 mg total) by mouth in the morning and at bedtime. 01/27/21   [name], MD  phentermine (ADIPEX-P) 37.5 MG tablet Take 37.5 mg by mouth every morning. 07/02/20   [provider]  tiZANidine (ZANAFLEX) 4 MG  capsule Take 4 mg by mouth 2 (two) times daily as needed. 05/14/20   [provider]  Vitamin D, Ergocalciferol, (DRISDOL) 1.25 MG (50000 UT) CAPS capsule Take 50,000 Units by mouth once a week. 11/28/18   [provider]  pantoprazole (PROTONIX) 40 MG tablet Take 1 tablet (40 mg total) by mouth daily. 04/16/19 12/16/19  Spero Geralds, MD    Family History Family History  Problem Relation Age of Onset   Breast cancer Mother 17   Breast cancer Cousin 45   Breast cancer Cousin 58   Asthma Father    Asthma Sister    Asthma Child     Social History Social History   Tobacco Use   Smoking status: Never   Smokeless tobacco: Never  Vaping Use   Vaping Use: Never used  Substance Use Topics   Alcohol use: No   Drug use: No     Allergies   Cephalexin, Fexofenadine, Iodinated contrast media, Ketorolac, Morphine, Phenazopyridine, Promethazine, Cetirizine, Latex, Morphine and related, Other, Pyridium [phenazopyridine hcl], Tramadol, Vioxx [rofecoxib], Hepatitis b virus vaccines, Naproxen, Oxycodone-acetaminophen, Sulfa antibiotics, and Sulfasalazine   Review of Systems Review of Systems  HENT:  Negative for trouble swallowing.   Respiratory:  Negative  for chest tightness, shortness of breath and wheezing.   Cardiovascular:  Negative for chest pain.  Skin:  Positive for rash.    Physical Exam Triage Vital Signs ED Triage Vitals [02/14/21 0929]  Enc Vitals Group     BP 122/84     Pulse Rate 91     Resp 20     Temp 98 F (36.7 C)     Temp Source Oral     SpO2 99 %     Weight 200 lb 6.4 oz (90.9 kg)     Height 5\' 1"  (1.549 m)     Head Circumference      Peak Flow      Pain Score 0     Pain Loc      Pain Edu?      Excl. in [place]?    No data found.  Updated Vital Signs BP 122/84 (BP Location: Left Arm)    Pulse 91    Temp 98 F (36.7 C) (Oral)    Resp 20    Ht 5\' 1"  (1.549 m)    Wt 200 lb 6.4 oz (90.9 kg)    LMP  (LMP Unknown) Comment: pregnancy test  negative   SpO2 99%    BMI 37.86 kg/m   Visual Acuity Right Eye Distance:   Left Eye Distance:   Bilateral Distance:    Right Eye Near:   Left Eye Near:    Bilateral Near:     Physical Exam Vitals and nursing note reviewed.  Constitutional:      General: She is not in acute distress.    Appearance: Normal appearance. She is not ill-appearing.  HENT:     Head: Normocephalic and atraumatic.     Mouth/Throat:     Mouth: Mucous membranes are moist.     Pharynx: Oropharynx is clear. No oropharyngeal exudate or posterior oropharyngeal erythema.  Cardiovascular:     Rate and Rhythm: Normal rate and regular rhythm.     Pulses: Normal pulses.     Heart sounds: Normal heart sounds. No murmur heard.   No friction rub. No gallop.  Pulmonary:     Effort: Pulmonary effort is normal.     Breath sounds: Normal breath sounds. No stridor. No wheezing, rhonchi or rales.  Musculoskeletal:     Cervical back: Normal range of motion and neck supple.  Lymphadenopathy:     Cervical: No cervical adenopathy.  Skin:    General: Skin is warm and dry.     Capillary Refill: Capillary refill takes less than 2 seconds.     Findings: No erythema or rash.  Neurological:     General: No focal deficit present.     Mental Status: She is alert and oriented to person, place, and time.  Psychiatric:        Mood and Affect: Mood normal.        Behavior: Behavior normal.        Thought Content: Thought content normal.        Judgment: Judgment normal.     UC Treatments / Results  Labs (all labs ordered are listed, but only abnormal results are displayed) Labs Reviewed - No data to display  EKG   Radiology No results found.  Procedures Procedures (including critical care time)  Medications Ordered in UC Medications - No data to display  Initial Impression / Assessment and Plan / UC Course  I have reviewed the triage vital signs and the nursing notes.  Pertinent  labs & imaging results that  were available during my care of the patient were reviewed by me and considered in my medical decision making (see chart for details).  Patient is a very pleasant, nontoxic-appearing 46 year old female here for evaluation of a rash in bilateral axilla that started after taking Flagyl.  She reports that she started taking Flagyl Friday evening for an for GL infection that was found on her recent endoscopy.  She is unsure if she is taken this medication before but she woke up Saturday morning with circular, itchy, raised, painful lesions and both axilla.  She denies any swelling of lips or tongue, throat tightening, difficulty breathing, or shortness of breath.  On exam patient has a benign oropharyngeal exam without erythema, or edema.  Her Mallampati score is 2.  She has no cervical lymphadenopathy appreciated exam.  No stridor when auscultating over the trachea.  Cardiopulmonary exam reveals clear lung sounds in all fields.  Skin exam reveals hyperpigmented lesions in both axilla the one on the right is circular and the one on the left is more T-shaped.  There are 2 satellite lesions on the right.  1 lesion is high in the axilla near the glenohumeral joint and the other one is on the proximal inner upper arm.  The larger lesions on both sides have central clearing.  On the left there are also 2 satellite lesions which are hyperpigmented with an erythematous ring on the lateral aspect of the left breast.  Patient was evaluated by [facility] dermatology on 01/01/2018 for a similar presentation and had punch biopsies performed at that time.  It was determined that she was experiencing fixed drug eruption as a result of Zyrtec exposure.  She was treated with clobetasol 0.05% cream twice daily at that time.  The description of the lesions in both axilla are exactly the same as the presentation here except for they were bola at that time.  There are no bulla, vesicles, or pustules noted at present.  We will treat patient with  clobetasol 0.05% cream twice daily until the lesions are flat and the pain and itching has resolved.  I have suggested to the patient that she stop the Flagyl and contact her GI provider to see what other antibiotic he wants to use for her esophageal infection.  I also recommended that if her symptoms do not improve she make a follow-up appoint with [facility] dermatology for reevaluation.  Return and ER precautions reviewed.   Final Clinical Impressions(s) / UC Diagnoses   Final diagnoses:  Rash and nonspecific skin eruption     Discharge Instructions      Stop the Flagyl.  Apply the Temovate twice daily to the lesions in your axilla.  Contact your GI doctor to discuss a different antibiotic for the treatment of your esophageal infection.  Schedule a follow-up appointment with [facility] dermatology if your symptoms do not improve.     ED Prescriptions     Medication Sig Dispense Auth. Provider   clobetasol cream (TEMOVATE) 1.27 % Apply 1 application topically 2 (two) times daily. 30 g Margarette Canada, NP      PDMP not reviewed this encounter.   Margarette Canada, NP 02/14/21 1100

## 2021-02-16 ENCOUNTER — Telehealth: Payer: Self-pay

## 2021-02-16 NOTE — Telephone Encounter (Signed)
ER note says flagyl- we didn't prescribe flagyl . WE prescribed diflucan can we confirm

## 2021-02-16 NOTE — Telephone Encounter (Signed)
Dr. Vicente Males patient left a message stating that the antibiotic that was given to her gave her an adverse reaction. Therefore, she went to the ED to be seen. Patient would like to know what other antibiotic she could take for her candida.

## 2021-02-17 MED ORDER — SUCRALFATE 1 G PO TABS: 1.0000 g | ORAL_TABLET | Freq: Four times a day (QID) | ORAL | 0 refills | Status: DC

## 2021-02-17 NOTE — Telephone Encounter (Signed)
Patient stated that her bottle says Diflucan 200 MG. Patient had stopped taking the medication as soon as she started to have the reaction and went to the ED. This medication was prescribed by you when you read her pathology report and stated that she could possibly have candida. Patient was never given Flagyl from anybody.

## 2021-02-17 NOTE — Telephone Encounter (Signed)
Can we confirm she had reaction after diflucan or after flagyl- if its diflucan then she should stop the Rx and if its after flagyl she should contact physician who presribed it to her

## 2021-02-17 NOTE — Telephone Encounter (Signed)
Patient stated that it was Diflucan that you had prescribed her. Patient stated that she noticed it on the ED notes but she did not know who to reach out for corrections.

## 2021-02-17 NOTE — Telephone Encounter (Signed)
Patient was contacted and informed to continue taking her PPI and that Dr. Vicente Males wants her to start taking Carafate QID and if she uses an inhaler to rinse her mouth right after. Patient understood and had no further questions.

## 2021-02-23 ENCOUNTER — Ambulatory Visit: Payer: BC Managed Care – PPO | Admitting: Gastroenterology

## 2021-02-23 ENCOUNTER — Encounter: Payer: Self-pay | Admitting: Gastroenterology

## 2021-02-23 ENCOUNTER — Other Ambulatory Visit: Payer: Self-pay

## 2021-02-23 VITALS — BP 115/77 | HR 114 | Temp 98.1°F | Wt 201.0 lb

## 2021-02-23 DIAGNOSIS — T50905D Adverse effect of unspecified drugs, medicaments and biological substances, subsequent encounter: Secondary | ICD-10-CM

## 2021-02-23 DIAGNOSIS — R1310 Dysphagia, unspecified: Secondary | ICD-10-CM

## 2021-02-23 NOTE — Progress Notes (Signed)
[name] MD, MRCP(U.K) [address]  [place], [place] [address]  Main: [phone]  Fax: [phone]   Primary Care Physician: Idelle Crouch, MD  Primary Gastroenterologist:  Dr. [name]   Chief Complaint  Patient presents with   Odynophagia    HPI: Debbie Sosa is a 46 y.o. female  Summary of history :    She has a history of diverticulitis in 2018.  History of gastric bypass.  History of abdominal pain likely secondary to IBS constipation and she did not have abdominal pain after cleaning out out process of a colonoscopy     03/2017: EGD- normal gastric by pass anatomy .  Biopsies of stomach showed atrophic gastritis and a repeat EGD in 3 years 09/05/2016: H. pylori stool antigen negative 10/31/2018: EGD: Normal-appearing Roux-en-Y gastric bypass.  Gastric biopsies showed intestinal metaplasia.  Colonoscopy: 3 hyperplastic polyps were resected.  12/26/2018: CT scan of the abdomen and pelvis with contrast no obstruction 01/15/2019: Abdominal x-ray for abdominal pain showed large volume of stool in ascending and transverse colon  11/23/2018: H. pylori stool antigen negative  06/28/2019: EGD: Patient is of gastric bypass noted.  Biopsies of stomach showed atrophic gastritis and intestinal metaplasia.  Repeat EGD in 2024 June 11/09/2020 hemoglobin 12.5 g.    Interval history  01/27/2021-02/23/2021   02/10/2021: EGD: Features of candida were seen in esophagus but non on stain. Commenced on Diflucan but had an allergic reaction and had to go to ER  She took 1 dose of Diflucan and she ended up having a skin rash over her armpits and went to the emergency room which is resolving after cessation of the medication.  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  No other complaints presently. Current Outpatient Medications  Medication Sig Dispense Refill   albuterol (VENTOLIN HFA) 108 (90 Base) MCG/ACT inhaler Inhale 1-2 puffs INH Q4-6hr prn for chest  tightness, cough, wheezing, SOB/DOE. 18 g 0   budesonide-formoterol (SYMBICORT) 160-4.5 MCG/ACT inhaler Inhale 2 puffs into the lungs in the morning and at bedtime. 1 each 12   clobetasol cream (TEMOVATE) 6.81 % Apply 1 application topically 2 (two) times daily. 30 g 0   cyanocobalamin (,VITAMIN B-12,) 1000 MCG/ML injection Inject into the muscle.     cyclobenzaprine (FLEXERIL) 5 MG tablet      DULoxetine (CYMBALTA) 20 MG capsule Take 20 mg by mouth daily.     EPINEPHrine (ADRENALIN) 1 MG/ML SOLN injection See admin instructions.     EPINEPHrine (EPIPEN 2-PAK) 0.3 mg/0.3 mL IJ SOAJ injection Inject 0.3 mLs (0.3 mg total) into the muscle as needed for anaphylaxis. 1 each 1   gabapentin (NEURONTIN) 300 MG capsule Take 1 capsule by mouth 2 (two) times daily.     hydroxychloroquine (PLAQUENIL) 200 MG tablet Take by mouth.     montelukast (SINGULAIR) 10 MG tablet Take 10 mg by mouth daily.      nitrofurantoin, macrocrystal-monohydrate, (MACROBID) 100 MG capsule Take 100 mg by mouth 2 (two) times daily.     omeprazole (PRILOSEC) 40 MG capsule Take 1 capsule (40 mg total) by mouth in the morning and at bedtime. 180 capsule 3   phentermine (ADIPEX-P) 37.5 MG tablet Take 37.5 mg by mouth every morning.     sucralfate (CARAFATE) 1 g tablet Take 1 tablet (1 g total) by mouth 4 (four) times daily for 14 days. 56 tablet 0   tiZANidine (ZANAFLEX) 4 MG capsule Take 4 mg by  mouth 2 (two) times daily as needed.     Vitamin D, Ergocalciferol, (DRISDOL) 1.25 MG (50000 UT) CAPS capsule Take 50,000 Units by mouth once a week.     No current facility-administered medications for this visit.    Allergies as of 02/23/2021 - Review Complete 02/23/2021  Allergen Reaction Noted   Cephalexin Hives and Rash 03/12/2014   Fexofenadine Shortness Of Breath    Iodinated contrast media Hives 05/23/2016   Ketorolac Hives 03/12/2014   Morphine Anaphylaxis 07/01/2013   Phenazopyridine Rash    Promethazine Hives 03/12/2014    Diflucan [fluconazole] Rash 02/17/2021   Cetirizine Other (See Comments) 03/05/2018   Latex  06/18/2014   Morphine and related  06/18/2014   Other Hives 02/23/2013   Pyridium [phenazopyridine hcl]  06/18/2014   Tramadol Hives 06/18/2014   Vioxx [rofecoxib]  06/18/2014   Hepatitis b virus vaccines Rash 07/01/2013   Naproxen Rash 07/01/2013   Oxycodone-acetaminophen Itching 03/12/2014   Sulfa antibiotics Rash 06/18/2014   Sulfasalazine Rash 06/18/2014    ROS:  General: Negative for anorexia, weight loss, fever, chills, fatigue, weakness. ENT: Negative for hoarseness, difficulty swallowing , nasal congestion. CV: Negative for chest pain, angina, palpitations, dyspnea on exertion, peripheral edema.  Respiratory: Negative for dyspnea at rest, dyspnea on exertion, cough, sputum, wheezing.  GI: See history of present illness. GU:  Negative for dysuria, hematuria, urinary incontinence, urinary frequency, nocturnal urination.  Endo: Negative for unusual weight change.    Physical Examination:   BP 115/77    Pulse (!) 114    Temp 98.1 F (36.7 C) (Oral)    Wt 201 lb (91.2 kg)    LMP  (LMP Unknown) Comment: pregnancy test negative   BMI 37.98 kg/m   General: Well-nourished, well-developed in no acute distress.  Eyes: No icterus. Conjunctivae pink. Neuro: Alert and oriented x 3.  Grossly intact. Skin: Warm and dry, no jaundice.   Psych: Alert and cooperative, normal mood and affect.   Imaging Studies: No results found.  Assessment and Plan:   Debbie Sosa is a 46 y.o. y/o female  here to see me for issues of her esophagus.  History of IBS constipation and a mass prior history of Roux-en-Y gastric bypass, gastric intestinal metaplasia.  she does not have a gallbladder.  EGD demonstrated gastric intestinal metaplasia in October 2020 and colonoscopy showed no gross abnormalities except hyperplastic polyps.  Here to follow up for odynophagia. EGD showed features of candida esophagitis  but KOH stain was negative. Treated her empirically with diflucan and had an allergic reaction taking her to the ER.  Since then the odynophagia has resolved.  Very likely she developed Candida of the esophagus secondary to inhaler use which contains budesonide and her Symbicort.  I discussed with her about rinsing her mouth every time she uses the inhaler.  Would avoid any further treatment as she is asymptomatic at this point of time    Plan 1.   Continue Prilosec 40 mg BID, continue lifestyle changes for GERD   Dr [name]  MD,MRCP [facility]) Follow up in as needed

## 2021-02-24 ENCOUNTER — Ambulatory Visit: Payer: BC Managed Care – PPO | Admitting: Gastroenterology

## 2021-02-25 ENCOUNTER — Ambulatory Visit
Admission: RE | Admit: 2021-02-25 | Discharge: 2021-02-25 | Disposition: A | Discharge status: Home / Self Care | Payer: BC Managed Care – PPO | Source: Ambulatory Visit | Attending: Internal Medicine | Admitting: Internal Medicine

## 2021-02-25 ENCOUNTER — Other Ambulatory Visit: Payer: Self-pay

## 2021-02-25 DIAGNOSIS — M549 Dorsalgia, unspecified: Secondary | ICD-10-CM | POA: Insufficient documentation

## 2021-02-25 DIAGNOSIS — M5430 Sciatica, unspecified side: Secondary | ICD-10-CM | POA: Insufficient documentation

## 2021-03-04 ENCOUNTER — Encounter (INDEPENDENT_AMBULATORY_CARE_PROVIDER_SITE_OTHER): Payer: Self-pay | Admitting: Vascular Surgery

## 2021-03-04 ENCOUNTER — Other Ambulatory Visit: Payer: Self-pay

## 2021-03-04 ENCOUNTER — Ambulatory Visit (INDEPENDENT_AMBULATORY_CARE_PROVIDER_SITE_OTHER): Payer: BC Managed Care – PPO | Admitting: Vascular Surgery

## 2021-03-04 VITALS — BP 111/77 | HR 83 | Ht 61.0 in | Wt 197.0 lb

## 2021-03-04 DIAGNOSIS — M79604 Pain in right leg: Secondary | ICD-10-CM | POA: Diagnosis not present

## 2021-03-04 DIAGNOSIS — I872 Venous insufficiency (chronic) (peripheral): Secondary | ICD-10-CM

## 2021-03-04 DIAGNOSIS — K219 Gastro-esophageal reflux disease without esophagitis: Secondary | ICD-10-CM

## 2021-03-04 DIAGNOSIS — I890 Lymphedema, not elsewhere classified: Secondary | ICD-10-CM | POA: Diagnosis not present

## 2021-03-04 DIAGNOSIS — I2782 Chronic pulmonary embolism: Secondary | ICD-10-CM | POA: Diagnosis not present

## 2021-03-04 DIAGNOSIS — M79605 Pain in left leg: Secondary | ICD-10-CM

## 2021-03-07 ENCOUNTER — Encounter (INDEPENDENT_AMBULATORY_CARE_PROVIDER_SITE_OTHER): Payer: Self-pay | Admitting: Vascular Surgery

## 2021-03-07 DIAGNOSIS — I890 Lymphedema, not elsewhere classified: Secondary | ICD-10-CM | POA: Insufficient documentation

## 2021-03-07 DIAGNOSIS — I872 Venous insufficiency (chronic) (peripheral): Secondary | ICD-10-CM | POA: Insufficient documentation

## 2021-03-07 NOTE — Progress Notes (Signed)
MRN : [id number]  Debbie Sosa is a 46 y.o. (1976-01-06) female who presents with chief complaint of leg pain and swelling.  History of Present Illness: Patient is seen for evaluation of leg pain and leg swelling. The patient first noticed the swelling remotely. The swelling is associated with pain and discoloration. The pain and swelling worsens with prolonged dependency and improves with elevation. The pain is unrelated to activity.  The patient notes that in the morning the legs are significantly improved but they steadily worsened throughout the course of the day. The patient also notes a steady worsening of the discoloration in the ankle and shin area.   The patient denies claudication symptoms.  The patient denies symptoms consistent with rest pain.  The patient denies and extensive history of DJD and LS spine disease.  The patient has no had any past angiography, interventions or vascular surgery.  Elevation makes the leg symptoms better, dependency makes them much worse. There is no history of ulcerations. The patient denies any recent changes in medications.  The patient has not been wearing graduated compression.  The patient has a history of DVT and PE about 2002. There is no prior history of phlebitis. There is no history of primary lymphedema.  No history of malignancies. No history of trauma or groin or pelvic surgery. There is no history of radiation treatment to the groin or pelvis  The patient denies amaurosis fugax or recent TIA symptoms. There are no recent neurological changes noted. The patient denies recent episodes of angina or shortness of breath   Current Meds  Medication Sig   albuterol (VENTOLIN HFA) 108 (90 Base) MCG/ACT inhaler Inhale 1-2 puffs INH Q4-6hr prn for chest tightness, cough, wheezing, SOB/DOE.   budesonide-formoterol (SYMBICORT) 160-4.5 MCG/ACT inhaler Inhale 2 puffs into the lungs in the morning and at bedtime.   clobetasol cream (TEMOVATE)  4.09 % Apply 1 application topically 2 (two) times daily.   cyanocobalamin (,VITAMIN B-12,) 1000 MCG/ML injection Inject into the muscle.   cyclobenzaprine (FLEXERIL) 5 MG tablet    DULoxetine (CYMBALTA) 20 MG capsule Take 20 mg by mouth daily.   EPINEPHrine (ADRENALIN) 1 MG/ML SOLN injection See admin instructions.   EPINEPHrine (EPIPEN 2-PAK) 0.3 mg/0.3 mL IJ SOAJ injection Inject 0.3 mLs (0.3 mg total) into the muscle as needed for anaphylaxis.   gabapentin (NEURONTIN) 300 MG capsule Take 1 capsule by mouth 2 (two) times daily.   hydroxychloroquine (PLAQUENIL) 200 MG tablet Take by mouth.   montelukast (SINGULAIR) 10 MG tablet Take 10 mg by mouth daily.    nitrofurantoin, macrocrystal-monohydrate, (MACROBID) 100 MG capsule Take 100 mg by mouth 2 (two) times daily.   omeprazole (PRILOSEC) 40 MG capsule Take 1 capsule (40 mg total) by mouth in the morning and at bedtime.   phentermine (ADIPEX-P) 37.5 MG tablet Take 37.5 mg by mouth every morning.   tiZANidine (ZANAFLEX) 4 MG capsule Take 4 mg by mouth 2 (two) times daily as needed.   Vitamin D, Ergocalciferol, (DRISDOL) 1.25 MG (50000 UT) CAPS capsule Take 50,000 Units by mouth once a week.    Past Medical History:  Diagnosis Date   Asthma    Pulmonary embolism [facility])     Past Surgical History:  Procedure Laterality Date   BREAST BIOPSY Left 2006   benign/clip   BREAST BIOPSY Right 2015   benign/clip   BREAST REDUCTION SURGERY     CESAREAN SECTION     CESAREAN SECTION  CHOLECYSTECTOMY     COLONOSCOPY     COLONOSCOPY WITH PROPOFOL N/A 10/31/2018   Procedure: COLONOSCOPY WITH PROPOFOL;  Surgeon: [name], MD;  Location: [facility] ENDOSCOPY;  Service: Gastroenterology;  Laterality: N/A;   ESOPHAGOGASTRODUODENOSCOPY (EGD) WITH PROPOFOL N/A 10/04/2016   Procedure: ESOPHAGOGASTRODUODENOSCOPY (EGD) WITH PROPOFOL;  Surgeon: [name], MD;  Location: [facility] ENDOSCOPY;  Service: Gastroenterology;  Laterality: N/A;    ESOPHAGOGASTRODUODENOSCOPY (EGD) WITH PROPOFOL N/A 04/14/2017   Procedure: ESOPHAGOGASTRODUODENOSCOPY (EGD) WITH PROPOFOL;  Surgeon: [name], MD;  Location: [facility] ENDOSCOPY;  Service: Gastroenterology;  Laterality: N/A;   ESOPHAGOGASTRODUODENOSCOPY (EGD) WITH PROPOFOL N/A 10/31/2018   Procedure: ESOPHAGOGASTRODUODENOSCOPY (EGD) WITH PROPOFOL;  Surgeon: [name], MD;  Location: [facility] ENDOSCOPY;  Service: Gastroenterology;  Laterality: N/A;   ESOPHAGOGASTRODUODENOSCOPY (EGD) WITH PROPOFOL N/A 06/28/2019   Procedure: ESOPHAGOGASTRODUODENOSCOPY (EGD) WITH PROPOFOL;  Surgeon: [name], MD;  Location: [facility] ENDOSCOPY;  Service: Gastroenterology;  Laterality: N/A;   ESOPHAGOGASTRODUODENOSCOPY (EGD) WITH PROPOFOL N/A 02/10/2021   Procedure: ESOPHAGOGASTRODUODENOSCOPY (EGD) WITH PROPOFOL;  Surgeon: [name], MD;  Location: [facility] ENDOSCOPY;  Service: Gastroenterology;  Laterality: N/A;   GASTRIC BYPASS     01/2015   KNEE SURGERY     NASAL SINUS SURGERY     PLANTAR FASCIA SURGERY     x 3   REDUCTION MAMMAPLASTY Bilateral 2005    Social History Social History   Tobacco Use   Smoking status: Never   Smokeless tobacco: Never  Vaping Use   Vaping Use: Never used  Substance Use Topics   Alcohol use: No   Drug use: No    Family History Family History  Problem Relation Age of Onset   Breast cancer Mother 57   Breast cancer Cousin 2   Breast cancer Cousin 55   Asthma Father    Asthma Sister    Asthma Child     Allergies  Allergen Reactions   Cephalexin Hives and Rash   Fexofenadine Shortness Of Breath   Iodinated Contrast Media Hives   Ketorolac Hives   Morphine Anaphylaxis   Phenazopyridine Rash   Promethazine Hives   Diflucan [Fluconazole] Rash   Cetirizine Other (See Comments)    Skin eruptures   Latex    Morphine And Related    Other Hives    Pain medicine that starts with an L   Pyridium [Phenazopyridine Hcl]    Tramadol Hives   Vioxx [Rofecoxib]    Hepatitis B  Virus Vaccines Rash   Naproxen Rash   Oxycodone-Acetaminophen Itching   Sulfa Antibiotics Rash   Sulfasalazine Rash     REVIEW OF SYSTEMS (Negative unless checked)  Constitutional: [] Weight loss  [] Fever  [] Chills Cardiac: [] Chest pain   [] Chest pressure   [] Palpitations   [] Shortness of breath when laying flat   [] Shortness of breath with exertion. Vascular:  [] Pain in legs with walking   [] Pain in legs at rest  [] History of DVT   [] Phlebitis   [x] Swelling in legs   [] Varicose veins   [] Non-healing ulcers Pulmonary:   [] Uses home oxygen   [] Productive cough   [] Hemoptysis   [] Wheeze  [] COPD   [] Asthma Neurologic:  [] Dizziness   [] Seizures   [] History of stroke   [] History of TIA  [] Aphasia   [] Vissual changes   [] Weakness or numbness in arm   [] Weakness or numbness in leg Musculoskeletal:   [] Joint swelling   [x] Joint pain   [] Low back pain Hematologic:  [] Easy bruising  [] Easy bleeding   [x] Hypercoagulable state   [] Anemic Gastrointestinal:  []   Diarrhea   [] Vomiting  [x] Gastroesophageal reflux/heartburn   [] Difficulty swallowing. Genitourinary:  [] Chronic kidney disease   [] Difficult urination  [] Frequent urination   [] Blood in urine Skin:  [] Rashes   [] Ulcers  Psychological:  [] History of anxiety   []  History of major depression.  Physical Examination  Vitals:   03/04/21 1427  BP: 111/77  Pulse: 83  Weight: 197 lb (89.4 kg)  Height: 5\' 1"  (1.549 m)   Body mass index is 37.22 kg/m. Gen: WD/WN, NAD Head: [place]/AT, No temporalis wasting.  Ear/Nose/Throat: Hearing grossly intact, nares w/o erythema or drainage, pinna without lesions Eyes: PER, EOMI, sclera nonicteric.  Neck: Supple, no gross masses.  No JVD.  Pulmonary:  Good air movement, no audible wheezing, no use of accessory muscles.  Cardiac: RRR, precordium not hyperdynamic. Vascular:  scattered varicosities present bilaterally.  Moderatevenous stasis changes to the legs bilaterally.  3-4+ soft pitting edema  Vessel Right  Left  Radial Palpable Palpable  Gastrointestinal: soft, non-distended. No guarding/no peritoneal signs.  Musculoskeletal: M/S 5/5 throughout.  No deformity.  Neurologic: CN 2-12 intact. Pain and light touch intact in extremities.  Symmetrical.  Speech is fluent. Motor exam as listed above. Psychiatric: Judgment intact, Mood & affect appropriate for pt's clinical situation. Dermatologic: Moderate venous rashes no ulcers noted.  No changes consistent with cellulitis. Lymph : No lichenification or skin changes of chronic lymphedema.  CBC Lab Results  Component Value Date   WBC 3.4 (L) 11/09/2020   HGB 12.5 11/09/2020   HCT 37.7 11/09/2020   MCV 89.3 11/09/2020   PLT 292 11/09/2020    BMET    Component Value Date/Time   NA 135 07/27/2020 0805   NA 140 08/23/2013 1720   K 3.9 07/27/2020 0805   K 3.7 08/23/2013 1720   CL 108 07/27/2020 0805   CL 105 08/23/2013 1720   CO2 22 07/27/2020 0805   CO2 25 08/23/2013 1720   GLUCOSE 89 07/27/2020 0805   GLUCOSE 86 08/23/2013 1720   BUN 13 07/27/2020 0805   BUN 7 08/23/2013 1720   CREATININE 0.73 07/27/2020 0805   CREATININE 0.84 08/23/2013 1720   CALCIUM 8.7 (L) 07/27/2020 0805   CALCIUM 8.6 08/23/2013 1720   GFRNONAA >60 07/27/2020 0805   GFRNONAA >60 08/23/2013 1720   GFRAA >60 01/17/2019 0604   GFRAA >60 08/23/2013 1720   CrCl cannot be calculated (Patient's most recent lab result is older than the maximum 21 days allowed.).  COAG Lab Results  Component Value Date   INR 1.0 05/11/2018    Radiology MR LUMBAR SPINE WO CONTRAST  Result Date: 02/26/2021 CLINICAL DATA:  Low back and bilateral leg pain. EXAM: MRI LUMBAR SPINE WITHOUT CONTRAST TECHNIQUE: Multiplanar, multisequence MR imaging of the lumbar spine was performed. No intravenous contrast was administered. COMPARISON:  Lumbar spine CT 02/28/2014 FINDINGS: Segmentation:  Standard. Alignment:  Normal. Vertebrae: No fracture, suspicious marrow lesion, or significant marrow  edema. Conus medullaris and cauda equina: Conus extends to the T12-L1 level. Conus and cauda equina appear normal. Paraspinal and other soft tissues: Unremarkable. Disc levels: T12-L1 and L1-2: Negative. L2-3: Minimal disc bulging without stenosis. L3-4: Disc desiccation and slight disc space narrowing. Disc bulging and a left subarticular to foraminal disc protrusion result in mild left greater than right lateral recess stenosis and mild right and mild-to-moderate left neural foraminal stenosis without spinal stenosis. Potential left L3 nerve impingement near the lateral aspect of the foramen. L4-5: Disc desiccation and slight disc space narrowing. Disc bulging  and mild facet hypertrophy without significant stenosis. L5-S1: Severe right and mild left facet arthrosis without disc herniation or stenosis. IMPRESSION: 1. Mild lumbar disc degeneration, greatest at L3-4 where there is mild-to-moderate left neural foraminal stenosis due to a disc protrusion. 2. Severe right facet arthrosis at L5-S1. Electronically Signed   By: Logan Bores M.D.   On: 02/26/2021 22:09     Assessment/Plan 1. Bilateral leg pain I have had a long discussion with the patient regarding swelling and why it  causes symptoms.  Patient will begin wearing graduated compression stockings class 1 (20-30 mmHg) on a daily basis a prescription was given. The patient will  beginning wearing the stockings first thing in the morning and removing them in the evening. The patient is instructed specifically not to sleep in the stockings.   In addition, behavioral modification will be initiated.  This will include frequent elevation, use of over the counter pain medications and exercise such as walking.  I have reviewed systemic causes for chronic edema such as liver, kidney and cardiac etiologies.  The patient denies problems with these organ systems.    Consideration for a lymph pump will also be made based upon the effectiveness of conservative  therapy.  This would help to improve the edema control and prevent sequela such as ulcers and infections   Patient should undergo duplex ultrasound of the venous system to ensure that DVT or reflux is not present.  The patient will follow-up with me after the ultrasound.   - VAS [place] LOWER EXTREMITY VENOUS REFLUX; Future  2. Lymphedema I have had a long discussion with the patient regarding swelling and why it  causes symptoms.  Patient will begin wearing graduated compression stockings class 1 (20-30 mmHg) on a daily basis a prescription was given. The patient will  beginning wearing the stockings first thing in the morning and removing them in the evening. The patient is instructed specifically not to sleep in the stockings.   In addition, behavioral modification will be initiated.  This will include frequent elevation, use of over the counter pain medications and exercise such as walking.  I have reviewed systemic causes for chronic edema such as liver, kidney and cardiac etiologies.  The patient denies problems with these organ systems.    Consideration for a lymph pump will also be made based upon the effectiveness of conservative therapy.  This would help to improve the edema control and prevent sequela such as ulcers and infections   Patient should undergo duplex ultrasound of the venous system to ensure that DVT or reflux is not present.  The patient will follow-up with me after the ultrasound.   - VAS [place] LOWER EXTREMITY VENOUS REFLUX; Future  3. Chronic venous insufficiency I have had a long discussion with the patient regarding swelling and why it  causes symptoms.  Patient will begin wearing graduated compression stockings class 1 (20-30 mmHg) on a daily basis a prescription was given. The patient will  beginning wearing the stockings first thing in the morning and removing them in the evening. The patient is instructed specifically not to sleep in the stockings.   In addition,  behavioral modification will be initiated.  This will include frequent elevation, use of over the counter pain medications and exercise such as walking.  I have reviewed systemic causes for chronic edema such as liver, kidney and cardiac etiologies.  The patient denies problems with these organ systems.    Consideration for a lymph pump will  also be made based upon the effectiveness of conservative therapy.  This would help to improve the edema control and prevent sequela such as ulcers and infections   Patient should undergo duplex ultrasound of the venous system to ensure that DVT or reflux is not present.  The patient will follow-up with me after the ultrasound.   - VAS [place] LOWER EXTREMITY VENOUS REFLUX; Future  4. Chronic pulmonary embolism, unspecified pulmonary embolism type, unspecified whether acute cor pulmonale present ([place]) Continue current therapy no changes  5. Gastroesophageal reflux disease, unspecified whether esophagitis present Continue PPI as already ordered, this medication has been reviewed and there are no changes at this time.  Avoidence of caffeine and alcohol  Moderate elevation of the head of the bed      Hortencia Pilar, MD  03/07/2021 10:49 AM

## 2021-03-08 ENCOUNTER — Other Ambulatory Visit: Payer: Self-pay

## 2021-03-08 ENCOUNTER — Inpatient Hospital Stay: Payer: BC Managed Care – PPO | Attending: Internal Medicine

## 2021-03-08 ENCOUNTER — Inpatient Hospital Stay: Payer: BC Managed Care – PPO | Admitting: Internal Medicine

## 2021-03-08 ENCOUNTER — Encounter: Payer: Self-pay | Admitting: Internal Medicine

## 2021-03-08 ENCOUNTER — Inpatient Hospital Stay: Payer: BC Managed Care – PPO

## 2021-03-08 DIAGNOSIS — E538 Deficiency of other specified B group vitamins: Secondary | ICD-10-CM | POA: Diagnosis present

## 2021-03-08 DIAGNOSIS — M069 Rheumatoid arthritis, unspecified: Secondary | ICD-10-CM | POA: Insufficient documentation

## 2021-03-08 DIAGNOSIS — E611 Iron deficiency: Secondary | ICD-10-CM

## 2021-03-08 DIAGNOSIS — Z79899 Other long term (current) drug therapy: Secondary | ICD-10-CM | POA: Insufficient documentation

## 2021-03-08 DIAGNOSIS — Z86711 Personal history of pulmonary embolism: Secondary | ICD-10-CM | POA: Insufficient documentation

## 2021-03-08 DIAGNOSIS — D508 Other iron deficiency anemias: Secondary | ICD-10-CM | POA: Insufficient documentation

## 2021-03-08 DIAGNOSIS — Z9884 Bariatric surgery status: Secondary | ICD-10-CM | POA: Insufficient documentation

## 2021-03-08 DIAGNOSIS — R760 Raised antibody titer: Secondary | ICD-10-CM

## 2021-03-08 LAB — CBC WITH DIFFERENTIAL/PLATELET
Abs Immature Granulocytes: 0.02 10*3/uL (ref 0.00–0.07)
Basophils Absolute: 0 10*3/uL (ref 0.0–0.1)
Basophils Relative: 1 %
Eosinophils Absolute: 0.1 10*3/uL (ref 0.0–0.5)
Eosinophils Relative: 2 %
HCT: 41.4 % (ref 36.0–46.0)
Hemoglobin: 13.7 g/dL (ref 12.0–15.0)
Immature Granulocytes: 0 %
Lymphocytes Relative: 33 %
Lymphs Abs: 1.6 10*3/uL (ref 0.7–4.0)
MCH: 30.3 pg (ref 26.0–34.0)
MCHC: 33.1 g/dL (ref 30.0–36.0)
MCV: 91.6 fL (ref 80.0–100.0)
Monocytes Absolute: 0.3 10*3/uL (ref 0.1–1.0)
Monocytes Relative: 6 %
Neutro Abs: 2.9 10*3/uL (ref 1.7–7.7)
Neutrophils Relative %: 58 %
Platelets: 314 10*3/uL (ref 150–400)
RBC: 4.52 MIL/uL (ref 3.87–5.11)
RDW: 13.1 % (ref 11.5–15.5)
WBC: 4.9 10*3/uL (ref 4.0–10.5)
nRBC: 0 % (ref 0.0–0.2)

## 2021-03-08 MED ORDER — IRON SUCROSE 20 MG/ML IV SOLN
200.0000 mg | Freq: Once | INTRAVENOUS | Status: AC
  Administered 2021-03-08: 200 mg via INTRAVENOUS
  Filled 2021-03-08: qty 10

## 2021-03-08 MED ORDER — SODIUM CHLORIDE 0.9 % IV SOLN: 200.0000 mg | Freq: Once | INTRAVENOUS | Status: DC

## 2021-03-08 MED ORDER — CYANOCOBALAMIN 1000 MCG/ML IJ SOLN
1000.0000 ug | Freq: Once | INTRAMUSCULAR | Status: AC
  Administered 2021-03-08: 1000 ug via INTRAMUSCULAR
  Filled 2021-03-08: qty 1

## 2021-03-08 MED ORDER — SODIUM CHLORIDE 0.9 % IV SOLN
Freq: Once | INTRAVENOUS | Status: AC
  Filled 2021-03-08: qty 250

## 2021-03-08 NOTE — Assessment & Plan Note (Deleted)
#   Iron def/ B12 def- gastric bypass/-B12 deficiency on B12 injection-July 2022-ferritin 6/iron deficiency-s/p IV iron infusion tolerated well.  Improved energy levels.  Proceed with B12/iron infusions every 3 to 4 months.  # RA- poorly controlled- refer to Dr.Patel [s/p arthro- sec to RA]-   # DISPOSITION: # venofer/ B12 today # Follow up MD 4 months- labs- cbc/possible venofer; B12 injection-dr.B # Dr.B

## 2021-03-08 NOTE — Progress Notes (Signed)
[facility] NOTE  Patient Care Team: Idelle Crouch, MD as PCP - General (Internal Medicine)  CHIEF COMPLAINTS/PURPOSE OF CONSULTATION: Iron/B12 deficiency  #Iron deficiency/B12 deficiency-gastric bypass; poor tolerance to p.o. iron.  # 2002- PE [2 weeks post partum] x18 months- coumadin; 2009- SVT- right chest wall- [[facility]]-short-term anticoagulation;  # ANTICRADIOLIPIN ANTIBODY[rheumatology evaluation; Dr.Patel];-08/2019--Pos: Anticardiolipin Ab IgM; Neg: Anticardiolipin Ab IgG, Beta-2Glyco 1 IgA, IgM, IgG, No lupus anticoagulant;   # Rheumatoid arthritis, stage 1; Diagnosed 08/2019; Positive RF; synovitis of the joint; elevated sed rate'l  Continue HCQ 200 mg BID  # Hx of gastric Bypass; hx of COVID infection admitted [Jan 2021]    Oncology History   No history exists.   HISTORY OF PRESENTING ILLNESS: Alone.  Ambulating independently.    Debbie Sosa 46 y.o.  female with iron deficiency/B12 deficiency gastric bypass is here for follow-up.   Denies any blood in stools or black or stools.  Mild fatigue.  Not any worse.  Poor tolerance to oral iron.  Review of Systems  Constitutional:  Positive for malaise/fatigue. Negative for chills, diaphoresis, fever and weight loss.  HENT:  Negative for nosebleeds and sore throat.   Eyes:  Negative for double vision.  Respiratory:  Negative for cough, hemoptysis, sputum production, shortness of breath and wheezing.   Cardiovascular:  Negative for chest pain, palpitations, orthopnea and leg swelling.  Gastrointestinal:  Negative for abdominal pain, blood in stool, constipation, diarrhea, heartburn, melena, nausea and vomiting.  Genitourinary:  Negative for dysuria, frequency and urgency.  Musculoskeletal:  Positive for back pain and joint pain.  Skin: Negative.  Negative for itching and rash.  Neurological:  Negative for dizziness, tingling, focal weakness, weakness and headaches.  Endo/Heme/Allergies:  Does not  bruise/bleed easily.  Psychiatric/Behavioral:  Negative for depression. The patient is not nervous/anxious and does not have insomnia.     MEDICAL HISTORY:  Past Medical History:  Diagnosis Date   Asthma    Pulmonary embolism ([place])     SURGICAL HISTORY: Past Surgical History:  Procedure Laterality Date   BREAST BIOPSY Left 2006   benign/clip   BREAST BIOPSY Right 2015   benign/clip   BREAST REDUCTION SURGERY     CESAREAN SECTION     CESAREAN SECTION     CHOLECYSTECTOMY     COLONOSCOPY     COLONOSCOPY WITH PROPOFOL N/A 10/31/2018   Procedure: COLONOSCOPY WITH PROPOFOL;  Surgeon: [name], MD;  Location: [facility] ENDOSCOPY;  Service: Gastroenterology;  Laterality: N/A;   ESOPHAGOGASTRODUODENOSCOPY (EGD) WITH PROPOFOL N/A 10/04/2016   Procedure: ESOPHAGOGASTRODUODENOSCOPY (EGD) WITH PROPOFOL;  Surgeon: [name], MD;  Location[facility] ENDOSCOPY;  Service: Gastroenterology;  Laterality: N/A;   ESOPHAGOGASTRODUODENOSCOPY (EGD) WITH PROPOFOL N/A 04/14/2017   Procedure: ESOPHAGOGASTRODUODENOSCOPY (EGD) WITH PROPOFOL;  Surgeon: [name], MD;  Location: [facility] ENDOSCOPY;  Service: Gastroenterology;  Laterality: N/A;   ESOPHAGOGASTRODUODENOSCOPY (EGD) WITH PROPOFOL N/A 10/31/2018   Procedure: ESOPHAGOGASTRODUODENOSCOPY (EGD) WITH PROPOFOL;  Surgeon: [name], MD;  Location: [facility] ENDOSCOPY;  Service: Gastroenterology;  Laterality: N/A;   ESOPHAGOGASTRODUODENOSCOPY (EGD) WITH PROPOFOL N/A 06/28/2019   Procedure: ESOPHAGOGASTRODUODENOSCOPY (EGD) WITH PROPOFOL;  Surgeon: [name], MD;  Location: [facility] ENDOSCOPY;  Service: Gastroenterology;  Laterality: N/A;   ESOPHAGOGASTRODUODENOSCOPY (EGD) WITH PROPOFOL N/A 02/10/2021   Procedure: ESOPHAGOGASTRODUODENOSCOPY (EGD) WITH PROPOFOL;  Surgeon: [name], MD;  Location: [facility] ENDOSCOPY;  Service: Gastroenterology;  Laterality: N/A;   GASTRIC BYPASS     01/2015   KNEE SURGERY     NASAL SINUS SURGERY  PLANTAR FASCIA SURGERY     x 3   REDUCTION  MAMMAPLASTY Bilateral 2005    SOCIAL HISTORY: Social History   Socioeconomic History   Marital status: Single    Spouse name: Not on file   Number of children: Not on file   Years of education: Not on file   Highest education level: Not on file  Occupational History   Not on file  Tobacco Use   Smoking status: Never   Smokeless tobacco: Never  Vaping Use   Vaping Use: Never used  Substance and Sexual Activity   Alcohol use: No   Drug use: No   Sexual activity: Not on file  Other Topics Concern   Not on file  Social History Narrative   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    Social Determinants of Radio broadcast assistant Strain: Not on file  Food Insecurity: Not on file  Transportation Needs: Not on file  Physical Activity: Not on file  Stress: Not on file  Social Connections: Not on file  Intimate Partner Violence: Not on file    FAMILY HISTORY: Family History  Problem Relation Age of Onset   Breast cancer Mother 34   Breast cancer Cousin 34   Breast cancer Cousin 53   Asthma Father    Asthma Sister    Asthma Child     ALLERGIES:  is allergic to cephalexin, fexofenadine, iodinated contrast media, ketorolac, morphine, phenazopyridine, promethazine, diflucan [fluconazole], cetirizine, latex, morphine and related, other, pyridium [phenazopyridine hcl], tramadol, vioxx [rofecoxib], hepatitis b virus vaccines, naproxen, oxycodone-acetaminophen, sulfa antibiotics, and sulfasalazine.  MEDICATIONS:  Current Outpatient Medications  Medication Sig Dispense Refill   albuterol (VENTOLIN HFA) 108 (90 Base) MCG/ACT inhaler Inhale 1-2 puffs INH Q4-6hr prn for chest tightness, cough, wheezing, SOB/DOE. 18 g 0   clobetasol cream (TEMOVATE) 6.37 % Apply 1 application topically 2 (two) times daily. 30 g 0   cyanocobalamin (,VITAMIN B-12,) 1000 MCG/ML injection Inject into the muscle.      cyclobenzaprine (FLEXERIL) 5 MG tablet      EPINEPHrine (ADRENALIN) 1 MG/ML SOLN injection See admin instructions.     EPINEPHrine (EPIPEN 2-PAK) 0.3 mg/0.3 mL IJ SOAJ injection Inject 0.3 mLs (0.3 mg total) into the muscle as needed for anaphylaxis. 1 each 1   montelukast (SINGULAIR) 10 MG tablet Take 10 mg by mouth daily.      omeprazole (PRILOSEC) 40 MG capsule Take 1 capsule (40 mg total) by mouth in the morning and at bedtime. 180 capsule 3   Vitamin D, Ergocalciferol, (DRISDOL) 1.25 MG (50000 UT) CAPS capsule Take 50,000 Units by mouth once a week.     sucralfate (CARAFATE) 1 g tablet Take 1 tablet (1 g total) by mouth 4 (four) times daily for 14 days. 56 tablet 0   No current facility-administered medications for this visit.      Marland Kitchen  PHYSICAL EXAMINATION:  Vitals:   03/08/21 1308  BP: 120/75  Pulse: 87  Temp: 98.6 F (37 C)  SpO2: 98%   Filed Weights   03/08/21 1308  Weight: 195 lb 6.4 oz (88.6 kg)    Physical Exam HENT:     Head: Normocephalic and atraumatic.     Mouth/Throat:     Pharynx: No oropharyngeal exudate.  Eyes:     Pupils: Pupils are equal, round, and reactive to light.  Cardiovascular:     Rate and Rhythm: Normal  rate and regular rhythm.  Pulmonary:     Effort: No respiratory distress.     Breath sounds: No wheezing.  Abdominal:     General: Bowel sounds are normal. There is no distension.     Palpations: Abdomen is soft. There is no mass.     Tenderness: There is no abdominal tenderness. There is no guarding or rebound.  Musculoskeletal:        General: No tenderness. Normal range of motion.     Cervical back: Normal range of motion and neck supple.  Skin:    General: Skin is warm.  Neurological:     Mental Status: She is alert and oriented to person, place, and time.  Psychiatric:        Mood and Affect: Affect normal.     LABORATORY DATA:  I have reviewed the data as listed Lab Results  Component Value Date   WBC 4.9 03/08/2021   HGB  13.7 03/08/2021   HCT 41.4 03/08/2021   MCV 91.6 03/08/2021   PLT 314 03/08/2021   Recent Labs    07/27/20 0805  NA 135  K 3.9  CL 108  CO2 22  GLUCOSE 89  BUN 13  CREATININE 0.73  CALCIUM 8.7*  GFRNONAA >60  PROT 7.6  ALBUMIN 3.6  AST 21  ALT 19  ALKPHOS 75  BILITOT 0.4    RADIOGRAPHIC STUDIES: I have personally reviewed the radiological images as listed and agreed with the findings in the report. MR LUMBAR SPINE WO CONTRAST  Result Date: 02/26/2021 CLINICAL DATA:  Low back and bilateral leg pain. EXAM: MRI LUMBAR SPINE WITHOUT CONTRAST TECHNIQUE: Multiplanar, multisequence MR imaging of the lumbar spine was performed. No intravenous contrast was administered. COMPARISON:  Lumbar spine CT 02/28/2014 FINDINGS: Segmentation:  Standard. Alignment:  Normal. Vertebrae: No fracture, suspicious marrow lesion, or significant marrow edema. Conus medullaris and cauda equina: Conus extends to the T12-L1 level. Conus and cauda equina appear normal. Paraspinal and other soft tissues: Unremarkable. Disc levels: T12-L1 and L1-2: Negative. L2-3: Minimal disc bulging without stenosis. L3-4: Disc desiccation and slight disc space narrowing. Disc bulging and a left subarticular to foraminal disc protrusion result in mild left greater than right lateral recess stenosis and mild right and mild-to-moderate left neural foraminal stenosis without spinal stenosis. Potential left L3 nerve impingement near the lateral aspect of the foramen. L4-5: Disc desiccation and slight disc space narrowing. Disc bulging and mild facet hypertrophy without significant stenosis. L5-S1: Severe right and mild left facet arthrosis without disc herniation or stenosis. IMPRESSION: 1. Mild lumbar disc degeneration, greatest at L3-4 where there is mild-to-moderate left neural foraminal stenosis due to a disc protrusion. 2. Severe right facet arthrosis at L5-S1. Electronically Signed   By: Logan Bores M.D.   On: 02/26/2021 22:09      ASSESSMENT & PLAN:   Iron deficiency # Iron def/ B12 def- gastric bypass/-B12 deficiency on B12 injection-July 2022-ferritin 6/iron deficiency-s/p IV iron infusion tolerated well.  Improved energy levels.  Proceed with maintenance B12/iron infusions every 3 to 4 months.  # RA- poorly controlled- refer to Dr.Patel [s/p arthro- sec to RA]-   # DISPOSITION: # venofer/ B12 today # Follow up MD 4 months- labs- cbc/bmp; iron studies/ferritin; B12 possible venofer; B12 injection-dr.B # Dr.B  All questions were answered. The patient knows to call the clinic with any problems, questions or concerns.   Cammie Sickle, MD 03/08/2021 5:02 PM

## 2021-03-08 NOTE — Assessment & Plan Note (Addendum)
#   Iron def/ B12 def- gastric bypass/-B12 deficiency on B12 injection-July 2022-ferritin 6/iron deficiency-s/p IV iron infusion tolerated well.  Improved energy levels.  Proceed with maintenance B12/iron infusions every 3 to 4 months.  # RA- poorly controlled- refer to Dr.Patel [s/p arthro- sec to RA]-   # DISPOSITION: # venofer/ B12 today # Follow up MD 4 months- labs- cbc/bmp; iron studies/ferritin; B12 possible venofer; B12 injection-dr.B # Dr.B

## 2021-03-24 ENCOUNTER — Ambulatory Visit (INDEPENDENT_AMBULATORY_CARE_PROVIDER_SITE_OTHER): Payer: BC Managed Care – PPO | Admitting: Nurse Practitioner

## 2021-03-24 ENCOUNTER — Ambulatory Visit (INDEPENDENT_AMBULATORY_CARE_PROVIDER_SITE_OTHER): Payer: BC Managed Care – PPO

## 2021-03-24 ENCOUNTER — Encounter (INDEPENDENT_AMBULATORY_CARE_PROVIDER_SITE_OTHER): Payer: Self-pay | Admitting: Nurse Practitioner

## 2021-03-24 ENCOUNTER — Other Ambulatory Visit: Payer: Self-pay

## 2021-03-24 VITALS — BP 110/75 | HR 74 | Resp 16 | Ht 61.5 in | Wt 202.0 lb

## 2021-03-24 DIAGNOSIS — I890 Lymphedema, not elsewhere classified: Secondary | ICD-10-CM

## 2021-03-24 DIAGNOSIS — M79604 Pain in right leg: Secondary | ICD-10-CM | POA: Diagnosis not present

## 2021-03-24 DIAGNOSIS — I872 Venous insufficiency (chronic) (peripheral): Secondary | ICD-10-CM

## 2021-03-24 DIAGNOSIS — M79605 Pain in left leg: Secondary | ICD-10-CM | POA: Diagnosis not present

## 2021-04-05 ENCOUNTER — Encounter (INDEPENDENT_AMBULATORY_CARE_PROVIDER_SITE_OTHER): Payer: Self-pay | Admitting: Nurse Practitioner

## 2021-04-05 NOTE — Progress Notes (Signed)
? ?Subjective:  ? ? Patient ID: Debbie Sosa, female    DOB: 1975/08/16, 46 y.o.   MRN: [id number] ?Chief Complaint  ?Patient presents with  ? Follow-up  ?  ultrasound  ? ? ?The patient returns to the office for followup evaluation regarding leg swelling.  The swelling has persisted and the pain associated with swelling continues. There have not been any interval development of a ulcerations or wounds.  The patient previously had a DVT and PE in 2002 however she is not sure which leg but her right leg does swell worse. ? ?Since the previous visit the patient has been wearing graduated compression stockings and has noted little if any improvement in the lymphedema. The patient has been using compression routinely morning until night. ? ?The patient also states elevation during the day and exercise is being done too.  ?  ? ?Today noninvasive studies show no superficial thrombophlebitis seen bilaterally.  No evidence of DVT noted bilaterally.  No evidence of deep venous insufficiency or superficial venous reflux bilaterally. ? ? ?Review of Systems  ?Cardiovascular:  Positive for leg swelling.  ?All other systems reviewed and are negative. ? ?   ?Objective:  ? Physical Exam ?Vitals reviewed.  ?HENT:  ?   Head: Normocephalic.  ?Cardiovascular:  ?   Rate and Rhythm: Normal rate.  ?   Pulses: Normal pulses.  ?Pulmonary:  ?   Effort: Pulmonary effort is normal.  ?Musculoskeletal:  ?   Right lower leg: Edema present.  ?   Left lower leg: Edema present.  ?Skin: ?   General: Skin is warm and dry.  ?   Comments: Dermal thickening bilaterally  ?Neurological:  ?   Mental Status: She is alert and oriented to person, place, and time.  ?Psychiatric:     ?   Mood and Affect: Mood normal.     ?   Behavior: Behavior normal.     ?   Thought Content: Thought content normal.     ?   Judgment: Judgment normal.  ? ? ?BP 110/75 (BP Location: Right Arm)   Pulse 74   Resp 16   Ht 5' 1.5" (1.562 m)   Wt 202 lb (91.6 kg)   BMI 37.55  kg/m?  ? ?Past Medical History:  ?Diagnosis Date  ? Asthma   ? Pulmonary embolism ([place])   ? ? ?Social History  ? ?Socioeconomic History  ? Marital status: Single  ?  Spouse name: Not on file  ? Number of children: Not on file  ? Years of education: Not on file  ? Highest education level: Not on file  ?Occupational History  ? Not on file  ?Tobacco Use  ? Smoking status: Never  ? Smokeless tobacco: Never  ?Vaping Use  ? Vaping Use: Never used  ?Substance and Sexual Activity  ? Alcohol use: No  ? Drug use: No  ? Sexual activity: Not on file  ?Other Topics Concern  ? Not on file  ?Social History Narrative  ? 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   ? ?Social Determinants of Health  ? ?Financial Resource Strain: Not on file  ?Food Insecurity: Not on file  ?Transportation Needs: Not on file  ?Physical Activity: Not on file  ?Stress: Not on file  ?Social Connections: Not on file  ?Intimate Partner Violence: Not on file  ? ? ?Past Surgical History:  ?Procedure Laterality Date  ? BREAST  BIOPSY Left 2006  ? benign/clip  ? BREAST BIOPSY Right 2015  ? benign/clip  ? BREAST REDUCTION SURGERY    ? CESAREAN SECTION    ? CESAREAN SECTION    ? CHOLECYSTECTOMY    ? COLONOSCOPY    ? COLONOSCOPY WITH PROPOFOL N/A 10/31/2018  ? Procedure: COLONOSCOPY WITH PROPOFOL;  Surgeon: [name], MD;  Location: [facility] ENDOSCOPY;  Service: Gastroenterology;  Laterality: N/A;  ? ESOPHAGOGASTRODUODENOSCOPY (EGD) WITH PROPOFOL N/A 10/04/2016  ? Procedure: ESOPHAGOGASTRODUODENOSCOPY (EGD) WITH PROPOFOL;  Surgeon: [name], MD;  Location: [facility] ENDOSCOPY;  Service: Gastroenterology;  Laterality: N/A;  ? ESOPHAGOGASTRODUODENOSCOPY (EGD) WITH PROPOFOL N/A 04/14/2017  ? Procedure: ESOPHAGOGASTRODUODENOSCOPY (EGD) WITH PROPOFOL;  Surgeon: [name], MD;  Location: [facility] ENDOSCOPY;  Service: Gastroenterology;  Laterality: N/A;  ? ESOPHAGOGASTRODUODENOSCOPY (EGD) WITH  PROPOFOL N/A 10/31/2018  ? Procedure: ESOPHAGOGASTRODUODENOSCOPY (EGD) WITH PROPOFOL;  Surgeon: [name], MD;  Location: [facility] ENDOSCOPY;  Service: Gastroenterology;  Laterality: N/A;  ? ESOPHAGOGASTRODUODENOSCOPY (EGD) WITH PROPOFOL N/A 06/28/2019  ? Procedure: ESOPHAGOGASTRODUODENOSCOPY (EGD) WITH PROPOFOL;  Surgeon: [name], MD;  Location: [facility] ENDOSCOPY;  Service: Gastroenterology;  Laterality: N/A;  ? ESOPHAGOGASTRODUODENOSCOPY (EGD) WITH PROPOFOL N/A 02/10/2021  ? Procedure: ESOPHAGOGASTRODUODENOSCOPY (EGD) WITH PROPOFOL;  Surgeon: [name], MD;  Location: [facility] ENDOSCOPY;  Service: Gastroenterology;  Laterality: N/A;  ? GASTRIC BYPASS    ? 01/2015  ? KNEE SURGERY    ? NASAL SINUS SURGERY    ? PLANTAR FASCIA SURGERY    ? x 3  ? REDUCTION MAMMAPLASTY Bilateral 2005  ? ? ?Family History  ?Problem Relation Age of Onset  ? Breast cancer Mother 77  ? Breast cancer Cousin 30  ? Breast cancer Cousin 42  ? Asthma Father   ? Asthma Sister   ? Asthma Child   ? ? ?Allergies  ?Allergen Reactions  ? Cephalexin Hives and Rash  ? Fexofenadine Shortness Of Breath  ? Iodinated Contrast Media Hives  ? Ketorolac Hives  ? Morphine Anaphylaxis  ? Phenazopyridine Rash  ? Promethazine Hives  ? Diflucan [Fluconazole] Rash  ? Cetirizine Other (See Comments)  ?  Skin eruptures  ? Latex   ? Morphine And Related   ? Other Hives  ?  Pain medicine that starts with an L  ? Pyridium [Phenazopyridine Hcl]   ? Tramadol Hives  ? Vioxx [Rofecoxib]   ? Hepatitis B Virus Vaccines Rash  ? Naproxen Rash  ? Oxycodone-Acetaminophen Itching  ? Sulfa Antibiotics Rash  ? Sulfasalazine Rash  ? ? ?CBC Latest Ref Rng & Units 03/08/2021 11/09/2020 07/27/2020  ?WBC 4.0 - 10.5 K/uL 4.9 3.4(L) 4.5  ?Hemoglobin 12.0 - 15.0 g/dL 13.7 12.5 11.3(L)  ?Hematocrit 36.0 - 46.0 % 41.4 37.7 35.1(L)  ?Platelets 150 - 400 K/uL 314 292 342  ? ? ? ? ?CMP  ?   ?Component Value Date/Time  ? NA 135 07/27/2020 0805  ? NA 140 08/23/2013 1720  ? K 3.9 07/27/2020 0805  ? K 3.7  08/23/2013 1720  ? CL 108 07/27/2020 0805  ? CL 105 08/23/2013 1720  ? CO2 22 07/27/2020 0805  ? CO2 25 08/23/2013 1720  ? GLUCOSE 89 07/27/2020 0805  ? GLUCOSE 86 08/23/2013 1720  ? BUN 13 07/27/2020 0805  ? BUN 7 08/23/2013 1720  ? CREATININE 0.73 07/27/2020 0805  ? CREATININE 0.84 08/23/2013 1720  ? CALCIUM 8.7 (L) 07/27/2020 0805  ? CALCIUM 8.6 08/23/2013 1720  ? PROT 7.6 07/27/2020 0805  ? PROT 7.9 08/23/2013 1720  ? ALBUMIN  3.6 07/27/2020 0805  ? ALBUMIN 3.4 08/23/2013 1720  ? AST 21 07/27/2020 0805  ? AST 23 08/23/2013 1720  ? ALT 19 07/27/2020 0805  ? ALT 24 08/23/2013 1720  ? ALKPHOS 75 07/27/2020 0805  ? ALKPHOS 99 08/23/2013 1720  ? BILITOT 0.4 07/27/2020 0805  ? BILITOT 0.2 08/23/2013 1720  ? GFRNONAA >60 07/27/2020 0805  ? GFRNONAA >60 08/23/2013 1720  ? GFRAA >60 01/17/2019 0604  ? GFRAA >60 08/23/2013 1720  ? ? ? ?No results found. ? ?   ?Assessment & Plan:  ? ?1. Lymphedema ?Recommend: ? ?No surgery or intervention at this point in time.   ? ?I have reviewed my previous discussion with the patient regarding swelling and why it causes symptoms.  Patient will continue wearing graduated compression stockings class 1 (20-30 mmHg) on a daily basis. The patient will  beginning wearing the stockings first thing in the morning and removing them in the evening. The patient is instructed specifically not to sleep in the stockings.   ? ?In addition, behavioral modification including several periods of elevation of the lower extremities during the day will be continued.  This was reviewed with the patient during the initial visit. ? ?The patient will also continue routine exercise, especially walking on a daily basis as was discussed during the initial visit.   ? ?Despite conservative treatments including graduated compression therapy class 1 and behavioral modification including exercise and elevation the patient  has not obtained adequate control of the lymphedema.  The patient still has stage 3 lymphedema and  therefore, I believe that a lymph pump should be added to improve the control of the patient's lymphedema.  Additionally, a lymph pump is warranted because it will reduce the risk of cellulitis and ulceration in the

## 2021-04-15 ENCOUNTER — Encounter: Payer: BC Managed Care – PPO | Attending: Neurology | Admitting: Dietician

## 2021-04-15 ENCOUNTER — Encounter: Payer: Self-pay | Admitting: Dietician

## 2021-04-15 VITALS — Ht 61.5 in | Wt 204.5 lb

## 2021-04-15 DIAGNOSIS — D508 Other iron deficiency anemias: Secondary | ICD-10-CM | POA: Diagnosis not present

## 2021-04-15 DIAGNOSIS — E538 Deficiency of other specified B group vitamins: Secondary | ICD-10-CM | POA: Diagnosis not present

## 2021-04-15 DIAGNOSIS — Z9884 Bariatric surgery status: Secondary | ICD-10-CM | POA: Diagnosis not present

## 2021-04-15 DIAGNOSIS — R197 Diarrhea, unspecified: Secondary | ICD-10-CM | POA: Diagnosis not present

## 2021-04-15 DIAGNOSIS — E669 Obesity, unspecified: Secondary | ICD-10-CM

## 2021-04-15 NOTE — Patient Instructions (Signed)
Plan to include a veg or fruit with each meal. Eat only after eating the protein food.  ?Continue with therapy for back, and resume exercise gradually when cleared to do so. ?Consider taking probiotic again, such as Culturelle, to help with digestive health and possibly with nutrient absorption.  ?Take time to de-stress, unwind, even a few minutes of slow deep breathing can reduce physical effects of stress. ?

## 2021-04-15 NOTE — Progress Notes (Signed)
Medical Nutrition Therapy: Visit start time: 1520   end time: 4142  ?Assessment:  Diagnosis: obesity ?Other Medical history: Gastric Bypass surgery 2017; GERD ?Psychosocial issues/ stress concerns: patient reports high stress level, feels she is managing her stress well at this time. ? ?Current weight: 204.5lbs Height: 5'1.5"  BMI: 38 ? ?Medications, supplements: reconciled list in medical record ? ?Progress and evaluation:  ?Gastric bypass surgery 01/2015; unable to tolerate mvi orally, reports poor nutrient absorption since surgery.  ?Takes injections of iron and B12  ?Drinks some protein shakes but feels "burned out" on shakes. ?Allergic to some nuts, avocado (severe reaction), kiwi, bananas, cantaloupe, occ small portion strawberries.  ?Gets home 8-9pm, so often forgoes cooking for takeout meals. you pick 2 meal with salad from [employer] (triggers diarrhea); grilled chicken, avoids red meat due to rheumatoid arthritis ? Has some hypoglycemia episodes, keeps PB crackers, orange juice on hand to treat; has sx of hot flash, then shaky.  ? ?Physical activity: contraindicated at this time due to DDD, bulging discs; undergoing therapy ? ?Dietary Intake:  ?Usual eating pattern includes 3 meals and 1-2 snacks per day. ?Dining out frequency: 12-13 meals per week. ? ?Breakfast: egg McMuffin (avoids biscuits or eats 1/2); occ yogurt -- sometimes not well tolerated ?Snack: none ?Lunch: 11am school lunch; occ frozen meal; usu leftovers from restaurant meal ie grilled chicken, some broccoli (gas)/ occ corn/ mashed potatoes ?Snack: occ chips ?Supper: see above, usu out;  ?Snack: maybe ritz crackers, avoids due to reflux ?Beverages: water, sweet tea; orange juice esp when low BG symptoms; light cran grape , flavored water ? ?Intervention:  ? ?Nutrition Care Education: ? 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 ? ? 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  As evidenced by nutrient deficiencies, diarrhea. ?[place]-3.3 Overweight/obesity As related to limited activity due to bulging spinal discs and DDD, stress, excess calories.  As evidenced by patient with current BMI of 38. ? ? ?Education Materials given:  ?Plate Planner with food lists, sample meal pattern ?Hypoglycemia (not related to diabetes) nutrition therapy ?Sample bariatric menus ?Visit summary with goals/ instructions ? ? ?Learner/ who was taught:  ?Patient  ? ?Level of understanding: ?Verbalizes/ demonstrates competency ? ? ?Demonstrated degree of understanding via:   Teach back ?Learning barriers: ?None ? ?Willingness to learn/ readiness for change: ?Eager, change in progress ? ? ?Monitoring and Evaluation:  Dietary intake, exercise, GI symptoms, and body weight ?     follow up:  07/01/21 at 9:00am   ?

## 2021-05-18 ENCOUNTER — Telehealth (INDEPENDENT_AMBULATORY_CARE_PROVIDER_SITE_OTHER): Payer: Self-pay | Admitting: Vascular Surgery

## 2021-05-18 NOTE — Telephone Encounter (Signed)
Patient called and stated a Representative from [employer] said they are waiting on paperwork from this office to be completed so they can send out the leg compression machine.  Please advise ?

## 2021-06-16 ENCOUNTER — Encounter: Payer: Self-pay | Admitting: Internal Medicine

## 2021-07-01 ENCOUNTER — Encounter: Payer: Self-pay | Admitting: Dietician

## 2021-07-01 ENCOUNTER — Encounter: Payer: BC Managed Care – PPO | Attending: Neurology | Admitting: Dietician

## 2021-07-01 VITALS — Ht 61.5 in | Wt 203.9 lb

## 2021-07-01 DIAGNOSIS — D508 Other iron deficiency anemias: Secondary | ICD-10-CM

## 2021-07-01 DIAGNOSIS — E538 Deficiency of other specified B group vitamins: Secondary | ICD-10-CM

## 2021-07-01 DIAGNOSIS — E669 Obesity, unspecified: Secondary | ICD-10-CM

## 2021-07-01 DIAGNOSIS — R197 Diarrhea, unspecified: Secondary | ICD-10-CM

## 2021-07-01 DIAGNOSIS — Z9884 Bariatric surgery status: Secondary | ICD-10-CM

## 2021-07-01 NOTE — Progress Notes (Signed)
Medical Nutrition Therapy: Visit start time: 0900  end time: 0940  Assessment:  Diagnosis: obesity, s/p gastric bypass Medical history changes: diagnosed with lymphedema Psychosocial issues/ stress concerns: none  Current weight: 203.9lbs Height: 5'1.5" BMI: 37.9  Medications, supplement changes: reconciled list in medical record  Progress and evaluation:  Some swelling and pain in legs due to lymphedema Taking pre- and probiotic supplement, unsure yet if it is helping with bouts of loose stools Weight has been stable; patient has had multiple stressors with her health and son in serious motorcycle accident Has plans to share some cooking with neighbor over the summer [place] eating pattern was recommended to patient, so she tried a [employer] but did not like it.    Physical activity: walking (with neighbor) when pain is controlled  Dietary Intake:  Usual eating pattern includes 3 meals and 1-2 snacks per day. Dining out frequency: 8-10 meals per week.  Breakfast: egg mcmuffin (eats 1/2 of bread); skinny biscuit (eats 1/4);  Snack: colby jack cheese stick occasionally Lunch: frozen meal or leftovers; chicken taco Snack: none or same as am Supper: cooks grilled chicken/ occ flounder or red snapper + veg;  or japanese; soup or grilled chicken baked potato or pasta from chili's small amount; soup and salad at [employer]: usually none Beverages: water, stopped tea; occ sf lemonade mixed with crystal light  Intervention:   Nutrition Care Education:  Basic nutrition: reviewed appropriate nutrient balance; appropriate meal and snack schedule; general nutrition guidelines  Weight control: reviewed progress since previous visit; adequate protein intake and eating protein first in meals; sources of protein including vegetarian options Advanced nutrition: cooking techniques Other:  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 Patient in motivated to continue working on lifestyle improvements. Protein intake could be low with some meals lacking protein source. Updated nutrition goals with input from patient. She declined scheduling another visit at this time but will schedule later if needed. Her primary goal is managing lymphedema.  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  As evidenced by nutrient deficiencies, diarrhea. [place]-3.3 Overweight/obesity As related to limited activity due to bulging spinal discs and DDD, stress, excess calories.  As evidenced by patient with current BMI of 38.   Education Materials given:  Vegetarian Proteins list Visit summary with goals/ instructions   Learner/ who was taught:  Patient   Level of understanding: Verbalizes/ demonstrates competency   Demonstrated degree of understanding via:   Teach back Learning barriers: None  Willingness to learn/ readiness for change: Eager, change in progress   Monitoring and Evaluation:  Dietary intake, exercise, GI symptoms, and body weight      follow up: prn

## 2021-07-01 NOTE — Patient Instructions (Addendum)
Include a protein source with each meal and some snacks, 4-5 times a day. Good sources include boiled eggs, small amounts of peanut butter, low fat cheese, yogurt (low fat, low sugar),  Continue to eat protein first with meals Keep looking for other meal ideas; try bariatric resources such as baritastic (app), nutritiondirect.com, or bariatric pal (?). Yummly app is not bariatric specific but is helpful with meal/ recipe ideas and provides a grocery list Exercise regularly as tolerated; incorporate movement throughout the day a few minutes at a time to help with calorie burning.

## 2021-07-05 ENCOUNTER — Other Ambulatory Visit: Payer: Self-pay

## 2021-07-05 ENCOUNTER — Inpatient Hospital Stay: Payer: BC Managed Care – PPO

## 2021-07-05 ENCOUNTER — Inpatient Hospital Stay: Payer: BC Managed Care – PPO | Attending: Internal Medicine

## 2021-07-05 ENCOUNTER — Inpatient Hospital Stay (HOSPITAL_BASED_OUTPATIENT_CLINIC_OR_DEPARTMENT_OTHER): Payer: BC Managed Care – PPO | Admitting: Internal Medicine

## 2021-07-05 ENCOUNTER — Encounter: Payer: Self-pay | Admitting: Internal Medicine

## 2021-07-05 VITALS — BP 106/75 | HR 70

## 2021-07-05 DIAGNOSIS — E611 Iron deficiency: Secondary | ICD-10-CM

## 2021-07-05 DIAGNOSIS — E538 Deficiency of other specified B group vitamins: Secondary | ICD-10-CM | POA: Diagnosis not present

## 2021-07-05 DIAGNOSIS — M069 Rheumatoid arthritis, unspecified: Secondary | ICD-10-CM | POA: Insufficient documentation

## 2021-07-05 DIAGNOSIS — Z9884 Bariatric surgery status: Secondary | ICD-10-CM | POA: Diagnosis not present

## 2021-07-05 DIAGNOSIS — I890 Lymphedema, not elsewhere classified: Secondary | ICD-10-CM | POA: Insufficient documentation

## 2021-07-05 LAB — CBC WITH DIFFERENTIAL/PLATELET
Abs Immature Granulocytes: 0.02 10*3/uL (ref 0.00–0.07)
Basophils Absolute: 0 10*3/uL (ref 0.0–0.1)
Basophils Relative: 1 %
Eosinophils Absolute: 0.3 10*3/uL (ref 0.0–0.5)
Eosinophils Relative: 4 %
HCT: 39.7 % (ref 36.0–46.0)
Hemoglobin: 13.3 g/dL (ref 12.0–15.0)
Immature Granulocytes: 0 %
Lymphocytes Relative: 26 %
Lymphs Abs: 1.6 10*3/uL (ref 0.7–4.0)
MCH: 30.5 pg (ref 26.0–34.0)
MCHC: 33.5 g/dL (ref 30.0–36.0)
MCV: 91.1 fL (ref 80.0–100.0)
Monocytes Absolute: 0.5 10*3/uL (ref 0.1–1.0)
Monocytes Relative: 8 %
Neutro Abs: 3.8 10*3/uL (ref 1.7–7.7)
Neutrophils Relative %: 61 %
Platelets: 300 10*3/uL (ref 150–400)
RBC: 4.36 MIL/uL (ref 3.87–5.11)
RDW: 12.9 % (ref 11.5–15.5)
WBC: 6.2 10*3/uL (ref 4.0–10.5)
nRBC: 0 % (ref 0.0–0.2)

## 2021-07-05 LAB — IRON AND TIBC
Iron: 91 ug/dL (ref 28–170)
Saturation Ratios: 29 % (ref 10.4–31.8)
TIBC: 314 ug/dL (ref 250–450)
UIBC: 223 ug/dL

## 2021-07-05 LAB — BASIC METABOLIC PANEL
Anion gap: 9 (ref 5–15)
BUN: 12 mg/dL (ref 6–20)
CO2: 22 mmol/L (ref 22–32)
Calcium: 8.7 mg/dL — ABNORMAL LOW (ref 8.9–10.3)
Chloride: 107 mmol/L (ref 98–111)
Creatinine, Ser: 0.76 mg/dL (ref 0.44–1.00)
GFR, Estimated: >60 mL/min (ref >60)
Glucose, Bld: 103 mg/dL — ABNORMAL HIGH (ref 70–99)
Potassium: 3.9 mmol/L (ref 3.5–5.1)
Sodium: 138 mmol/L (ref 135–145)

## 2021-07-05 LAB — VITAMIN B12: Vitamin B-12: 154 pg/mL — ABNORMAL LOW (ref 180–914)

## 2021-07-05 LAB — FERRITIN: Ferritin: 61 ng/mL (ref 11–307)

## 2021-07-05 MED ORDER — SODIUM CHLORIDE 0.9 % IV SOLN: 200.0000 mg | Freq: Once | INTRAVENOUS | Status: DC

## 2021-07-05 MED ORDER — CYANOCOBALAMIN 1000 MCG/ML IJ SOLN
1000.0000 ug | Freq: Once | INTRAMUSCULAR | Status: AC
  Administered 2021-07-05: 1000 ug via INTRAMUSCULAR
  Filled 2021-07-05: qty 1

## 2021-07-05 MED ORDER — SODIUM CHLORIDE 0.9 % IV SOLN
Freq: Once | INTRAVENOUS | Status: AC
  Filled 2021-07-05: qty 250

## 2021-07-05 MED ORDER — IRON SUCROSE 20 MG/ML IV SOLN
200.0000 mg | Freq: Once | INTRAVENOUS | Status: AC
  Administered 2021-07-05: 200 mg via INTRAVENOUS
  Filled 2021-07-05: qty 10

## 2021-07-05 NOTE — Assessment & Plan Note (Addendum)
#   Iron def/ B12 def- gastric bypass/-B12 deficiency on B12 injection-July 2022-ferritin 6/iron deficiency-s/p IV iron infusion. POOR tolerance to oral iron.  # Improved energy levels.  Proceed with maintenance B12/iron infusions every 3 to 4 months.  # Left lower extremity swelling- Lymphedema [s/p Vascular]- lymphedema pump.   # RA- poorly controlled- refer to Dr.Patel [s/p arthro- sec to RA]-stable.  # Left ear middle infection itch- s/p cirpofloxacin; defer to Dr.Sparks/ENT-   # DISPOSITION: # venofer/ B12 today # Follow up MD 4 months- labs- cbc/bmp;possible venofer; B12 injection-dr.B

## 2021-07-05 NOTE — Progress Notes (Signed)
[facility] NOTE  Patient Care Team: Idelle Crouch, MD as PCP - General (Internal Medicine) Cammie Sickle, MD as Consulting Physician (Oncology)  CHIEF COMPLAINTS/PURPOSE OF CONSULTATION: Iron/B12 deficiency  #Iron deficiency/B12 deficiency-gastric bypass; poor tolerance to p.o. iron.  # 2002- PE [2 weeks post partum] x18 months- coumadin; 2009- SVT- right chest wall- [[facility]]-short-term anticoagulation;  # ANTICRADIOLIPIN ANTIBODY[rheumatology evaluation; Dr.Patel];-08/2019--Pos: Anticardiolipin Ab IgM; Neg: Anticardiolipin Ab IgG, Beta-2Glyco 1 IgA, IgM, IgG, No lupus anticoagulant;   # Rheumatoid arthritis, stage 1; Diagnosed 08/2019; Positive RF; synovitis of the joint; elevated sed rate'l  Continue HCQ 200 mg BID  # Hx of gastric Bypass; hx of COVID infection admitted [Jan 2021]    Oncology History   No history exists.   HISTORY OF PRESENTING ILLNESS: Alone.  Ambulating independently.    Debbie Sosa 46 y.o.  female with iron deficiency/B12 deficiency gastric bypass is here for follow-up.   Patient receiving B12 injections/iron infusions every 4 months or so.  Denies any blood in stools or black or stools.  Mild fatigue.  Not any worse.  Poor tolerance to oral iron.  Review of Systems  Constitutional:  Positive for malaise/fatigue. Negative for chills, diaphoresis, fever and weight loss.  HENT:  Negative for nosebleeds and sore throat.   Eyes:  Negative for double vision.  Respiratory:  Negative for cough, hemoptysis, sputum production, shortness of breath and wheezing.   Cardiovascular:  Negative for chest pain, palpitations, orthopnea and leg swelling.  Gastrointestinal:  Negative for abdominal pain, blood in stool, constipation, diarrhea, heartburn, melena, nausea and vomiting.  Genitourinary:  Negative for dysuria, frequency and urgency.  Musculoskeletal:  Positive for back pain and joint pain.  Skin: Negative.  Negative for itching  and rash.  Neurological:  Negative for dizziness, tingling, focal weakness, weakness and headaches.  Endo/Heme/Allergies:  Does not bruise/bleed easily.  Psychiatric/Behavioral:  Negative for depression. The patient is not nervous/anxious and does not have insomnia.      MEDICAL HISTORY:  Past Medical History:  Diagnosis Date   Asthma    DDD (degenerative disc disease), lumbar    Lymphoma ([place]) 2022   b/l legs [place] vein Dr. Owens Shark   Pulmonary embolism [facility])     SURGICAL HISTORY: Past Surgical History:  Procedure Laterality Date   BREAST BIOPSY Left 2006   benign/clip   BREAST BIOPSY Right 2015   benign/clip   BREAST REDUCTION SURGERY     CESAREAN SECTION     CESAREAN SECTION     CHOLECYSTECTOMY     COLONOSCOPY     COLONOSCOPY WITH PROPOFOL N/A 10/31/2018   Procedure: COLONOSCOPY WITH PROPOFOL;  Surgeon: [name], MD;  Location: [facility] ENDOSCOPY;  Service: Gastroenterology;  Laterality: N/A;   ESOPHAGOGASTRODUODENOSCOPY (EGD) WITH PROPOFOL N/A 10/04/2016   Procedure: ESOPHAGOGASTRODUODENOSCOPY (EGD) WITH PROPOFOL;  Surgeon: [name], MD;  Location:  [facility] ENDOSCOPY;  Service: Gastroenterology;  Laterality: N/A;   ESOPHAGOGASTRODUODENOSCOPY (EGD) WITH PROPOFOL N/A 04/14/2017   Procedure: ESOPHAGOGASTRODUODENOSCOPY (EGD) WITH PROPOFOL;  Surgeon: [name], MD;  Location: [facility] ENDOSCOPY;  Service: Gastroenterology;  Laterality: N/A;   ESOPHAGOGASTRODUODENOSCOPY (EGD) WITH PROPOFOL N/A 10/31/2018   Procedure: ESOPHAGOGASTRODUODENOSCOPY (EGD) WITH PROPOFOL;  Surgeon: [name], MD;  Location: [facility] ENDOSCOPY;  Service: Gastroenterology;  Laterality: N/A;   ESOPHAGOGASTRODUODENOSCOPY (EGD) WITH PROPOFOL N/A 06/28/2019   Procedure: ESOPHAGOGASTRODUODENOSCOPY (EGD) WITH PROPOFOL;  Surgeon: [name], MD;  Location: [facility] ENDOSCOPY;  Service: Gastroenterology;  Laterality: N/A;   ESOPHAGOGASTRODUODENOSCOPY (EGD) WITH PROPOFOL N/A 02/10/2021  Procedure: ESOPHAGOGASTRODUODENOSCOPY  (EGD) WITH PROPOFOL;  Surgeon: [name], MD;  Location: [facility] ENDOSCOPY;  Service: Gastroenterology;  Laterality: N/A;   GASTRIC BYPASS     01/2015   KNEE SURGERY     NASAL SINUS SURGERY     PLANTAR FASCIA SURGERY     x 3   REDUCTION MAMMAPLASTY Bilateral 2005    SOCIAL HISTORY: Social History   Socioeconomic History   Marital status: Single    Spouse name: Not on file   Number of children: Not on file   Years of education: Not on file   Highest education level: Not on file  Occupational History   Not on file  Tobacco Use   Smoking status: Never   Smokeless tobacco: Never  Vaping Use   Vaping Use: Never used  Substance and Sexual Activity   Alcohol use: No   Drug use: No   Sexual activity: Not on file  Other Topics Concern   Not on file  Social History Narrative   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    Social Determinants of Health   Financial Resource Strain: Not on file  Food Insecurity: Not on file  Transportation Needs: Not on file  Physical Activity: Not on file  Stress: Not on file  Social Connections: Not on file  Intimate Partner Violence: Not on file    FAMILY HISTORY: Family History  Problem Relation Age of Onset   Breast cancer Mother 38   Breast cancer Cousin 46   Breast cancer Cousin 43   Asthma Father    Asthma Sister    Asthma Child     ALLERGIES:  is allergic to cephalexin, fexofenadine, iodinated contrast media, ketorolac, morphine, phenazopyridine, promethazine, diflucan [fluconazole], cetirizine, diclofenac sodium, latex, morphine and related, other, pyridium [phenazopyridine hcl], tramadol, vioxx [rofecoxib], hepatitis b virus vaccines, naproxen, oxycodone-acetaminophen, sulfa antibiotics, and sulfasalazine.  MEDICATIONS:  Current Outpatient Medications  Medication Sig Dispense Refill   albuterol (VENTOLIN HFA) 108 (90 Base) MCG/ACT inhaler Inhale  1-2 puffs INH Q4-6hr prn for chest tightness, cough, wheezing, SOB/DOE. 18 g 0   clobetasol cream (TEMOVATE) 2.87 % Apply 1 application topically 2 (two) times daily. 30 g 0   cyanocobalamin (,VITAMIN B-12,) 1000 MCG/ML injection Inject into the muscle.     EPINEPHrine (ADRENALIN) 1 MG/ML SOLN injection See admin instructions.     EPINEPHrine (EPIPEN 2-PAK) 0.3 mg/0.3 mL IJ SOAJ injection Inject 0.3 mLs (0.3 mg total) into the muscle as needed for anaphylaxis. 1 each 1   erythromycin ophthalmic ointment SMARTSIG:In Eye(s)     furosemide (LASIX) 20 MG tablet Take by mouth.     levocetirizine (XYZAL) 5 MG tablet Take 5 mg by mouth daily.     montelukast (SINGULAIR) 10 MG tablet Take 10 mg by mouth daily.      omeprazole (PRILOSEC) 40 MG capsule Take 1 capsule (40 mg total) by mouth in the morning and at bedtime. 180 capsule 3   Vitamin D, Ergocalciferol, (DRISDOL) 1.25 MG (50000 UT) CAPS capsule Take 50,000 Units by mouth once a week.     No current facility-administered medications for this visit.   Facility-Administered Medications Ordered in Other Visits  Medication Dose Route Frequency Provider Last Rate Last Admin   0.9 %  sodium chloride infusion   Intravenous Once Cammie Sickle, MD       cyanocobalamin ((VITAMIN B-12)) injection 1,000 mcg  1,000 mcg Intramuscular Once  Cammie Sickle, MD       iron sucrose (VENOFER) injection 200 mg  200 mg Intravenous Once Cammie Sickle, MD          .  PHYSICAL EXAMINATION:  Vitals:   07/05/21 1333  BP: 117/85  Pulse: 72  Temp: (!) 97.3 F (36.3 C)  SpO2: 99%    Filed Weights   07/05/21 1333  Weight: 206 lb 9.6 oz (93.7 kg)     Physical Exam HENT:     Head: Normocephalic and atraumatic.     Mouth/Throat:     Pharynx: No oropharyngeal exudate.  Eyes:     Pupils: Pupils are equal, round, and reactive to light.  Cardiovascular:     Rate and Rhythm: Normal rate and regular rhythm.  Pulmonary:     Effort: No  respiratory distress.     Breath sounds: No wheezing.  Abdominal:     General: Bowel sounds are normal. There is no distension.     Palpations: Abdomen is soft. There is no mass.     Tenderness: There is no abdominal tenderness. There is no guarding or rebound.  Musculoskeletal:        General: No tenderness. Normal range of motion.     Cervical back: Normal range of motion and neck supple.  Skin:    General: Skin is warm.  Neurological:     Mental Status: She is alert and oriented to person, place, and time.  Psychiatric:        Mood and Affect: Affect normal.      LABORATORY DATA:  I have reviewed the data as listed Lab Results  Component Value Date   WBC 6.2 07/05/2021   HGB 13.3 07/05/2021   HCT 39.7 07/05/2021   MCV 91.1 07/05/2021   PLT 300 07/05/2021   Recent Labs    07/27/20 0805 07/05/21 1253  NA 135 138  K 3.9 3.9  CL 108 107  CO2 22 22  GLUCOSE 89 103*  BUN 13 12  CREATININE 0.73 0.76  CALCIUM 8.7* 8.7*  GFRNONAA >60 >60  PROT 7.6  --   ALBUMIN 3.6  --   AST 21  --   ALT 19  --   ALKPHOS 75  --   BILITOT 0.4  --     RADIOGRAPHIC STUDIES: I have personally reviewed the radiological images as listed and agreed with the findings in the report. No results found.   ASSESSMENT & PLAN:   Iron deficiency # Iron def/ B12 def- gastric bypass/-B12 deficiency on B12 injection-July 2022-ferritin 6/iron deficiency-s/p IV iron infusion tolerated well.  Improved energy levels.  Proceed with maintenance B12/iron infusions every 3 to 4 months.  # Left lower extremity swelling- Lymphedema [s/p Vascular]- lymphedema pump.   # RA- poorly controlled- refer to Dr.Patel [s/p arthro- sec to RA]-stable.  # Left ear middle infection itch- s/p cirpofloxacin; defer to Dr.Sparks/ENT-   # DISPOSITION: # venofer/ B12 today # Follow up MD 4 months- labs- cbc/bmp;possible venofer; B12 injection-dr.B  All questions were answered. The patient knows to call the clinic with  any problems, questions or concerns.   Cammie Sickle, MD 07/05/2021 2:11 PM

## 2021-07-05 NOTE — Progress Notes (Signed)
Can you look in her left ear, off antibiotic for ear from pcp, she states it itches and pcp said looks scratched.

## 2021-09-01 ENCOUNTER — Other Ambulatory Visit: Payer: Self-pay | Admitting: Obstetrics and Gynecology

## 2021-09-01 DIAGNOSIS — E559 Vitamin D deficiency, unspecified: Secondary | ICD-10-CM | POA: Insufficient documentation

## 2021-09-08 ENCOUNTER — Encounter
Admission: RE | Admit: 2021-09-08 | Discharge: 2021-09-08 | Disposition: A | Discharge status: Home / Self Care | Payer: BC Managed Care – PPO | Source: Ambulatory Visit | Attending: Obstetrics and Gynecology | Admitting: Obstetrics and Gynecology

## 2021-09-08 VITALS — Ht 61.5 in | Wt 203.0 lb

## 2021-09-08 DIAGNOSIS — Z01818 Encounter for other preprocedural examination: Secondary | ICD-10-CM

## 2021-09-08 HISTORY — DX: Personal history of other diseases of the digestive system: Z87.19

## 2021-09-08 HISTORY — DX: Venous insufficiency (chronic) (peripheral): I87.2

## 2021-09-08 HISTORY — DX: Sleep apnea, unspecified: G47.30

## 2021-09-08 HISTORY — DX: Anemia, unspecified: D64.9

## 2021-09-08 HISTORY — DX: Gastro-esophageal reflux disease without esophagitis: K21.9

## 2021-09-08 HISTORY — DX: Headache, unspecified: R51.9

## 2021-09-08 HISTORY — DX: Raised antibody titer: R76.0

## 2021-09-08 HISTORY — DX: Lymphedema, not elsewhere classified: I89.0

## 2021-09-08 HISTORY — DX: Personal history of urinary calculi: Z87.442

## 2021-09-08 HISTORY — DX: Deficiency of other specified B group vitamins: E53.8

## 2021-09-08 HISTORY — DX: Iron deficiency: E61.1

## 2021-09-08 HISTORY — DX: Obesity, unspecified: E66.9

## 2021-09-08 HISTORY — DX: Pneumonia, unspecified organism: J18.9

## 2021-09-08 NOTE — Patient Instructions (Addendum)
Your procedure is scheduled on:09-17-21 Friday Report to the Registration Desk on the 1st floor of the [facility].Then proceed to the 2nd floor Surgery Desk To find out your arrival time, please call [phone] between 1PM - 3PM on:09-16-21 Thursday If your arrival time is 6:00 am, do not arrive prior to that time as the [facility] entrance doors do not open until 6:00 am.  REMEMBER: Instructions that are not followed completely may result in serious medical risk, up to and including death; or upon the discretion of your surgeon and anesthesiologist your surgery may need to be rescheduled.  Do not eat food after midnight the night before surgery.  No gum chewing, lozengers or hard candies.  You may however, drink CLEAR liquids up to 2 hours before you are scheduled to arrive for your surgery. Do not drink anything within 2 hours of your scheduled arrival time.  Clear liquids include: - water  - apple juice without pulp - gatorade (not RED colors) - black coffee or tea (Do NOT add milk or creamers to the coffee or tea) Do NOT drink anything that is not on this list.  In addition, your doctor has ordered for you to drink the provided  Ensure Pre-Surgery Clear Carbohydrate Drink  Drinking this carbohydrate drink up to two hours before surgery helps to reduce insulin resistance and improve patient outcomes. Please complete drinking 2 hours prior to scheduled arrival time.  TAKE THESE MEDICATIONS THE MORNING OF SURGERY WITH A SIP OF WATER: -levocetirizine (XYZAL)  -omeprazole (PRILOSEC)-take one the night before and one on the morning of surgery - helps to prevent nausea after surgery.)  Use your Albuterol Inhaler the day of surgery and bring your Albuterol Inhaler to the hospital  Stop your Phentermine NOW (09-08-21)-You may resume after your surgery  One week prior to surgery: Stop Anti-inflammatories (NSAIDS) such as Advil, Aleve, Ibuprofen, Motrin, Naproxen, Naprosyn and Aspirin  based products such as Excedrin, Goodys Powder, BC Powder.You may however, take Tylenol if needed for pain up until the day of surgery.  Stop ANY OVER THE COUNTER supplements/vitamins NOW (09-08-21) until after surgery-Continue your Potassium as usual  No Alcohol for 24 hours before or after surgery.  No Smoking including e-cigarettes for 24 hours prior to surgery.  No chewable tobacco products for at least 6 hours prior to surgery.  No nicotine patches on the day of surgery.  Do not use any "recreational" drugs for at least a week prior to your surgery.  Please be advised that the combination of cocaine and anesthesia may have negative outcomes, up to and including death. If you test positive for cocaine, your surgery will be cancelled.  On the morning of surgery brush your teeth with toothpaste and water, you may rinse your mouth with mouthwash if you wish. Do not swallow any toothpaste or mouthwash.  Do not wear jewelry, make-up, hairpins, clips or nail polish.  Do not wear lotions, powders, or perfumes.   Do not shave body from the neck down 48 hours prior to surgery just in case you cut yourself which could leave a site for infection.  Also, freshly shaved skin may become irritated if using the CHG soap.  Contact lenses, hearing aids and dentures may not be worn into surgery.  Do not bring valuables to the hospital. [facility] is not responsible for any missing/lost belongings or valuables.   Notify your doctor if there is any change in your medical condition (cold, fever, infection).  Wear  comfortable clothing (specific to your surgery type) to the hospital.  After surgery, you can help prevent lung complications by doing breathing exercises.  Take deep breaths and cough every 1-2 hours. Your doctor may order a device called an Incentive Spirometer to help you take deep breaths. When coughing or sneezing, hold a pillow firmly against your incision with both hands. This is  called "splinting." Doing this helps protect your incision. It also decreases belly discomfort.  If you are being admitted to the hospital overnight, leave your suitcase in the car. After surgery it may be brought to your room.  If you are being discharged the day of surgery, you will not be allowed to drive home. You will need a responsible adult (18 years or older) to drive you home and stay with you that night.   If you are taking public transportation, you will need to have a responsible adult (18 years or older) with you. Please confirm with your physician that it is acceptable to use public transportation.   Please call the [facility] Dept. at [phone] if you have any questions about these instructions.  Surgery Visitation Policy:  Patients undergoing a surgery or procedure may have two family members or support persons with them as long as the person is not COVID-19 positive or experiencing its symptoms.

## 2021-09-10 ENCOUNTER — Encounter
Admission: RE | Admit: 2021-09-10 | Discharge: 2021-09-10 | Disposition: A | Discharge status: Home / Self Care | Payer: BC Managed Care – PPO | Source: Ambulatory Visit | Attending: Obstetrics and Gynecology | Admitting: Obstetrics and Gynecology

## 2021-09-10 DIAGNOSIS — N946 Dysmenorrhea, unspecified: Secondary | ICD-10-CM

## 2021-09-10 DIAGNOSIS — N920 Excessive and frequent menstruation with regular cycle: Secondary | ICD-10-CM | POA: Diagnosis not present

## 2021-09-10 DIAGNOSIS — Y828 Other medical devices associated with adverse incidents: Secondary | ICD-10-CM | POA: Diagnosis not present

## 2021-09-10 DIAGNOSIS — Z01812 Encounter for preprocedural laboratory examination: Secondary | ICD-10-CM | POA: Diagnosis present

## 2021-09-10 DIAGNOSIS — T8332XA Displacement of intrauterine contraceptive device, initial encounter: Secondary | ICD-10-CM | POA: Insufficient documentation

## 2021-09-10 LAB — TYPE AND SCREEN
ABO/RH(D): A POS
Antibody Screen: NEGATIVE

## 2021-09-16 MED ORDER — CHLORHEXIDINE GLUCONATE 0.12 % MT SOLN: 15.0000 mL | Freq: Once | OROMUCOSAL | Status: AC

## 2021-09-16 MED ORDER — LACTATED RINGERS IV SOLN
INTRAVENOUS | Status: DC
Start: 2021-09-16 — End: 2021-09-17

## 2021-09-16 MED ORDER — ORAL CARE MOUTH RINSE: 15.0000 mL | Freq: Once | OROMUCOSAL | Status: AC

## 2021-09-16 MED ORDER — LACTATED RINGERS IV SOLN: INTRAVENOUS | Status: DC

## 2021-09-17 ENCOUNTER — Encounter: Payer: Self-pay | Admitting: Obstetrics and Gynecology

## 2021-09-17 ENCOUNTER — Ambulatory Visit: Payer: BC Managed Care – PPO | Admitting: Certified Registered Nurse Anesthetist

## 2021-09-17 ENCOUNTER — Ambulatory Visit
Admission: RE | Admit: 2021-09-17 | Discharge: 2021-09-17 | Disposition: A | Discharge status: Home / Self Care | Payer: BC Managed Care – PPO | Attending: Obstetrics and Gynecology | Admitting: Obstetrics and Gynecology

## 2021-09-17 ENCOUNTER — Encounter
Admission: RE | Disposition: A | Discharge status: Home / Self Care | Payer: Self-pay | Source: Home / Self Care | Attending: Obstetrics and Gynecology

## 2021-09-17 ENCOUNTER — Other Ambulatory Visit: Payer: Self-pay

## 2021-09-17 DIAGNOSIS — Z01818 Encounter for other preprocedural examination: Secondary | ICD-10-CM

## 2021-09-17 DIAGNOSIS — N920 Excessive and frequent menstruation with regular cycle: Secondary | ICD-10-CM | POA: Diagnosis present

## 2021-09-17 DIAGNOSIS — T8339XA Other mechanical complication of intrauterine contraceptive device, initial encounter: Secondary | ICD-10-CM | POA: Insufficient documentation

## 2021-09-17 DIAGNOSIS — N946 Dysmenorrhea, unspecified: Secondary | ICD-10-CM

## 2021-09-17 DIAGNOSIS — T8332XA Displacement of intrauterine contraceptive device, initial encounter: Secondary | ICD-10-CM

## 2021-09-17 HISTORY — PX: IUD REMOVAL: SHX5392

## 2021-09-17 HISTORY — PX: HYSTEROSCOPY WITH D & C: SHX1775

## 2021-09-17 HISTORY — PX: INTRAUTERINE DEVICE (IUD) INSERTION: SHX5877

## 2021-09-17 SURGERY — DILATATION AND CURETTAGE /HYSTEROSCOPY
Anesthesia: General | Site: Uterus

## 2021-09-17 MED ORDER — SILVER NITRATE-POT NITRATE 75-25 % EX MISC
CUTANEOUS | Status: DC | PRN
  Administered 2021-09-17: 4

## 2021-09-17 MED ORDER — FENTANYL CITRATE (PF) 100 MCG/2ML IJ SOLN
INTRAMUSCULAR | Status: AC
  Filled 2021-09-17: qty 2

## 2021-09-17 MED ORDER — FENTANYL CITRATE (PF) 100 MCG/2ML IJ SOLN
INTRAMUSCULAR | Status: AC
  Administered 2021-09-17: 25 ug via INTRAVENOUS
  Filled 2021-09-17: qty 2

## 2021-09-17 MED ORDER — LEVONORGESTREL 20 MCG/DAY IU IUD
1.0000 | INTRAUTERINE_SYSTEM | INTRAUTERINE | Status: AC
  Administered 2021-09-17: 1 via INTRAUTERINE

## 2021-09-17 MED ORDER — ACETAMINOPHEN 10 MG/ML IV SOLN
INTRAVENOUS | Status: AC
  Filled 2021-09-17: qty 100

## 2021-09-17 MED ORDER — MIDAZOLAM HCL 2 MG/2ML IJ SOLN
INTRAMUSCULAR | Status: DC | PRN
  Administered 2021-09-17: 2 mg via INTRAVENOUS

## 2021-09-17 MED ORDER — ACETAMINOPHEN 10 MG/ML IV SOLN
INTRAVENOUS | Status: DC | PRN
  Administered 2021-09-17: 1000 mg via INTRAVENOUS

## 2021-09-17 MED ORDER — ONDANSETRON HCL 4 MG/2ML IJ SOLN
INTRAMUSCULAR | Status: DC | PRN
  Administered 2021-09-17: 4 mg via INTRAVENOUS

## 2021-09-17 MED ORDER — CHLORHEXIDINE GLUCONATE 0.12 % MT SOLN
OROMUCOSAL | Status: AC
  Administered 2021-09-17: 15 mL via OROMUCOSAL
  Filled 2021-09-17: qty 15

## 2021-09-17 MED ORDER — SILVER NITRATE-POT NITRATE 75-25 % EX MISC
CUTANEOUS | Status: AC
  Filled 2021-09-17: qty 10

## 2021-09-17 MED ORDER — DEXAMETHASONE SODIUM PHOSPHATE 10 MG/ML IJ SOLN
INTRAMUSCULAR | Status: DC | PRN
  Administered 2021-09-17: 5 mg via INTRAVENOUS

## 2021-09-17 MED ORDER — FENTANYL CITRATE (PF) 100 MCG/2ML IJ SOLN
INTRAMUSCULAR | Status: DC | PRN
  Administered 2021-09-17: 25 ug via INTRAVENOUS

## 2021-09-17 MED ORDER — FENTANYL CITRATE (PF) 100 MCG/2ML IJ SOLN
25.0000 ug | INTRAMUSCULAR | Status: AC | PRN
  Administered 2021-09-17 (×4): 25 ug via INTRAVENOUS

## 2021-09-17 MED ORDER — LIDOCAINE HCL (CARDIAC) PF 100 MG/5ML IV SOSY
PREFILLED_SYRINGE | INTRAVENOUS | Status: DC | PRN
  Administered 2021-09-17: 100 mg via INTRAVENOUS

## 2021-09-17 MED ORDER — PROPOFOL 10 MG/ML IV BOLUS
INTRAVENOUS | Status: DC | PRN
  Administered 2021-09-17: 150 mg via INTRAVENOUS

## 2021-09-17 MED ORDER — DIPHENHYDRAMINE HCL 50 MG/ML IJ SOLN
INTRAMUSCULAR | Status: AC
  Filled 2021-09-17: qty 1

## 2021-09-17 MED ORDER — MIDAZOLAM HCL 2 MG/2ML IJ SOLN
INTRAMUSCULAR | Status: AC
  Filled 2021-09-17: qty 2

## 2021-09-17 MED ORDER — DIPHENHYDRAMINE HCL 50 MG/ML IJ SOLN
25.0000 mg | Freq: Once | INTRAMUSCULAR | Status: AC
Start: 2021-09-17 — End: 2021-09-17
  Administered 2021-09-17: 25 mg via INTRAVENOUS

## 2021-09-17 MED ORDER — ONDANSETRON HCL 4 MG/2ML IJ SOLN: 4.0000 mg | Freq: Once | INTRAMUSCULAR | Status: DC | PRN

## 2021-09-17 SURGICAL SUPPLY — 34 items
BAG DRN RND TRDRP ANRFLXCHMBR (UROLOGICAL SUPPLIES)
BAG URINE DRAIN 2000ML AR STRL (UROLOGICAL SUPPLIES) IMPLANT
CATH FOLEY 2WAY  5CC 16FR (CATHETERS)
CATH FOLEY 2WAY 5CC 16FR (CATHETERS)
CATH URTH 16FR FL 2W BLN LF (CATHETERS) IMPLANT
DEVICE MYOSURE LITE (MISCELLANEOUS) ×1 IMPLANT
DEVICE MYOSURE REACH (MISCELLANEOUS) ×1 IMPLANT
DRSG TELFA 3X8 NADH STRL (GAUZE/BANDAGES/DRESSINGS) IMPLANT
ELECT REM PT RETURN 9FT ADLT (ELECTROSURGICAL) ×1
ELECTRODE REM PT RTRN 9FT ADLT (ELECTROSURGICAL) ×1 IMPLANT
GLOVE BIO SURGEON STRL SZ7 (GLOVE) ×1 IMPLANT
GLOVE BIO SURGEON STRL SZ7.5 (GLOVE) ×1 IMPLANT
GLOVE BIOGEL PI IND STRL 7.5 (GLOVE) ×1 IMPLANT
GLOVE BIOGEL PI INDICATOR 7.5 (GLOVE) ×1
GOWN STRL REUS W/ TWL LRG LVL3 (GOWN DISPOSABLE) ×2 IMPLANT
GOWN STRL REUS W/TWL LRG LVL3 (GOWN DISPOSABLE) ×2
IV NS IRRIG 3000ML ARTHROMATIC (IV SOLUTION) ×1 IMPLANT
KIT PROCEDURE FLUENT (KITS) ×1 IMPLANT
KIT TURNOVER CYSTO (KITS) ×1 IMPLANT
MANIFOLD NEPTUNE II (INSTRUMENTS) ×1 IMPLANT
Mirena IUD IMPLANT
PACK DNC HYST (MISCELLANEOUS) ×1 IMPLANT
PAD OB MATERNITY 4.3X12.25 (PERSONAL CARE ITEMS) ×1 IMPLANT
PAD PREP 24X41 OB/GYN DISP (PERSONAL CARE ITEMS) ×1 IMPLANT
SCRUB CHG 4% DYNA-HEX 4OZ (MISCELLANEOUS) ×1 IMPLANT
SEAL ROD LENS SCOPE MYOSURE (ABLATOR) ×1 IMPLANT
SET CYSTO W/LG BORE CLAMP LF (SET/KITS/TRAYS/PACK) IMPLANT
SURGILUBE 2OZ TUBE FLIPTOP (MISCELLANEOUS) ×1 IMPLANT
SUT VIC AB 0 CT1 36 (SUTURE) IMPLANT
SYR 10ML LL (SYRINGE) IMPLANT
TOWEL OR 17X26 4PK STRL BLUE (TOWEL DISPOSABLE) ×1 IMPLANT
TRAP FLUID SMOKE EVACUATOR (MISCELLANEOUS) ×1 IMPLANT
TUBING CONNECTING 10 (TUBING) ×1 IMPLANT
WATER STERILE IRR 500ML POUR (IV SOLUTION) ×1 IMPLANT

## 2021-09-17 NOTE — Op Note (Signed)
  Operative Note    Name: Debbie Sosa  Date of Service: 09/17/2021  DOB: August 25, 1975  MRN: [id number]   Pre-Operative Diagnosis:  1) Menorrhagia with regular cycle [N92.0] 2) Retained intrauterine device [T83.39XA]  Post-Operative Diagnosis:  1) Menorrhagia with regular cycle [N92.0] 2) Retained intrauterine device [T83.39XA]  Procedures:  1) Hysteroscopy 2) Retrieval of intrauterine device  3) Placement of Mirena intrauterine device   Primary Surgeon: Prentice Docker, MD   EBL: 20 mL   IVF: 700 mL   Urine output: 10 mL  Specimens: none  Drains: none  Complications: None   Disposition: PACU   Condition: Stable   Findings:  1) Intrauterine device located within uterine cavity with strings near internal cervical os.   2) No evidence of the intrauterine device embedded in the uterine cavity 3) Normal appearing uterine cavity  PROCEDURE IN DETAIL:  After informed consent was obtained, the patient was taken to the operating room where anesthesia was obtained without difficulty. The patient was positioned in the dorsal lithotomy position in [place].  The patient's bladder was catheterized with an in and out foley catheter.  The patient was examined under anesthesia, with the above noted findings.  The bi-valved speculum was placed inside the patient's vagina, and the the anterior lip of the cervix was grasped with the tenaculum.  The cervix was progressively dilated to a 7 mm Hegar dilator.  An attempt to remove the IUD using sponge packing forceps was unsuccessful. Randall-Stone forceps were also attempted without success.  So, the hysteroscope was introduced, with the above noted findings.  Through the operative port, the urology retrieval graspers were used to grasp the strings of the intrauterine device  The intrauterine device was removed in its entirety and the uterus was drained of all fluid.    Intrauterine device placed per manufacturer's recommendations.   Strings trimmed to 3 cm. Tenaculum was removed, hemostasis noted after placing a figure-of-eight stitch on the cervix on the right and application of silver nitrate to the tenaculum entry site on the left.    Excellent hemostasis was noted, and all instruments were removed.    The patient tolerated the procedure well.  Sponge, lap, needle, and instrument counts were correct x 2.  VTE prophylaxis: SCDs. Antibiotic prophylaxis: none indicated and none given. She was awakened in the operating room and was taken to the PACU in stable condition.   Prentice Docker, MD 09/17/2021 10:18 AM

## 2021-09-17 NOTE — Anesthesia Preprocedure Evaluation (Signed)
Anesthesia Evaluation  Patient identified by MRN, date of birth, ID band Patient awake    Reviewed: Allergy & Precautions, H&P , NPO status , Patient's Chart, lab work & pertinent test results, reviewed documented beta blocker date and time   History of Anesthesia Complications Negative for: history of anesthetic complications  Airway Mallampati: II  TM Distance: >3 FB Neck ROM: full    Dental  (+) Poor Dentition, Chipped, Dental Advidsory Given   Pulmonary shortness of breath and with exertion, asthma , pneumonia, resolved, neg COPD, neg recent URI,    Pulmonary exam normal        Cardiovascular Exercise Tolerance: Good (-) angina(-) Past MI negative cardio ROS Normal cardiovascular exam     Neuro/Psych negative neurological ROS  negative psych ROS   GI/Hepatic Neg liver ROS, hiatal hernia, GERD  Controlled,  Endo/Other  negative endocrine ROS  Renal/GU negative Renal ROS  negative genitourinary   Musculoskeletal  (+) Arthritis ,   Abdominal   Peds  Hematology negative hematology ROS (+)   Anesthesia Other Findings Past Medical History: No date: Asthma No date: Pulmonary embolism [facility]) Past Surgical History: 2006: BREAST BIOPSY; Left     Comment:  benign/clip 2015: BREAST BIOPSY; Right     Comment:  benign/clip No date: BREAST REDUCTION SURGERY No date: CESAREAN SECTION No date: CESAREAN SECTION No date: CHOLECYSTECTOMY No date: COLONOSCOPY 10/31/2018: COLONOSCOPY WITH PROPOFOL; N/A     Comment:  Procedure: COLONOSCOPY WITH PROPOFOL;  Surgeon: [name], MD;  Location: [facility] ENDOSCOPY;  Service:               Gastroenterology;  Laterality: N/A; 10/04/2016: ESOPHAGOGASTRODUODENOSCOPY (EGD) WITH PROPOFOL; N/A     Comment:  Procedure: ESOPHAGOGASTRODUODENOSCOPY (EGD) WITH               PROPOFOL;  Surgeon: [name], MD;  Location: [facility]               ENDOSCOPY;  Service: Gastroenterology;   Laterality: N/A; 04/14/2017: ESOPHAGOGASTRODUODENOSCOPY (EGD) WITH PROPOFOL; N/A     Comment:  Procedure: ESOPHAGOGASTRODUODENOSCOPY (EGD) WITH               PROPOFOL;  Surgeon: [name], MD;  Location: [facility]               ENDOSCOPY;  Service: Gastroenterology;  Laterality: N/A; 10/31/2018: ESOPHAGOGASTRODUODENOSCOPY (EGD) WITH PROPOFOL; N/A     Comment:  Procedure: ESOPHAGOGASTRODUODENOSCOPY (EGD) WITH               PROPOFOL;  Surgeon: [name], MD;  Location: [facility]               ENDOSCOPY;  Service: Gastroenterology;  Laterality: N/A; No date: GASTRIC BYPASS     Comment:  01/2015 No date: KNEE SURGERY No date: NASAL SINUS SURGERY No date: PLANTAR FASCIA SURGERY     Comment:  x 3 2005: REDUCTION MAMMAPLASTY; Bilateral   Reproductive/Obstetrics negative OB ROS                             Anesthesia Physical  Anesthesia Plan  ASA: 3  Anesthesia Plan: General   Post-op Pain Management:    Induction: Intravenous  PONV Risk Score and Plan: 3 and Ondansetron, Dexamethasone, Midazolam and Treatment may vary due to age or medical condition  Airway Management Planned: LMA  Additional Equipment:  Intra-op Plan:   Post-operative Plan: Extubation in OR  Informed Consent: I have reviewed the patients History and Physical, chart, labs and discussed the procedure including the risks, benefits and alternatives for the proposed anesthesia with the patient or authorized representative who has indicated his/her understanding and acceptance.     Dental Advisory Given  Plan Discussed with: CRNA  Anesthesia Plan Comments: (Patient consented for risks of anesthesia including but not limited to:  - adverse reactions to medications - risk of airway placement if required - damage to eyes, teeth, lips or other oral mucosa - nerve damage due to positioning  - sore throat or hoarseness - Damage to heart, brain, nerves, lungs, other parts of body or loss of  life  Patient voiced understanding.)        Anesthesia Quick Evaluation

## 2021-09-17 NOTE — Anesthesia Procedure Notes (Signed)
Procedure Name: LMA Insertion Date/Time: 09/17/2021 9:39 AM  Performed by: Lowry Bowl, CRNAPre-anesthesia Checklist: Patient identified, Emergency Drugs available, Suction available and Patient being monitored Patient Re-evaluated:Patient Re-evaluated prior to induction Oxygen Delivery Method: Circle system utilized Preoxygenation: Pre-oxygenation with 100% oxygen Induction Type: IV induction Ventilation: Mask ventilation without difficulty LMA: LMA inserted LMA Size: 4.0 Number of attempts: 1 Placement Confirmation: breath sounds checked- equal and bilateral and positive ETCO2 Tube secured with: Tape Dental Injury: Teeth and Oropharynx as per pre-operative assessment

## 2021-09-17 NOTE — Transfer of Care (Signed)
Immediate Anesthesia Transfer of Care Note  Patient: Debbie Sosa  Procedure(s) Performed: DILATATION AND CURETTAGE /HYSTEROSCOPY INTRAUTERINE DEVICE (IUD) REMOVAL INTRAUTERINE DEVICE (IUD) INSERTION - MIRENA  Patient Location: PACU  Anesthesia Type:General  Level of Consciousness: awake, drowsy and patient cooperative  Airway & Oxygen Therapy: Patient Spontanous Breathing  Post-op Assessment: Report given to RN and Post -op Vital signs reviewed and stable  Post vital signs: Reviewed and stable  Last Vitals:  Vitals Value Taken Time  BP 104/78 09/17/21 1027  Temp 36.4 C 09/17/21 1027  Pulse 77 09/17/21 1030  Resp 20 09/17/21 1030  SpO2 96 % 09/17/21 1030  Vitals shown include unvalidated device data.  Last Pain:  Vitals:   09/17/21 1027  TempSrc:   PainSc: Asleep         Complications: No notable events documented.

## 2021-09-17 NOTE — Discharge Instructions (Signed)

## 2021-09-17 NOTE — H&P (Signed)
Preoperative History and Physical  Debbie Sosa is a 46 y.o.  here for surgical management of Menorrhagia with regular cycle and retained intrauterine device .   No significant preoperative concerns.  History of Present Illness: 46 y.o. female who presents to discuss treatmetn for heavy and painful periods. She has had a Mirena for multiple years. Her most recent one for the past 4 years. She is parous x 2. She has a history of PE with her second pregnancy. She has a history of using Depo Provera and due to a bone density scan showing low bone density she stopped taking that medication and now uses the Mirena IUD. Her periods are just heavy enough where she has to use a pad and she is allergic to most pads. She gets some bleeding with her IUD once a month, last 3-4 days. Yesterday was the heaviest bleeding she has seen in a while.   She also has symptoms that go along with her periods: cramping, not feeling well. She is having some sweating at night.  She states that she has a history of fibroids. Her mother is a breast cancer survivor. She is up to date on her pap smears. Her most recent one was 04/08/21 and was NILM, HPV negative. She has a history of c-section x 2. She no longer desires to have other children.   06/28/2021: Pelvic ultrasound Ultrasound demonstrates the following findings Adnexa: no masses seen  Uterus: retroverted with endometrial stripe 3.6 mm Additional: IUD located correctly  Her last period was about 06/16/2021. The bleeding was enough for her to have to wear a panty liner.   She had a failed in-office procedure after multiple attempts to remove the IUD.  She would like to have the IUD replaced.  Proposed surgery: hysteroscopy, intrauterine device removal and replacement.  Past Medical History:  Diagnosis Date   Anemia    Antiphospholipid antibody positive    Asthma    Chronic venous insufficiency    COVID-19 2020   DDD (degenerative disc disease), lumbar     GERD (gastroesophageal reflux disease)    Headache    h/o migraines   History of hiatal hernia    History of kidney stones    Iron deficiency    recieves iron infusions every 3 months at the cancer center   Lymphedema    bil legs-sees [place] [facility]   Obesity    Pneumonia    Pulmonary embolism ([place])    after 2nd c-section   Sleep apnea    had gastric bypass and does not need to use cpap since surgery   Vitamin B12 deficiency    Past Surgical History:  Procedure Laterality Date   BREAST BIOPSY Left 2006   benign/clip   BREAST BIOPSY Right 2015   benign/clip   CESAREAN SECTION     CESAREAN SECTION     CHOLECYSTECTOMY     COLONOSCOPY     COLONOSCOPY WITH PROPOFOL N/A 10/31/2018   Procedure: COLONOSCOPY WITH PROPOFOL;  Surgeon: [name], MD;  Location: [facility] ENDOSCOPY;  Service: Gastroenterology;  Laterality: N/A;   ESOPHAGOGASTRODUODENOSCOPY (EGD) WITH PROPOFOL N/A 10/04/2016   Procedure: ESOPHAGOGASTRODUODENOSCOPY (EGD) WITH PROPOFOL;  Surgeon: [name], MD;  Location: [facility] ENDOSCOPY;  Service: Gastroenterology;  Laterality: N/A;   ESOPHAGOGASTRODUODENOSCOPY (EGD) WITH PROPOFOL N/A 04/14/2017   Procedure: ESOPHAGOGASTRODUODENOSCOPY (EGD) WITH PROPOFOL;  Surgeon: [name], MD;  Location: [facility] ENDOSCOPY;  Service: Gastroenterology;  Laterality: N/A;   ESOPHAGOGASTRODUODENOSCOPY (EGD) WITH PROPOFOL N/A 10/31/2018   Procedure:  ESOPHAGOGASTRODUODENOSCOPY (EGD) WITH PROPOFOL;  Surgeon: [name], MD;  Location: [facility] ENDOSCOPY;  Service: Gastroenterology;  Laterality: N/A;   ESOPHAGOGASTRODUODENOSCOPY (EGD) WITH PROPOFOL N/A 06/28/2019   Procedure: ESOPHAGOGASTRODUODENOSCOPY (EGD) WITH PROPOFOL;  Surgeon: [name], MD;  Location: [facility] ENDOSCOPY;  Service: Gastroenterology;  Laterality: N/A;   ESOPHAGOGASTRODUODENOSCOPY (EGD) WITH PROPOFOL N/A 02/10/2021   Procedure: ESOPHAGOGASTRODUODENOSCOPY (EGD) WITH PROPOFOL;  Surgeon: [name], MD;  Location: [facility] ENDOSCOPY;  Service:  Gastroenterology;  Laterality: N/A;   GASTRIC BYPASS     01/2015   KNEE SURGERY Right    NASAL SINUS SURGERY     PLANTAR FASCIA SURGERY     x 3   REDUCTION MAMMAPLASTY Bilateral 2005   OB History  No obstetric history on file.  Patient denies any other pertinent gynecologic issues.   No current facility-administered medications on file prior to encounter.   Current Outpatient Medications on File Prior to Encounter  Medication Sig Dispense Refill   albuterol (VENTOLIN HFA) 108 (90 Base) MCG/ACT inhaler Inhale 1-2 puffs INH Q4-6hr prn for chest tightness, cough, wheezing, SOB/DOE. 18 g 0   levocetirizine (XYZAL) 5 MG tablet Take 5 mg by mouth every morning.     montelukast (SINGULAIR) 10 MG tablet Take 10 mg by mouth as needed.     omeprazole (PRILOSEC) 40 MG capsule Take 1 capsule (40 mg total) by mouth in the morning and at bedtime. (Patient taking differently: Take 40 mg by mouth every morning.) 180 capsule 3   Vitamin D, Ergocalciferol, (DRISDOL) 1.25 MG (50000 UT) CAPS capsule Take 50,000 Units by mouth once a week. Mondays     cyanocobalamin (,VITAMIN B-12,) 1000 MCG/ML injection Inject 1,000 mcg into the muscle once a week.     EPINEPHrine (EPIPEN 2-PAK) 0.3 mg/0.3 mL IJ SOAJ injection Inject 0.3 mLs (0.3 mg total) into the muscle as needed for anaphylaxis. 1 each 1   [DISCONTINUED] pantoprazole (PROTONIX) 40 MG tablet Take 1 tablet (40 mg total) by mouth daily. 30 tablet 5   Allergies  Allergen Reactions   Avocado Anaphylaxis   Cephalexin Hives and Rash   Fexofenadine Shortness Of Breath   Iodinated Contrast Media Hives   Ketorolac Hives   Morphine Anaphylaxis   Promethazine Hives   Diflucan [Fluconazole] Rash   Cetirizine Other (See Comments)    Skin eruptures   Diclofenac Sodium Hives   Latex     With condoms   Other Hives    Pain medicine that starts with an L   Tramadol Hives   Hepatitis B Virus Vaccines Rash   Morphine And Related Rash   Naproxen Rash    Oxycodone-Acetaminophen Itching   Pyridium [Phenazopyridine Hcl] Rash   Sulfa Antibiotics Rash   Sulfasalazine Rash   Vioxx [Rofecoxib] Rash    Social History:   reports that she has never smoked. She has never used smokeless tobacco. She reports that she does not drink alcohol and does not use drugs.  Family History  Problem Relation Age of Onset   Breast cancer Mother 68   Breast cancer Cousin 32   Breast cancer Cousin 49   Asthma Father    Asthma Sister    Asthma Child     Review of Systems: Noncontributory  PHYSICAL EXAM: Blood pressure 109/79, pulse 71, temperature (!) 96.9 F (36.1 C), temperature source Temporal, resp. rate 18, height '5\' 1"'$  (1.549 m), weight 92.1 kg, last menstrual period 08/17/2021, SpO2 99 %. CONSTITUTIONAL: Well-developed, well-nourished female in no acute distress.  HENT:  Normocephalic, atraumatic, External right and left ear normal. Oropharynx is clear and moist EYES: Conjunctivae and EOM are normal. Pupils are equal, round, and reactive to light. No scleral icterus.  NECK: Normal range of motion, supple, no masses SKIN: Skin is warm and dry. No rash noted. Not diaphoretic. No erythema. No pallor. [place]: Alert and oriented to person, place, and time. Normal reflexes, muscle tone coordination. No cranial nerve deficit noted. PSYCHIATRIC: Normal mood and affect. Normal behavior. Normal judgment and thought content. CARDIOVASCULAR: Normal heart rate noted, regular rhythm RESPIRATORY: Effort and breath sounds normal, no problems with respiration noted ABDOMEN: Soft, nontender, nondistended. PELVIC: Deferred MUSCULOSKELETAL: Normal range of motion. No edema and no tenderness. 2+ distal pulses.  Labs: Results for orders placed or performed during the hospital encounter of 09/10/21 (from the past 336 hour(s))  Type and screen [place]   Collection Time: 09/10/21  3:13 PM  Result Value Ref Range   ABO/RH(D) A POS    Antibody  Screen NEG    Sample Expiration 09/24/2021,2359    Extend sample reason      NO TRANSFUSIONS OR PREGNANCY IN THE PAST 3 MONTHS Performed at [facility], [address] [place], [place] [address]     Imaging Studies: No results found.  Assessment: Menorrhagia with regular cycle Retained intrauterine device   Plan: Patient will undergo surgical management with the above procedure.   The risks of surgery were discussed in detail with the patient including but not limited to: bleeding which may require transfusion or reoperation; infection which may require antibiotics; injury to surrounding organs which may involve bowel, bladder, ureters ; need for additional procedures including laparoscopy or laparotomy; thromboembolic phenomenon, surgical site problems and other postoperative/anesthesia complications. Likelihood of success in alleviating the patient's condition was discussed. Routine postoperative instructions will be reviewed with the patient and her family in detail after surgery.  The patient concurred with the proposed plan, giving informed written consent for the surgery.  Patient has been NPO since last night she will remain NPO for procedure.  Anesthesia and OR aware.  Preoperative prophylactic antibiotics, as indicated, and SCDs ordered on call to the OR.  To OR when ready.  Prentice Docker, MD 09/17/2021 9:03 AM

## 2021-09-22 NOTE — Anesthesia Postprocedure Evaluation (Signed)
Anesthesia Post Note  Patient: Debbie Sosa  Procedure(s) Performed: DILATATION AND CURETTAGE /HYSTEROSCOPY (Uterus) INTRAUTERINE DEVICE (IUD) REMOVAL (Uterus) INTRAUTERINE DEVICE (IUD) INSERTION - MIRENA (Uterus)  Patient location during evaluation: PACU Anesthesia Type: General Level of consciousness: awake and alert Pain management: pain level controlled Vital Signs Assessment: post-procedure vital signs reviewed and stable Respiratory status: spontaneous breathing, nonlabored ventilation, respiratory function stable and patient connected to nasal cannula oxygen Cardiovascular status: blood pressure returned to baseline and stable Postop Assessment: no apparent nausea or vomiting Anesthetic complications: no   No notable events documented.   Last Vitals:  Vitals:   09/17/21 1210 09/17/21 1221  BP: 120/77 101/80  Pulse: 79 69  Resp: (!) 34 17  Temp:  (!) 36.1 C  SpO2: 100% 98%    Last Pain:  Vitals:   09/17/21 1221  TempSrc: Temporal  PainSc: 0-No pain                 Martha Clan

## 2021-09-24 ENCOUNTER — Encounter (INDEPENDENT_AMBULATORY_CARE_PROVIDER_SITE_OTHER): Payer: Self-pay | Admitting: Nurse Practitioner

## 2021-09-24 ENCOUNTER — Ambulatory Visit (INDEPENDENT_AMBULATORY_CARE_PROVIDER_SITE_OTHER): Payer: BC Managed Care – PPO | Admitting: Nurse Practitioner

## 2021-09-24 VITALS — BP 109/72 | HR 85 | Resp 18 | Ht 61.5 in | Wt 200.0 lb

## 2021-09-24 DIAGNOSIS — M79605 Pain in left leg: Secondary | ICD-10-CM | POA: Diagnosis not present

## 2021-09-24 DIAGNOSIS — I890 Lymphedema, not elsewhere classified: Secondary | ICD-10-CM

## 2021-09-24 DIAGNOSIS — M79604 Pain in right leg: Secondary | ICD-10-CM

## 2021-09-27 ENCOUNTER — Other Ambulatory Visit (INDEPENDENT_AMBULATORY_CARE_PROVIDER_SITE_OTHER): Payer: Self-pay | Admitting: Nurse Practitioner

## 2021-09-27 DIAGNOSIS — I890 Lymphedema, not elsewhere classified: Secondary | ICD-10-CM

## 2021-09-27 DIAGNOSIS — M79605 Pain in left leg: Secondary | ICD-10-CM

## 2021-09-28 ENCOUNTER — Encounter (INDEPENDENT_AMBULATORY_CARE_PROVIDER_SITE_OTHER): Payer: Self-pay | Admitting: Nurse Practitioner

## 2021-09-28 ENCOUNTER — Ambulatory Visit (INDEPENDENT_AMBULATORY_CARE_PROVIDER_SITE_OTHER): Payer: BC Managed Care – PPO | Admitting: Nurse Practitioner

## 2021-09-28 ENCOUNTER — Ambulatory Visit (INDEPENDENT_AMBULATORY_CARE_PROVIDER_SITE_OTHER): Payer: BC Managed Care – PPO

## 2021-09-28 DIAGNOSIS — M79604 Pain in right leg: Secondary | ICD-10-CM | POA: Diagnosis not present

## 2021-09-28 DIAGNOSIS — I890 Lymphedema, not elsewhere classified: Secondary | ICD-10-CM | POA: Diagnosis not present

## 2021-09-28 DIAGNOSIS — M79605 Pain in left leg: Secondary | ICD-10-CM

## 2021-10-10 ENCOUNTER — Encounter (INDEPENDENT_AMBULATORY_CARE_PROVIDER_SITE_OTHER): Payer: Self-pay | Admitting: Nurse Practitioner

## 2021-10-10 NOTE — Progress Notes (Signed)
Subjective:    Patient ID: SHITAL CRAYTON, female    DOB: 1975/12/01, 46 y.o.   MRN: [id number] No chief complaint on file.   Vikki Gains is a 46 year old female who returns today for noninvasive studies to evaluate cause of left lower extremity pain.  She notes that she has bilateral leg pain in the left femur to the right.  The pain is primarily in her calf.  She notes that the only thing that seemed to help was when she was given torsemide by her primary care provider.  The issue however was that he gave her severe leg cramps.  Based on this she does not want to try this again which is understandable.  Since that time the patient does continue to have lower extremity edema however is well controlled today.  Today noninvasive studies show right ABI of 1.15 and a left of 1.17.  There are triphasic tibial artery waveforms with normal toe waveforms bilaterally.    Review of Systems     Objective:   Physical Exam  BP 115/76 (BP Location: Right Arm)   Pulse 80   Resp 18   Ht '5\' 1"'$  (1.549 m)   Wt 201 lb (91.2 kg)   LMP 08/17/2021 (Approximate)   BMI 37.98 kg/m   Past Medical History:  Diagnosis Date   Anemia    Antiphospholipid antibody positive    Asthma    Chronic venous insufficiency    COVID-19 2020   DDD (degenerative disc disease), lumbar    GERD (gastroesophageal reflux disease)    Headache    h/o migraines   History of hiatal hernia    History of kidney stones    Iron deficiency    recieves iron infusions every 3 months at the cancer center   Lymphedema    bil legs-sees [place] V&V   Obesity    Pneumonia    Pulmonary embolism (HCC)    after 2nd c-section   Sleep apnea    had gastric bypass and does not need to use cpap since surgery   Vitamin B12 deficiency     Social History   Socioeconomic History   Marital status: Single    Spouse name: Not on file   Number of children: Not on file   Years of education: Not on file   Highest education level:  Not on file  Occupational History   Not on file  Tobacco Use   Smoking status: Never   Smokeless tobacco: Never  Vaping Use   Vaping Use: Never used  Substance and Sexual Activity   Alcohol use: No   Drug use: No   Sexual activity: Not on file  Other Topics Concern   Not on file  Social History Narrative   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    Social Determinants of Health   Financial Resource Strain: Not on file  Food Insecurity: Not on file  Transportation Needs: Not on file  Physical Activity: Not on file  Stress: Not on file  Social Connections: Not on file  Intimate Partner Violence: Not on file    Past Surgical History:  Procedure Laterality Date   BREAST BIOPSY Left 2006   benign/clip   BREAST BIOPSY Right 2015   benign/clip   CESAREAN SECTION     CESAREAN SECTION     CHOLECYSTECTOMY     COLONOSCOPY     COLONOSCOPY WITH PROPOFOL N/A 10/31/2018  Procedure: COLONOSCOPY WITH PROPOFOL;  Surgeon: [name], MD;  Location: [facility] ENDOSCOPY;  Service: Gastroenterology;  Laterality: N/A;   ESOPHAGOGASTRODUODENOSCOPY (EGD) WITH PROPOFOL N/A 10/04/2016   Procedure: ESOPHAGOGASTRODUODENOSCOPY (EGD) WITH PROPOFOL;  Surgeon: [name], MD;  Location: [facility] ENDOSCOPY;  Service: Gastroenterology;  Laterality: N/A;   ESOPHAGOGASTRODUODENOSCOPY (EGD) WITH PROPOFOL N/A 04/14/2017   Procedure: ESOPHAGOGASTRODUODENOSCOPY (EGD) WITH PROPOFOL;  Surgeon: [name], MD;  Location: [facility] ENDOSCOPY;  Service: Gastroenterology;  Laterality: N/A;   ESOPHAGOGASTRODUODENOSCOPY (EGD) WITH PROPOFOL N/A 10/31/2018   Procedure: ESOPHAGOGASTRODUODENOSCOPY (EGD) WITH PROPOFOL;  Surgeon: [name], MD;  Location: [facility] ENDOSCOPY;  Service: Gastroenterology;  Laterality: N/A;   ESOPHAGOGASTRODUODENOSCOPY (EGD) WITH PROPOFOL N/A 06/28/2019   Procedure: ESOPHAGOGASTRODUODENOSCOPY (EGD) WITH PROPOFOL;  Surgeon:  [name], MD;  Location: [facility] ENDOSCOPY;  Service: Gastroenterology;  Laterality: N/A;   ESOPHAGOGASTRODUODENOSCOPY (EGD) WITH PROPOFOL N/A 02/10/2021   Procedure: ESOPHAGOGASTRODUODENOSCOPY (EGD) WITH PROPOFOL;  Surgeon: [name], MD;  Location: [facility] ENDOSCOPY;  Service: Gastroenterology;  Laterality: N/A;   GASTRIC BYPASS     01/2015   HYSTEROSCOPY WITH D & C N/A 09/17/2021   Procedure: DILATATION AND CURETTAGE /HYSTEROSCOPY;  Surgeon: Will Bonnet, MD;  Location: [facility] ORS;  Service: Gynecology;  Laterality: N/A;   INTRAUTERINE DEVICE (IUD) INSERTION N/A 09/17/2021   Procedure: INTRAUTERINE DEVICE (IUD) INSERTION - MIRENA;  Surgeon: Will Bonnet, MD;  Location: [facility] ORS;  Service: Gynecology;  Laterality: N/A;   IUD REMOVAL N/A 09/17/2021   Procedure: INTRAUTERINE DEVICE (IUD) REMOVAL;  Surgeon: Will Bonnet, MD;  Location: [facility] ORS;  Service: Gynecology;  Laterality: N/A;   KNEE SURGERY Right    NASAL SINUS SURGERY     PLANTAR FASCIA SURGERY     x 3   REDUCTION MAMMAPLASTY Bilateral 2005    Family History  Problem Relation Age of Onset   Breast cancer Mother 45   Breast cancer Cousin 92   Breast cancer Cousin 101   Asthma Father    Asthma Sister    Asthma Child     Allergies  Allergen Reactions   Avocado Anaphylaxis   Cephalexin Hives and Rash   Fexofenadine Shortness Of Breath   Iodinated Contrast Media Hives   Ketorolac Hives   Morphine Anaphylaxis   Promethazine Hives   Diflucan [Fluconazole] Rash   Cetirizine Other (See Comments)    Skin eruptures   Diclofenac Sodium Hives   Latex     With condoms   Other Hives    Pain medicine that starts with an L   Tramadol Hives   Hepatitis B Virus Vaccines Rash   Morphine And Related Rash   Naproxen Rash   Oxycodone-Acetaminophen Itching   Pyridium [Phenazopyridine Hcl] Rash   Sulfa Antibiotics Rash   Sulfasalazine Rash   Vioxx [Rofecoxib] Rash       Latest Ref Rng & Units 07/05/2021   12:53 PM  03/08/2021   12:49 PM 11/09/2020   12:46 PM  CBC  WBC 4.0 - 10.5 K/uL 6.2  4.9  3.4   Hemoglobin 12.0 - 15.0 g/dL 13.3  13.7  12.5   Hematocrit 36.0 - 46.0 % 39.7  41.4  37.7   Platelets 150 - 400 K/uL 300  314  292       CMP     Component Value Date/Time   NA 138 07/05/2021 1253   NA 140 08/23/2013 1720   K 3.9 07/05/2021 1253   K 3.7 08/23/2013 1720   CL 107 07/05/2021 1253   CL  105 08/23/2013 1720   CO2 22 07/05/2021 1253   CO2 25 08/23/2013 1720   GLUCOSE 103 (H) 07/05/2021 1253   GLUCOSE 86 08/23/2013 1720   BUN 12 07/05/2021 1253   BUN 7 08/23/2013 1720   CREATININE 0.76 07/05/2021 1253   CREATININE 0.84 08/23/2013 1720   CALCIUM 8.7 (L) 07/05/2021 1253   CALCIUM 8.6 08/23/2013 1720   PROT 7.6 07/27/2020 0805   PROT 7.9 08/23/2013 1720   ALBUMIN 3.6 07/27/2020 0805   ALBUMIN 3.4 08/23/2013 1720   AST 21 07/27/2020 0805   AST 23 08/23/2013 1720   ALT 19 07/27/2020 0805   ALT 24 08/23/2013 1720   ALKPHOS 75 07/27/2020 0805   ALKPHOS 99 08/23/2013 1720   BILITOT 0.4 07/27/2020 0805   BILITOT 0.2 08/23/2013 1720   GFRNONAA >60 07/05/2021 1253   GFRNONAA >60 08/23/2013 1720   GFRAA >60 01/17/2019 0604   GFRAA >60 08/23/2013 1720     VAS [place] ABI WITH/WO TBI  Result Date: 10/05/2021  LOWER EXTREMITY DOPPLER STUDY Patient Name:  SHAQUANTA HARKLESS  Date of Exam:   09/28/2021 Medical Rec #: [id number]          Accession #:    [id number] Date of Birth: 1975-03-20           Patient Gender: F Patient Age:   65 years Exam Location:  [facility] Procedure:      VAS [place] ABI WITH/WO TBI Referring Phys: --------------------------------------------------------------------------------  Indications: bilat leg pain Lt > rt  Performing Technologist: Concha Norway RVT  Examination Guidelines: A complete evaluation includes at minimum, Doppler waveform signals and systolic blood pressure reading at the level of bilateral brachial, anterior tibial, and posterior tibial arteries,  when vessel segments are accessible. Bilateral testing is considered an integral part of a complete examination. Photoelectric Plethysmograph (PPG) waveforms and toe systolic pressure readings are included as required and additional duplex testing as needed. Limited examinations for reoccurring indications may be performed as noted.  ABI Findings: +---------+------------------+-----+---------+--------+ Right    Rt Pressure (mmHg)IndexWaveform Comment  +---------+------------------+-----+---------+--------+ Brachial 110                                      +---------+------------------+-----+---------+--------+ ATA      126               1.15 triphasic         +---------+------------------+-----+---------+--------+ PTA      124               1.13 triphasic         +---------+------------------+-----+---------+--------+ [facility]               1.03 Normal            +---------+------------------+-----+---------+--------+ +---------+------------------+-----+---------+-------+ Left     Lt Pressure (mmHg)IndexWaveform Comment +---------+------------------+-----+---------+-------+ Brachial 110                                     +---------+------------------+-----+---------+-------+ ATA      128               1.16 triphasic        +---------+------------------+-----+---------+-------+ PTA      129               1.17 triphasic        +---------+------------------+-----+---------+-------+  Great [id number]               1.02 Normal           +---------+------------------+-----+---------+-------+  Summary: Right: Resting right ankle-brachial index is within normal range. The right toe-brachial index is normal. Left: Resting left ankle-brachial index is within normal range. The left toe-brachial index is normal. *See table(s) above for measurements and observations.  Electronically signed by Leotis Pain MD on 10/05/2021 at 1:12:10 PM.    Final        Assessment &  Plan:   1. Pain in both lower extremities The pain in the patient's lower extremities is atypical for vascular disease.  I suspect that it is neuropathic in nature.  The patient notes she has had studies done by neurology before but I am unable to find any lower extremity study records.  The patient is advised to continue to follow with PCP as well as to repeat follow-up with neurology for possible etiology of pain in the calf. - VAS [place] ABI WITH/WO TBI  2. Lymphedema Patient's leg swelling is under good control with compression and lymphedema pump.  Patient to continue these conservative tactics.  We will have the patient return in 6 months. - VAS [place] ABI WITH/WO TBI   Current Outpatient Medications on File Prior to Visit  Medication Sig Dispense Refill   albuterol (VENTOLIN HFA) 108 (90 Base) MCG/ACT inhaler Inhale 1-2 puffs INH Q4-6hr prn for chest tightness, cough, wheezing, SOB/DOE. 18 g 0   cyanocobalamin (,VITAMIN B-12,) 1000 MCG/ML injection Inject 1,000 mcg into the muscle once a week.     EPINEPHrine (EPIPEN 2-PAK) 0.3 mg/0.3 mL IJ SOAJ injection Inject 0.3 mLs (0.3 mg total) into the muscle as needed for anaphylaxis. 1 each 1   levocetirizine (XYZAL) 5 MG tablet Take 5 mg by mouth every morning.     montelukast (SINGULAIR) 10 MG tablet Take 10 mg by mouth as needed.     omeprazole (PRILOSEC) 40 MG capsule Take 1 capsule (40 mg total) by mouth every morning.     Vitamin D, Ergocalciferol, (DRISDOL) 1.25 MG (50000 UT) CAPS capsule Take 50,000 Units by mouth once a week. Mondays     phentermine 37.5 MG capsule Take 37.5 mg by mouth every morning. (Patient not taking: Reported on 09/24/2021)     potassium chloride SA (KLOR-CON M) 20 MEQ tablet Take 20 mEq by mouth daily. (Patient not taking: Reported on 09/24/2021)     torsemide (DEMADEX) 20 MG tablet Take 20 mg by mouth at bedtime. (Patient not taking: Reported on 09/24/2021)     [DISCONTINUED] pantoprazole (PROTONIX) 40 MG tablet Take 1  tablet (40 mg total) by mouth daily. 30 tablet 5   No current facility-administered medications on file prior to visit.    There are no Patient Instructions on file for this visit. No follow-ups on file.   Kris Hartmann, NP

## 2021-10-14 ENCOUNTER — Encounter (INDEPENDENT_AMBULATORY_CARE_PROVIDER_SITE_OTHER): Payer: Self-pay | Admitting: Nurse Practitioner

## 2021-10-14 NOTE — Progress Notes (Signed)
Subjective:    Patient ID: Debbie Sosa, female    DOB: Nov 10, 1975, 46 y.o.   MRN: [id number] No chief complaint on file.   Debbie Sosa is a 46 year old female who returns today for evaluation of her lymphedema.  The patient's leg swelling is improved but she notes pain in her right lower extremity.  She notes that the only thing that seemed to resolve the pain was furosemide given to her by her primary care provider.  She notes that the pain is primarily in her calf area.  Previous noninvasive studies show no evidence of DVT or superficial phlebitis or no venous insufficiency but the patient has not had any arterial evaluations.      Review of Systems  Cardiovascular:  Positive for leg swelling.  All other systems reviewed and are negative.      Objective:   Physical Exam Vitals reviewed.  Cardiovascular:     Rate and Rhythm: Normal rate.  Pulmonary:     Effort: Pulmonary effort is normal.  Skin:    General: Skin is warm and dry.  Neurological:     Mental Status: She is alert and oriented to person, place, and time.  Psychiatric:        Mood and Affect: Mood normal.        Behavior: Behavior normal.        Thought Content: Thought content normal.        Judgment: Judgment normal.     BP 109/72 (BP Location: Right Arm)   Pulse 85   Resp 18   Ht 5' 1.5" (1.562 m)   Wt 200 lb (90.7 kg)   LMP 08/17/2021 (Approximate)   BMI 37.18 kg/m   Past Medical History:  Diagnosis Date   Anemia    Antiphospholipid antibody positive    Asthma    Chronic venous insufficiency    COVID-19 2020   DDD (degenerative disc disease), lumbar    GERD (gastroesophageal reflux disease)    Headache    h/o migraines   History of hiatal hernia    History of kidney stones    Iron deficiency    recieves iron infusions every 3 months at the cancer center   Lymphedema    bil legs-sees [place] V&V   Obesity    Pneumonia    Pulmonary embolism (HCC)    after 2nd c-section    Sleep apnea    had gastric bypass and does not need to use cpap since surgery   Vitamin B12 deficiency     Social History   Socioeconomic History   Marital status: Single    Spouse name: Not on file   Number of children: Not on file   Years of education: Not on file   Highest education level: Not on file  Occupational History   Not on file  Tobacco Use   Smoking status: Never   Smokeless tobacco: Never  Vaping Use   Vaping Use: Never used  Substance and Sexual Activity   Alcohol use: No   Drug use: No   Sexual activity: Not on file  Other Topics Concern   Not on file  Social History Narrative   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    Social Determinants of Health   Financial Resource Strain: Not on file  Food Insecurity: Not on file  Transportation Needs: Not on file  Physical Activity: Not on file  Stress:  Not on file  Social Connections: Not on file  Intimate Partner Violence: Not on file    Past Surgical History:  Procedure Laterality Date   BREAST BIOPSY Left 2006   benign/clip   BREAST BIOPSY Right 2015   benign/clip   CESAREAN SECTION     CESAREAN SECTION     CHOLECYSTECTOMY     COLONOSCOPY     COLONOSCOPY WITH PROPOFOL N/A 10/31/2018   Procedure: COLONOSCOPY WITH PROPOFOL;  Surgeon: [name], MD;  Location: [facility] ENDOSCOPY;  Service: Gastroenterology;  Laterality: N/A;   ESOPHAGOGASTRODUODENOSCOPY (EGD) WITH PROPOFOL N/A 10/04/2016   Procedure: ESOPHAGOGASTRODUODENOSCOPY (EGD) WITH PROPOFOL;  Surgeon: [name], MD;  Location: [facility] ENDOSCOPY;  Service: Gastroenterology;  Laterality: N/A;   ESOPHAGOGASTRODUODENOSCOPY (EGD) WITH PROPOFOL N/A 04/14/2017   Procedure: ESOPHAGOGASTRODUODENOSCOPY (EGD) WITH PROPOFOL;  Surgeon: [name], MD;  Location: [facility] ENDOSCOPY;  Service: Gastroenterology;  Laterality: N/A;   ESOPHAGOGASTRODUODENOSCOPY (EGD) WITH PROPOFOL N/A  10/31/2018   Procedure: ESOPHAGOGASTRODUODENOSCOPY (EGD) WITH PROPOFOL;  Surgeon: [name], MD;  Location: [facility] ENDOSCOPY;  Service: Gastroenterology;  Laterality: N/A;   ESOPHAGOGASTRODUODENOSCOPY (EGD) WITH PROPOFOL N/A 06/28/2019   Procedure: ESOPHAGOGASTRODUODENOSCOPY (EGD) WITH PROPOFOL;  Surgeon: [name], MD;  Location: [facility] ENDOSCOPY;  Service: Gastroenterology;  Laterality: N/A;   ESOPHAGOGASTRODUODENOSCOPY (EGD) WITH PROPOFOL N/A 02/10/2021   Procedure: ESOPHAGOGASTRODUODENOSCOPY (EGD) WITH PROPOFOL;  Surgeon: [name], MD;  Location: [facility] ENDOSCOPY;  Service: Gastroenterology;  Laterality: N/A;   GASTRIC BYPASS     01/2015   HYSTEROSCOPY WITH D & C N/A 09/17/2021   Procedure: DILATATION AND CURETTAGE /HYSTEROSCOPY;  Surgeon: Will Bonnet, MD;  Location: [facility] ORS;  Service: Gynecology;  Laterality: N/A;   INTRAUTERINE DEVICE (IUD) INSERTION N/A 09/17/2021   Procedure: INTRAUTERINE DEVICE (IUD) INSERTION - MIRENA;  Surgeon: Will Bonnet, MD;  Location: [facility] ORS;  Service: Gynecology;  Laterality: N/A;   IUD REMOVAL N/A 09/17/2021   Procedure: INTRAUTERINE DEVICE (IUD) REMOVAL;  Surgeon: Will Bonnet, MD;  Location: [facility] ORS;  Service: Gynecology;  Laterality: N/A;   KNEE SURGERY Right    NASAL SINUS SURGERY     PLANTAR FASCIA SURGERY     x 3   REDUCTION MAMMAPLASTY Bilateral 2005    Family History  Problem Relation Age of Onset   Breast cancer Mother 44   Breast cancer Cousin 43   Breast cancer Cousin 42   Asthma Father    Asthma Sister    Asthma Child     Allergies  Allergen Reactions   Avocado Anaphylaxis   Cephalexin Hives and Rash   Fexofenadine Shortness Of Breath   Iodinated Contrast Media Hives   Ketorolac Hives   Morphine Anaphylaxis   Promethazine Hives   Diflucan [Fluconazole] Rash   Cetirizine Other (See Comments)    Skin eruptures   Diclofenac Sodium Hives   Latex     With condoms   Other Hives    Pain medicine that starts with  an L   Tramadol Hives   Hepatitis B Virus Vaccines Rash   Morphine And Related Rash   Naproxen Rash   Oxycodone-Acetaminophen Itching   Pyridium [Phenazopyridine Hcl] Rash   Sulfa Antibiotics Rash   Sulfasalazine Rash   Vioxx [Rofecoxib] Rash       Latest Ref Rng & Units 07/05/2021   12:53 PM 03/08/2021   12:49 PM 11/09/2020   12:46 PM  CBC  WBC 4.0 - 10.5 K/uL 6.2  4.9  3.4   Hemoglobin 12.0 - 15.0 g/dL 13.3  13.7  12.5   Hematocrit 36.0 - 46.0 % 39.7  41.4  37.7   Platelets 150 - 400 K/uL 300  314  292       CMP     Component Value Date/Time   NA 138 07/05/2021 1253   NA 140 08/23/2013 1720   K 3.9 07/05/2021 1253   K 3.7 08/23/2013 1720   CL 107 07/05/2021 1253   CL 105 08/23/2013 1720   CO2 22 07/05/2021 1253   CO2 25 08/23/2013 1720   GLUCOSE 103 (H) 07/05/2021 1253   GLUCOSE 86 08/23/2013 1720   BUN 12 07/05/2021 1253   BUN 7 08/23/2013 1720   CREATININE 0.76 07/05/2021 1253   CREATININE 0.84 08/23/2013 1720   CALCIUM 8.7 (L) 07/05/2021 1253   CALCIUM 8.6 08/23/2013 1720   PROT 7.6 07/27/2020 0805   PROT 7.9 08/23/2013 1720   ALBUMIN 3.6 07/27/2020 0805   ALBUMIN 3.4 08/23/2013 1720   AST 21 07/27/2020 0805   AST 23 08/23/2013 1720   ALT 19 07/27/2020 0805   ALT 24 08/23/2013 1720   ALKPHOS 75 07/27/2020 0805   ALKPHOS 99 08/23/2013 1720   BILITOT 0.4 07/27/2020 0805   BILITOT 0.2 08/23/2013 1720   GFRNONAA >60 07/05/2021 1253   GFRNONAA >60 08/23/2013 1720   GFRAA >60 01/17/2019 0604   GFRAA >60 08/23/2013 1720     VAS [place] ABI WITH/WO TBI  Result Date: 10/05/2021  LOWER EXTREMITY DOPPLER STUDY Patient Name:  Debbie Sosa  Date of Exam:   09/28/2021 Medical Rec #: [id number]          Accession #:    [id number] Date of Birth: December 28, 1975           Patient Gender: F Patient Age:   84 years Exam Location:  [facility] Procedure:      VAS [place] ABI WITH/WO TBI Referring Phys:  --------------------------------------------------------------------------------  Indications: bilat leg pain Lt > rt  Performing Technologist: Concha Norway RVT  Examination Guidelines: A complete evaluation includes at minimum, Doppler waveform signals and systolic blood pressure reading at the level of bilateral brachial, anterior tibial, and posterior tibial arteries, when vessel segments are accessible. Bilateral testing is considered an integral part of a complete examination. Photoelectric Plethysmograph (PPG) waveforms and toe systolic pressure readings are included as required and additional duplex testing as needed. Limited examinations for reoccurring indications may be performed as noted.  ABI Findings: +---------+------------------+-----+---------+--------+ Right    Rt Pressure (mmHg)IndexWaveform Comment  +---------+------------------+-----+---------+--------+ Brachial 110                                      +---------+------------------+-----+---------+--------+ ATA      126               1.15 triphasic         +---------+------------------+-----+---------+--------+ PTA      124               1.13 triphasic         +---------+------------------+-----+---------+--------+ Theodoro Parma               1.03 Normal            +---------+------------------+-----+---------+--------+ +---------+------------------+-----+---------+-------+ Left     Lt Pressure (mmHg)IndexWaveform Comment +---------+------------------+-----+---------+-------+ Brachial 110                                     +---------+------------------+-----+---------+-------+  ATA      128               1.16 triphasic        +---------+------------------+-----+---------+-------+ PTA      129               1.17 triphasic        +---------+------------------+-----+---------+-------+ Ellen Henri               1.02 Normal           +---------+------------------+-----+---------+-------+   Summary: Right: Resting right ankle-brachial index is within normal range. The right toe-brachial index is normal. Left: Resting left ankle-brachial index is within normal range. The left toe-brachial index is normal. *See table(s) above for measurements and observations.  Electronically signed by Leotis Pain MD on 10/05/2021 at 1:12:10 PM.    Final        Assessment & Plan:   1. Lymphedema Lymphedema appears to be well controlled.  Patient should continue with use of medical grade compression, elevation and activity.    2. Bilateral leg pain We will have the patient return for ABIs to determine if this is an arterial component.  The patient symptoms are not quite typical for venous insufficiency or arterial disease.  We will discuss neck steps post noninvasive studies.   Current Outpatient Medications on File Prior to Visit  Medication Sig Dispense Refill   albuterol (VENTOLIN HFA) 108 (90 Base) MCG/ACT inhaler Inhale 1-2 puffs INH Q4-6hr prn for chest tightness, cough, wheezing, SOB/DOE. 18 g 0   cyanocobalamin (,VITAMIN B-12,) 1000 MCG/ML injection Inject 1,000 mcg into the muscle once a week.     EPINEPHrine (EPIPEN 2-PAK) 0.3 mg/0.3 mL IJ SOAJ injection Inject 0.3 mLs (0.3 mg total) into the muscle as needed for anaphylaxis. 1 each 1   levocetirizine (XYZAL) 5 MG tablet Take 5 mg by mouth every morning.     montelukast (SINGULAIR) 10 MG tablet Take 10 mg by mouth as needed.     omeprazole (PRILOSEC) 40 MG capsule Take 1 capsule (40 mg total) by mouth every morning.     Vitamin D, Ergocalciferol, (DRISDOL) 1.25 MG (50000 UT) CAPS capsule Take 50,000 Units by mouth once a week. Mondays     phentermine 37.5 MG capsule Take 37.5 mg by mouth every morning. (Patient not taking: Reported on 09/24/2021)     potassium chloride SA (KLOR-CON M) 20 MEQ tablet Take 20 mEq by mouth daily. (Patient not taking: Reported on 09/24/2021)     torsemide (DEMADEX) 20 MG tablet Take 20 mg by mouth at bedtime.  (Patient not taking: Reported on 09/24/2021)     [DISCONTINUED] pantoprazole (PROTONIX) 40 MG tablet Take 1 tablet (40 mg total) by mouth daily. 30 tablet 5   No current facility-administered medications on file prior to visit.    There are no Patient Instructions on file for this visit. No follow-ups on file.   Kris Hartmann, NP

## 2021-11-01 DIAGNOSIS — M19072 Primary osteoarthritis, left ankle and foot: Secondary | ICD-10-CM | POA: Insufficient documentation

## 2021-11-03 ENCOUNTER — Inpatient Hospital Stay: Payer: BC Managed Care – PPO

## 2021-11-03 ENCOUNTER — Encounter: Payer: Self-pay | Admitting: Internal Medicine

## 2021-11-03 ENCOUNTER — Inpatient Hospital Stay (HOSPITAL_BASED_OUTPATIENT_CLINIC_OR_DEPARTMENT_OTHER): Payer: BC Managed Care – PPO | Admitting: Internal Medicine

## 2021-11-03 ENCOUNTER — Inpatient Hospital Stay: Payer: BC Managed Care – PPO | Attending: Internal Medicine

## 2021-11-03 VITALS — BP 117/90 | HR 77 | Resp 18

## 2021-11-03 DIAGNOSIS — E611 Iron deficiency: Secondary | ICD-10-CM

## 2021-11-03 DIAGNOSIS — E538 Deficiency of other specified B group vitamins: Secondary | ICD-10-CM | POA: Diagnosis present

## 2021-11-03 DIAGNOSIS — M069 Rheumatoid arthritis, unspecified: Secondary | ICD-10-CM | POA: Diagnosis not present

## 2021-11-03 DIAGNOSIS — R5383 Other fatigue: Secondary | ICD-10-CM | POA: Diagnosis not present

## 2021-11-03 LAB — BASIC METABOLIC PANEL WITH GFR
Anion gap: 5 (ref 5–15)
BUN: 15 mg/dL (ref 6–20)
CO2: 23 mmol/L (ref 22–32)
Calcium: 8.5 mg/dL — ABNORMAL LOW (ref 8.9–10.3)
Chloride: 109 mmol/L (ref 98–111)
Creatinine, Ser: 0.71 mg/dL (ref 0.44–1.00)
GFR, Estimated: >60 mL/min
Glucose, Bld: 125 mg/dL — ABNORMAL HIGH (ref 70–99)
Potassium: 3.7 mmol/L (ref 3.5–5.1)
Sodium: 137 mmol/L (ref 135–145)

## 2021-11-03 LAB — CBC WITH DIFFERENTIAL/PLATELET
Abs Immature Granulocytes: 0.04 10*3/uL (ref 0.00–0.07)
Basophils Absolute: 0.1 10*3/uL (ref 0.0–0.1)
Basophils Relative: 1 %
Eosinophils Absolute: 0.3 10*3/uL (ref 0.0–0.5)
Eosinophils Relative: 5 %
HCT: 38.8 % (ref 36.0–46.0)
Hemoglobin: 12.9 g/dL (ref 12.0–15.0)
Immature Granulocytes: 1 %
Lymphocytes Relative: 27 %
Lymphs Abs: 1.7 10*3/uL (ref 0.7–4.0)
MCH: 30.7 pg (ref 26.0–34.0)
MCHC: 33.2 g/dL (ref 30.0–36.0)
MCV: 92.4 fL (ref 80.0–100.0)
Monocytes Absolute: 0.5 10*3/uL (ref 0.1–1.0)
Monocytes Relative: 7 %
Neutro Abs: 3.8 10*3/uL (ref 1.7–7.7)
Neutrophils Relative %: 59 %
Platelets: 285 10*3/uL (ref 150–400)
RBC: 4.2 MIL/uL (ref 3.87–5.11)
RDW: 13.3 % (ref 11.5–15.5)
WBC: 6.3 10*3/uL (ref 4.0–10.5)
nRBC: 0 % (ref 0.0–0.2)

## 2021-11-03 MED ORDER — CYANOCOBALAMIN 1000 MCG/ML IJ SOLN
1000.0000 ug | Freq: Once | INTRAMUSCULAR | Status: AC
  Administered 2021-11-03: 1000 ug via INTRAMUSCULAR
  Filled 2021-11-03: qty 1

## 2021-11-03 MED ORDER — SODIUM CHLORIDE 0.9 % IV SOLN
200.0000 mg | Freq: Once | INTRAVENOUS | Status: AC
  Administered 2021-11-03: 200 mg via INTRAVENOUS
  Filled 2021-11-03: qty 200

## 2021-11-03 MED ORDER — SODIUM CHLORIDE 0.9 % IV SOLN
Freq: Once | INTRAVENOUS | Status: AC
  Filled 2021-11-03: qty 250

## 2021-11-03 NOTE — Progress Notes (Signed)
Debbie Sosa NOTE  Patient Care Team: Idelle Crouch, MD as PCP - General (Internal Medicine) Cammie Sickle, MD as Consulting Physician (Oncology)  CHIEF COMPLAINTS/PURPOSE OF CONSULTATION: Iron/B12 deficiency  #Iron deficiency/B12 deficiency-gastric bypass; poor tolerance to p.o. iron.  # 2002- PE [2 weeks post partum] x18 months- coumadin; 2009- SVT- right chest wall- [[facility]]-short-term anticoagulation;  # ANTICRADIOLIPIN ANTIBODY[rheumatology evaluation; Dr.Patel];-08/2019--Pos: Anticardiolipin Ab IgM; Neg: Anticardiolipin Ab IgG, Beta-2Glyco 1 IgA, IgM, IgG, No lupus anticoagulant;   # Rheumatoid arthritis, stage 1; Diagnosed 08/2019; Positive RF; synovitis of the joint; elevated sed rate'l  Continue HCQ 200 mg BID  # Hx of gastric Bypass; hx of COVID infection admitted [Jan 2021]    Oncology History   No history exists.   HISTORY OF PRESENTING ILLNESS: Alone.  Ambulating independently.    Debbie Sosa 46 y.o.  female with iron deficiency/B12 deficiency gastric bypass is here for follow-up.   Worsening fatigue with excessive sleepiness.  Has received 2-3 weekly Vitamin B12 injections at PCP with last injection 09/2021.  Denies any blood in stools or black or stools.  Mild fatigue.  Not any worse.  Poor tolerance to oral iron.  Review of Systems  Constitutional:  Positive for malaise/fatigue. Negative for chills, diaphoresis, fever and weight loss.  HENT:  Negative for nosebleeds and sore throat.   Eyes:  Negative for double vision.  Respiratory:  Negative for cough, hemoptysis, sputum production, shortness of breath and wheezing.   Cardiovascular:  Negative for chest pain, palpitations, orthopnea and leg swelling.  Gastrointestinal:  Negative for abdominal pain, blood in stool, constipation, diarrhea, heartburn, melena, nausea and vomiting.  Genitourinary:  Negative for dysuria, frequency and urgency.  Musculoskeletal:  Positive for back  pain and joint pain.  Skin: Negative.  Negative for itching and rash.  Neurological:  Negative for dizziness, tingling, focal weakness, weakness and headaches.  Endo/Heme/Allergies:  Does not bruise/bleed easily.  Psychiatric/Behavioral:  Negative for depression. The patient is not nervous/anxious and does not have insomnia.      MEDICAL HISTORY:  Past Medical History:  Diagnosis Date   Anemia    Antiphospholipid antibody positive    Asthma    Chronic venous insufficiency    COVID-19 2020   DDD (degenerative disc disease), lumbar    GERD (gastroesophageal reflux disease)    Headache    h/o migraines   History of hiatal hernia    History of kidney stones    Iron deficiency    recieves iron infusions every 3 months at the cancer center   Lymphedema    bil legs-sees [place] [facility]   Obesity    Pneumonia    Pulmonary embolism ([place])    after 2nd c-section   Sleep apnea    had gastric bypass and does not need to use cpap since surgery   Vitamin B12 deficiency     SURGICAL HISTORY: Past Surgical History:  Procedure Laterality Date   BREAST BIOPSY Left 2006   benign/clip   BREAST BIOPSY Right 2015   benign/clip   CESAREAN SECTION     CESAREAN SECTION     CHOLECYSTECTOMY     COLONOSCOPY     COLONOSCOPY WITH PROPOFOL N/A 10/31/2018   Procedure: COLONOSCOPY WITH PROPOFOL;  Surgeon: [name], MD;  Location: [facility] ENDOSCOPY;  Service: Gastroenterology;  Laterality: N/A;   ESOPHAGOGASTRODUODENOSCOPY (EGD) WITH PROPOFOL N/A 10/04/2016   Procedure: ESOPHAGOGASTRODUODENOSCOPY (EGD) WITH PROPOFOL;  Surgeon: [name], MD;  Location: [facility] ENDOSCOPY;  Service:  Gastroenterology;  Laterality: N/A;   ESOPHAGOGASTRODUODENOSCOPY (EGD) WITH PROPOFOL N/A 04/14/2017   Procedure: ESOPHAGOGASTRODUODENOSCOPY (EGD) WITH PROPOFOL;  Surgeon: [name], MD;  Location: [facility] ENDOSCOPY;  Service: Gastroenterology;  Laterality: N/A;   ESOPHAGOGASTRODUODENOSCOPY (EGD) WITH PROPOFOL N/A 10/31/2018    Procedure: ESOPHAGOGASTRODUODENOSCOPY (EGD) WITH PROPOFOL;  Surgeon: [name], MD;  Location: [facility] ENDOSCOPY;  Service: Gastroenterology;  Laterality: N/A;   ESOPHAGOGASTRODUODENOSCOPY (EGD) WITH PROPOFOL N/A 06/28/2019   Procedure: ESOPHAGOGASTRODUODENOSCOPY (EGD) WITH PROPOFOL;  Surgeon: [name], MD;  Location: [facility] ENDOSCOPY;  Service: Gastroenterology;  Laterality: N/A;   ESOPHAGOGASTRODUODENOSCOPY (EGD) WITH PROPOFOL N/A 02/10/2021   Procedure: ESOPHAGOGASTRODUODENOSCOPY (EGD) WITH PROPOFOL;  Surgeon: [name], MD;  Location: [facility] ENDOSCOPY;  Service: Gastroenterology;  Laterality: N/A;   GASTRIC BYPASS     01/2015   HYSTEROSCOPY WITH D & C N/A 09/17/2021   Procedure: DILATATION AND CURETTAGE /HYSTEROSCOPY;  Surgeon: Will Bonnet, MD;  Location: [facility] ORS;  Service: Gynecology;  Laterality: N/A;   INTRAUTERINE DEVICE (IUD) INSERTION N/A 09/17/2021   Procedure: INTRAUTERINE DEVICE (IUD) INSERTION - MIRENA;  Surgeon: Will Bonnet, MD;  Location: [facility] ORS;  Service: Gynecology;  Laterality: N/A;   IUD REMOVAL N/A 09/17/2021   Procedure: INTRAUTERINE DEVICE (IUD) REMOVAL;  Surgeon: Will Bonnet, MD;  Location: [facility] ORS;  Service: Gynecology;  Laterality: N/A;   KNEE SURGERY Right    NASAL SINUS SURGERY     PLANTAR FASCIA SURGERY     x 3   REDUCTION MAMMAPLASTY Bilateral 2005    SOCIAL HISTORY: Social History   Socioeconomic History   Marital status: Single    Spouse name: Not on file   Number of children: Not on file   Years of education: Not on file   Highest education level: Not on file  Occupational History   Not on file  Tobacco Use   Smoking status: Never   Smokeless tobacco: Never  Vaping Use   Vaping Use: Never used  Substance and Sexual Activity   Alcohol use: No   Drug use: No   Sexual activity: Not on file  Other Topics Concern   Not on file  Social History Narrative   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    Social Determinants of Health   Financial Resource Strain: Not on file  Food Insecurity: Not on file  Transportation Needs: Not on file  Physical Activity: Not on file  Stress: Not on file  Social Connections: Not on file  Intimate Partner Violence: Not on file    FAMILY HISTORY: Family History  Problem Relation Age of Onset   Breast cancer Mother 20   Breast cancer Cousin 61   Breast cancer Cousin 59   Asthma Father    Asthma Sister    Asthma Child     ALLERGIES:  is allergic to avocado, cephalexin, fexofenadine, iodinated contrast media, ketorolac, morphine, promethazine, diflucan [fluconazole], cetirizine, diclofenac sodium, doxycycline, latex, other, tramadol, hepatitis b virus vaccines, misc. sulfonamide containing compounds, morphine and related, naproxen, oxycodone-acetaminophen, pyridium [phenazopyridine hcl], sulfa antibiotics, sulfasalazine, and vioxx [rofecoxib].  MEDICATIONS:  Current Outpatient Medications  Medication Sig Dispense Refill   albuterol (VENTOLIN HFA) 108 (90 Base) MCG/ACT inhaler Inhale 1-2 puffs INH Q4-6hr prn for chest tightness, cough, wheezing, SOB/DOE. 18 g 0   cyanocobalamin (,VITAMIN B-12,) 1000 MCG/ML injection Inject 1,000 mcg into the muscle once a week.     EPINEPHrine (EPIPEN 2-PAK) 0.3 mg/0.3 mL  IJ SOAJ injection Inject 0.3 mLs (0.3 mg total) into the muscle as needed for anaphylaxis. 1 each 1   levocetirizine (XYZAL) 5 MG tablet Take 5 mg by mouth every morning.     levonorgestrel (MIRENA) 20 MCG/DAY IUD by Intrauterine route.     montelukast (SINGULAIR) 10 MG tablet Take 10 mg by mouth as needed.     omeprazole (PRILOSEC) 40 MG capsule Take 1 capsule (40 mg total) by mouth every morning.     Vitamin D, Ergocalciferol, (DRISDOL) 1.25 MG (50000 UT) CAPS capsule Take 50,000 Units by mouth once a week. Mondays     phentermine 37.5 MG capsule Take 37.5 mg by mouth every morning. (Patient not  taking: Reported on 09/24/2021)     potassium chloride SA (KLOR-CON M) 20 MEQ tablet Take 20 mEq by mouth daily. (Patient not taking: Reported on 09/24/2021)     torsemide (DEMADEX) 20 MG tablet Take 20 mg by mouth at bedtime. (Patient not taking: Reported on 09/24/2021)     No current facility-administered medications for this visit.   Facility-Administered Medications Ordered in Other Visits  Medication Dose Route Frequency Provider Last Rate Last Admin   0.9 %  sodium chloride infusion   Intravenous Once Charlaine Dalton R, MD       cyanocobalamin (VITAMIN B12) injection 1,000 mcg  1,000 mcg Intramuscular Once Charlaine Dalton R, MD       iron sucrose (VENOFER) 200 mg in sodium chloride 0.9 % 100 mL IVPB  200 mg Intravenous Once Charlaine Dalton R, MD          .  PHYSICAL EXAMINATION:  Vitals:   11/03/21 1300  BP: (!) 145/98  Pulse: 79  Resp: 18  Temp: 98.6 F (37 C)    Filed Weights   11/03/21 1300  Weight: 200 lb 4.8 oz (90.9 kg)     Physical Exam HENT:     Head: Normocephalic and atraumatic.     Mouth/Throat:     Pharynx: No oropharyngeal exudate.  Eyes:     Pupils: Pupils are equal, round, and reactive to light.  Cardiovascular:     Rate and Rhythm: Normal rate and regular rhythm.  Pulmonary:     Effort: No respiratory distress.     Breath sounds: No wheezing.  Abdominal:     General: Bowel sounds are normal. There is no distension.     Palpations: Abdomen is soft. There is no mass.     Tenderness: There is no abdominal tenderness. There is no guarding or rebound.  Musculoskeletal:        General: No tenderness. Normal range of motion.     Cervical back: Normal range of motion and neck supple.  Skin:    General: Skin is warm.  Neurological:     Mental Status: She is alert and oriented to person, place, and time.  Psychiatric:        Mood and Affect: Affect normal.      LABORATORY DATA:  I have reviewed the data as listed Lab Results   Component Value Date   WBC 6.3 11/03/2021   HGB 12.9 11/03/2021   HCT 38.8 11/03/2021   MCV 92.4 11/03/2021   PLT 285 11/03/2021   Recent Labs    07/05/21 1253 11/03/21 1334  NA 138 137  K 3.9 3.7  CL 107 109  CO2 22 23  GLUCOSE 103* 125*  BUN 12 15  CREATININE 0.76 0.71  CALCIUM 8.7* 8.5*  GFRNONAA >60 >60  RADIOGRAPHIC STUDIES: I have personally reviewed the radiological images as listed and agreed with the findings in the report. No results found.   ASSESSMENT & PLAN:   Iron deficiency # Iron def/ B12 def- gastric bypass/-B12 deficiency on B12 injection-July 2022-ferritin 6/iron deficiency-s/p IV iron infusion. POOR tolerance to oral iron.   # Improved energy levels.  Proceed with maintenance B12/iron infusions every 3 to 4 months.  # Fatigue: ? Etiology - OSA vs others. TSH jan 2023- WNL.   # RA- poorly controlled- refer to Dr.Patel [s/p arthro- sec to RA]-stable.  # DISPOSITION: # venofer/ B12 today # Follow up MD 4 months- labs- cbc/bmp; iron studies;ferritoni; B12 levels;possible venofer; B12 injection-Dr.B  All questions were answered. The patient knows to call the clinic with any problems, questions or concerns.   Cammie Sickle, MD 11/03/2021 3:20 PM

## 2021-11-03 NOTE — Assessment & Plan Note (Addendum)
#   Iron def/ B12 def- gastric bypass/-B12 deficiency on B12 injection-July 2022-ferritin 6/iron deficiency-s/p IV iron infusion. POOR tolerance to oral iron. Proceed with maintenance B12/iron infusions every 3 to 4 months.  #Worsening fatigue/daytime sleepiness: ? Etiology - OSA vs others. TSH jan 2023- WNL.   # RA- poorly controlled- refer to Dr.Patel [s/p arthro- sec to RA]-stable.  # DISPOSITION: # venofer/ B12 today # Follow up MD 4 months- labs- cbc/bmp; iron studies;ferritoni; B12 levels;possible venofer; B12 injection-Dr.B

## 2021-11-03 NOTE — Progress Notes (Signed)
Worsening fatigue with excessive sleepiness.  Has received 2-3 weekly Vitamin B12 injections at PCP with last injection 09/2021.  Having left let edema that has been evaluated by PCP and vascular with no abnormal findings.  BP 145/98, HR 79-had a stressful day at work.

## 2021-11-15 ENCOUNTER — Encounter (INDEPENDENT_AMBULATORY_CARE_PROVIDER_SITE_OTHER): Payer: Self-pay

## 2021-12-04 ENCOUNTER — Emergency Department
Admission: EM | Admit: 2021-12-04 | Discharge: 2021-12-05 | Disposition: A | Discharge status: Home / Self Care | Payer: BC Managed Care – PPO | Attending: Emergency Medicine | Admitting: Emergency Medicine

## 2021-12-04 DIAGNOSIS — J45909 Unspecified asthma, uncomplicated: Secondary | ICD-10-CM | POA: Insufficient documentation

## 2021-12-04 DIAGNOSIS — T7809XA Anaphylactic reaction due to other food products, initial encounter: Secondary | ICD-10-CM | POA: Insufficient documentation

## 2021-12-04 DIAGNOSIS — Z7951 Long term (current) use of inhaled steroids: Secondary | ICD-10-CM | POA: Diagnosis not present

## 2021-12-04 DIAGNOSIS — T782XXA Anaphylactic shock, unspecified, initial encounter: Secondary | ICD-10-CM

## 2021-12-04 MED ORDER — IPRATROPIUM-ALBUTEROL 0.5-2.5 (3) MG/3ML IN SOLN
3.0000 mL | Freq: Once | RESPIRATORY_TRACT | Status: AC
  Administered 2021-12-04: 3 mL via RESPIRATORY_TRACT
  Filled 2021-12-04: qty 3

## 2021-12-04 MED ORDER — METHYLPREDNISOLONE SODIUM SUCC 125 MG IJ SOLR
125.0000 mg | Freq: Once | INTRAMUSCULAR | Status: AC
  Administered 2021-12-04: 125 mg via INTRAVENOUS
  Filled 2021-12-04: qty 2

## 2021-12-04 NOTE — ED Provider Notes (Signed)
Care assumed from Dr. Cinda Quest, patient presented to the emergency department for anaphylactic reaction.  Symptoms have improved.  Plan to observe in the emergency department and if continues to have resolution of her symptoms discharged home with prescription for EpiPen.  On reevaluation no recurrence of allergic reaction symptoms.  Will discharge home with an EpiPen.   Nathaniel Man, MD 12/05/21 [phone]

## 2021-12-04 NOTE — ED Provider Notes (Signed)
   [facility] Provider Note    Event Date/Time   First MD Initiated Contact with Patient 12/04/21 2218     (approximate)   History   Allergic Reaction (Patient C/O anaphylactic reaction to cinnamon apples)   HPI  Debbie Sosa is a 46 y.o. female with a past history of asthma who had an allergic reaction and anaphylaxis to a cinnamon apple.  Patient had a lot of wheezing per EMS.  She received EpiPen and a DuoNeb and albuterol and famotidine and Benadryl.  She is now sleeping her sats are good there were low initially.      Physical Exam   Triage Vital Signs: ED Triage Vitals  Enc Vitals Group     BP      Pulse      Resp      Temp      Temp src      SpO2      Weight      Height      Head Circumference      Peak Flow      Pain Score      Pain Loc      Pain Edu?      Excl. in [place]?     Most recent vital signs: Vitals:   12/04/21 2218 12/04/21 2300  BP: (!) 112/54   Pulse: 84 81  Resp: (!) 23 (!) 25  Temp: 98.4 F (36.9 C)   SpO2: 98% 96%     General: Patient now sleepy but arousable after the Benadryl CV:  Good peripheral perfusion.  Heart regular rate and rhythm no audible murmurs Resp:  Normal effort.  Lungs are clear with good air movement Abd:  No distention.  Soft and nontender Extremities with no edema   ED Results / Procedures / Treatments   Labs (all labs ordered are listed, but only abnormal results are displayed) Labs Reviewed - No data to display   EKG     RADIOLOGY    PROCEDURES:  Critical Care performed:   Procedures   MEDICATIONS ORDERED IN ED: Medications  methylPREDNISolone sodium succinate (SOLU-MEDROL) 125 mg/2 mL injection 125 mg (125 mg Intravenous Given 12/04/21 2224)  ipratropium-albuterol (DUONEB) 0.5-2.5 (3) MG/3ML nebulizer solution 3 mL (3 mLs Nebulization Given 12/04/21 2224)     IMPRESSION / MDM / ASSESSMENT AND PLAN / ED COURSE  I reviewed the triage vital signs and the  nursing notes. Insert timestamp patient resting comfortably currently I am signing the patient out to the oncoming physician and we will have to watch her for several hours.   Patient's presentation is most consistent with acute presentation with potential threat to life or bodily function.  he patient is on the cardiac monitor to evaluate for evidence of arrhythmia and/or significant heart rate changes.  None have been seen We will have the to watch the patient carefully.  Is possible she may need admission if she has worsening course.   FINAL CLINICAL IMPRESSION(S) / ED DIAGNOSES   Final diagnoses:  Anaphylaxis, initial encounter     Rx / DC Orders   ED Discharge Orders     None        Note:  This document was prepared using Dragon voice recognition software and may include unintentional dictation errors.   Nena Polio, MD 12/04/21 2322

## 2021-12-05 ENCOUNTER — Other Ambulatory Visit: Payer: Self-pay

## 2021-12-05 MED ORDER — EPINEPHRINE 0.3 MG/0.3ML IJ SOAJ: 0.3000 mg | INTRAMUSCULAR | 1 refills | Status: DC | PRN

## 2021-12-28 ENCOUNTER — Other Ambulatory Visit: Payer: Self-pay | Admitting: Internal Medicine

## 2021-12-28 DIAGNOSIS — Z1231 Encounter for screening mammogram for malignant neoplasm of breast: Secondary | ICD-10-CM

## 2022-01-14 ENCOUNTER — Ambulatory Visit
Admission: RE | Admit: 2022-01-14 | Discharge: 2022-01-14 | Disposition: A | Discharge status: Home / Self Care | Payer: BC Managed Care – PPO | Source: Ambulatory Visit | Attending: Internal Medicine | Admitting: Internal Medicine

## 2022-01-14 DIAGNOSIS — Z1231 Encounter for screening mammogram for malignant neoplasm of breast: Secondary | ICD-10-CM | POA: Diagnosis not present

## 2022-01-25 DIAGNOSIS — R739 Hyperglycemia, unspecified: Secondary | ICD-10-CM | POA: Insufficient documentation

## 2022-02-28 ENCOUNTER — Encounter: Payer: Self-pay | Admitting: Internal Medicine

## 2022-02-28 ENCOUNTER — Other Ambulatory Visit: Payer: Self-pay | Admitting: Internal Medicine

## 2022-02-28 DIAGNOSIS — R3129 Other microscopic hematuria: Secondary | ICD-10-CM

## 2022-02-28 DIAGNOSIS — R1031 Right lower quadrant pain: Secondary | ICD-10-CM

## 2022-03-01 ENCOUNTER — Ambulatory Visit: Admission: RE | Admit: 2022-03-01 | Payer: BC Managed Care – PPO | Source: Ambulatory Visit

## 2022-03-08 ENCOUNTER — Other Ambulatory Visit: Payer: BC Managed Care – PPO

## 2022-03-08 ENCOUNTER — Ambulatory Visit: Payer: BC Managed Care – PPO | Admitting: Internal Medicine

## 2022-03-08 ENCOUNTER — Ambulatory Visit: Payer: BC Managed Care – PPO

## 2022-03-09 ENCOUNTER — Ambulatory Visit
Admission: RE | Admit: 2022-03-09 | Discharge: 2022-03-09 | Disposition: A | Discharge status: Home / Self Care | Payer: BC Managed Care – PPO | Source: Ambulatory Visit | Attending: Internal Medicine | Admitting: Internal Medicine

## 2022-03-09 DIAGNOSIS — R1031 Right lower quadrant pain: Secondary | ICD-10-CM

## 2022-03-09 DIAGNOSIS — R3129 Other microscopic hematuria: Secondary | ICD-10-CM

## 2022-03-10 ENCOUNTER — Ambulatory Visit
Admission: RE | Admit: 2022-03-10 | Discharge: 2022-03-10 | Disposition: A | Discharge status: Home / Self Care | Payer: BC Managed Care – PPO | Source: Ambulatory Visit | Attending: Internal Medicine | Admitting: Internal Medicine

## 2022-03-10 DIAGNOSIS — R1031 Right lower quadrant pain: Secondary | ICD-10-CM | POA: Diagnosis not present

## 2022-03-10 DIAGNOSIS — R1032 Left lower quadrant pain: Secondary | ICD-10-CM | POA: Diagnosis present

## 2022-03-10 DIAGNOSIS — R3129 Other microscopic hematuria: Secondary | ICD-10-CM | POA: Insufficient documentation

## 2022-03-10 MED ORDER — IOHEXOL 300 MG/ML  SOLN
100.0000 mL | Freq: Once | INTRAMUSCULAR | Status: AC | PRN
  Administered 2022-03-10: 100 mL via INTRAVENOUS

## 2022-03-11 ENCOUNTER — Inpatient Hospital Stay: Payer: BC Managed Care – PPO | Attending: Internal Medicine

## 2022-03-11 ENCOUNTER — Inpatient Hospital Stay: Payer: BC Managed Care – PPO

## 2022-03-11 ENCOUNTER — Encounter: Payer: Self-pay | Admitting: Internal Medicine

## 2022-03-11 ENCOUNTER — Inpatient Hospital Stay (HOSPITAL_BASED_OUTPATIENT_CLINIC_OR_DEPARTMENT_OTHER): Payer: BC Managed Care – PPO | Admitting: Internal Medicine

## 2022-03-11 VITALS — BP 110/80 | HR 75 | Resp 18

## 2022-03-11 VITALS — BP 103/83 | HR 80 | Temp 97.6°F | Resp 18 | Wt 195.2 lb

## 2022-03-11 DIAGNOSIS — E611 Iron deficiency: Secondary | ICD-10-CM

## 2022-03-11 DIAGNOSIS — M069 Rheumatoid arthritis, unspecified: Secondary | ICD-10-CM | POA: Diagnosis not present

## 2022-03-11 DIAGNOSIS — K760 Fatty (change of) liver, not elsewhere classified: Secondary | ICD-10-CM | POA: Insufficient documentation

## 2022-03-11 DIAGNOSIS — E538 Deficiency of other specified B group vitamins: Secondary | ICD-10-CM | POA: Insufficient documentation

## 2022-03-11 LAB — BASIC METABOLIC PANEL
Anion gap: 8 (ref 5–15)
BUN: 16 mg/dL (ref 6–20)
CO2: 26 mmol/L (ref 22–32)
Calcium: 8.9 mg/dL (ref 8.9–10.3)
Chloride: 104 mmol/L (ref 98–111)
Creatinine, Ser: 0.9 mg/dL (ref 0.44–1.00)
GFR, Estimated: >60 mL/min (ref >60)
Glucose, Bld: 113 mg/dL — ABNORMAL HIGH (ref 70–99)
Potassium: 3.5 mmol/L (ref 3.5–5.1)
Sodium: 138 mmol/L (ref 135–145)

## 2022-03-11 LAB — CBC WITH DIFFERENTIAL/PLATELET
Abs Immature Granulocytes: 0.03 10*3/uL (ref 0.00–0.07)
Basophils Absolute: 0 10*3/uL (ref 0.0–0.1)
Basophils Relative: 0 %
Eosinophils Absolute: 0 10*3/uL (ref 0.0–0.5)
Eosinophils Relative: 0 %
HCT: 40.1 % (ref 36.0–46.0)
Hemoglobin: 13.5 g/dL (ref 12.0–15.0)
Immature Granulocytes: 0 %
Lymphocytes Relative: 19 %
Lymphs Abs: 2.1 10*3/uL (ref 0.7–4.0)
MCH: 30.5 pg (ref 26.0–34.0)
MCHC: 33.7 g/dL (ref 30.0–36.0)
MCV: 90.5 fL (ref 80.0–100.0)
Monocytes Absolute: 0.8 10*3/uL (ref 0.1–1.0)
Monocytes Relative: 7 %
Neutro Abs: 8.1 10*3/uL — ABNORMAL HIGH (ref 1.7–7.7)
Neutrophils Relative %: 74 %
Platelets: 318 10*3/uL (ref 150–400)
RBC: 4.43 MIL/uL (ref 3.87–5.11)
RDW: 12.9 % (ref 11.5–15.5)
WBC: 10.9 10*3/uL — ABNORMAL HIGH (ref 4.0–10.5)
nRBC: 0 % (ref 0.0–0.2)

## 2022-03-11 LAB — VITAMIN B12: Vitamin B-12: 124 pg/mL — ABNORMAL LOW (ref 180–914)

## 2022-03-11 LAB — IRON AND TIBC
Iron: 86 ug/dL (ref 28–170)
Saturation Ratios: 27 % (ref 10.4–31.8)
TIBC: 319 ug/dL (ref 250–450)
UIBC: 233 ug/dL

## 2022-03-11 LAB — FERRITIN: Ferritin: 98 ng/mL (ref 11–307)

## 2022-03-11 MED ORDER — CYANOCOBALAMIN 1000 MCG/ML IJ SOLN
1000.0000 ug | Freq: Once | INTRAMUSCULAR | Status: AC
  Administered 2022-03-11: 1000 ug via INTRAMUSCULAR
  Filled 2022-03-11: qty 1

## 2022-03-11 MED ORDER — SODIUM CHLORIDE 0.9 % IV SOLN
200.0000 mg | Freq: Once | INTRAVENOUS | Status: AC
  Administered 2022-03-11: 200 mg via INTRAVENOUS
  Filled 2022-03-11: qty 200

## 2022-03-11 MED ORDER — SODIUM CHLORIDE 0.9 % IV SOLN
Freq: Once | INTRAVENOUS | Status: AC
  Filled 2022-03-11: qty 250

## 2022-03-11 NOTE — Progress Notes (Signed)
[facility] NOTE  Patient Care Team: Idelle Crouch, MD as PCP - General (Internal Medicine) Cammie Sickle, MD as Consulting Physician (Oncology)  CHIEF COMPLAINTS/PURPOSE OF CONSULTATION: Iron/B12 deficiency  #Iron deficiency/B12 deficiency-gastric bypass; poor tolerance to p.o. iron.  # 2002- PE [2 weeks post partum] x18 months- coumadin; 2009- SVT- right chest wall- [[facility]]-short-term anticoagulation;  # ANTICRADIOLIPIN ANTIBODY[rheumatology evaluation; Dr.Patel];-08/2019--Pos: Anticardiolipin Ab IgM; Neg: Anticardiolipin Ab IgG, Beta-2Glyco 1 IgA, IgM, IgG, No lupus anticoagulant;   # Rheumatoid arthritis, stage 1; Diagnosed 08/2019; Positive RF; synovitis of the joint; elevated sed rate'l  Continue HCQ 200 mg BID  # Hx of gastric Bypass; hx of COVID infection admitted [Jan 2021]    Oncology History   No history exists.   HISTORY OF PRESENTING ILLNESS: Alone.  Ambulating independently.    Debbie Sosa 47 y.o.  female with iron deficiency/B12 deficiency gastric bypass is here for follow-up.   Pt has a kidney stone per CT scan. Chronic fatigue. Appetite is down. Mild nausea intermittently.   Pt has bursitis of L hip. Pt denies any dizziness. No visible blood noted.   Denies any blood in stools or black or stools.  Poor tolerance to oral iron.  Review of Systems  Constitutional:  Positive for malaise/fatigue. Negative for chills, diaphoresis, fever and weight loss.  HENT:  Negative for nosebleeds and sore throat.   Eyes:  Negative for double vision.  Respiratory:  Negative for cough, hemoptysis, sputum production, shortness of breath and wheezing.   Cardiovascular:  Negative for chest pain, palpitations, orthopnea and leg swelling.  Gastrointestinal:  Negative for abdominal pain, blood in stool, constipation, diarrhea, heartburn, melena, nausea and vomiting.  Genitourinary:  Negative for dysuria, frequency and urgency.  Musculoskeletal:   Positive for back pain and joint pain.  Skin: Negative.  Negative for itching and rash.  Neurological:  Negative for dizziness, tingling, focal weakness, weakness and headaches.  Endo/Heme/Allergies:  Does not bruise/bleed easily.  Psychiatric/Behavioral:  Negative for depression. The patient is not nervous/anxious and does not have insomnia.      MEDICAL HISTORY:  Past Medical History:  Diagnosis Date   Anemia    Antiphospholipid antibody positive    Asthma    Chronic venous insufficiency    COVID-19 2020   DDD (degenerative disc disease), lumbar    GERD (gastroesophageal reflux disease)    Headache    h/o migraines   History of hiatal hernia    History of kidney stones    Iron deficiency    recieves iron infusions every 3 months at the cancer center   Lymphedema    bil legs-sees [place] [facility]   Nausea and vomiting 10/14/2015   Obesity    Pneumonia    Pulmonary embolism (HCC)    after 2nd c-section   Sleep apnea    had gastric bypass and does not need to use cpap since surgery   Vitamin B12 deficiency     SURGICAL HISTORY: Past Surgical History:  Procedure Laterality Date   BREAST BIOPSY Left 2006   benign/clip   BREAST BIOPSY Right 2015   benign/clip   CESAREAN SECTION     CESAREAN SECTION     CHOLECYSTECTOMY     COLONOSCOPY     COLONOSCOPY WITH PROPOFOL N/A 10/31/2018   Procedure: COLONOSCOPY WITH PROPOFOL;  Surgeon: [name], MD;  Location: [facility] ENDOSCOPY;  Service: Gastroenterology;  Laterality: N/A;   ESOPHAGOGASTRODUODENOSCOPY (EGD) WITH PROPOFOL N/A 10/04/2016   Procedure: ESOPHAGOGASTRODUODENOSCOPY (EGD)  WITH PROPOFOL;  Surgeon: [name], MD;  Location: [facility] ENDOSCOPY;  Service: Gastroenterology;  Laterality: N/A;   ESOPHAGOGASTRODUODENOSCOPY (EGD) WITH PROPOFOL N/A 04/14/2017   Procedure: ESOPHAGOGASTRODUODENOSCOPY (EGD) WITH PROPOFOL;  Surgeon: [name], MD;  Location: [facility] ENDOSCOPY;  Service: Gastroenterology;  Laterality: N/A;    ESOPHAGOGASTRODUODENOSCOPY (EGD) WITH PROPOFOL N/A 10/31/2018   Procedure: ESOPHAGOGASTRODUODENOSCOPY (EGD) WITH PROPOFOL;  Surgeon: [name], MD;  Location: [facility] ENDOSCOPY;  Service: Gastroenterology;  Laterality: N/A;   ESOPHAGOGASTRODUODENOSCOPY (EGD) WITH PROPOFOL N/A 06/28/2019   Procedure: ESOPHAGOGASTRODUODENOSCOPY (EGD) WITH PROPOFOL;  Surgeon: [name], MD;  Location: [facility] ENDOSCOPY;  Service: Gastroenterology;  Laterality: N/A;   ESOPHAGOGASTRODUODENOSCOPY (EGD) WITH PROPOFOL N/A 02/10/2021   Procedure: ESOPHAGOGASTRODUODENOSCOPY (EGD) WITH PROPOFOL;  Surgeon: [name], MD;  Location: [facility] ENDOSCOPY;  Service: Gastroenterology;  Laterality: N/A;   GASTRIC BYPASS     01/2015   HYSTEROSCOPY WITH D & C N/A 09/17/2021   Procedure: DILATATION AND CURETTAGE /HYSTEROSCOPY;  Surgeon: Will Bonnet, MD;  Location: [facility] ORS;  Service: Gynecology;  Laterality: N/A;   INTRAUTERINE DEVICE (IUD) INSERTION N/A 09/17/2021   Procedure: INTRAUTERINE DEVICE (IUD) INSERTION - MIRENA;  Surgeon: Will Bonnet, MD;  Location: [facility] ORS;  Service: Gynecology;  Laterality: N/A;   IUD REMOVAL N/A 09/17/2021   Procedure: INTRAUTERINE DEVICE (IUD) REMOVAL;  Surgeon: Will Bonnet, MD;  Location: [facility] ORS;  Service: Gynecology;  Laterality: N/A;   KNEE SURGERY Right    NASAL SINUS SURGERY     PLANTAR FASCIA SURGERY     x 3   REDUCTION MAMMAPLASTY Bilateral 2005    SOCIAL HISTORY: Social History   Socioeconomic History   Marital status: Single    Spouse name: Not on file   Number of children: Not on file   Years of education: Not on file   Highest education level: Not on file  Occupational History   Not on file  Tobacco Use   Smoking status: Never   Smokeless tobacco: Never  Vaping Use   Vaping Use: Never used  Substance and Sexual Activity   Alcohol use: No   Drug use: No   Sexual activity: Not on file  Other Topics Concern   Not on file  Social History Narrative   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    Social Determinants of Health   Financial Resource Strain: Not on file  Food Insecurity: Not on file  Transportation Needs: Not on file  Physical Activity: Not on file  Stress: Not on file  Social Connections: Not on file  Intimate Partner Violence: Not on file    FAMILY HISTORY: Family History  Problem Relation Age of Onset   Breast cancer Mother 76   Breast cancer Cousin 69   Breast cancer Cousin 76   Asthma Father    Asthma Sister    Asthma Child     ALLERGIES:  is allergic to avocado, cephalexin, fexofenadine, iodinated contrast media, ketorolac, morphine, promethazine, diflucan [fluconazole], apple, cetirizine, diclofenac sodium, doxycycline, latex, other, tramadol, hepatitis b virus vaccines, misc. sulfonamide containing compounds, morphine and related, naproxen, oxycodone-acetaminophen, pyridium [phenazopyridine hcl], sulfa antibiotics, sulfasalazine, and vioxx [rofecoxib].  MEDICATIONS:  Current Outpatient Medications  Medication Sig Dispense Refill   albuterol (VENTOLIN HFA) 108 (90 Base) MCG/ACT inhaler Inhale 1-2 puffs INH Q4-6hr prn for chest tightness, cough, wheezing, SOB/DOE. 18 g 0   cyanocobalamin (,VITAMIN B-12,) 1000 MCG/ML injection Inject 1,000 mcg into the  muscle once a week.     EPINEPHrine (EPIPEN 2-PAK) 0.3 mg/0.3 mL IJ SOAJ injection Inject 0.3 mLs (0.3 mg total) into the muscle as needed for anaphylaxis. 1 each 1   EPINEPHrine 0.3 mg/0.3 mL IJ SOAJ injection Inject 0.3 mg into the muscle as needed for anaphylaxis. 1 each 1   levocetirizine (XYZAL) 5 MG tablet Take 5 mg by mouth every morning.     levonorgestrel (MIRENA) 20 MCG/DAY IUD by Intrauterine route.     montelukast (SINGULAIR) 10 MG tablet Take 10 mg by mouth as needed.     omeprazole (PRILOSEC) 40 MG capsule Take 1 capsule (40 mg total) by mouth every morning.     Vitamin D,  Ergocalciferol, (DRISDOL) 1.25 MG (50000 UT) CAPS capsule Take 50,000 Units by mouth once a week. Mondays     No current facility-administered medications for this visit.      Marland Kitchen  PHYSICAL EXAMINATION:  Vitals:   03/11/22 1345  BP: 103/83  Pulse: 80  Resp: 18  Temp: 97.6 F (36.4 C)  SpO2: 100%    Filed Weights   03/11/22 1345  Weight: 195 lb 3.2 oz (88.5 kg)     Physical Exam HENT:     Head: Normocephalic and atraumatic.     Mouth/Throat:     Pharynx: No oropharyngeal exudate.  Eyes:     Pupils: Pupils are equal, round, and reactive to light.  Cardiovascular:     Rate and Rhythm: Normal rate and regular rhythm.  Pulmonary:     Effort: No respiratory distress.     Breath sounds: No wheezing.  Abdominal:     General: Bowel sounds are normal. There is no distension.     Palpations: Abdomen is soft. There is no mass.     Tenderness: There is no abdominal tenderness. There is no guarding or rebound.  Musculoskeletal:        General: No tenderness. Normal range of motion.     Cervical back: Normal range of motion and neck supple.  Skin:    General: Skin is warm.  Neurological:     Mental Status: She is alert and oriented to person, place, and time.  Psychiatric:        Mood and Affect: Affect normal.      LABORATORY DATA:  I have reviewed the data as listed Lab Results  Component Value Date   WBC 10.9 (H) 03/11/2022   HGB 13.5 03/11/2022   HCT 40.1 03/11/2022   MCV 90.5 03/11/2022   PLT 318 03/11/2022   Recent Labs    07/05/21 1253 11/03/21 1334 03/11/22 1318  NA 138 137 138  K 3.9 3.7 3.5  CL 107 109 104  CO2 '22 23 26  '$ GLUCOSE 103* 125* 113*  BUN '12 15 16  '$ CREATININE 0.76 0.71 0.90  CALCIUM 8.7* 8.5* 8.9  GFRNONAA >60 >60 >60    RADIOGRAPHIC STUDIES: I have personally reviewed the radiological images as listed and agreed with the findings in the report. CT ABDOMEN PELVIS W CONTRAST  Result Date: 03/10/2022 CLINICAL DATA:  Lower abdominal  pain. EXAM: CT ABDOMEN AND PELVIS WITH CONTRAST TECHNIQUE: Multidetector CT imaging of the abdomen and pelvis was performed using the standard protocol following bolus administration of intravenous contrast. RADIATION DOSE REDUCTION: This exam was performed according to the departmental dose-optimization program which includes automated exposure control, adjustment of the mA and/or kV according to patient size and/or use of iterative reconstruction technique. CONTRAST:  121m OMNIPAQUE IOHEXOL 300  MG/ML  SOLN COMPARISON:  CT abdomen pelvis dated 12/26/2018. FINDINGS: Lower chest: There are bibasilar dependent atelectasis. The visualized lung bases are otherwise clear. No intra-abdominal free air or free fluid. Hepatobiliary: Fatty liver. No biliary ductal dilatation. Cholecystectomy. No retained calcified stone noted in the central CBD. Pancreas: Unremarkable. No pancreatic ductal dilatation or surrounding inflammatory changes. Spleen: Normal in size without focal abnormality. Adrenals/Urinary Tract: The adrenal glands are unremarkable. There is a punctate nonobstructing right renal interpolar calculus. No hydronephrosis. The left kidney is unremarkable. Stomach/Bowel: Postsurgical changes of gastric bypass. There is no bowel obstruction or active inflammation. The appendix is normal. Vascular/Lymphatic: The abdominal aorta and IVC are unremarkable. No portal venous gas. There is no adenopathy. Reproductive: The uterus is retroverted. No adnexal masses. An intrauterine device is noted. Other: None Musculoskeletal: No acute or significant osseous findings. IMPRESSION: 1. No acute intra-abdominal or pelvic pathology. 2. Fatty liver. 3. Punctate nonobstructing right renal interpolar calculus. No hydronephrosis. Electronically Signed   By: Anner Crete M.D.   On: 03/10/2022 19:24     ASSESSMENT & PLAN:   Iron deficiency # Iron def/ B12 def- gastric bypass/-B12 deficiency on B12 injection-July 2022-ferritin  6/iron deficiency-s/p IV iron infusion. POOR tolerance to oral iron.   # Proceed with maintenance B12/iron infusions every 3 to 4 months.  # B12 def- June 2023- 154 low-   # fatty liver-[CT scan Feb 2024] recommend lose weight; follow up PCP/obesity medicine specialist.   # RA-Dr.Patel [s/p arthro- sec to RA]-stable.  # DISPOSITION: # venofer/ B12 today # Follow up MD- 4 months- labs- cbc/bmp; iron studies;ferritn; B12 levels;possible venofer; B12 injection-Dr.B  All questions were answered. The patient knows to call the clinic with any problems, questions or concerns.   Cammie Sickle, MD 03/11/2022 2:53 PM

## 2022-03-11 NOTE — Assessment & Plan Note (Addendum)
#   Iron def/ B12 def- gastric bypass/-B12 deficiency on B12 injection-July 2022-ferritin 6/iron deficiency-s/p IV iron infusion. POOR tolerance to oral iron.   # Proceed with maintenance B12/iron infusions every 3 to 4 months.  # B12 def- June 2023- 154 low-   # fatty liver-[CT scan Feb 2024] recommend lose weight; follow up PCP/obesity medicine specialist.   # RA-Dr.Patel [s/p arthro- sec to RA]-stable.  # DISPOSITION: # venofer/ B12 today # Follow up MD- 4 months- labs- cbc/bmp; iron studies;ferritn; B12 levels;possible venofer; B12 injection-Dr.B

## 2022-03-11 NOTE — Progress Notes (Signed)
Pt has been educated and understands. Pt refused to stay 30 mins after iron infusion.  VSS.

## 2022-03-11 NOTE — Progress Notes (Signed)
Pt has a kidney stone per CT scan. Pt has bursitis of L hip. Pt denies any dizziness. No visible blood noted. Chronic fatigue. Appetite is down. Mild nausea intermittently.

## 2022-03-25 ENCOUNTER — Ambulatory Visit (INDEPENDENT_AMBULATORY_CARE_PROVIDER_SITE_OTHER): Payer: BC Managed Care – PPO | Admitting: Vascular Surgery

## 2022-03-29 ENCOUNTER — Other Ambulatory Visit: Payer: Self-pay | Admitting: Gastroenterology

## 2022-03-29 DIAGNOSIS — T8339XA Other mechanical complication of intrauterine contraceptive device, initial encounter: Secondary | ICD-10-CM

## 2022-03-29 DIAGNOSIS — N920 Excessive and frequent menstruation with regular cycle: Secondary | ICD-10-CM

## 2022-04-25 ENCOUNTER — Other Ambulatory Visit: Payer: Self-pay

## 2022-04-25 DIAGNOSIS — Z1231 Encounter for screening mammogram for malignant neoplasm of breast: Secondary | ICD-10-CM

## 2022-05-03 ENCOUNTER — Ambulatory Visit: Payer: Self-pay | Admitting: Urology

## 2022-06-27 ENCOUNTER — Telehealth: Payer: Self-pay

## 2022-06-27 NOTE — Telephone Encounter (Signed)
Patient came to the office and ask if she could double up on her Omeprazole 40 MG BID to TID. I told her that I would have to send a message to Dr. Tobi Bastos and ask what he recommend for her to do. Patient stated that her current symptoms are nausea, dysphagia, choking, dry heaving sometimes and abdominal pain after she eats any food. Patient stated that she uses a wedge pillow at night time to help but that even that is not helping. Patient scheduled an appointment to see Inetta Fermo in 3 weeks as that is the soonest that she could see somebody.  Please advise.

## 2022-06-28 NOTE — Telephone Encounter (Signed)
Patient was contacted to let her know that Dr. Tobi Bastos recommended for her to stop taking Omeprazole and start taking Voquezna 20 MG 1 tablet once a day for 7 days and to let us know if it works for her so we could send in a prescription to her pharmacy. Patient understood and had no further questions.  Voquezna 20 MG samples were left at the front desk.

## 2022-06-28 NOTE — Telephone Encounter (Signed)
Dr. Tobi Bastos, we only have Voquezna 20 MG. Is it okay for the patient to take 1 tablet once a day? How many refills can I send?

## 2022-06-28 NOTE — Telephone Encounter (Signed)
Stop ppi and start voquenza 20 mg daily for 1 week only- give samples- if works will work with company to get her

## 2022-06-30 MED FILL — Iron Sucrose Inj 20 MG/ML (Fe Equiv): INTRAVENOUS | Qty: 10 | Status: AC

## 2022-07-01 ENCOUNTER — Inpatient Hospital Stay: Payer: BC Managed Care – PPO

## 2022-07-01 ENCOUNTER — Encounter: Payer: Self-pay | Admitting: Internal Medicine

## 2022-07-01 ENCOUNTER — Inpatient Hospital Stay: Payer: BC Managed Care – PPO | Attending: Internal Medicine | Admitting: Internal Medicine

## 2022-07-01 DIAGNOSIS — R600 Localized edema: Secondary | ICD-10-CM | POA: Diagnosis not present

## 2022-07-01 DIAGNOSIS — E611 Iron deficiency: Secondary | ICD-10-CM | POA: Diagnosis not present

## 2022-07-01 DIAGNOSIS — Z8616 Personal history of COVID-19: Secondary | ICD-10-CM | POA: Insufficient documentation

## 2022-07-01 DIAGNOSIS — Z9884 Bariatric surgery status: Secondary | ICD-10-CM | POA: Insufficient documentation

## 2022-07-01 DIAGNOSIS — M069 Rheumatoid arthritis, unspecified: Secondary | ICD-10-CM | POA: Diagnosis not present

## 2022-07-01 DIAGNOSIS — E538 Deficiency of other specified B group vitamins: Secondary | ICD-10-CM | POA: Diagnosis present

## 2022-07-01 LAB — IRON AND TIBC
Iron: 88 ug/dL (ref 28–170)
Saturation Ratios: 29 % (ref 10.4–31.8)
TIBC: 308 ug/dL (ref 250–450)
UIBC: 220 ug/dL

## 2022-07-01 LAB — CBC WITH DIFFERENTIAL (CANCER CENTER ONLY)
Abs Immature Granulocytes: 0.02 10*3/uL (ref 0.00–0.07)
Basophils Absolute: 0.1 10*3/uL (ref 0.0–0.1)
Basophils Relative: 1 %
Eosinophils Absolute: 0.2 10*3/uL (ref 0.0–0.5)
Eosinophils Relative: 3 %
HCT: 42 % (ref 36.0–46.0)
Hemoglobin: 14 g/dL (ref 12.0–15.0)
Immature Granulocytes: 0 %
Lymphocytes Relative: 32 %
Lymphs Abs: 1.8 10*3/uL (ref 0.7–4.0)
MCH: 30.4 pg (ref 26.0–34.0)
MCHC: 33.3 g/dL (ref 30.0–36.0)
MCV: 91.1 fL (ref 80.0–100.0)
Monocytes Absolute: 0.3 10*3/uL (ref 0.1–1.0)
Monocytes Relative: 6 %
Neutro Abs: 3.3 10*3/uL (ref 1.7–7.7)
Neutrophils Relative %: 58 %
Platelet Count: 278 10*3/uL (ref 150–400)
RBC: 4.61 MIL/uL (ref 3.87–5.11)
RDW: 12.9 % (ref 11.5–15.5)
WBC Count: 5.7 10*3/uL (ref 4.0–10.5)
nRBC: 0 % (ref 0.0–0.2)

## 2022-07-01 LAB — BASIC METABOLIC PANEL - CANCER CENTER ONLY
Anion gap: 11 (ref 5–15)
BUN: 12 mg/dL (ref 6–20)
CO2: 19 mmol/L — ABNORMAL LOW (ref 22–32)
Calcium: 8.8 mg/dL — ABNORMAL LOW (ref 8.9–10.3)
Chloride: 106 mmol/L (ref 98–111)
Creatinine: 0.85 mg/dL (ref 0.44–1.00)
GFR, Estimated: >60 mL/min (ref >60)
Glucose, Bld: 201 mg/dL — ABNORMAL HIGH (ref 70–99)
Potassium: 3.7 mmol/L (ref 3.5–5.1)
Sodium: 136 mmol/L (ref 135–145)

## 2022-07-01 LAB — VITAMIN B12: Vitamin B-12: 204 pg/mL (ref 180–914)

## 2022-07-01 LAB — FERRITIN: Ferritin: 147 ng/mL (ref 11–307)

## 2022-07-01 MED ORDER — CYANOCOBALAMIN 1000 MCG/ML IJ SOLN
1000.0000 ug | Freq: Once | INTRAMUSCULAR | Status: AC
  Administered 2022-07-01: 1000 ug via INTRAMUSCULAR
  Filled 2022-07-01: qty 1

## 2022-07-01 NOTE — Addendum Note (Signed)
Addended by: Clydia Llano on: 07/01/2022 02:11 PM   Modules accepted: Orders

## 2022-07-01 NOTE — Progress Notes (Signed)
HOLD venofer; proceed with B12 injection-today

## 2022-07-01 NOTE — Progress Notes (Signed)
[facility] CONSULT NOTE  Patient Care Team: Marguarite Arbour, MD as PCP - General (Internal Medicine) Earna Coder, MD as Consulting Physician (Oncology)  CHIEF COMPLAINTS/PURPOSE OF CONSULTATION: Iron/B12 deficiency  #Iron deficiency/B12 deficiency-gastric bypass; poor tolerance to p.o. iron.  # 2002- PE [2 weeks post partum] x18 months- coumadin; 2009- SVT- right chest wall- [[facility]]-short-term anticoagulation;  # ANTICRADIOLIPIN ANTIBODY[rheumatology evaluation; Dr.Patel];-08/2019--Pos: Anticardiolipin Ab IgM; Neg: Anticardiolipin Ab IgG, Beta-2Glyco 1 IgA, IgM, IgG, No lupus anticoagulant;   # Rheumatoid arthritis, stage 1; Diagnosed 08/2019; Positive RF; synovitis of the joint; elevated sed rate'l  Continue HCQ 200 mg BID  # Hx of gastric Bypass; hx of COVID infection admitted [Jan 2021]    Oncology History   No history exists.   HISTORY OF PRESENTING ILLNESS: Alone.  Ambulating independently.    Debbie Sosa 47 y.o.  female with iron deficiency/B12 deficiency gastric bypass is here for follow-up.   Complains of left leg swelling- s/p multiple evaluations. But improves with leg elevation.   Pt denies any dizziness. No visible blood noted.  Denies any blood in stools or black or stools.  Poor tolerance to oral iron.  Review of Systems  Constitutional:  Positive for malaise/fatigue. Negative for chills, diaphoresis, fever and weight loss.  HENT:  Negative for nosebleeds and sore throat.   Eyes:  Negative for double vision.  Respiratory:  Negative for cough, hemoptysis, sputum production, shortness of breath and wheezing.   Cardiovascular:  Negative for chest pain, palpitations, orthopnea and leg swelling.  Gastrointestinal:  Negative for abdominal pain, blood in stool, constipation, diarrhea, heartburn, melena, nausea and vomiting.  Genitourinary:  Negative for dysuria, frequency and urgency.  Musculoskeletal:  Positive for back pain and joint  pain.  Skin: Negative.  Negative for itching and rash.  Neurological:  Negative for dizziness, tingling, focal weakness, weakness and headaches.  Endo/Heme/Allergies:  Does not bruise/bleed easily.  Psychiatric/Behavioral:  Negative for depression. The patient is not nervous/anxious and does not have insomnia.      MEDICAL HISTORY:  Past Medical History:  Diagnosis Date   Anemia    Antiphospholipid antibody positive    Asthma    Chronic venous insufficiency    COVID-19 2020   DDD (degenerative disc disease), lumbar    GERD (gastroesophageal reflux disease)    Headache    h/o migraines   History of hiatal hernia    History of kidney stones    Iron deficiency    recieves iron infusions every 3 months at the cancer center   Lymphedema    bil legs-sees [place] V&[facility]   Nausea and vomiting 10/14/2015   Obesity    Pneumonia    Pulmonary embolism (HCC)    after 2nd c-section   Sleep apnea    had gastric bypass and does not need to use cpap since surgery   Vitamin B12 deficiency     SURGICAL HISTORY: Past Surgical History:  Procedure Laterality Date   BREAST BIOPSY Left 2006   benign/clip   BREAST BIOPSY Right 2015   benign/clip   CESAREAN SECTION     CESAREAN SECTION     CHOLECYSTECTOMY     COLONOSCOPY     COLONOSCOPY WITH PROPOFOL N/A 10/31/2018   Procedure: COLONOSCOPY WITH PROPOFOL;  Surgeon: Wyline Mood, MD;  Location: [facility] ENDOSCOPY;  Service: Gastroenterology;  Laterality: N/A;   ESOPHAGOGASTRODUODENOSCOPY (EGD) WITH PROPOFOL N/A 10/04/2016   Procedure: ESOPHAGOGASTRODUODENOSCOPY (EGD) WITH PROPOFOL;  Surgeon: Wyline Mood, MD;  Location: [facility]  ENDOSCOPY;  Service: Gastroenterology;  Laterality: N/A;   ESOPHAGOGASTRODUODENOSCOPY (EGD) WITH PROPOFOL N/A 04/14/2017   Procedure: ESOPHAGOGASTRODUODENOSCOPY (EGD) WITH PROPOFOL;  Surgeon: Wyline Mood, MD;  Location: [facility] ENDOSCOPY;  Service: Gastroenterology;  Laterality: N/A;   ESOPHAGOGASTRODUODENOSCOPY (EGD) WITH PROPOFOL  N/A 10/31/2018   Procedure: ESOPHAGOGASTRODUODENOSCOPY (EGD) WITH PROPOFOL;  Surgeon: Wyline Mood, MD;  Location: [facility] ENDOSCOPY;  Service: Gastroenterology;  Laterality: N/A;   ESOPHAGOGASTRODUODENOSCOPY (EGD) WITH PROPOFOL N/A 06/28/2019   Procedure: ESOPHAGOGASTRODUODENOSCOPY (EGD) WITH PROPOFOL;  Surgeon: Wyline Mood, MD;  Location: [facility] ENDOSCOPY;  Service: Gastroenterology;  Laterality: N/A;   ESOPHAGOGASTRODUODENOSCOPY (EGD) WITH PROPOFOL N/A 02/10/2021   Procedure: ESOPHAGOGASTRODUODENOSCOPY (EGD) WITH PROPOFOL;  Surgeon: Wyline Mood, MD;  Location: [facility] ENDOSCOPY;  Service: Gastroenterology;  Laterality: N/A;   GASTRIC BYPASS     01/2015   HYSTEROSCOPY WITH D & C N/A 09/17/2021   Procedure: DILATATION AND CURETTAGE /HYSTEROSCOPY;  Surgeon: Conard Novak, MD;  Location: [facility] ORS;  Service: Gynecology;  Laterality: N/A;   INTRAUTERINE DEVICE (IUD) INSERTION N/A 09/17/2021   Procedure: INTRAUTERINE DEVICE (IUD) INSERTION - MIRENA;  Surgeon: Conard Novak, MD;  Location: [facility] ORS;  Service: Gynecology;  Laterality: N/A;   IUD REMOVAL N/A 09/17/2021   Procedure: INTRAUTERINE DEVICE (IUD) REMOVAL;  Surgeon: Conard Novak, MD;  Location: [facility] ORS;  Service: Gynecology;  Laterality: N/A;   KNEE SURGERY Right    NASAL SINUS SURGERY     PLANTAR FASCIA SURGERY     x 3   REDUCTION MAMMAPLASTY Bilateral 2005    SOCIAL HISTORY: Social History   Socioeconomic History   Marital status: Single    Spouse name: Not on file   Number of children: Not on file   Years of education: Not on file   Highest education level: Not on file  Occupational History   Not on file  Tobacco Use   Smoking status: Never   Smokeless tobacco: Never  Vaping Use   Vaping Use: Never used  Substance and Sexual Activity   Alcohol use: No   Drug use: No   Sexual activity: Not on file  Other Topics Concern   Not on file  Social History Narrative   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    Social Determinants of Health   Financial Resource Strain: Not on file  Food Insecurity: Not on file  Transportation Needs: Not on file  Physical Activity: Not on file  Stress: Not on file  Social Connections: Not on file  Intimate Partner Violence: Not on file    FAMILY HISTORY: Family History  Problem Relation Age of Onset   Breast cancer Mother 66   Breast cancer Cousin 30   Breast cancer Cousin 73   Asthma Father    Asthma Sister    Asthma Child     ALLERGIES:  is allergic to avocado, cephalexin, fexofenadine, iodinated contrast media, ketorolac, morphine, promethazine, diflucan [fluconazole], apple, cetirizine, diclofenac sodium, doxycycline, latex, other, tramadol, hepatitis b virus vaccines, misc. sulfonamide containing compounds, morphine and codeine, naproxen, oxycodone-acetaminophen, pyridium [phenazopyridine hcl], sulfa antibiotics, sulfasalazine, and vioxx [rofecoxib].  MEDICATIONS:  Current Outpatient Medications  Medication Sig Dispense Refill   albuterol (VENTOLIN HFA) 108 (90 Base) MCG/ACT inhaler Inhale 1-2 puffs INH Q4-6hr prn for chest tightness, cough, wheezing, SOB/DOE. 18 g 0   cyanocobalamin (,VITAMIN B-12,) 1000 MCG/ML injection Inject 1,000 mcg into the muscle once a week.     EPINEPHrine (EPIPEN  2-PAK) 0.3 mg/0.3 mL IJ SOAJ injection Inject 0.3 mLs (0.3 mg total) into the muscle as needed for anaphylaxis. 1 each 1   EPINEPHrine 0.3 mg/0.3 mL IJ SOAJ injection Inject 0.3 mg into the muscle as needed for anaphylaxis. 1 each 1   levocetirizine (XYZAL) 5 MG tablet Take 5 mg by mouth every morning.     levonorgestrel (MIRENA) 20 MCG/DAY IUD by Intrauterine route.     montelukast (SINGULAIR) 10 MG tablet Take 10 mg by mouth as needed.     omeprazole (PRILOSEC) 40 MG capsule TAKE 1 CAPSULE(40 MG) BY MOUTH IN THE MORNING AND AT BEDTIME 180 capsule 3   torsemide (DEMADEX) 20 MG tablet  Take 20 mg by mouth daily.     Vitamin D, Ergocalciferol, (DRISDOL) 1.25 MG (50000 UT) CAPS capsule Take 50,000 Units by mouth once a week. Mondays     No current facility-administered medications for this visit.      Marland Kitchen  PHYSICAL EXAMINATION:  Vitals:   07/01/22 1304  BP: 111/82  Pulse: 99  Temp: 98.2 F (36.8 C)  SpO2: 99%    Filed Weights   07/01/22 1304  Weight: 199 lb (90.3 kg)     Physical Exam HENT:     Head: Normocephalic and atraumatic.     Mouth/Throat:     Pharynx: No oropharyngeal exudate.  Eyes:     Pupils: Pupils are equal, round, and reactive to light.  Cardiovascular:     Rate and Rhythm: Normal rate and regular rhythm.  Pulmonary:     Effort: No respiratory distress.     Breath sounds: No wheezing.  Abdominal:     General: Bowel sounds are normal. There is no distension.     Palpations: Abdomen is soft. There is no mass.     Tenderness: There is no abdominal tenderness. There is no guarding or rebound.  Musculoskeletal:        General: No tenderness. Normal range of motion.     Cervical back: Normal range of motion and neck supple.  Skin:    General: Skin is warm.  Neurological:     Mental Status: She is alert and oriented to person, place, and time.  Psychiatric:        Mood and Affect: Affect normal.      LABORATORY DATA:  I have reviewed the data as listed Lab Results  Component Value Date   WBC 5.7 07/01/2022   HGB 14.0 07/01/2022   HCT 42.0 07/01/2022   MCV 91.1 07/01/2022   PLT 278 07/01/2022   Recent Labs    11/03/21 1334 03/11/22 1318 07/01/22 1306  NA 137 138 136  K 3.7 3.5 3.7  CL 109 104 106  CO2 23 26 19*  GLUCOSE 125* 113* 201*  BUN 15 16 12   CREATININE 0.71 0.90 0.85  CALCIUM 8.5* 8.9 8.8*  GFRNONAA >60 >60 >60    RADIOGRAPHIC STUDIES: I have personally reviewed the radiological images as listed and agreed with the findings in the report. No results found.   ASSESSMENT & PLAN:   Iron deficiency #  Iron def/ B12 def- gastric bypass/-B12 deficiency on B12 injection-July 2022-ferritin 6/iron deficiency-s/p IV iron infusion. POOR tolerance to oral iron. Recommend barimelt+ iron [over-the-counter medication/can buy online].  2 a day- under the tongue. These pills need to dissolve under the tongue/and should be absorbed into your bloodstream.   # Proceed with maintenance B12/iron infusions every 3 to 4 months.  # B12 def- June 2023-  154 low- s/p B12 injection-   # RA-Dr.Patel [s/p arthro- sec to RA]-stable.  # Left LE- swelling- dependant edema [s/p PCP- vascular evaluation]- defer to PCP.   # DISPOSITION: # HOLD venofer; proceed with B12 injection-today # Follow up MD-4 months- labs- cbc/bmp; iron studies;ferritn; B12 levels; vit D 25-OH. possible venofer; B12 injection-Dr.B  All questions were answered. The patient knows to call the clinic with any problems, questions or concerns.   Earna Coder, MD 07/01/2022 1:58 PM

## 2022-07-01 NOTE — Progress Notes (Signed)
Fatigue/weakness: yes Dyspena: no Light headedness: no Blood in stool: no  

## 2022-07-01 NOTE — Assessment & Plan Note (Addendum)
#   Iron def/ B12 def- gastric bypass/-B12 deficiency on B12 injection-July 2022-ferritin 6/iron deficiency-s/p IV iron infusion. POOR tolerance to oral iron. Recommend barimelt+ iron [over-the-counter medication/can buy online].  2 a day- under the tongue. These pills need to dissolve under the tongue/and should be absorbed into your bloodstream.   # Proceed with maintenance B12/iron infusions every 3 to 4 months.  # B12 def- June 2023- 154 low- s/p B12 injection-   # RA-Dr.Patel [s/p arthro- sec to RA]-stable.  # Left LE- swelling- dependant edema [s/p PCP- vascular evaluation]- defer to PCP.   # DISPOSITION: # HOLD venofer; proceed with B12 injection-today # Follow up MD-4 months- labs- cbc/bmp; iron studies;ferritn; B12 levels; vit D 25-OH. possible venofer; B12 injection-Dr.B

## 2022-07-01 NOTE — Patient Instructions (Signed)
#   Recommend barimelt+ iron [over-the-counter medication/can buy online].  2 a day- under the tongue. These pills need to dissolve under the tongue/and should be absorbed into your bloodstream.

## 2022-07-01 NOTE — Patient Instructions (Signed)

## 2022-07-17 NOTE — Progress Notes (Unsigned)
Debbie Sosa, Debbie Sosa [address]  [address]  [place], [place] [address]  Main: [phone]  Fax: [phone]   Primary Care Physician: Marguarite Arbour, MD  Primary Gastroenterologist:  Dr. Wyline Mood   CC:  F/U Severe GERD, epigastric pain  HPI: Debbie Sosa is a 47 y.o. female   History of gastric bypass and cholecystectomy.  History of chronic abdominal pain likely secondary to IBS constipation.  03/2017: EGD- normal gastric bypass anatomy .  Biopsies of stomach showed atrophic gastritis and a repeat EGD in 3 years 09/05/2016: H. pylori stool antigen negative 10/31/2018: EGD: Normal-appearing Roux-en-Y gastric bypass.  Gastric biopsies showed intestinal metaplasia.   10/2018:  Colonoscopy: 3 hyperplastic polyps were resected.  12/26/2018: CT scan of the abdomen and pelvis with contrast no obstruction 01/15/2019: Abdominal x-ray for abdominal pain showed large volume of stool in ascending and transverse colon  11/23/2018: H. pylori stool antigen negative  06/28/2019: EGD: Gastric bypass noted.  Biopsies of stomach showed atrophic gastritis and intestinal metaplasia.   11/09/2020 hemoglobin 12.5 g. She sees Dr. Donneta Romberg for iron deficiency and B12 deficiency secondary to gastric bypass and received IV iron.  02/10/21:  Last EGD by Dr. Tobi Bastos showed esophageal candidiasis, Roux-en-Y gastrojejunostomy, healthy-appearing mucosa.  She was treated with Diflucan 200 Mg daily for 10 days.  She was taking omeprazole 40 Mg twice daily and still having epigastric pain/GERD.  Recently switched to Voquezna by Dr. Tobi Bastos.  Current treatment and symptoms: Med for constipation?  Labs 07/01/2022 showed glucose 201, normal BUN 12, creatinine 0.85 normal iron panel, low normal B12 of 204, normal hemoglobin 14, normal CBC.   Current Outpatient Medications  Medication Sig Dispense Refill   albuterol (VENTOLIN HFA) 108 (90 Base) MCG/ACT inhaler Inhale 1-2 puffs INH Q4-6hr prn for chest  tightness, cough, wheezing, SOB/DOE. 18 g 0   cyanocobalamin (,VITAMIN B-12,) 1000 MCG/ML injection Inject 1,000 mcg into the muscle once a week.     EPINEPHrine (EPIPEN 2-PAK) 0.3 mg/0.3 mL IJ SOAJ injection Inject 0.3 mLs (0.3 mg total) into the muscle as needed for anaphylaxis. 1 each 1   EPINEPHrine 0.3 mg/0.3 mL IJ SOAJ injection Inject 0.3 mg into the muscle as needed for anaphylaxis. 1 each 1   levocetirizine (XYZAL) 5 MG tablet Take 5 mg by mouth every morning.     levonorgestrel (MIRENA) 20 MCG/DAY IUD by Intrauterine route.     montelukast (SINGULAIR) 10 MG tablet Take 10 mg by mouth as needed.     omeprazole (PRILOSEC) 40 MG capsule TAKE 1 CAPSULE(40 MG) BY MOUTH IN THE MORNING AND AT BEDTIME 180 capsule 3   torsemide (DEMADEX) 20 MG tablet Take 20 mg by mouth daily.     Vitamin D, Ergocalciferol, (DRISDOL) 1.25 MG (50000 UT) CAPS capsule Take 50,000 Units by mouth once a week. Mondays     No current facility-administered medications for this visit.    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 sulfonamide containing compounds Rash 11/03/2021   Morphine and codeine Rash 06/18/2014   Naproxen Rash 07/01/2013   Oxycodone-acetaminophen Itching 03/12/2014   Pyridium [phenazopyridine hcl] Rash 06/18/2014   Sulfa antibiotics Rash 06/18/2014   Sulfasalazine Rash 06/18/2014   Vioxx  [rofecoxib] Rash 06/18/2014    Past Medical History:  Diagnosis Date   Anemia    Antiphospholipid antibody positive    Asthma    Chronic venous insufficiency    COVID-19 2020   DDD (degenerative disc disease), lumbar    GERD (gastroesophageal reflux disease)    Headache    h/o migraines   History of hiatal hernia    History of kidney stones    Iron deficiency    recieves iron infusions every 3 months at the cancer center   Lymphedema    bil legs-sees [place] V&V   Nausea and vomiting 10/14/2015   Obesity    Pneumonia    Pulmonary embolism (HCC)    after 2nd c-section   Sleep apnea    had gastric bypass and does not need to use cpap since surgery   Vitamin B12 deficiency     Past Surgical History:  Procedure Laterality Date   BREAST BIOPSY Left 2006   benign/clip   BREAST BIOPSY Right 2015   benign/clip   CESAREAN SECTION     CESAREAN SECTION     CHOLECYSTECTOMY     COLONOSCOPY     COLONOSCOPY WITH PROPOFOL N/A 10/31/2018   Procedure: COLONOSCOPY WITH PROPOFOL;  Surgeon: Wyline Mood, MD;  Location: [facility] ENDOSCOPY;  Service: Gastroenterology;  Laterality: N/A;   ESOPHAGOGASTRODUODENOSCOPY (EGD) WITH PROPOFOL N/A 10/04/2016   Procedure: ESOPHAGOGASTRODUODENOSCOPY (EGD) WITH PROPOFOL;  Surgeon: Wyline Mood, MD;  Location: [facility] ENDOSCOPY;  Service: Gastroenterology;  Laterality: N/A;   ESOPHAGOGASTRODUODENOSCOPY (EGD) WITH PROPOFOL N/A 04/14/2017   Procedure: ESOPHAGOGASTRODUODENOSCOPY (EGD) WITH PROPOFOL;  Surgeon: Wyline Mood, MD;  Location: [facility] ENDOSCOPY;  Service: Gastroenterology;  Laterality: N/A;   ESOPHAGOGASTRODUODENOSCOPY (EGD) WITH PROPOFOL N/A 10/31/2018   Procedure: ESOPHAGOGASTRODUODENOSCOPY (EGD) WITH PROPOFOL;  Surgeon: Wyline Mood, MD;  Location: [facility] ENDOSCOPY;  Service: Gastroenterology;  Laterality: N/A;   ESOPHAGOGASTRODUODENOSCOPY (EGD) WITH PROPOFOL N/A 06/28/2019   Procedure: ESOPHAGOGASTRODUODENOSCOPY (EGD) WITH PROPOFOL;  Surgeon: Wyline Mood,  MD;  Location: [facility] ENDOSCOPY;  Service: Gastroenterology;  Laterality: N/A;   ESOPHAGOGASTRODUODENOSCOPY (EGD) WITH PROPOFOL N/A 02/10/2021   Procedure: ESOPHAGOGASTRODUODENOSCOPY (EGD) WITH PROPOFOL;  Surgeon: Wyline Mood, MD;  Location: [facility] ENDOSCOPY;  Service: Gastroenterology;  Laterality: N/A;   GASTRIC BYPASS     01/2015   HYSTEROSCOPY WITH D & C N/A 09/17/2021   Procedure: DILATATION AND CURETTAGE /HYSTEROSCOPY;  Surgeon: Conard Novak, MD;  Location: [facility] ORS;  Service: Gynecology;  Laterality: N/A;   INTRAUTERINE DEVICE (IUD) INSERTION N/A 09/17/2021   Procedure: INTRAUTERINE DEVICE (IUD) INSERTION - MIRENA;  Surgeon: Conard Novak, MD;  Location: [facility] ORS;  Service: Gynecology;  Laterality: N/A;   IUD REMOVAL N/A 09/17/2021   Procedure: INTRAUTERINE DEVICE (IUD) REMOVAL;  Surgeon: Conard Novak, MD;  Location: [facility] ORS;  Service: Gynecology;  Laterality: N/A;   KNEE SURGERY Right    NASAL SINUS SURGERY     PLANTAR FASCIA SURGERY     x 3   REDUCTION MAMMAPLASTY Bilateral 2005    Review of Systems:    All systems reviewed and negative except where noted in HPI.   Physical Examination:   There were no vitals taken for  this visit.  General: Well-nourished, well-developed in no acute distress.  Eyes: No icterus. Conjunctivae pink. Mouth: Oropharyngeal mucosa moist and pink , no lesions erythema or exudate. Lungs: Clear to auscultation bilaterally. Non-labored. Heart: Regular rate and rhythm, no murmurs rubs or gallops.  Abdomen: Bowel sounds are normal; Abdomen is Soft; No hepatosplenomegaly, masses or hernias;  No Abdominal Tenderness; No guarding or rebound tenderness. Extremities: No lower extremity edema. No clubbing or deformities. Neuro: Alert and oriented x 3.  Grossly intact. Skin: Warm and dry, no jaundice.   Psych: Alert and cooperative, normal mood and affect.   Imaging Studies: No results found.  Assessment and Plan:   Debbie Sosa is a 47  y.o. y/o female   1.  Chronic abdominal pain  2.  History of gastric bypass surgery  3.  Irritable bowel syndrome  4.  Multifactorial anemia: Iron deficiency and B12 deficiency    Debbie Sosa, Debbie Sosa  Follow up in ***  BP check ***

## 2022-07-18 ENCOUNTER — Encounter: Payer: Self-pay | Admitting: Physician Assistant

## 2022-07-18 ENCOUNTER — Ambulatory Visit: Payer: BC Managed Care – PPO | Admitting: Physician Assistant

## 2022-07-18 VITALS — BP 100/63 | HR 82 | Temp 98.1°F | Ht 61.0 in | Wt 196.8 lb

## 2022-07-18 DIAGNOSIS — K219 Gastro-esophageal reflux disease without esophagitis: Secondary | ICD-10-CM | POA: Diagnosis not present

## 2022-07-18 MED ORDER — FAMOTIDINE 20 MG PO TABS: 20.0000 mg | ORAL_TABLET | Freq: Two times a day (BID) | ORAL | 11 refills | Status: DC

## 2022-07-18 MED ORDER — ESOMEPRAZOLE MAGNESIUM 40 MG PO CPDR: 40.0000 mg | DELAYED_RELEASE_CAPSULE | Freq: Every day | ORAL | 11 refills | Status: DC

## 2022-08-11 ENCOUNTER — Ambulatory Visit: Payer: BC Managed Care – PPO | Admitting: Gastroenterology

## 2022-08-12 ENCOUNTER — Encounter: Payer: Self-pay | Admitting: Emergency Medicine

## 2022-08-12 ENCOUNTER — Emergency Department
Admission: EM | Admit: 2022-08-12 | Discharge: 2022-08-13 | Disposition: A | Discharge status: Home / Self Care | Payer: BC Managed Care – PPO | Attending: Emergency Medicine | Admitting: Emergency Medicine

## 2022-08-12 ENCOUNTER — Other Ambulatory Visit: Payer: Self-pay

## 2022-08-12 DIAGNOSIS — T7840XA Allergy, unspecified, initial encounter: Secondary | ICD-10-CM | POA: Diagnosis not present

## 2022-08-12 DIAGNOSIS — R109 Unspecified abdominal pain: Secondary | ICD-10-CM | POA: Insufficient documentation

## 2022-08-12 DIAGNOSIS — T782XXA Anaphylactic shock, unspecified, initial encounter: Secondary | ICD-10-CM

## 2022-08-12 MED ORDER — SODIUM CHLORIDE 0.9 % IV BOLUS
1000.0000 mL | Freq: Once | INTRAVENOUS | Status: AC
  Administered 2022-08-12: 1000 mL via INTRAVENOUS

## 2022-08-12 MED ORDER — PREDNISONE 20 MG PO TABS: 40.0000 mg | ORAL_TABLET | Freq: Every day | ORAL | 0 refills | Status: AC

## 2022-08-12 MED ORDER — PANTOPRAZOLE SODIUM 40 MG IV SOLR
40.0000 mg | Freq: Once | INTRAVENOUS | Status: AC
  Administered 2022-08-12: 40 mg via INTRAVENOUS
  Filled 2022-08-12: qty 10

## 2022-08-12 MED ORDER — EPINEPHRINE 0.3 MG/0.3ML IJ SOAJ: 0.3000 mg | INTRAMUSCULAR | 1 refills | Status: DC | PRN

## 2022-08-12 NOTE — ED Triage Notes (Signed)
Pt bib [facility] from home for allergic reaction to avocado. Pt ate dinner at approx 8pm and believes she may have been exposed to avocados at that time. Pt began to experience shob and nausea. Self administered epi pen and 25mg  benadryl PO at approx 2100 prior to EMS arrival. EMS administered 125mg  solumedrol and 25mg  benadryl IV.   BP 145/73, HR 99, Spo2 96% RA

## 2022-08-12 NOTE — ED Notes (Signed)
Family updated as to patient's status.

## 2022-08-12 NOTE — ED Provider Notes (Signed)
[facility] Provider Note    Event Date/Time   First MD Initiated Contact with Patient 08/12/22 2145     (approximate)   History   Allergic Reaction   HPI  Debbie Sosa is a 47 y.o. female who presents to the emergency department today after concern for allergic reaction.  The patient has known allergies to avocado.  Went to [employer] today and did not see the person preparing her food, and contact with avocado but apparently she thinks they must have.  She did try taking Benadryl initially at home but then when her throat continued to feel like it was becoming tight and painful her friend gave her a shot of EpiPen.  She did start feeling better after that and during transport with EMS.  EMS gave further Benadryl and Solu-Medrol.  Patient is now complaining of some abdominal discomfort.     Physical Exam   Triage Vital Signs: ED Triage Vitals  Encounter Vitals Group     BP 08/12/22 2145 103/88     Systolic BP Percentile --      Diastolic BP Percentile --      Pulse Rate 08/12/22 2145 91     Resp 08/12/22 2145 (!) 26     Temp 08/12/22 2145 97.9 F (36.6 C)     Temp Source 08/12/22 2145 Oral     SpO2 08/12/22 2141 96 %     Weight 08/12/22 2145 197 lb (89.4 kg)     Height 08/12/22 2145 5\' 1"  (1.549 m)     Head Circumference --      Peak Flow --      Pain Score 08/12/22 2151 7     Pain Loc --      Pain Education --      Exclude from Growth Chart --     Most recent vital signs: Vitals:   08/12/22 2141 08/12/22 2145  BP:  103/88  Pulse:  91  Resp:  (!) 26  Temp:  97.9 F (36.6 C)  SpO2: 96% 100%   General: Awake, alert, oriented. CV:  Good peripheral perfusion. Regular rate and rhythm. Resp:  Normal effort. Lungs clear. Abd:  No distention.     ED Results / Procedures / Treatments   Labs (all labs ordered are listed, but only abnormal results are displayed) Labs Reviewed - No data to display   EKG  I, Phineas Semen, attending  physician, personally viewed and interpreted this EKG  EKG Time: 2145 Rate: 91 Rhythm: sinus rhythm Axis: normal Intervals: qtc 446 QRS: narrow ST changes: no st elevation Impression: some artifact limits interpretation however appears normal ekg    RADIOLOGY No   PROCEDURES:  Critical Care performed: No   MEDICATIONS ORDERED IN ED: Medications - No data to display   IMPRESSION / MDM / ASSESSMENT AND PLAN / ED COURSE  I reviewed the triage vital signs and the nursing notes.                              Differential diagnosis includes, but is not limited to, allergic reaction, viral illness  Patient's presentation is most consistent with acute presentation with potential threat to life or bodily function.   The patient is on the cardiac monitor to evaluate for evidence of arrhythmia and/or significant heart rate changes.  Patient presents to the emergency department today because of concerns for allergic reaction.  Patient does have  allergies to avocado and went to a [employer].  Had already received epinephrine and Solu-Medrol and Benadryl prior to my evaluation.  She does states she is feeling better.  Patient in no respiratory distress on my exam.  No wheezing appreciated.  Did discuss with patient that would plan on watching for 4 hours to make sure no further or worsening allergic type reactions arose.      FINAL CLINICAL IMPRESSION(S) / ED DIAGNOSES   Final diagnoses:  Allergic reaction, initial encounter     Note:  This document was prepared using Dragon voice recognition software and may include unintentional dictation errors.    Phineas Semen, MD 08/12/22 2232

## 2022-08-12 NOTE — Discharge Instructions (Addendum)
Please seek medical attention for any high fevers, chest pain, shortness of breath, change in behavior, persistent vomiting, bloody stool or any other new or concerning symptoms.  Take the prednisone as prescribed.  For the next day, take: Benadryl 25 mg every 6 hours then as needed for itching/rash Pepcid (Famotidine) 20 mg every 12 hours for 24 hours then as needed for itching  These can be purchased over-the-counter at your pharmacy or local drug store.

## 2022-08-13 NOTE — ED Provider Notes (Signed)
Patient monitored x 4 hours after epi with no recurrence of sx. She states she feels at baseline and is HDS, well appearing. Tolerating PO. D/c with prednisone, antihistamines and epipen.    Shaune Pollack, MD 08/13/22 [phone]

## 2022-11-04 ENCOUNTER — Inpatient Hospital Stay: Payer: BC Managed Care – PPO

## 2022-11-04 ENCOUNTER — Inpatient Hospital Stay: Payer: BC Managed Care – PPO | Attending: Internal Medicine

## 2022-11-04 ENCOUNTER — Inpatient Hospital Stay (HOSPITAL_BASED_OUTPATIENT_CLINIC_OR_DEPARTMENT_OTHER): Payer: BC Managed Care – PPO | Admitting: Nurse Practitioner

## 2022-11-04 ENCOUNTER — Encounter: Payer: Self-pay | Admitting: Nurse Practitioner

## 2022-11-04 VITALS — BP 106/63 | HR 73 | Temp 98.6°F | Resp 20 | Wt 197.3 lb

## 2022-11-04 DIAGNOSIS — M25559 Pain in unspecified hip: Secondary | ICD-10-CM | POA: Diagnosis not present

## 2022-11-04 DIAGNOSIS — E538 Deficiency of other specified B group vitamins: Secondary | ICD-10-CM | POA: Diagnosis present

## 2022-11-04 DIAGNOSIS — M069 Rheumatoid arthritis, unspecified: Secondary | ICD-10-CM | POA: Diagnosis not present

## 2022-11-04 DIAGNOSIS — E611 Iron deficiency: Secondary | ICD-10-CM | POA: Diagnosis not present

## 2022-11-04 LAB — BASIC METABOLIC PANEL
Anion gap: 7 (ref 5–15)
BUN: 12 mg/dL (ref 6–20)
CO2: 26 mmol/L (ref 22–32)
Calcium: 8.5 mg/dL — ABNORMAL LOW (ref 8.9–10.3)
Chloride: 103 mmol/L (ref 98–111)
Creatinine, Ser: 0.66 mg/dL (ref 0.44–1.00)
GFR, Estimated: >60 mL/min (ref >60)
Glucose, Bld: 125 mg/dL — ABNORMAL HIGH (ref 70–99)
Potassium: 4 mmol/L (ref 3.5–5.1)
Sodium: 136 mmol/L (ref 135–145)

## 2022-11-04 LAB — CBC WITH DIFFERENTIAL (CANCER CENTER ONLY)
Abs Immature Granulocytes: 0.03 10*3/uL (ref 0.00–0.07)
Basophils Absolute: 0 10*3/uL (ref 0.0–0.1)
Basophils Relative: 1 %
Eosinophils Absolute: 0.1 10*3/uL (ref 0.0–0.5)
Eosinophils Relative: 1 %
HCT: 38.8 % (ref 36.0–46.0)
Hemoglobin: 12.7 g/dL (ref 12.0–15.0)
Immature Granulocytes: 1 %
Lymphocytes Relative: 26 %
Lymphs Abs: 1.6 10*3/uL (ref 0.7–4.0)
MCH: 30.3 pg (ref 26.0–34.0)
MCHC: 32.7 g/dL (ref 30.0–36.0)
MCV: 92.6 fL (ref 80.0–100.0)
Monocytes Absolute: 0.4 10*3/uL (ref 0.1–1.0)
Monocytes Relative: 6 %
Neutro Abs: 4 10*3/uL (ref 1.7–7.7)
Neutrophils Relative %: 65 %
Platelet Count: 282 10*3/uL (ref 150–400)
RBC: 4.19 MIL/uL (ref 3.87–5.11)
RDW: 13.3 % (ref 11.5–15.5)
WBC Count: 6.1 10*3/uL (ref 4.0–10.5)
nRBC: 0 % (ref 0.0–0.2)

## 2022-11-04 LAB — VITAMIN B12: Vitamin B-12: 188 pg/mL (ref 180–914)

## 2022-11-04 LAB — IRON AND TIBC
Iron: 70 ug/dL (ref 28–170)
Saturation Ratios: 23 % (ref 10.4–31.8)
TIBC: 308 ug/dL (ref 250–450)
UIBC: 238 ug/dL

## 2022-11-04 LAB — FERRITIN: Ferritin: 92 ng/mL (ref 11–307)

## 2022-11-04 LAB — VITAMIN D 25 HYDROXY (VIT D DEFICIENCY, FRACTURES): Vit D, 25-Hydroxy: 51.88 ng/mL (ref 30–100)

## 2022-11-04 NOTE — Progress Notes (Signed)
[facility] CONSULT NOTE  Patient Care Team: Marguarite Arbour, MD as PCP - General (Internal Medicine) Earna Coder, MD as Consulting Physician (Oncology)  CHIEF COMPLAINTS/PURPOSE OF CONSULTATION: Iron/B12 deficiency  #Iron deficiency/B12 deficiency- gastric bypass; poor tolerance to p.o. iron.  # 2002- PE [2 weeks post partum] x18 months- coumadin; 2009- SVT- right chest wall- [[facility]]-short-term anticoagulation;  # ANTICRADIOLIPIN ANTIBODY[rheumatology evaluation; Dr.Patel];-08/2019--Pos: Anticardiolipin Ab IgM; Neg: Anticardiolipin Ab IgG, Beta-2Glyco 1 IgA, IgM, IgG, No lupus anticoagulant;   # Rheumatoid arthritis, stage 1; Diagnosed 08/2019; Positive RF; synovitis of the joint; elevated sed rate'l  Continue HCQ 200 mg BID  # Hx of gastric Bypass; hx of COVID infection admitted [Jan 2021]   Oncology History   No history exists.   HISTORY OF PRESENTING ILLNESS: Alone.  Ambulating independently.    Debbie Sosa 47 y.o. female with iron deficiency/B12 deficiency gastric bypass is here for follow-up. She has hip pain and is waiting for appt with ortho. She has fatigue but otherwise feels at baseline. She has done b12 injections at home in the past and would consider doing them again. Denies any neurologic complaints. Denies recent fevers or illnesses. Denies any easy bleeding or bruising. No melena or hematochezia. No pica or restless leg. Reports good appetite and denies weight loss. Denies chest pain. Denies any nausea, vomiting, constipation, or diarrhea. Denies urinary complaints. Patient offers no further specific complaints today.  Review of Systems  Constitutional:  Positive for malaise/fatigue. Negative for chills, diaphoresis, fever and weight loss.  HENT:  Negative for nosebleeds and sore throat.   Eyes:  Negative for double vision.  Respiratory:  Negative for cough, hemoptysis, sputum production, shortness of breath and wheezing.   Cardiovascular:   Negative for chest pain, palpitations, orthopnea and leg swelling.  Gastrointestinal:  Negative for abdominal pain, blood in stool, constipation, diarrhea, heartburn, melena, nausea and vomiting.  Genitourinary:  Negative for dysuria, frequency and urgency.  Musculoskeletal:  Positive for back pain and joint pain.  Skin: Negative.  Negative for itching and rash.  Neurological:  Negative for dizziness, tingling, focal weakness, weakness and headaches.  Endo/Heme/Allergies:  Does not bruise/bleed easily.  Psychiatric/Behavioral:  Negative for depression. The patient is not nervous/anxious and does not have insomnia.      MEDICAL HISTORY:  Past Medical History:  Diagnosis Date   Anemia    Antiphospholipid antibody positive    Asthma    Chronic venous insufficiency    COVID-19 2020   DDD (degenerative disc disease), lumbar    GERD (gastroesophageal reflux disease)    Headache    h/o migraines   History of hiatal hernia    History of kidney stones    Iron deficiency    recieves iron infusions every 3 months at the cancer center   Lymphedema    bil legs-sees [facility]   Nausea and vomiting 10/14/2015   Obesity    Pneumonia    Pulmonary embolism (HCC)    after 2nd c-section   Sleep apnea    had gastric bypass and does not need to use cpap since surgery   Vitamin B12 deficiency     SURGICAL HISTORY: Past Surgical History:  Procedure Laterality Date   BREAST BIOPSY Left 2006   benign/clip   BREAST BIOPSY Right 2015   benign/clip   CESAREAN SECTION     CESAREAN SECTION     CHOLECYSTECTOMY     COLONOSCOPY     COLONOSCOPY WITH PROPOFOL N/A 10/31/2018  Procedure: COLONOSCOPY WITH PROPOFOL;  Surgeon: Wyline Mood, MD;  Location: [facility] ENDOSCOPY;  Service: Gastroenterology;  Laterality: N/A;   ESOPHAGOGASTRODUODENOSCOPY (EGD) WITH PROPOFOL N/A 10/04/2016   Procedure: ESOPHAGOGASTRODUODENOSCOPY (EGD) WITH PROPOFOL;  Surgeon: Wyline Mood, MD;  Location: [facility] ENDOSCOPY;  Service:  Gastroenterology;  Laterality: N/A;   ESOPHAGOGASTRODUODENOSCOPY (EGD) WITH PROPOFOL N/A 04/14/2017   Procedure: ESOPHAGOGASTRODUODENOSCOPY (EGD) WITH PROPOFOL;  Surgeon: Wyline Mood, MD;  Location: [facility] ENDOSCOPY;  Service: Gastroenterology;  Laterality: N/A;   ESOPHAGOGASTRODUODENOSCOPY (EGD) WITH PROPOFOL N/A 10/31/2018   Procedure: ESOPHAGOGASTRODUODENOSCOPY (EGD) WITH PROPOFOL;  Surgeon: Wyline Mood, MD;  Location: [facility] ENDOSCOPY;  Service: Gastroenterology;  Laterality: N/A;   ESOPHAGOGASTRODUODENOSCOPY (EGD) WITH PROPOFOL N/A 06/28/2019   Procedure: ESOPHAGOGASTRODUODENOSCOPY (EGD) WITH PROPOFOL;  Surgeon: Wyline Mood, MD;  Location: [facility] ENDOSCOPY;  Service: Gastroenterology;  Laterality: N/A;   ESOPHAGOGASTRODUODENOSCOPY (EGD) WITH PROPOFOL N/A 02/10/2021   Procedure: ESOPHAGOGASTRODUODENOSCOPY (EGD) WITH PROPOFOL;  Surgeon: Wyline Mood, MD;  Location: [facility] ENDOSCOPY;  Service: Gastroenterology;  Laterality: N/A;   GASTRIC BYPASS     01/2015   HYSTEROSCOPY WITH D & C N/A 09/17/2021   Procedure: DILATATION AND CURETTAGE /HYSTEROSCOPY;  Surgeon: Conard Novak, MD;  Location: [facility] ORS;  Service: Gynecology;  Laterality: N/A;   INTRAUTERINE DEVICE (IUD) INSERTION N/A 09/17/2021   Procedure: INTRAUTERINE DEVICE (IUD) INSERTION - MIRENA;  Surgeon: Conard Novak, MD;  Location: [facility] ORS;  Service: Gynecology;  Laterality: N/A;   IUD REMOVAL N/A 09/17/2021   Procedure: INTRAUTERINE DEVICE (IUD) REMOVAL;  Surgeon: Conard Novak, MD;  Location: [facility] ORS;  Service: Gynecology;  Laterality: N/A;   KNEE SURGERY Right    NASAL SINUS SURGERY     PLANTAR FASCIA SURGERY     x 3   REDUCTION MAMMAPLASTY Bilateral 2005    SOCIAL HISTORY: Social History   Socioeconomic History   Marital status: Single    Spouse name: Not on file   Number of children: Not on file   Years of education: Not on file   Highest education level: Not on file  Occupational History   Not on file  Tobacco Use    Smoking status: Never   Smokeless tobacco: Never  Vaping Use   Vaping status: Never Used  Substance and Sexual Activity   Alcohol use: No   Drug use: No   Sexual activity: Not on file  Other Topics Concern   Not on file  Social History Narrative   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    Social Determinants of Health   Financial Resource Strain: Not on file  Food Insecurity: Not on file  Transportation Needs: Not on file  Physical Activity: Not on file  Stress: Not on file  Social Connections: Not on file  Intimate Partner Violence: Not on file    FAMILY HISTORY: Family History  Problem Relation Age of Onset   Breast cancer Mother 33   Breast cancer Cousin 30   Breast cancer Cousin 70   Asthma Father    Asthma Sister    Asthma Child     ALLERGIES:  is allergic to avocado, cephalexin, fexofenadine, iodinated contrast media, ketorolac, morphine, promethazine, diflucan [fluconazole], apple, cetirizine, diclofenac sodium, doxycycline, latex, other, tramadol, hepatitis b virus vaccines, misc. sulfonamide containing compounds, morphine and codeine, naproxen, oxycodone-acetaminophen, pyridium [phenazopyridine hcl], sulfa antibiotics, sulfasalazine, and vioxx [rofecoxib].  MEDICATIONS:  Current Outpatient Medications  Medication Sig Dispense Refill   albuterol (VENTOLIN HFA) 108 (  90 Base) MCG/ACT inhaler Inhale 1-2 puffs INH Q4-6hr prn for chest tightness, cough, wheezing, SOB/DOE. 18 g 0   cyanocobalamin (,VITAMIN B-12,) 1000 MCG/ML injection Inject 1,000 mcg into the muscle once a week.     EPINEPHrine (EPIPEN 2-PAK) 0.3 mg/0.3 mL IJ SOAJ injection Inject 0.3 mLs (0.3 mg total) into the muscle as needed for anaphylaxis. 1 each 1   EPINEPHrine (EPIPEN 2-PAK) 0.3 mg/0.3 mL IJ SOAJ injection Inject 0.3 mg into the muscle as needed for anaphylaxis. 1 each 1   EPINEPHrine 0.3 mg/0.3 mL IJ SOAJ  injection Inject 0.3 mg into the muscle as needed for anaphylaxis. 1 each 1   esomeprazole (NEXIUM) 40 MG capsule Take 1 capsule (40 mg total) by mouth daily at 12 noon. 30 capsule 11   famotidine (PEPCID) 20 MG tablet Take 1 tablet (20 mg total) by mouth 2 (two) times daily. 60 tablet 11   levocetirizine (XYZAL) 5 MG tablet Take 5 mg by mouth every morning.     levonorgestrel (MIRENA) 20 MCG/DAY IUD by Intrauterine route.     Vitamin D, Ergocalciferol, (DRISDOL) 1.25 MG (50000 UT) CAPS capsule Take 50,000 Units by mouth once a week. Mondays     No current facility-administered medications for this visit.   PHYSICAL EXAMINATION: Vitals:   11/04/22 1456  BP: 106/63  Pulse: 73  Resp: 20  Temp: 98.6 F (37 C)  SpO2: 100%   Filed Weights   11/04/22 1456  Weight: 197 lb 4.8 oz (89.5 kg)   Physical Exam Vitals reviewed.  Constitutional:      Appearance: She is not ill-appearing.  Eyes:     General: No scleral icterus.    Conjunctiva/sclera: Conjunctivae normal.  Cardiovascular:     Rate and Rhythm: Normal rate and regular rhythm.  Abdominal:     General: There is no distension.     Palpations: Abdomen is soft.     Tenderness: There is no abdominal tenderness. There is no guarding.  Musculoskeletal:        General: No deformity.     Right lower leg: No edema.     Left lower leg: No edema.  Lymphadenopathy:     Cervical: No cervical adenopathy.  Skin:    General: Skin is warm and dry.  Neurological:     Mental Status: She is alert and oriented to person, place, and time. Mental status is at baseline.  Psychiatric:        Mood and Affect: Mood normal.        Behavior: Behavior normal.    LABORATORY DATA:  I have reviewed the data as listed Lab Results  Component Value Date   WBC 6.1 11/04/2022   HGB 12.7 11/04/2022   HCT 38.8 11/04/2022   MCV 92.6 11/04/2022   PLT 282 11/04/2022   Recent Labs    03/11/22 1318 07/01/22 1306 11/04/22 1440  NA 138 136 136  K 3.5  3.7 4.0  CL 104 106 103  CO2 26 19* 26  GLUCOSE 113* 201* 125*  BUN 16 12 12   CREATININE 0.90 0.85 0.66  CALCIUM 8.9 8.8* 8.5*  GFRNONAA >60 >60 >60   Iron/TIBC/Ferritin/ %Sat    Component Value Date/Time   IRON 88 07/01/2022 1306   TIBC 308 07/01/2022 1306   FERRITIN 147 07/01/2022 1306   IRONPCTSAT 29 07/01/2022 1306     RADIOGRAPHIC STUDIES: I have personally reviewed the radiological images as listed and agreed with the findings in the report. No results  found.   ASSESSMENT & PLAN:   # Iron def/ B12 def- gastric bypass/-B12 deficiency on B12 injection-July 2022-ferritin 6/iron deficiency-s/p IV iron infusion. POOR tolerance to oral iron. Continue barimelt+iron- sublingual absorption. Hmg has improved and she's asymptomatic. Iron studies pending. Hold venofer today. She will likely need some maintenance in view of malabsorption lifelong.   # B12 deficiency- d/t gastric bypass. June 2023, B12 154 (low). On b12 injections. Last June 2024. Levels pending. Plan for home injections. Frequency based on results.    # RA-Dr.Patel [s/p arthro- sec to RA]-stable.   # Left LE- swelling- dependant edema [s/p PCP- vascular evaluation]- defer to PCP.    DISPOSITION: Hold venofer & b12 4 mo- labs (cbc, cmp, ferritin, iron studies, b12), Dr Donneta Romberg, +/- venofer- la   No problem-specific Assessment & Plan notes found for this encounter.  All questions were answered. The patient knows to call the clinic with any problems, questions or concerns.  Alinda Dooms, NP 11/04/2022

## 2022-11-16 ENCOUNTER — Other Ambulatory Visit: Payer: Self-pay | Admitting: Orthopedic Surgery

## 2022-11-16 DIAGNOSIS — M7062 Trochanteric bursitis, left hip: Secondary | ICD-10-CM

## 2022-11-18 ENCOUNTER — Encounter: Payer: Self-pay | Admitting: Orthopedic Surgery

## 2022-11-19 ENCOUNTER — Ambulatory Visit
Admission: RE | Admit: 2022-11-19 | Discharge: 2022-11-19 | Disposition: A | Discharge status: Home / Self Care | Payer: BC Managed Care – PPO | Source: Ambulatory Visit | Attending: Orthopedic Surgery | Admitting: Orthopedic Surgery

## 2022-11-19 DIAGNOSIS — M7062 Trochanteric bursitis, left hip: Secondary | ICD-10-CM

## 2022-11-26 ENCOUNTER — Other Ambulatory Visit: Payer: BC Managed Care – PPO

## 2022-12-12 ENCOUNTER — Other Ambulatory Visit: Payer: Self-pay | Admitting: Neurology

## 2023-01-16 ENCOUNTER — Encounter: Payer: Self-pay | Admitting: Internal Medicine

## 2023-01-17 ENCOUNTER — Ambulatory Visit
Admission: RE | Admit: 2023-01-17 | Discharge: 2023-01-17 | Disposition: A | Discharge status: Home / Self Care | Payer: BC Managed Care – PPO | Source: Ambulatory Visit

## 2023-01-17 DIAGNOSIS — Z1231 Encounter for screening mammogram for malignant neoplasm of breast: Secondary | ICD-10-CM | POA: Diagnosis present

## 2023-01-24 DIAGNOSIS — M6281 Muscle weakness (generalized): Secondary | ICD-10-CM | POA: Insufficient documentation

## 2023-01-24 DIAGNOSIS — M25552 Pain in left hip: Secondary | ICD-10-CM | POA: Insufficient documentation

## 2023-01-29 ENCOUNTER — Emergency Department
Admission: EM | Admit: 2023-01-29 | Discharge: 2023-01-30 | Disposition: A | Discharge status: Home / Self Care | Payer: 59 | Attending: Emergency Medicine | Admitting: Emergency Medicine

## 2023-01-29 ENCOUNTER — Encounter: Payer: Self-pay | Admitting: Internal Medicine

## 2023-01-29 ENCOUNTER — Other Ambulatory Visit: Payer: Self-pay

## 2023-01-29 DIAGNOSIS — E876 Hypokalemia: Secondary | ICD-10-CM | POA: Diagnosis not present

## 2023-01-29 DIAGNOSIS — T7840XA Allergy, unspecified, initial encounter: Secondary | ICD-10-CM | POA: Insufficient documentation

## 2023-01-29 LAB — BASIC METABOLIC PANEL
Anion gap: 15 (ref 5–15)
BUN: 15 mg/dL (ref 6–20)
CO2: 21 mmol/L — ABNORMAL LOW (ref 22–32)
Calcium: 8.9 mg/dL (ref 8.9–10.3)
Chloride: 103 mmol/L (ref 98–111)
Creatinine, Ser: 0.79 mg/dL (ref 0.44–1.00)
GFR, Estimated: >60 mL/min (ref >60)
Glucose, Bld: 125 mg/dL — ABNORMAL HIGH (ref 70–99)
Potassium: 2.6 mmol/L — CL (ref 3.5–5.1)
Sodium: 139 mmol/L (ref 135–145)

## 2023-01-29 LAB — CBC WITH DIFFERENTIAL/PLATELET
Abs Immature Granulocytes: 0.02 10*3/uL (ref 0.00–0.07)
Basophils Absolute: 0 10*3/uL (ref 0.0–0.1)
Basophils Relative: 0 %
Eosinophils Absolute: 0.2 10*3/uL (ref 0.0–0.5)
Eosinophils Relative: 2 %
HCT: 39.4 % (ref 36.0–46.0)
Hemoglobin: 13 g/dL (ref 12.0–15.0)
Immature Granulocytes: 0 %
Lymphocytes Relative: 57 %
Lymphs Abs: 4 10*3/uL (ref 0.7–4.0)
MCH: 30.3 pg (ref 26.0–34.0)
MCHC: 33 g/dL (ref 30.0–36.0)
MCV: 91.8 fL (ref 80.0–100.0)
Monocytes Absolute: 0.2 10*3/uL (ref 0.1–1.0)
Monocytes Relative: 3 %
Neutro Abs: 2.6 10*3/uL (ref 1.7–7.7)
Neutrophils Relative %: 38 %
Platelets: 324 10*3/uL (ref 150–400)
RBC: 4.29 MIL/uL (ref 3.87–5.11)
RDW: 12.8 % (ref 11.5–15.5)
WBC: 7 10*3/uL (ref 4.0–10.5)
nRBC: 0 % (ref 0.0–0.2)

## 2023-01-29 LAB — CBG MONITORING, ED: Glucose-Capillary: 99 mg/dL (ref 70–99)

## 2023-01-29 LAB — TROPONIN I (HIGH SENSITIVITY)
Troponin I (High Sensitivity): <2 ng/L (ref <18)
Troponin I (High Sensitivity): 3 ng/L (ref <18)

## 2023-01-29 LAB — MAGNESIUM: Magnesium: 1.6 mg/dL — ABNORMAL LOW (ref 1.7–2.4)

## 2023-01-29 MED ORDER — PREDNISONE 20 MG PO TABS: 40.0000 mg | ORAL_TABLET | Freq: Every day | ORAL | 0 refills | Status: AC

## 2023-01-29 MED ORDER — LACTATED RINGERS IV BOLUS
1000.0000 mL | Freq: Once | INTRAVENOUS | Status: AC
  Administered 2023-01-29: 1000 mL via INTRAVENOUS

## 2023-01-29 MED ORDER — EPINEPHRINE 0.3 MG/0.3ML IJ SOAJ: 0.3000 mg | INTRAMUSCULAR | 1 refills | Status: DC | PRN

## 2023-01-29 MED ORDER — POTASSIUM CHLORIDE 10 MEQ/100ML IV SOLN
10.0000 meq | Freq: Once | INTRAVENOUS | Status: AC
  Administered 2023-01-30: 10 meq via INTRAVENOUS
  Filled 2023-01-29: qty 100

## 2023-01-29 MED ORDER — POTASSIUM CHLORIDE CRYS ER 20 MEQ PO TBCR
40.0000 meq | EXTENDED_RELEASE_TABLET | Freq: Once | ORAL | Status: DC
Start: 2023-01-29 — End: 2023-01-30
  Filled 2023-01-29: qty 2

## 2023-01-29 NOTE — Discharge Instructions (Addendum)
 Please seek medical attention for any high fevers, chest pain, shortness of breath, change in behavior, persistent vomiting, bloody stool or any other new or concerning symptoms.

## 2023-01-29 NOTE — ED Provider Notes (Signed)
 [facility] Provider Note    Event Date/Time   First MD Initiated Contact with Patient 01/29/23 2022     (approximate)   History   Allergic Reaction   HPI  Debbie Sosa is a 48 y.o. female who presents to the emergency department today because of concerns for allergic reaction.  Patient has multiple allergies.  States that she was eating a new cheesecake when she had sudden onset of neuritis, tongue swelling.  She gave herself an EpiPen  at home.  EMS delivered a second EpiPen  as well as Solu-Medrol  and Benadryl  during transport.  The time my exam patient states she is feeling better.  She denies any recent illness.  Was in her normal state of health earlier today.     Physical Exam   Triage Vital Signs: ED Triage Vitals  Encounter Vitals Group     BP 01/29/23 2017 (!) 147/80     Systolic BP Percentile --      Diastolic BP Percentile --      Pulse Rate 01/29/23 2017 (!) 102     Resp 01/29/23 2017 (!) 22     Temp 01/29/23 2017 98.1 F (36.7 C)     Temp Source 01/29/23 2017 Oral     SpO2 01/29/23 2017 100 %     Weight --      Height --      Head Circumference --      Peak Flow --      Pain Score 01/29/23 2018 4     Pain Loc --      Pain Education --      Exclude from Growth Chart --     Most recent vital signs: Vitals:   01/29/23 2017  BP: (!) 147/80  Pulse: (!) 102  Resp: (!) 22  Temp: 98.1 F (36.7 C)  SpO2: 100%   General: Awake, alert, oriented. CV:  Good peripheral perfusion. Tachycardia. Resp:  Normal effort. Lungs clear. Abd:  No distention.   ED Results / Procedures / Treatments   Labs (all labs ordered are listed, but only abnormal results are displayed) Labs Reviewed  BASIC METABOLIC PANEL - Abnormal; Notable for the following components:      Result Value   Potassium 2.6 (*)    CO2 21 (*)    Glucose, Bld 125 (*)    All other components within normal limits  CBC WITH DIFFERENTIAL/PLATELET  CBG MONITORING, ED   TROPONIN I (HIGH SENSITIVITY)  TROPONIN I (HIGH SENSITIVITY)     EKG  I, Guadalupe Eagles, attending physician, personally viewed and interpreted this EKG  EKG Time: 2050 Rate: 88 Rhythm: sinus rhythm Axis: normal Intervals: qtc 441 QRS: narrow, q waves v1 ST changes: no st elevation Impression: abnormal ekg    RADIOLOGY None   PROCEDURES:  Critical Care performed: No  MEDICATIONS ORDERED IN ED: Medications - No data to display   IMPRESSION / MDM / ASSESSMENT AND PLAN / ED COURSE  I reviewed the triage vital signs and the nursing notes.                              Differential diagnosis includes, but is not limited to, anaphylaxis, allergic reaction  Patient's presentation is most consistent with acute presentation with potential threat to life or bodily function.   The patient is on the cardiac monitor to evaluate for evidence of arrhythmia and/or significant heart rate changes.  Patient presented to the emergency department today because of concerns for an allergic reaction.  At the time my exam patient states she is feeling better after receiving epi x 2, Solu-Medrol  and Benadryl .  Did discuss with patient that we will need to continue to monitor her.  Patient continues to feel improvement. Will plan on watching for a total of four hours.  If patient continues to feel improvement do think would be reasonable for patient to be discharged.  I have prepared paperwork in that eventuality.      FINAL CLINICAL IMPRESSION(S) / ED DIAGNOSES   Final diagnoses:  Allergic reaction, initial encounter    Note:  This document was prepared using Dragon voice recognition software and may include unintentional dictation errors.    Floy Roberts, MD 01/29/23 2227

## 2023-01-29 NOTE — ED Triage Notes (Signed)
 Patient brought in by medic with an anaphylaxis reaction to cheese cake. The patient us  from home. States she tried new cheese cake and immediately had allergic reaction. Was given autoinjector of epi at home. Given 0.3 Epi, 125 Solu Medrol , and 50 benadryl  en route. The patient is alert and oriented, slightly drowsy.

## 2023-01-29 NOTE — ED Notes (Signed)
Potassium 2.6. MD notified. 

## 2023-01-30 MED ORDER — POTASSIUM CHLORIDE 10 MEQ/100ML IV SOLN
10.0000 meq | Freq: Once | INTRAVENOUS | Status: AC
  Administered 2023-01-30: 10 meq via INTRAVENOUS
  Filled 2023-01-30: qty 100

## 2023-01-30 MED ORDER — MAGNESIUM SULFATE 2 GM/50ML IV SOLN
2.0000 g | Freq: Once | INTRAVENOUS | Status: AC
  Administered 2023-01-30: 2 g via INTRAVENOUS
  Filled 2023-01-30: qty 50

## 2023-01-30 MED ORDER — ACETAMINOPHEN 325 MG PO TABS
650.0000 mg | ORAL_TABLET | Freq: Once | ORAL | Status: AC
  Administered 2023-01-30: 650 mg via ORAL
  Filled 2023-01-30: qty 2

## 2023-01-30 NOTE — ED Provider Notes (Signed)
-----------------------------------------   12:54 AM on 01/30/2023 -----------------------------------------   Patient refusing oral potassium, states it upsets her stomach.  She knows that she will then need additional IV runs of potassium which will delay her discharge from the emergency department.  She understands this and desires potassium repletement through her IV.  ----------------------------------------- 2:40 AM on 01/30/2023 -----------------------------------------   Patient tolerating IV potassium.  Complains of mild headache.  Will administer Tylenol .  ----------------------------------------- 4:15 AM on 01/30/2023 -----------------------------------------   IV electrolyte repletion completed.  Patient overall feeling better and eager for discharge home.  Prescriptions for EpiPen  and prednisone  already sent to patient's pharmacy by previous provider.  Strict return precautions given.  Patient verbalizes understanding and agrees with plan of care.   Martin Belling J, MD 01/30/23 [phone]

## 2023-02-06 DIAGNOSIS — R7303 Prediabetes: Secondary | ICD-10-CM | POA: Insufficient documentation

## 2023-03-07 ENCOUNTER — Inpatient Hospital Stay: Payer: 59 | Attending: Internal Medicine

## 2023-03-07 ENCOUNTER — Encounter: Payer: Self-pay | Admitting: Internal Medicine

## 2023-03-07 ENCOUNTER — Inpatient Hospital Stay: Payer: 59

## 2023-03-07 ENCOUNTER — Inpatient Hospital Stay (HOSPITAL_BASED_OUTPATIENT_CLINIC_OR_DEPARTMENT_OTHER): Payer: 59 | Admitting: Internal Medicine

## 2023-03-07 VITALS — BP 120/90 | HR 75 | Temp 98.9°F | Resp 20 | Wt 203.2 lb

## 2023-03-07 DIAGNOSIS — E611 Iron deficiency: Secondary | ICD-10-CM

## 2023-03-07 DIAGNOSIS — M659 Unspecified synovitis and tenosynovitis, unspecified site: Secondary | ICD-10-CM | POA: Insufficient documentation

## 2023-03-07 DIAGNOSIS — E538 Deficiency of other specified B group vitamins: Secondary | ICD-10-CM | POA: Diagnosis present

## 2023-03-07 DIAGNOSIS — M069 Rheumatoid arthritis, unspecified: Secondary | ICD-10-CM | POA: Insufficient documentation

## 2023-03-07 LAB — BASIC METABOLIC PANEL - CANCER CENTER ONLY
Anion gap: 8 (ref 5–15)
BUN: 12 mg/dL (ref 6–20)
CO2: 25 mmol/L (ref 22–32)
Calcium: 8.6 mg/dL — ABNORMAL LOW (ref 8.9–10.3)
Chloride: 102 mmol/L (ref 98–111)
Creatinine: 0.77 mg/dL (ref 0.44–1.00)
GFR, Estimated: >60 mL/min (ref >60)
Glucose, Bld: 108 mg/dL — ABNORMAL HIGH (ref 70–99)
Potassium: 3.8 mmol/L (ref 3.5–5.1)
Sodium: 135 mmol/L (ref 135–145)

## 2023-03-07 LAB — CBC WITH DIFFERENTIAL/PLATELET
Abs Immature Granulocytes: 0.04 10*3/uL (ref 0.00–0.07)
Basophils Absolute: 0 10*3/uL (ref 0.0–0.1)
Basophils Relative: 1 %
Eosinophils Absolute: 0.2 10*3/uL (ref 0.0–0.5)
Eosinophils Relative: 2 %
HCT: 37.7 % (ref 36.0–46.0)
Hemoglobin: 12.5 g/dL (ref 12.0–15.0)
Immature Granulocytes: 1 %
Lymphocytes Relative: 23 %
Lymphs Abs: 1.5 10*3/uL (ref 0.7–4.0)
MCH: 30.5 pg (ref 26.0–34.0)
MCHC: 33.2 g/dL (ref 30.0–36.0)
MCV: 92 fL (ref 80.0–100.0)
Monocytes Absolute: 0.6 10*3/uL (ref 0.1–1.0)
Monocytes Relative: 8 %
Neutro Abs: 4.3 10*3/uL (ref 1.7–7.7)
Neutrophils Relative %: 65 %
Platelets: 281 10*3/uL (ref 150–400)
RBC: 4.1 MIL/uL (ref 3.87–5.11)
RDW: 12.8 % (ref 11.5–15.5)
WBC: 6.6 10*3/uL (ref 4.0–10.5)
nRBC: 0 % (ref 0.0–0.2)

## 2023-03-07 LAB — IRON AND TIBC
Iron: 66 ug/dL (ref 28–170)
Saturation Ratios: 23 % (ref 10.4–31.8)
TIBC: 290 ug/dL (ref 250–450)
UIBC: 224 ug/dL

## 2023-03-07 LAB — FERRITIN: Ferritin: 89 ng/mL (ref 11–307)

## 2023-03-07 LAB — VITAMIN D 25 HYDROXY (VIT D DEFICIENCY, FRACTURES): Vit D, 25-Hydroxy: 67.28 ng/mL (ref 30–100)

## 2023-03-07 LAB — VITAMIN B12: Vitamin B-12: 484 pg/mL (ref 180–914)

## 2023-03-07 NOTE — Progress Notes (Unsigned)
 Patient has no concerns

## 2023-03-07 NOTE — Assessment & Plan Note (Addendum)
#   Iron def/ B12 def- gastric bypass/-B12 deficiency on B12 injection-July 2022-ferritin 6/iron deficiency-s/p IV iron infusion. POOR tolerance to oral iron.  # Proceed with maintenance Venofer- Today- Hb 12.5- HOLD venofer.   # B12 def- June 2023- 154 low-currently on  B12 injection- monthly [[facility]]  # RA-Dr.Patel [s/p arthro- sec to RA]-stable.  # History of recent hypokalemia/hypomagnesemia-defer to PCP.  # DISPOSITION: # HOLD venofer  # Follow up MD-6  months- labs- cbc/bmp; iron studies;ferritn; B12 levels; vit D 25-OH. possible venofer;Dr.B

## 2023-03-07 NOTE — Progress Notes (Unsigned)
 [facility] CONSULT NOTE  Patient Care Team: Marguarite Arbour, MD as PCP - General (Internal Medicine) Earna Coder, MD as Consulting Physician (Oncology)  CHIEF COMPLAINTS/PURPOSE OF CONSULTATION: Iron/B12 deficiency  #Iron deficiency/B12 deficiency-gastric bypass; poor tolerance to p.o. iron.  # 2002- PE [2 weeks post partum] x18 months- coumadin; 2009- SVT- right chest wall- [[facility]]-short-term anticoagulation;  # ANTICRADIOLIPIN ANTIBODY[rheumatology evaluation; Dr.Patel];-08/2019--Pos: Anticardiolipin Ab IgM; Neg: Anticardiolipin Ab IgG, Beta-2Glyco 1 IgA, IgM, IgG, No lupus anticoagulant;   # Rheumatoid arthritis, stage 1; Diagnosed 08/2019; Positive RF; synovitis of the joint; elevated sed rate'l  Continue HCQ 200 mg BID  # Hx of gastric Bypass; hx of COVID infection admitted [Jan 2021]    Oncology History   No history exists.   HISTORY OF PRESENTING ILLNESS: Alone.  Ambulating independently.    Debbie Sosa 48 y.o.  female with iron deficiency/B12 deficiency gastric bypass is here for follow-up.   Had recent allergic reaction to cheese. S/p epipen at home. Seen in ER- s/p epipen- noted to have LOW K/mag.   Pt denies any dizziness. No visible blood noted.  Denies any blood in stools or black or stools.  Poor tolerance to oral iron.  Review of Systems  Constitutional:  Positive for malaise/fatigue. Negative for chills, diaphoresis, fever and weight loss.  HENT:  Negative for nosebleeds and sore throat.   Eyes:  Negative for double vision.  Respiratory:  Negative for cough, hemoptysis, sputum production, shortness of breath and wheezing.   Cardiovascular:  Negative for chest pain, palpitations, orthopnea and leg swelling.  Gastrointestinal:  Negative for abdominal pain, blood in stool, constipation, diarrhea, heartburn, melena, nausea and vomiting.  Genitourinary:  Negative for dysuria, frequency and urgency.  Musculoskeletal:  Positive for back  pain and joint pain.  Skin: Negative.  Negative for itching and rash.  Neurological:  Negative for dizziness, tingling, focal weakness, weakness and headaches.  Endo/Heme/Allergies:  Does not bruise/bleed easily.  Psychiatric/Behavioral:  Negative for depression. The patient is not nervous/anxious and does not have insomnia.      MEDICAL HISTORY:  Past Medical History:  Diagnosis Date   Anemia    Antiphospholipid antibody positive    Asthma    Chronic venous insufficiency    COVID-19 2020   DDD (degenerative disc disease), lumbar    GERD (gastroesophageal reflux disease)    Headache    h/o migraines   History of hiatal hernia    History of kidney stones    Iron deficiency    recieves iron infusions every 3 months at the cancer center   Lymphedema    bil legs-sees [place] [facility]   Nausea and vomiting 10/14/2015   Obesity    Pneumonia    Pulmonary embolism (HCC)    after 2nd c-section   Sleep apnea    had gastric bypass and does not need to use cpap since surgery   Vitamin B12 deficiency     SURGICAL HISTORY: Past Surgical History:  Procedure Laterality Date   BREAST BIOPSY Left 2006   benign/clip   BREAST BIOPSY Right 2015   benign/clip   CESAREAN SECTION     CESAREAN SECTION     CHOLECYSTECTOMY     COLONOSCOPY     COLONOSCOPY WITH PROPOFOL N/A 10/31/2018   Procedure: COLONOSCOPY WITH PROPOFOL;  Surgeon: Wyline Mood, MD;  Location: [facility] ENDOSCOPY;  Service: Gastroenterology;  Laterality: N/A;   ESOPHAGOGASTRODUODENOSCOPY (EGD) WITH PROPOFOL N/A 10/04/2016   Procedure: ESOPHAGOGASTRODUODENOSCOPY (EGD) WITH PROPOFOL;  Surgeon: Wyline Mood, MD;  Location: [facility] ENDOSCOPY;  Service: Gastroenterology;  Laterality: N/A;   ESOPHAGOGASTRODUODENOSCOPY (EGD) WITH PROPOFOL N/A 04/14/2017   Procedure: ESOPHAGOGASTRODUODENOSCOPY (EGD) WITH PROPOFOL;  Surgeon: Wyline Mood, MD;  Location: [facility] ENDOSCOPY;  Service: Gastroenterology;  Laterality: N/A;   ESOPHAGOGASTRODUODENOSCOPY (EGD)  WITH PROPOFOL N/A 10/31/2018   Procedure: ESOPHAGOGASTRODUODENOSCOPY (EGD) WITH PROPOFOL;  Surgeon: Wyline Mood, MD;  Location: [facility] ENDOSCOPY;  Service: Gastroenterology;  Laterality: N/A;   ESOPHAGOGASTRODUODENOSCOPY (EGD) WITH PROPOFOL N/A 06/28/2019   Procedure: ESOPHAGOGASTRODUODENOSCOPY (EGD) WITH PROPOFOL;  Surgeon: Wyline Mood, MD;  Location: [facility] ENDOSCOPY;  Service: Gastroenterology;  Laterality: N/A;   ESOPHAGOGASTRODUODENOSCOPY (EGD) WITH PROPOFOL N/A 02/10/2021   Procedure: ESOPHAGOGASTRODUODENOSCOPY (EGD) WITH PROPOFOL;  Surgeon: Wyline Mood, MD;  Location: [facility] ENDOSCOPY;  Service: Gastroenterology;  Laterality: N/A;   GASTRIC BYPASS     01/2015   HYSTEROSCOPY WITH D & C N/A 09/17/2021   Procedure: DILATATION AND CURETTAGE /HYSTEROSCOPY;  Surgeon: Conard Novak, MD;  Location: [facility] ORS;  Service: Gynecology;  Laterality: N/A;   INTRAUTERINE DEVICE (IUD) INSERTION N/A 09/17/2021   Procedure: INTRAUTERINE DEVICE (IUD) INSERTION - MIRENA;  Surgeon: Conard Novak, MD;  Location: [facility] ORS;  Service: Gynecology;  Laterality: N/A;   IUD REMOVAL N/A 09/17/2021   Procedure: INTRAUTERINE DEVICE (IUD) REMOVAL;  Surgeon: Conard Novak, MD;  Location: [facility] ORS;  Service: Gynecology;  Laterality: N/A;   KNEE SURGERY Right    NASAL SINUS SURGERY     PLANTAR FASCIA SURGERY     x 3   REDUCTION MAMMAPLASTY Bilateral 2005    SOCIAL HISTORY: Social History   Socioeconomic History   Marital status: Single    Spouse name: Not on file   Number of children: Not on file   Years of education: Not on file   Highest education level: Not on file  Occupational History   Not on file  Tobacco Use   Smoking status: Never   Smokeless tobacco: Never  Vaping Use   Vaping status: Never Used  Substance and Sexual Activity   Alcohol use: No   Drug use: No   Sexual activity: Not on file  Other Topics Concern   Not on file  Social History Narrative   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    Social Drivers of Corporate investment banker Strain: Low Risk  (02/24/2023)   Received from [facility]   Overall Financial Resource Strain (CARDIA)    Difficulty of Paying Living Expenses: Not hard at all  Food Insecurity: No Food Insecurity (02/24/2023)   Received from [facility]   Hunger Vital Sign    Worried About Running Out of Food in the Last Year: Never true    Ran Out of Food in the Last Year: Never true  Transportation Needs: No Transportation Needs (02/24/2023)   Received from [facility] - Transportation    In the past 12 months, has lack of transportation kept you from medical appointments or from getting medications?: No    Lack of Transportation (Non-Medical): No  Physical Activity: Not on file  Stress: Not on file  Social Connections: Not on file  Intimate Partner Violence: Not on file    FAMILY HISTORY: Family History  Problem Relation Age of Onset   Breast cancer Mother 17   Breast cancer Cousin 30   Breast cancer Cousin 62  Asthma Father    Asthma Sister    Asthma Child     ALLERGIES:  is allergic to avocado, cephalexin, fexofenadine, iodinated contrast media, ketorolac, morphine, promethazine, diflucan [fluconazole], apple, cetirizine, diclofenac sodium, doxycycline, latex, other, tramadol, hepatitis b virus vaccines, misc. sulfonamide containing compounds, morphine and codeine, naproxen, oxycodone-acetaminophen, pyridium [phenazopyridine hcl], sulfa antibiotics, sulfasalazine, and vioxx [rofecoxib].  MEDICATIONS:  Current Outpatient Medications  Medication Sig Dispense Refill   albuterol (VENTOLIN HFA) 108 (90 Base) MCG/ACT inhaler Inhale 1-2 puffs INH Q4-6hr prn for chest tightness, cough, wheezing, SOB/DOE. 18 g 0   cyanocobalamin (,VITAMIN B-12,) 1000 MCG/ML injection Inject 1,000 mcg into the muscle once a  week.     EPINEPHrine (EPIPEN 2-PAK) 0.3 mg/0.3 mL IJ SOAJ injection Inject 0.3 mLs (0.3 mg total) into the muscle as needed for anaphylaxis. 1 each 1   EPINEPHrine (EPIPEN 2-PAK) 0.3 mg/0.3 mL IJ SOAJ injection Inject 0.3 mg into the muscle as needed for anaphylaxis. 1 each 1   EPINEPHrine (EPIPEN 2-PAK) 0.3 mg/0.3 mL IJ SOAJ injection Inject 0.3 mg into the muscle as needed. 1 each 1   EPINEPHrine 0.3 mg/0.3 mL IJ SOAJ injection Inject 0.3 mg into the muscle as needed for anaphylaxis. 1 each 1   esomeprazole (NEXIUM) 40 MG capsule Take 1 capsule (40 mg total) by mouth daily at 12 noon. 30 capsule 11   famotidine (PEPCID) 20 MG tablet Take 1 tablet (20 mg total) by mouth 2 (two) times daily. 60 tablet 11   levocetirizine (XYZAL) 5 MG tablet Take 5 mg by mouth every morning.     levonorgestrel (MIRENA) 20 MCG/DAY IUD by Intrauterine route.     traZODone (DESYREL) 50 MG tablet Take by mouth.     Vitamin D, Ergocalciferol, (DRISDOL) 1.25 MG (50000 UT) CAPS capsule Take 50,000 Units by mouth once a week. Mondays     No current facility-administered medications for this visit.      Marland Kitchen  PHYSICAL EXAMINATION:  Vitals:   03/07/23 1545  BP: (!) 120/90  Pulse: 75  Resp: 20  Temp: 98.9 F (37.2 C)  SpO2: 100%    Filed Weights   03/07/23 1545  Weight: 203 lb 3.2 oz (92.2 kg)     Physical Exam HENT:     Head: Normocephalic and atraumatic.     Mouth/Throat:     Pharynx: No oropharyngeal exudate.  Eyes:     Pupils: Pupils are equal, round, and reactive to light.  Cardiovascular:     Rate and Rhythm: Normal rate and regular rhythm.  Pulmonary:     Effort: No respiratory distress.     Breath sounds: No wheezing.  Abdominal:     General: Bowel sounds are normal. There is no distension.     Palpations: Abdomen is soft. There is no mass.     Tenderness: There is no abdominal tenderness. There is no guarding or rebound.  Musculoskeletal:        General: No tenderness. Normal range of  motion.     Cervical back: Normal range of motion and neck supple.  Skin:    General: Skin is warm.  Neurological:     Mental Status: She is alert and oriented to person, place, and time.  Psychiatric:        Mood and Affect: Affect normal.      LABORATORY DATA:  I have reviewed the data as listed Lab Results  Component Value Date   WBC 6.6 03/07/2023   HGB 12.5 03/07/2023  HCT 37.7 03/07/2023   MCV 92.0 03/07/2023   PLT 281 03/07/2023   Recent Labs    11/04/22 1440 01/29/23 2026 03/07/23 1522  NA 136 139 135  K 4.0 2.6* 3.8  CL 103 103 102  CO2 26 21* 25  GLUCOSE 125* 125* 108*  BUN 12 15 12   CREATININE 0.66 0.79 0.77  CALCIUM 8.5* 8.9 8.6*  GFRNONAA >60 >60 >60    RADIOGRAPHIC STUDIES: I have personally reviewed the radiological images as listed and agreed with the findings in the report. No results found.   ASSESSMENT & PLAN:   Iron deficiency # Iron def/ B12 def- gastric bypass/-B12 deficiency on B12 injection-July 2022-ferritin 6/iron deficiency-s/p IV iron infusion. POOR tolerance to oral iron.  # Proceed with maintenance Venofer- Today- Hb 12.5- HOLD venofer.   # B12 def- June 2023- 154 low-currently on  B12 injection- monthly [[facility]]  # RA-Dr.Patel [s/p arthro- sec to RA]-stable.  # History of recent hypokalemia/hypomagnesemia-defer to PCP.  # DISPOSITION: # HOLD venofer  # Follow up MD-6  months- labs- cbc/bmp; iron studies;ferritn; B12 levels; vit D 25-OH. possible venofer;Dr.B  All questions were answered. The patient knows to call the clinic with any problems, questions or concerns.   Earna Coder, MD 03/09/2023 3:42 PM

## 2023-03-09 ENCOUNTER — Encounter: Payer: Self-pay | Admitting: Internal Medicine

## 2023-05-14 ENCOUNTER — Emergency Department
Admission: EM | Admit: 2023-05-14 | Discharge: 2023-05-15 | Disposition: A | Discharge status: Home / Self Care | Attending: Emergency Medicine | Admitting: Emergency Medicine

## 2023-05-14 ENCOUNTER — Encounter: Payer: Self-pay | Admitting: Internal Medicine

## 2023-05-14 ENCOUNTER — Other Ambulatory Visit: Payer: Self-pay

## 2023-05-14 DIAGNOSIS — T782XXA Anaphylactic shock, unspecified, initial encounter: Secondary | ICD-10-CM | POA: Diagnosis not present

## 2023-05-14 DIAGNOSIS — R0602 Shortness of breath: Secondary | ICD-10-CM | POA: Diagnosis present

## 2023-05-14 DIAGNOSIS — J029 Acute pharyngitis, unspecified: Secondary | ICD-10-CM

## 2023-05-14 NOTE — ED Triage Notes (Signed)
 BIB EMS for a possible allergic reaction after she took a dose of amoxicillin  around 2300. Patient states she began experiencing shortness of breath and itching in her chest, abdomen, and feet. Denies any visible hives. Patient did take 0.3mg  of Epi and 25mg  of Benadryl  around 2320.

## 2023-05-15 LAB — GROUP A STREP BY PCR: Group A Strep by PCR: NOT DETECTED

## 2023-05-15 MED ORDER — EPINEPHRINE 0.3 MG/0.3ML IJ SOAJ: 0.3000 mg | INTRAMUSCULAR | 0 refills | Status: DC | PRN

## 2023-05-15 MED ORDER — SODIUM CHLORIDE 0.9 % IV BOLUS
1000.0000 mL | Freq: Once | INTRAVENOUS | Status: AC
  Administered 2023-05-15: 1000 mL via INTRAVENOUS

## 2023-05-15 MED ORDER — FAMOTIDINE IN NACL 20-0.9 MG/50ML-% IV SOLN
20.0000 mg | Freq: Once | INTRAVENOUS | Status: AC
  Administered 2023-05-15: 20 mg via INTRAVENOUS
  Filled 2023-05-15: qty 50

## 2023-05-15 MED ORDER — METHYLPREDNISOLONE SODIUM SUCC 125 MG IJ SOLR
125.0000 mg | Freq: Once | INTRAMUSCULAR | Status: AC
  Administered 2023-05-15: 125 mg via INTRAVENOUS
  Filled 2023-05-15: qty 2

## 2023-05-15 MED ORDER — PREDNISONE 50 MG PO TABS
ORAL_TABLET | ORAL | 0 refills | Status: DC
Start: 2023-05-15 — End: 2023-08-22

## 2023-05-15 MED ORDER — DIPHENHYDRAMINE HCL 50 MG/ML IJ SOLN
25.0000 mg | Freq: Once | INTRAMUSCULAR | Status: AC
  Administered 2023-05-15: 25 mg via INTRAVENOUS
  Filled 2023-05-15: qty 1

## 2023-05-15 NOTE — Discharge Instructions (Addendum)
 Your strep test was negative.  Lets hold off on antibiotics for now.  Do not take penicillins in the future given your allergic reaction today.  Take prednisone  as prescribed.  Thank you for choosing us  for your health care today!  Please see your primary doctor this week for a follow up appointment.   If you have any new, worsening, or unexpected symptoms call your doctor right away or come back to the emergency department for reevaluation.  It was my pleasure to care for you today.   Arron Large Margery Sheets, MD

## 2023-05-15 NOTE — ED Provider Notes (Signed)
 [facility] Provider Note    Event Date/Time   First MD Initiated Contact with Patient 05/14/23 2347     (approximate)   History   Allergic Reaction   HPI  Debbie Sosa is a 48 y.o. female   Here for allergic reaction.  She felt that her throat was swelling up and she had difficulty breathing after taking her second dose of amoxicillin  this evening that was prescribed for presumed strep pharyngitis.  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  She went to urgent care and they saw some erythema in her throat and had a negative strep test but put her on amoxicillin   She took 1 dose in the morning which did not cause symptoms but her evening dose immediately afterwards caused tightness and swelling in her throat and difficulty breathing.  She self administered an epinephrine  pen and called EMS.  She also took 25 mg of oral Benadryl .  She feels much better now and is completely asymptomatic.   External Medical Documents Reviewed: Office visit note from April 26 for sore throat      Physical Exam   Triage Vital Signs: ED Triage Vitals  Encounter Vitals Group     BP 05/14/23 2355 118/71     Systolic BP Percentile --      Diastolic BP Percentile --      Pulse Rate 05/14/23 2355 84     Resp 05/14/23 2355 18     Temp 05/15/23 0000 98.6 F (37 C)     Temp Source 05/15/23 0000 Oral     SpO2 05/14/23 2355 100 %     Weight --      Height --      Head Circumference --      Peak Flow --      Pain Score 05/14/23 2353 6     Pain Loc --      Pain Education --      Exclude from Growth Chart --     Most recent vital signs: Vitals:   05/14/23 2355 05/15/23 0000  BP: 118/71   Pulse: 84   Resp: 18   Temp:  98.6 F (37 C)  SpO2: 100%     General: Awake, no distress.  CV:  Good peripheral perfusion.  Resp:  Normal effort.  Abd:  No distention.  Other:  Awake alert comfortable with normal hemodynamics.  Soft  benign abdominal exam no wheezing to auscultation of lungs.  Posterior oropharynx appears slightly erythematous with no exudates, swelling, or masses.  She is breathing comfortably maintaining airway.   ED Results / Procedures / Treatments   Labs (all labs ordered are listed, but only abnormal results are displayed) Labs Reviewed  GROUP A STREP BY PCR     PROCEDURES:  Critical Care performed: No  Procedures   MEDICATIONS ORDERED IN ED: Medications  methylPREDNISolone  sodium succinate  (SOLU-MEDROL ) 125 mg/2 mL injection 125 mg (125 mg Intravenous Given 05/15/23 0036)  famotidine  (PEPCID ) IVPB 20 mg premix (20 mg Intravenous New Bag/Given 05/15/23 0036)  diphenhydrAMINE  (BENADRYL ) injection 25 mg (25 mg Intravenous Given 05/15/23 0036)  sodium chloride  0.9 % bolus 1,000 mL (1,000 mLs Intravenous New Bag/Given 05/15/23 0037)    IMPRESSION / MDM / ASSESSMENT AND PLAN / ED COURSE  I reviewed the triage vital signs and the nursing notes.  Patient's presentation is most consistent with acute presentation with potential threat to life or bodily function.  Differential diagnosis includes, but is not limited to, anaphylaxis, airway obstruction, strep pharyngitis viral pharyngitis   The patient is on the cardiac monitor to evaluate for evidence of arrhythmia and/or significant heart rate changes.  MDM:     Seems she had an allergic reaction anaphylactic reaction from her amoxicillin .  She self-administered epinephrine  and Benadryl  and now she feels completely better.  She will need an observation period after her epinephrine  and will top her off with some Solu-Medrol , another dose of 25 mg IV Benadryl  and IV famotidine .  She is not to take penicillins in the future.  She does not have exudates on my exam today and another strep test was negative.  Will defer antibiotics given no objective findings consistent with strep pharyngitis at this time.        FINAL CLINICAL IMPRESSION(S) / ED DIAGNOSES   Final diagnoses:  Anaphylaxis, initial encounter     Rx / DC Orders   ED Discharge Orders          Ordered    predniSONE  (DELTASONE ) 50 MG tablet        05/15/23 0006             Note:  This document was prepared using Dragon voice recognition software and may include unintentional dictation errors.    Buell Carmin, MD 05/15/23 [phone]

## 2023-06-21 DIAGNOSIS — M1612 Unilateral primary osteoarthritis, left hip: Secondary | ICD-10-CM | POA: Insufficient documentation

## 2023-07-08 ENCOUNTER — Other Ambulatory Visit: Payer: Self-pay

## 2023-07-08 ENCOUNTER — Emergency Department
Admission: EM | Admit: 2023-07-08 | Discharge: 2023-07-08 | Disposition: A | Discharge status: Home / Self Care | Attending: Emergency Medicine | Admitting: Emergency Medicine

## 2023-07-08 ENCOUNTER — Encounter: Payer: Self-pay | Admitting: Intensive Care

## 2023-07-08 ENCOUNTER — Encounter: Payer: Self-pay | Admitting: Internal Medicine

## 2023-07-08 ENCOUNTER — Emergency Department

## 2023-07-08 DIAGNOSIS — M79662 Pain in left lower leg: Secondary | ICD-10-CM | POA: Insufficient documentation

## 2023-07-08 DIAGNOSIS — M25552 Pain in left hip: Secondary | ICD-10-CM | POA: Diagnosis present

## 2023-07-08 MED ORDER — HYDROCODONE-ACETAMINOPHEN 5-325 MG PO TABS
1.0000 | ORAL_TABLET | Freq: Once | ORAL | Status: AC
  Administered 2023-07-08: 1 via ORAL
  Filled 2023-07-08: qty 1

## 2023-07-08 MED ORDER — HYDROCODONE-ACETAMINOPHEN 5-325 MG PO TABS: 1.0000 | ORAL_TABLET | ORAL | 0 refills | Status: DC | PRN

## 2023-07-08 NOTE — ED Notes (Signed)
 Pt reports when she was having US  done she felt the same tingling sensation she felt before her hip gave up this morning. PA made aware.

## 2023-07-08 NOTE — ED Provider Notes (Signed)
 [facility] Provider Note    Event Date/Time   First MD Initiated Contact with Patient 07/08/23 1135     (approximate)   History   Fall   HPI  Debbie Sosa is a 48 y.o. female with PMH of gastric bypass and left hip pain presents for evaluation of left hip pain.  Patient states that today she did not really fall, the pain in her left hip became so severe that she lowered herself to the ground.  She is also having pain in her left lower calf and back of the leg since Tuesday.    Patient is followed by orthopedics, last appointment was beginning of the month.  She had an MRI from November 2024 that showed a partial tear of the gluteus medius tendon.  Recent x-rays show degenerative changes and exam was concerning for labral tear.  Patient is a candidate for a hip replacement but had a steroid injection at the beginning of June so we will need to wait 3 months.  Patient's greatest concern is the sudden increase in her pain today.  She has taken Tylenol , used ice and heat for pain control.  She takes the occasional ibuprofen but nothing consistent due to her history of gastric bypass.      Physical Exam   Triage Vital Signs: ED Triage Vitals  Encounter Vitals Group     BP 07/08/23 1122 110/75     Girls Systolic BP Percentile --      Girls Diastolic BP Percentile --      Boys Systolic BP Percentile --      Boys Diastolic BP Percentile --      Pulse Rate 07/08/23 1122 76     Resp 07/08/23 1122 16     Temp 07/08/23 1122 98 F (36.7 C)     Temp Source 07/08/23 1122 Oral     SpO2 07/08/23 1122 96 %     Weight 07/08/23 1119 200 lb (90.7 kg)     Height 07/08/23 1119 5' 1 (1.549 m)     Head Circumference --      Peak Flow --      Pain Score 07/08/23 1119 10     Pain Loc --      Pain Education --      Exclude from Growth Chart --     Most recent vital signs: Vitals:   07/08/23 1122  BP: 110/75  Pulse: 76  Resp: 16  Temp: 98 F (36.7 C)   SpO2: 96%    General: Awake, no distress.  CV:  Good peripheral perfusion.  Resp:  Normal effort.  Abd:  No distention.  Other:  TTP over the groin, lateral hip and anterior thigh. ROM is limited due to pain, but patient is able to walk using a cane.   ED Results / Procedures / Treatments   Labs (all labs ordered are listed, but only abnormal results are displayed) Labs Reviewed - No data to display   RADIOLOGY  Left lower extremity ultrasound obtained, I interpreted the images as well as reviewed the radiologist report which was negative for DVT.  PROCEDURES:  Critical Care performed: No  Procedures   MEDICATIONS ORDERED IN ED: Medications  HYDROcodone -acetaminophen  (NORCO/VICODIN) 5-325 MG per tablet 1 tablet (1 tablet Oral Given 07/08/23 1233)     IMPRESSION / MDM / ASSESSMENT AND PLAN / ED COURSE  I reviewed the triage vital signs and the nursing notes.  48 year old female presents for evaluation of left hip and left calf pain. VSS and patient NAD on exam as long as she is not moving.  Patient is quite uncomfortable with any movement.  Differential diagnosis includes, but is not limited to, acute on chronic left hip pain, hip fracture, muscle strain, DVT.   Patient's presentation is most consistent with acute complicated illness / injury requiring diagnostic workup.  Will obtain left lower extremity ultrasound to rule out DVT given patient's calf pain and increased risk due to history of pulmonary embolism. Patient denies chest pain and shortness of breath. Ultrasound was negative for DVT.   Will give patient pain medication while in the emergency department and see if she can walk.  If patient is unable to bear weight and walk we will get a hip x-ray.  Patient was able to walk using her cane. Does appear to be in quite a bit of pain while walking though. Since she has not had any new trauma to the hip, will hold off on imaging for now.    Suspect that this is an acute worsening of patient's chronic pain. Will start her on a short course of pain medication. Discussed OTC options, ice and heat. Recommended patient follow up with orthopedics. She was given a walker.   Patient voiced understanding, all questions were answered and she was stable at discharge.     FINAL CLINICAL IMPRESSION(S) / ED DIAGNOSES   Final diagnoses:  Pain of left hip     Rx / DC Orders   ED Discharge Orders          Ordered    HYDROcodone -acetaminophen  (NORCO/VICODIN) 5-325 MG tablet  Every 4 hours PRN        07/08/23 1355             Note:  This document was prepared using Dragon voice recognition software and may include unintentional dictation errors.   Cleaster Tinnie LABOR, PA-C 07/08/23 1406    Dorothyann Drivers, MD 07/08/23 [phone]

## 2023-07-08 NOTE — Discharge Instructions (Addendum)
 The ultrasound was negative for DVT.   Please schedule a follow up appointment with Dr. Lorelle or your orthopedic provider at [facility].  I have sent a strong pain medication called Norco to the pharmacy.  This medication can be taken every 4-6 hours as needed for severe or breakthrough pain.  This medication can cause dependency so only take if you are unable to control your pain with other medications.  It will make you sleepy so do not drive or operate heavy machinery after taking it.  Do not drink alcohol while taking this medication.  This medication can also cause constipation so please take an over-the-counter stool softener like MiraLAX  or Colace while taking it.  This medication contains both hydrocodone  and acetaminophen .  Do not take Tylenol  at the same time.  You can take the Norco or Tylenol  but not both.  You can take 650 mg of Tylenol  every 6 hours as needed for mild to moderate pain. You can use ice, heat, muscle creams and other topical pain relievers as well.

## 2023-07-08 NOTE — ED Notes (Signed)
 Pt ambulatory to restroom. Pt using a cane. Reports pain medication dis help some with pain

## 2023-07-08 NOTE — ED Notes (Signed)
US Tech at bedside.

## 2023-07-08 NOTE — ED Notes (Signed)
 Pt verbalizes understanding of discharge instructions.

## 2023-07-08 NOTE — ED Notes (Signed)
 Provided pt with some Arlyss crackers

## 2023-07-08 NOTE — ED Triage Notes (Signed)
 Patient reports left hip went out on her this AM and lowered herself to ground. Reports since the fall, her left lower calf and hamstring is tight and painful. Reports feeling a pulsing sensation in left calf  Patient reports she needs left hip replacement

## 2023-08-04 ENCOUNTER — Other Ambulatory Visit: Payer: Self-pay | Admitting: Orthopedic Surgery

## 2023-08-04 DIAGNOSIS — M5416 Radiculopathy, lumbar region: Secondary | ICD-10-CM

## 2023-08-08 ENCOUNTER — Ambulatory Visit
Admission: RE | Admit: 2023-08-08 | Discharge: 2023-08-08 | Disposition: A | Discharge status: Home / Self Care | Source: Ambulatory Visit | Attending: Orthopedic Surgery | Admitting: Orthopedic Surgery

## 2023-08-08 DIAGNOSIS — M5416 Radiculopathy, lumbar region: Secondary | ICD-10-CM | POA: Diagnosis present

## 2023-08-22 ENCOUNTER — Other Ambulatory Visit: Payer: Self-pay

## 2023-08-22 ENCOUNTER — Observation Stay

## 2023-08-22 ENCOUNTER — Observation Stay
Admission: EM | Admit: 2023-08-22 | Discharge: 2023-08-23 | Disposition: A | Discharge status: Home / Self Care | Attending: Emergency Medicine | Admitting: Emergency Medicine

## 2023-08-22 ENCOUNTER — Emergency Department

## 2023-08-22 DIAGNOSIS — G8929 Other chronic pain: Secondary | ICD-10-CM | POA: Diagnosis not present

## 2023-08-22 DIAGNOSIS — M5416 Radiculopathy, lumbar region: Secondary | ICD-10-CM | POA: Diagnosis not present

## 2023-08-22 DIAGNOSIS — Z8616 Personal history of COVID-19: Secondary | ICD-10-CM | POA: Diagnosis not present

## 2023-08-22 DIAGNOSIS — Z91040 Latex allergy status: Secondary | ICD-10-CM | POA: Insufficient documentation

## 2023-08-22 DIAGNOSIS — Z86718 Personal history of other venous thrombosis and embolism: Secondary | ICD-10-CM | POA: Diagnosis not present

## 2023-08-22 DIAGNOSIS — M25552 Pain in left hip: Principal | ICD-10-CM | POA: Diagnosis present

## 2023-08-22 DIAGNOSIS — J45909 Unspecified asthma, uncomplicated: Secondary | ICD-10-CM | POA: Diagnosis not present

## 2023-08-22 LAB — CBC WITH DIFFERENTIAL/PLATELET
Abs Immature Granulocytes: 0.07 K/uL (ref 0.00–0.07)
Basophils Absolute: 0 K/uL (ref 0.0–0.1)
Basophils Relative: 0 %
Eosinophils Absolute: 0 K/uL (ref 0.0–0.5)
Eosinophils Relative: 0 %
HCT: 39.4 % (ref 36.0–46.0)
Hemoglobin: 13 g/dL (ref 12.0–15.0)
Immature Granulocytes: 1 %
Lymphocytes Relative: 14 %
Lymphs Abs: 1.7 K/uL (ref 0.7–4.0)
MCH: 30.3 pg (ref 26.0–34.0)
MCHC: 33 g/dL (ref 30.0–36.0)
MCV: 91.8 fL (ref 80.0–100.0)
Monocytes Absolute: 0.9 K/uL (ref 0.1–1.0)
Monocytes Relative: 8 %
Neutro Abs: 9.3 K/uL — ABNORMAL HIGH (ref 1.7–7.7)
Neutrophils Relative %: 77 %
Platelets: 311 K/uL (ref 150–400)
RBC: 4.29 MIL/uL (ref 3.87–5.11)
RDW: 13.2 % (ref 11.5–15.5)
WBC: 12 K/uL — ABNORMAL HIGH (ref 4.0–10.5)
nRBC: 0 % (ref 0.0–0.2)

## 2023-08-22 LAB — BASIC METABOLIC PANEL WITH GFR
Anion gap: 11 (ref 5–15)
BUN: 12 mg/dL (ref 6–20)
CO2: 20 mmol/L — ABNORMAL LOW (ref 22–32)
Calcium: 8.8 mg/dL — ABNORMAL LOW (ref 8.9–10.3)
Chloride: 107 mmol/L (ref 98–111)
Creatinine, Ser: 0.9 mg/dL (ref 0.44–1.00)
GFR, Estimated: >60 mL/min (ref >60)
Glucose, Bld: 128 mg/dL — ABNORMAL HIGH (ref 70–99)
Potassium: 3.5 mmol/L (ref 3.5–5.1)
Sodium: 138 mmol/L (ref 135–145)

## 2023-08-22 MED ORDER — FENTANYL CITRATE PF 50 MCG/ML IJ SOSY
50.0000 ug | PREFILLED_SYRINGE | Freq: Once | INTRAMUSCULAR | Status: AC
  Administered 2023-08-22: 50 ug via INTRAVENOUS
  Filled 2023-08-22: qty 1

## 2023-08-22 MED ORDER — METHYLPREDNISOLONE SODIUM SUCC 125 MG IJ SOLR
125.0000 mg | Freq: Once | INTRAMUSCULAR | Status: DC
  Filled 2023-08-22: qty 2

## 2023-08-22 MED ORDER — ENOXAPARIN SODIUM 60 MG/0.6ML IJ SOSY
0.5000 mg/kg | PREFILLED_SYRINGE | INTRAMUSCULAR | Status: DC
  Administered 2023-08-22: 45 mg via SUBCUTANEOUS
  Filled 2023-08-22: qty 0.6

## 2023-08-22 MED ORDER — PANTOPRAZOLE SODIUM 40 MG PO TBEC
40.0000 mg | DELAYED_RELEASE_TABLET | Freq: Every day | ORAL | Status: DC
  Administered 2023-08-23: 40 mg via ORAL
  Filled 2023-08-22: qty 1

## 2023-08-22 MED ORDER — METHOCARBAMOL 1000 MG/10ML IJ SOLN
500.0000 mg | Freq: Once | INTRAMUSCULAR | Status: AC
  Administered 2023-08-22: 500 mg via INTRAVENOUS
  Filled 2023-08-22: qty 5

## 2023-08-22 MED ORDER — DIPHENHYDRAMINE HCL 12.5 MG/5ML PO ELIX
12.5000 mg | ORAL_SOLUTION | Freq: Once | ORAL | Status: AC
  Administered 2023-08-22: 12.5 mg via ORAL
  Filled 2023-08-22: qty 5

## 2023-08-22 MED ORDER — SODIUM CHLORIDE 0.9 % IV SOLN: 25.0000 mg | INTRAVENOUS | Status: DC | PRN

## 2023-08-22 MED ORDER — LIDOCAINE 5 % EX PTCH
1.0000 | MEDICATED_PATCH | Freq: Once | CUTANEOUS | Status: AC
  Administered 2023-08-22: 1 via TRANSDERMAL
  Filled 2023-08-22: qty 1

## 2023-08-22 MED ORDER — TRAMADOL HCL 50 MG PO TABS
50.0000 mg | ORAL_TABLET | Freq: Once | ORAL | Status: AC
  Administered 2023-08-22: 50 mg via ORAL
  Filled 2023-08-22: qty 1

## 2023-08-22 MED ORDER — ONDANSETRON HCL 4 MG/2ML IJ SOLN: 4.0000 mg | Freq: Four times a day (QID) | INTRAMUSCULAR | Status: DC | PRN

## 2023-08-22 MED ORDER — DIPHENHYDRAMINE HCL 50 MG/ML IJ SOLN
12.5000 mg | Freq: Once | INTRAMUSCULAR | Status: AC
  Administered 2023-08-22: 12.5 mg via INTRAVENOUS
  Filled 2023-08-22: qty 1

## 2023-08-22 MED ORDER — HYDROCODONE-ACETAMINOPHEN 5-325 MG PO TABS
2.0000 | ORAL_TABLET | ORAL | Status: DC | PRN
  Administered 2023-08-22 – 2023-08-23 (×2): 2 via ORAL
  Filled 2023-08-22 (×2): qty 2

## 2023-08-22 MED ORDER — FENTANYL CITRATE PF 50 MCG/ML IJ SOSY
12.5000 ug | PREFILLED_SYRINGE | INTRAMUSCULAR | Status: DC | PRN
  Administered 2023-08-23: 50 ug via INTRAVENOUS
  Filled 2023-08-22: qty 1

## 2023-08-22 MED ORDER — ONDANSETRON HCL 4 MG PO TABS: 4.0000 mg | ORAL_TABLET | Freq: Four times a day (QID) | ORAL | Status: DC | PRN

## 2023-08-22 MED ORDER — KETOROLAC TROMETHAMINE 15 MG/ML IJ SOLN
15.0000 mg | Freq: Once | INTRAMUSCULAR | Status: AC
  Administered 2023-08-22: 15 mg via INTRAVENOUS
  Filled 2023-08-22: qty 1

## 2023-08-22 MED ORDER — SENNOSIDES-DOCUSATE SODIUM 8.6-50 MG PO TABS
1.0000 | ORAL_TABLET | Freq: Every evening | ORAL | Status: DC | PRN
Start: 2023-08-22 — End: 2023-08-23
  Administered 2023-08-23: 1 via ORAL
  Filled 2023-08-22: qty 1

## 2023-08-22 MED ORDER — HYDROCODONE-ACETAMINOPHEN 5-325 MG PO TABS: 1.0000 | ORAL_TABLET | ORAL | Status: DC | PRN

## 2023-08-22 NOTE — H&P (Signed)
 History and Physical    Patient: Debbie Sosa FMW:[id number] DOB: Mar 28, 1975 DOA: 08/22/2023 DOS: the patient was seen and examined on 08/22/2023 PCP: Auston Reyes BIRCH, MD  Patient coming from: Home  Chief Complaint:  Chief Complaint  Patient presents with   Hip Pain   HPI: Debbie Sosa is a 48 y.o. female with medical history significant of Chronic low back pain bilateral lumbar radiculitis, Left greater trochanteric bursitis, chronic left hip pain and lumbar radiculopathy status post epidural injection on 08/21/2023 pending a left total hip arthroplasty in September 2025 presents emergency department for evaluation of constant, nonradiating left groin pain that started when she reached above her head earlier today to grab an empty bag.  Pain is exacerbated with any movement of the hip, somewhat relieved with lying flat.  She denies associated numbness or tingling of the leg, back pain, saddle anesthesia, bladder or bowel incontinence.  In the emergency department, she is hemodynamically stable.  Slight leukocytosis of 12. CT the left hip without acute osseous abnormality. She was given Toradol  and fentanyl  at the minimal relief of the pain. Orthopedic surgery recommended MRI of the hip and pelvis which is currently pending.  Hospitalist is consulted for admission for pain control.   Review of Systems: Review of Systems  Constitutional:  Negative for chills, fever, malaise/fatigue and weight loss.  Eyes:  Negative for photophobia, pain and discharge.  Respiratory:  Negative for cough and shortness of breath.   Cardiovascular:  Negative for chest pain, palpitations and leg swelling.  Gastrointestinal:  Negative for abdominal pain, constipation, diarrhea, nausea and vomiting.  Genitourinary:  Negative for dysuria and frequency.  Musculoskeletal:  Positive for joint pain. Negative for back pain, falls, myalgias and neck pain.  Skin:  Negative for itching and rash.  Neurological:   Negative for dizziness, tingling, speech change, focal weakness, seizures, loss of consciousness and weakness.    Past Medical History:  Diagnosis Date   Anemia    Antiphospholipid antibody positive    Asthma    Chronic venous insufficiency    COVID-19 2020   DDD (degenerative disc disease), lumbar    GERD (gastroesophageal reflux disease)    Headache    h/o migraines   History of hiatal hernia    History of kidney stones    Iron  deficiency    recieves iron  infusions every 3 months at the cancer center   Lymphedema    bil legs-sees [facility]   Nausea and vomiting 10/14/2015   Obesity    Pneumonia    Pulmonary embolism (HCC)    after 2nd c-section   Sleep apnea    had gastric bypass and does not need to use cpap since surgery   Vitamin B12 deficiency    Past Surgical History:  Procedure Laterality Date   BREAST BIOPSY Left 2006   benign/clip   BREAST BIOPSY Right 2015   benign/clip   CESAREAN SECTION     CESAREAN SECTION     CHOLECYSTECTOMY     COLONOSCOPY     COLONOSCOPY WITH PROPOFOL  N/A 10/31/2018   Procedure: COLONOSCOPY WITH PROPOFOL ;  Surgeon: Therisa Bi, MD;  Location: [facility] ENDOSCOPY;  Service: Gastroenterology;  Laterality: N/A;   ESOPHAGOGASTRODUODENOSCOPY (EGD) WITH PROPOFOL  N/A 10/04/2016   Procedure: ESOPHAGOGASTRODUODENOSCOPY (EGD) WITH PROPOFOL ;  Surgeon: Therisa Bi, MD;  Location: [facility] ENDOSCOPY;  Service: Gastroenterology;  Laterality: N/A;   ESOPHAGOGASTRODUODENOSCOPY (EGD) WITH PROPOFOL  N/A 04/14/2017   Procedure: ESOPHAGOGASTRODUODENOSCOPY (EGD) WITH PROPOFOL ;  Surgeon: Therisa Bi, MD;  Location: [facility] ENDOSCOPY;  Service: Gastroenterology;  Laterality: N/A;   ESOPHAGOGASTRODUODENOSCOPY (EGD) WITH PROPOFOL  N/A 10/31/2018   Procedure: ESOPHAGOGASTRODUODENOSCOPY (EGD) WITH PROPOFOL ;  Surgeon: Therisa Bi, MD;  Location: [facility] ENDOSCOPY;  Service: Gastroenterology;  Laterality: N/A;   ESOPHAGOGASTRODUODENOSCOPY (EGD) WITH PROPOFOL  N/A 06/28/2019    Procedure: ESOPHAGOGASTRODUODENOSCOPY (EGD) WITH PROPOFOL ;  Surgeon: Therisa Bi, MD;  Location: [facility] ENDOSCOPY;  Service: Gastroenterology;  Laterality: N/A;   ESOPHAGOGASTRODUODENOSCOPY (EGD) WITH PROPOFOL  N/A 02/10/2021   Procedure: ESOPHAGOGASTRODUODENOSCOPY (EGD) WITH PROPOFOL ;  Surgeon: Therisa Bi, MD;  Location: [facility] ENDOSCOPY;  Service: Gastroenterology;  Laterality: N/A;   GASTRIC BYPASS     01/2015   HYSTEROSCOPY WITH D & C N/A 09/17/2021   Procedure: DILATATION AND CURETTAGE /HYSTEROSCOPY;  Surgeon: Leonce Garnette BIRCH, MD;  Location: [facility] ORS;  Service: Gynecology;  Laterality: N/A;   INTRAUTERINE DEVICE (IUD) INSERTION N/A 09/17/2021   Procedure: INTRAUTERINE DEVICE (IUD) INSERTION - MIRENA ;  Surgeon: Leonce Garnette BIRCH, MD;  Location: [facility] ORS;  Service: Gynecology;  Laterality: N/A;   IUD REMOVAL N/A 09/17/2021   Procedure: INTRAUTERINE DEVICE (IUD) REMOVAL;  Surgeon: Leonce Garnette BIRCH, MD;  Location: [facility] ORS;  Service: Gynecology;  Laterality: N/A;   KNEE SURGERY Right    NASAL SINUS SURGERY     PLANTAR FASCIA SURGERY     x 3   REDUCTION MAMMAPLASTY Bilateral 2005   Social History:  reports that she has never smoked. She has never used smokeless tobacco. She reports that she does not drink alcohol and does not use drugs.  Allergies  Allergen Reactions   Amoxicillin  Anaphylaxis    Throat swelling   Avocado Anaphylaxis   Cephalexin  Hives and Rash   Fexofenadine Shortness Of Breath   Iodinated Contrast Media Hives   Ketorolac  Hives   Morphine Anaphylaxis   Penicillins Swelling    Throat swelling    Promethazine Hives   Diflucan  [Fluconazole ] Rash   Apple    Cetirizine Other (See Comments)    Skin eruptures   Diclofenac  Sodium Hives   Doxycycline Other (See Comments)   Latex     With condoms   Other Hives    Pain medicine that starts with an L   Tramadol  Hives   Hepatitis B Virus Vaccines Rash   Misc. Sulfonamide Containing Compounds Rash   Morphine And Codeine  Rash   Naproxen Rash   Oxycodone -Acetaminophen  Itching   Pyridium [Phenazopyridine Hcl] Rash   Sulfa Antibiotics Rash   Sulfasalazine Rash   Vioxx [Rofecoxib] Rash    Family History  Problem Relation Age of Onset   Breast cancer Mother 20   Breast cancer Cousin 30   Breast cancer Cousin 45   Asthma Father    Asthma Sister    Asthma Child     Prior to Admission medications   Medication Sig Start Date End Date Taking? Authorizing Provider  albuterol  (VENTOLIN  HFA) 108 (90 Base) MCG/ACT inhaler Inhale 1-2 puffs INH Q4-6hr prn for chest tightness, cough, wheezing, SOB/DOE. 01/10/19  Yes Christopher Savannah, PA-C  cyanocobalamin  (,VITAMIN B-12,) 1000 MCG/ML injection Inject 1,000 mcg into the muscle once a week. 01/13/20  Yes [provider]  EPINEPHrine  (EPIPEN  2-PAK) 0.3 mg/0.3 mL IJ SOAJ injection Inject 0.3 mLs (0.3 mg total) into the muscle as needed for anaphylaxis. 04/16/19  Yes Meade Verdon RAMAN, MD  famotidine  (PEPCID ) 20 MG tablet Take 1 tablet (20 mg total) by mouth 2 (two) times daily. 07/18/22 08/22/23 Yes [place], PA-C  HYDROcodone -acetaminophen  (NORCO/VICODIN) 5-325 MG tablet Take  1 tablet by mouth every 4 (four) hours as needed. 07/08/23 07/07/24 Yes Dougherty, Lauren A, PA-C  levocetirizine (XYZAL) 5 MG tablet Take 5 mg by mouth every morning.   Yes [provider]  levonorgestrel  (MIRENA ) 20 MCG/DAY IUD by Intrauterine route.   Yes [provider]  montelukast  (SINGULAIR ) 10 MG tablet    Yes [provider]  predniSONE  (DELTASONE ) 10 MG tablet SMARTSIG:- Tablet(s) By Mouth - 05/29/23  Yes [provider]  pregabalin (LYRICA) 25 MG capsule Take 1 capsule p.o. nightly for 4-5 nights, then increase to 1 capsule p.o. twice daily for 4 to 5 days, then increase to 1 capsule p.o. 3 times daily 08/04/23  Yes [provider]  Vitamin D , Ergocalciferol , (DRISDOL ) 1.25 MG (50000 UT) CAPS capsule Take 50,000 Units by mouth once a week. Mondays  11/28/18  Yes [provider]  esomeprazole  (NEXIUM ) 40 MG capsule Take 1 capsule (40 mg total) by mouth daily at 12 noon. 07/18/22 07/13/23  [place], PA-C  traZODone (DESYREL) 50 MG tablet Take by mouth. Patient not taking: Reported on 08/22/2023 02/24/23 02/24/24  [provider]  pantoprazole  (PROTONIX ) 40 MG tablet Take 1 tablet (40 mg total) by mouth daily. 04/16/19 12/16/19  Meade Verdon RAMAN, MD    Physical Exam: Vitals:   08/22/23 0954 08/22/23 1004 08/22/23 1621 08/22/23 1728  BP: 118/78  121/71   Pulse: 60  61   Resp: 17  17   Temp: 98.1 F (36.7 C)  98.2 F (36.8 C) 98.3 F (36.8 C)  TempSrc: Oral  Oral Oral  SpO2: 98%  98%   Weight:  90.7 kg    Height:  5' 1 (1.549 m)     Physical Exam Vitals and nursing note reviewed.  Constitutional:      General: She is not in acute distress.    Appearance: She is not ill-appearing.  HENT:     Head: Normocephalic and atraumatic.     Mouth/Throat:     Mouth: Mucous membranes are moist.  Eyes:     General: No scleral icterus.    Extraocular Movements: Extraocular movements intact.     Pupils: Pupils are equal, round, and reactive to light.  Cardiovascular:     Rate and Rhythm: Normal rate and regular rhythm.  Pulmonary:     Effort: Pulmonary effort is normal. No respiratory distress.     Breath sounds: Normal breath sounds.  Abdominal:     General: Abdomen is flat. Bowel sounds are normal. There is no distension.     Palpations: Abdomen is soft.     Tenderness: There is no abdominal tenderness.  Musculoskeletal:        General: No deformity or signs of injury.     Cervical back: Normal range of motion and neck supple.     Comments: Left hip range of motion decreased due to pain Negative straight leg test Lower extremity strength sensation intact bilaterally Bilateral feet warm, distal pulses equal intact  Skin:    General: Skin is warm and dry.     Capillary Refill: Capillary refill takes less than 2  seconds.  Neurological:     General: No focal deficit present.     Mental Status: She is alert and oriented to person, place, and time.     Motor: No weakness.  Psychiatric:        Mood and Affect: Mood normal.        Behavior: Behavior normal.  Data Reviewed:    Labs on Admission: I have personally reviewed following labs and imaging studies  CBC: Recent Labs  Lab 08/22/23 1641  WBC 12.0*  NEUTROABS 9.3*  HGB 13.0  HCT 39.4  MCV 91.8  PLT 311   Basic Metabolic Panel: Recent Labs  Lab 08/22/23 1641  NA 138  K 3.5  CL 107  CO2 20*  GLUCOSE 128*  BUN 12  CREATININE 0.90  CALCIUM 8.8*   GFR: Estimated Creatinine Clearance: 78.4 mL/min (by C-G formula based on SCr of 0.9 mg/dL). Liver Function Tests: No results for input(s): AST, ALT, ALKPHOS, BILITOT, PROT, ALBUMIN in the last 168 hours. No results for input(s): LIPASE, AMYLASE in the last 168 hours. No results for input(s): AMMONIA in the last 168 hours. Coagulation Profile: No results for input(s): INR, PROTIME in the last 168 hours. Cardiac Enzymes: No results for input(s): CKTOTAL, CKMB, CKMBINDEX, TROPONINI in the last 168 hours. BNP (last 3 results) No results for input(s): PROBNP in the last 8760 hours. HbA1C: No results for input(s): HGBA1C in the last 72 hours. CBG: No results for input(s): GLUCAP in the last 168 hours. Lipid Profile: No results for input(s): CHOL, HDL, LDLCALC, TRIG, CHOLHDL, LDLDIRECT in the last 72 hours. Thyroid Function Tests: No results for input(s): TSH, T4TOTAL, FREET4, T3FREE, THYROIDAB in the last 72 hours. Anemia Panel: No results for input(s): VITAMINB12, FOLATE, FERRITIN, TIBC, IRON , RETICCTPCT in the last 72 hours. Urine analysis:    Component Value Date/Time   COLORURINE YELLOW (A) 10/02/2016 1710   APPEARANCEUR HAZY (A) 10/02/2016 1710   APPEARANCEUR Turbid 08/23/2013 1720   LABSPEC 1.018  10/02/2016 1710   LABSPEC 1.008 08/23/2013 1720   PHURINE 6.0 10/02/2016 1710   GLUCOSEU 50 (A) 10/02/2016 1710   GLUCOSEU Negative 08/23/2013 1720   HGBUR SMALL (A) 10/02/2016 1710   BILIRUBINUR NEGATIVE 10/02/2016 1710   BILIRUBINUR Negative 08/23/2013 1720   KETONESUR 5 (A) 10/02/2016 1710   PROTEINUR NEGATIVE 10/02/2016 1710   NITRITE NEGATIVE 10/02/2016 1710   LEUKOCYTESUR NEGATIVE 10/02/2016 1710   LEUKOCYTESUR 3+ 08/23/2013 1720    Radiological Exams on Admission: CT Hip Left Wo Contrast Result Date: 08/22/2023 CLINICAL DATA:  Acute on chronic left hip pain. EXAM: CT OF THE LEFT HIP WITHOUT CONTRAST TECHNIQUE: Multidetector CT imaging of the left hip was performed according to the standard protocol. Multiplanar CT image reconstructions were also generated. RADIATION DOSE REDUCTION: This exam was performed according to the departmental dose-optimization program which includes automated exposure control, adjustment of the mA and/or kV according to patient size and/or use of iterative reconstruction technique. COMPARISON:  Left hip radiographs dated 08/22/2023. MRI of the left hip dated 11/19/2022. FINDINGS: Bones/Joint/Cartilage No acute fracture or dislocation. The left femoral head is seated within the acetabulum. Severe superior joint space narrowing of the left hip with associated acetabular and humeral head osteophytosis, and subchondral sclerosis. Left greater trochanteric enthesopathy. Visualized left sacroiliac joint and pubic symphysis are anatomically aligned. No sizable joint effusion. Ligaments Ligaments are suboptimally evaluated by CT. Muscles and Tendons Muscles are normal. No muscle atrophy. No intramuscular fluid collection or hematoma. Soft tissue No fluid collection or hematoma. No soft tissue mass. Partially visualized IUD in place. IMPRESSION: 1. No acute osseous abnormality. 2. Severe superior joint space narrowing of the left hip with osteophytosis and subchondral  sclerosis. Electronically Signed   By: Harrietta Sherry M.D.   On: 08/22/2023 14:40   DG Hip Unilat W or Wo Pelvis 2-3 Views Left  Result Date: 08/22/2023 EXAM: 2 or 3 VIEW(S) XRAY OF THE LEFT HIP 08/22/2023 11:25:00 AM COMPARISON: 12/16/2019, number 2 [phone] CLINICAL HISTORY: Pain unable to bear weight. Patient complains of left hip pain; unable to bear weight. FINDINGS: BONES AND JOINTS: Worsening joint space loss of the left hip with near bone-on-bone articulation. Mild joint space loss of the right hip. Degenerative disc disease of the visualized lumbar spine. SOFT TISSUES: Intruterine device overlying the midline pelvis. Multiple pelvic phleboliths. IMPRESSION: 1. Progressive joint space loss of the left hip with near bone-on-bone articulation, consistent with worsening osteoarthritis. Electronically signed by: Rogelia Myers MD 08/22/2023 12:34 PM EDT RP Workstation: [id number]       Assessment and Plan: No notes have been filed under this hospital service. Service: Hospitalist   48 y.o. female with medical history significant of Chronic low back pain bilateral lumbar radiculitis, Left greater trochanteric bursitis, left hip osteoarthritis and lumbar radiculopathy   pending a left total hip arthroplasty in September 2025 admitted for acute on chronic localized  left hip pain that started yesterday after reaching above her head.  Pain refractory to fentanyl , Toradol , tramadol , and Robaxin  given in the ED  Acute on chronic left hip pain  Left hip o/a  - MRI Lhip/pelvis pending per Orthopedic Surgery recommendations. No red flag symptoms suggestive of spinal pathology  - WBAT, neurovascular checks  - multiple drug allergies, continue norco, fentanyl , benadryl  - solumedrol x1, assess response tomorrow  - PT once pain adequately managed  - Orthopedic Surgery input appreciated   Lumbar radiculopathy - Status post epidural injection 08/21/23, with adequate response.    Lovenox   Regular diet  No  ivf  Monitor/replace electrolytes   Advance Care Planning:   Code Status: Prior discussed with patient at time of admission  Consults: Orthopedic Surgery     Severity of Illness: The appropriate patient status for this patient is OBSERVATION. Observation status is judged to be reasonable and necessary in order to provide the required intensity of service to ensure the patient's safety. The patient's presenting symptoms, physical exam findings, and initial radiographic and laboratory data in the context of their medical condition is felt to place them at decreased risk for further clinical deterioration. Furthermore, it is anticipated that the patient will be medically stable for discharge from the hospital within 2 midnights of admission.   Author: Daved JAYSON Pump, DO 08/22/2023 6:24 PM  For on call review [email or web address].

## 2023-08-22 NOTE — ED Notes (Signed)
 Pt transported upstairs by Alan (tech). Family member assisted upstairs via WC by nurse, Donny, as well. Floor contacted by this nurse prior to pt transport upstairs.

## 2023-08-22 NOTE — ED Provider Notes (Signed)
 [facility] Provider Note    Event Date/Time   First MD Initiated Contact with Patient 08/22/23 1020     (approximate)   History   Hip Pain   HPI  Debbie Sosa is a 48 y.o. female history of chronic left hip pain who already has surgery scheduled due to it being bone-on-bone, also has history of B12 deficiency, asthma, lymphedema, PE, degenerative disc disease in the lumbar spine presents emergency department complaining of left hip pain.  Patient had an injection in her spine yesterday.  States did not lift anything.  Reached to grab a bag off of a hook when she had severe pain going into the left hip.  Was then unable to bear weight.  Has been unable to bear weight since the incident.  No numbness or tingling.  Patient is followed by [facility] clinic.  Denies head injury.      Physical Exam   Triage Vital Signs: ED Triage Vitals  Encounter Vitals Group     BP 08/22/23 0954 118/78     Girls Systolic BP Percentile --      Girls Diastolic BP Percentile --      Boys Systolic BP Percentile --      Boys Diastolic BP Percentile --      Pulse Rate 08/22/23 0954 60     Resp 08/22/23 0954 17     Temp 08/22/23 0954 98.1 F (36.7 C)     Temp Source 08/22/23 0954 Oral     SpO2 08/22/23 0954 98 %     Weight 08/22/23 1004 200 lb (90.7 kg)     Height 08/22/23 1004 5' 1 (1.549 m)     Head Circumference --      Peak Flow --      Pain Score 08/22/23 1004 10     Pain Loc --      Pain Education --      Exclude from Growth Chart --     Most recent vital signs: Vitals:   08/22/23 1621 08/22/23 1728  BP: 121/71   Pulse: 61   Resp: 17   Temp: 98.2 F (36.8 C) 98.3 F (36.8 C)  SpO2: 98%      General: Awake, no distress.   CV:  Good peripheral perfusion Resp:  Normal effort. Abd:  No distention.   Other:  Left hip tender anteriorly and laterally, decreased range of motion secondary discomfort, neurovascular intact   ED Results / Procedures /  Treatments   Labs (all labs ordered are listed, but only abnormal results are displayed) Labs Reviewed  BASIC METABOLIC PANEL WITH GFR - Abnormal; Notable for the following components:      Result Value   CO2 20 (*)    Glucose, Bld 128 (*)    Calcium 8.8 (*)    All other components within normal limits  CBC WITH DIFFERENTIAL/PLATELET - Abnormal; Notable for the following components:   WBC 12.0 (*)    Neutro Abs 9.3 (*)    All other components within normal limits     EKG     RADIOLOGY X-ray left hip and pelvis    PROCEDURES:   Procedures  Critical Care:  no Chief Complaint  Patient presents with   Hip Pain      MEDICATIONS ORDERED IN ED: Medications  lidocaine  (LIDODERM ) 5 % 1 patch (1 patch Transdermal Patch Applied 08/22/23 1530)  traMADol  (ULTRAM ) tablet 50 mg (50 mg Oral Given 08/22/23 1106)  diphenhydrAMINE  (  BENADRYL ) 12.5 MG/5ML elixir 12.5 mg (12.5 mg Oral Given 08/22/23 1106)  fentaNYL  (SUBLIMAZE ) injection 50 mcg (50 mcg Intravenous Given 08/22/23 1306)  ketorolac  (TORADOL ) 15 MG/ML injection 15 mg (15 mg Intravenous Given 08/22/23 1531)  diphenhydrAMINE  (BENADRYL ) injection 12.5 mg (12.5 mg Intravenous Given 08/22/23 1531)  methocarbamol  (ROBAXIN ) injection 500 mg (500 mg Intravenous Given 08/22/23 1621)  fentaNYL  (SUBLIMAZE ) injection 50 mcg (50 mcg Intravenous Given 08/22/23 1710)     IMPRESSION / MDM / ASSESSMENT AND PLAN / ED COURSE  I reviewed the triage vital signs and the nursing notes.                              Differential diagnosis includes, but is not limited to, fracture, labrum tear, tendon rupture, muscle tear, chronic pain  Patient's presentation is most consistent with acute illness / injury with system symptoms.   Patient states even though her allergies list tramadol  she does take tramadol  but only has take a half of a Benadryl  with it.  States she took 50 mg this morning and has not had any trouble with itching etc.  Would like to have  another 50 herewith 12-1/2 of Benadryl .  Patient did arrive via EMS that she will not be driving  X-ray left hip independently reviewed interpreted by me as being negative for any acute abnormality, chronic changes noted  Patient's pain has not been controlled with tramadol , we did give her Benadryl  with the tramadol , will now try fentanyl  50 mcg IV.  Patient to CT left hip for possible occult fracture  CT of the left hip independently reviewed interpreted by me as being negative for any acute abnormality  Patient did not have pain control fentanyl .  States she does tolerate Toradol  so we will try Toradol  15 mg IV along with 12-1/2 mg of Benadryl  IV as she gets itching with this medication.  On recheck of the patient she has not had any relief this medication, Lidoderm  patch ordered, Robaxin  IV ordered  At this point I feel the patient will just need to be admitted for pain control.  She still been unable to stand on her own.  Will contact hospitalist for admission   Consult hospitalist, she is willing to admit the patient.  Requests that I reach out to orthopedics to see if they have any other suggestions Secure message sent to Dr. Edie as he is on call for [facility] ortho  FINAL CLINICAL IMPRESSION(S) / ED DIAGNOSES   Final diagnoses:  Lumbar radiculitis  Acute hip pain, left     Rx / DC Orders   ED Discharge Orders     None        Note:  This document was prepared using Dragon voice recognition software and may include unintentional dictation errors.    Gasper Devere ORN, PA-C 08/22/23 1730    Arlander Charleston, MD 08/23/23 [phone]

## 2023-08-22 NOTE — Progress Notes (Addendum)
 Pt refused steroid injection due to upcoming hip surgery  Transported to MRI via stretcher

## 2023-08-22 NOTE — ED Notes (Signed)
 Pt called out. Pt stated that she needed to use the BR. This NT asked pt if she was able to stand, pt stated that she could not. Pt stated she wanted a purewick and this NT informed pt that a order had to be put in. The pt requested a bedpan. This NT went and and got a bedpan and placed it under pt. Pt urinated. Bedpan removed.

## 2023-08-22 NOTE — ED Triage Notes (Signed)
 Per EMS, Pt, from home, c/o L hip pain.  Pain score 10/10.  Pt has chronic hip pain and surgery scheduled in September.  Pt reports she was hanging something up and tweaked her hip.  Sts she has been unable to bear weight.

## 2023-08-23 ENCOUNTER — Telehealth: Payer: Self-pay | Admitting: Nurse Practitioner

## 2023-08-23 DIAGNOSIS — M5416 Radiculopathy, lumbar region: Secondary | ICD-10-CM | POA: Diagnosis not present

## 2023-08-23 DIAGNOSIS — M25552 Pain in left hip: Secondary | ICD-10-CM | POA: Diagnosis not present

## 2023-08-23 LAB — BASIC METABOLIC PANEL WITH GFR
Anion gap: 4 — ABNORMAL LOW (ref 5–15)
BUN: 22 mg/dL — ABNORMAL HIGH (ref 6–20)
CO2: 27 mmol/L (ref 22–32)
Calcium: 8.7 mg/dL — ABNORMAL LOW (ref 8.9–10.3)
Chloride: 107 mmol/L (ref 98–111)
Creatinine, Ser: 0.76 mg/dL (ref 0.44–1.00)
GFR, Estimated: >60 mL/min (ref >60)
Glucose, Bld: 89 mg/dL (ref 70–99)
Potassium: 4 mmol/L (ref 3.5–5.1)
Sodium: 138 mmol/L (ref 135–145)

## 2023-08-23 LAB — CBC
HCT: 37.7 % (ref 36.0–46.0)
Hemoglobin: 12.8 g/dL (ref 12.0–15.0)
MCH: 30.5 pg (ref 26.0–34.0)
MCHC: 34 g/dL (ref 30.0–36.0)
MCV: 89.8 fL (ref 80.0–100.0)
Platelets: 315 K/uL (ref 150–400)
RBC: 4.2 MIL/uL (ref 3.87–5.11)
RDW: 13.2 % (ref 11.5–15.5)
WBC: 7.1 K/uL (ref 4.0–10.5)
nRBC: 0 % (ref 0.0–0.2)

## 2023-08-23 LAB — HIV ANTIBODY (ROUTINE TESTING W REFLEX): HIV Screen 4th Generation wRfx: NONREACTIVE

## 2023-08-23 MED ORDER — METHOCARBAMOL 500 MG PO TABS
500.0000 mg | ORAL_TABLET | Freq: Three times a day (TID) | ORAL | Status: DC | PRN
  Administered 2023-08-23: 500 mg via ORAL
  Filled 2023-08-23: qty 1

## 2023-08-23 MED ORDER — METHOCARBAMOL 500 MG PO TABS: 500.0000 mg | ORAL_TABLET | Freq: Three times a day (TID) | ORAL | 0 refills | Status: DC | PRN

## 2023-08-23 MED ORDER — DIPHENHYDRAMINE HCL 25 MG PO CAPS
25.0000 mg | ORAL_CAPSULE | Freq: Four times a day (QID) | ORAL | Status: DC | PRN
  Administered 2023-08-23: 25 mg via ORAL
  Filled 2023-08-23: qty 1

## 2023-08-23 MED ORDER — DIPHENHYDRAMINE HCL 25 MG PO CAPS: 25.0000 mg | ORAL_CAPSULE | Freq: Four times a day (QID) | ORAL | 0 refills | Status: DC | PRN

## 2023-08-23 MED ORDER — HYDROCODONE-ACETAMINOPHEN 5-325 MG PO TABS: 2.0000 | ORAL_TABLET | ORAL | 0 refills | Status: DC | PRN

## 2023-08-23 NOTE — Discharge Summary (Signed)
 Physician Discharge Summary   Patient: Debbie Sosa MRN: [id number] DOB: 1975-06-16  Admit date:     08/22/2023  Discharge date: 08/23/23  Discharge Physician: Amaryllis Dare   PCP: Auston Reyes BIRCH, MD   Recommendations at discharge:  Please obtain CBC and BMP and follow-up  Follow-up with orthopedic surgery Follow-up with primary care provider  Discharge Diagnoses: Principal Problem:   Left hip pain Active Problems:   Lumbar radiculitis   Hospital Course:  Debbie Sosa is a 48 y.o. female with medical history significant of Chronic low back pain bilateral lumbar radiculitis, Left greater trochanteric bursitis, chronic left hip pain and lumbar radiculopathy status post epidural injection on 08/21/2023 pending a left total hip arthroplasty in September 2025 presents emergency department for evaluation of constant, nonradiating left groin pain that started when she reached above her head earlier today to grab an empty bag.  Pain is exacerbated with any movement of the hip, somewhat relieved with lying flat.   On presentation hemodynamically stable, mild leukocytosis at 12, CT of left hip was negative for any acute osseous abnormality. Orthopedic surgery was consulted and patient was admitted for pain control.  MRI of hip and pelvis was ordered.  8/6: Vitals and labs stable, leukocytosis resolved.  MRI with some advancement of her chronic severe arthritis of hip, no significant acute changes.  Orthopedic surgery evaluated her and they were recommending continue outpatient follow-up with her surgeon, they are also going to message him to move up her surgery which is originally planned for September 8.  Patient was given muscle relaxant and pain medication to use as needed.  She will continue the rest of her home medications as she was doing it before and follow-up with her providers for further assistance.   Pain control - [place]  Controlled Substance Reporting System  database was reviewed. and patient was instructed, not to drive, operate heavy machinery, perform activities at heights, swimming or participation in water activities or provide baby-sitting services while on Pain, Sleep and Anxiety Medications; until their outpatient Physician has advised to do so again. Also recommended to not to take more than prescribed Pain, Sleep and Anxiety Medications.  Consultants: Orthopedic surgery Procedures performed: None Disposition: Home Diet recommendation:  Discharge Diet Orders (From admission, onward)     Start     Ordered   08/23/23 0000  Diet - low sodium heart healthy        08/23/23 1538           Regular diet DISCHARGE MEDICATION: Allergies as of 08/23/2023       Reactions   Amoxicillin  Anaphylaxis   Throat swelling   Avocado Anaphylaxis   Cephalexin  Hives, Rash   Fexofenadine Shortness Of Breath   Iodinated Contrast Media Hives   Ketorolac  Hives   Morphine Anaphylaxis   Penicillins Swelling   Throat swelling   Promethazine Hives   Diflucan  [fluconazole ] Rash   Apple    Cetirizine Other (See Comments)   Skin eruptures   Diclofenac  Sodium Hives   Doxycycline Other (See Comments)   Latex    With condoms   Other Hives   Pain medicine that starts with an L   Tramadol  Hives   Hepatitis B Virus Vaccines Rash   Misc. Sulfonamide Containing Compounds Rash   Morphine And Codeine Rash   Naproxen Rash   Oxycodone -acetaminophen  Itching   Pyridium [phenazopyridine Hcl] Rash   Sulfa Antibiotics Rash   Sulfasalazine Rash   Vioxx [rofecoxib] Rash  Medication List     STOP taking these medications    traZODone 50 MG tablet Commonly known as: DESYREL       TAKE these medications    albuterol  108 (90 Base) MCG/ACT inhaler Commonly known as: VENTOLIN  HFA Inhale 1-2 puffs INH Q4-6hr prn for chest tightness, cough, wheezing, SOB/DOE.   cyanocobalamin  1000 MCG/ML injection Commonly known as: VITAMIN B12 Inject 1,000 mcg  into the muscle once a week.   diphenhydrAMINE  25 mg capsule Commonly known as: BENADRYL  Take 1 capsule (25 mg total) by mouth every 6 (six) hours as needed for itching or allergies.   EPINEPHrine  0.3 mg/0.3 mL Soaj injection Commonly known as: EpiPen  2-Pak Inject 0.3 mLs (0.3 mg total) into the muscle as needed for anaphylaxis.   esomeprazole  40 MG capsule Commonly known as: NEXIUM  Take 1 capsule (40 mg total) by mouth daily at 12 noon.   famotidine  20 MG tablet Commonly known as: PEPCID  Take 1 tablet (20 mg total) by mouth 2 (two) times daily.   HYDROcodone -acetaminophen  5-325 MG tablet Commonly known as: NORCO/VICODIN Take 2 tablets by mouth every 4 (four) hours as needed for moderate pain (pain score 4-6). What changed:  how much to take reasons to take this   levocetirizine 5 MG tablet Commonly known as: XYZAL Take 5 mg by mouth every morning.   levonorgestrel  20 MCG/DAY Iud Commonly known as: MIRENA  by Intrauterine route.   methocarbamol  500 MG tablet Commonly known as: ROBAXIN  Take 1 tablet (500 mg total) by mouth every 8 (eight) hours as needed for muscle spasms.   montelukast  10 MG tablet Commonly known as: SINGULAIR    predniSONE  10 MG tablet Commonly known as: DELTASONE  SMARTSIG:- Tablet(s) By Mouth -   pregabalin 25 MG capsule Commonly known as: LYRICA Take 1 capsule p.o. nightly for 4-5 nights, then increase to 1 capsule p.o. twice daily for 4 to 5 days, then increase to 1 capsule p.o. 3 times daily   Vitamin D  (Ergocalciferol ) 1.25 MG (50000 UNIT) Caps capsule Commonly known as: DRISDOL  Take 50,000 Units by mouth once a week. Mondays        Follow-up Information     Auston Reyes BIRCH, MD. Call .   Specialty: Internal Medicine Why: As needed Contact information: [address] [facility] [place] [address] [phone]         Lorelle Hussar, MD. Schedule an appointment as soon as possible for a visit.   Specialty:  Orthopedic Surgery Contact information: [address] [place] [address] [phone]                Discharge Exam: [name]   08/22/23 1004  Weight: 90.7 kg   General. Well-developed lady, in no acute distress. Pulmonary.  Lungs clear bilaterally, normal respiratory effort. CV.  Regular rate and rhythm, no JVD, rub or murmur. Abdomen.  Soft, nontender, nondistended, BS positive. CNS.  Alert and oriented .  No focal neurologic deficit. Extremities.  No edema, no cyanosis, pulses intact and symmetrical. Psychiatry.  Judgment and insight appears normal.   Condition at discharge: stable  The results of significant diagnostics from this hospitalization (including imaging, microbiology, ancillary and laboratory) are listed below for reference.   Imaging Studies: MR PELVIS WO CONTRAST Result Date: 08/23/2023 CLINICAL DATA:  Acute on chronic left hip pain. EXAM: MRI PELVIS WITHOUT CONTRAST TECHNIQUE: Multiplanar multisequence MR imaging of the pelvis was performed. No intravenous contrast was administered. COMPARISON:  CT of the left hip dated  08/22/2023. MRI of the left hip dated 11/19/2022. FINDINGS: Left hip: Severe superior joint space narrowing of the left hip with near full-thickness chondral loss of the central weight-bearing portion resulting in bone-on-bone contact with regions of femoral head subchondral flattening and depression and pronounced T2/STIR hyperintense marrow edema of the left femoral head and neck. Mild marrow edema at the superolateral acetabulum. There is a small left hip joint effusion. 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 T2 hyperintense signal at the base of the anterior superior labrum is concerning for a tear. Bones: The remainder of the  visualized bones demonstrate normal marrow signal intensity. Right hip demonstrates similar mild joint space narrowing with no joint effusion. Sacroiliac joints are anatomically aligned with mild degenerative arthropathy. Pubic symphysis is unremarkable. Soft tissue and Muscle: There is no obvious soft tissue swelling. Muscles demonstrate normal morphology and signal intensity.Hamstring tendon origins are intact.Partial-thickness undersurface tear of the left gluteus minimus cuff insertion is again noted with mild tendinosis. Mild tendinosis of the right gluteus medius cuff insertion with low-grade partial-thickness undersurface tear.Visualized intrapelvic contents are grossly unremarkable IMPRESSION: 1. Severe superior joint space narrowing of the left hip with near full-thickness chondral loss of the central weight-bearing portion resulting in bone-on-bone contact with regions of femoral head subchondral flattening and depression and marrow edema of the left femoral head and neck. Mild marrow edema at the superolateral acetabulum. There is a small left hip joint effusion. 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 2. Tear of the left anterosuperior labrum. 3. Mild similar joint space narrowing of the right hip without joint effusion. 4. Redemonstrated partial-thickness undersurface tear of the left gluteus minimus cuff insertion with mild tendinosis. 5. Mild tendinosis of the right gluteus medius cuff insertion with low-grade partial-thickness undersurface tear. Electronically Signed   By: Harrietta Sherry M.D.   On: 08/23/2023 09:46   MR HIP LEFT WO CONTRAST Result Date: 08/23/2023 CLINICAL DATA:  Acute on chronic left hip pain. EXAM: MRI PELVIS WITHOUT CONTRAST TECHNIQUE: Multiplanar multisequence MR imaging of the  pelvis was performed. No intravenous contrast was administered. COMPARISON:  CT of the left hip dated 08/22/2023. MRI of the left hip dated 11/19/2022. FINDINGS: Left hip: Severe superior joint space narrowing of the left hip with near full-thickness chondral loss of the central weight-bearing portion resulting in bone-on-bone contact with regions of femoral head subchondral flattening and depression and pronounced T2/STIR hyperintense marrow edema of the left femoral head and neck. Mild marrow edema at the superolateral acetabulum. There is a small left hip joint effusion. 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 T2 hyperintense signal at the base of the anterior superior labrum is concerning for a tear. Bones: The remainder of the visualized bones demonstrate normal marrow signal intensity. Right hip demonstrates similar mild joint space narrowing with no joint effusion. Sacroiliac joints are anatomically aligned with mild degenerative arthropathy. Pubic symphysis is unremarkable. Soft tissue and Muscle: There is no obvious soft tissue swelling. Muscles demonstrate normal morphology and signal intensity.Hamstring tendon  origins are intact.Partial-thickness undersurface tear of the left gluteus minimus cuff insertion is again noted with mild tendinosis. Mild tendinosis of the right gluteus medius cuff insertion with low-grade partial-thickness undersurface tear.Visualized intrapelvic contents are grossly unremarkable IMPRESSION: 1. Severe superior joint space narrowing of the left hip with near full-thickness chondral loss of the central weight-bearing portion resulting in bone-on-bone contact with regions of femoral head subchondral flattening and depression and marrow edema of the left femoral head and neck. Mild  marrow edema at the superolateral acetabulum. There is a small left hip joint effusion. 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 2. Tear of the left anterosuperior labrum. 3. Mild similar joint space narrowing of the right hip without joint effusion. 4. Redemonstrated partial-thickness undersurface tear of the left gluteus minimus cuff insertion with mild tendinosis. 5. Mild tendinosis of the right gluteus medius cuff insertion with low-grade partial-thickness undersurface tear. Electronically Signed   By: Harrietta Sherry M.D.   On: 08/23/2023 09:46   CT Hip Left Wo Contrast Result Date: 08/22/2023 CLINICAL DATA:  Acute on chronic left hip pain. EXAM: CT OF THE LEFT HIP WITHOUT CONTRAST TECHNIQUE: Multidetector CT imaging of the left hip was performed according to the standard protocol. Multiplanar CT image reconstructions were also generated. RADIATION DOSE REDUCTION: This exam was performed according to the departmental dose-optimization program which includes automated exposure control, adjustment of the mA and/or kV according to patient size and/or use of iterative reconstruction technique. COMPARISON:  Left hip radiographs dated 08/22/2023. MRI of the left hip dated 11/19/2022. FINDINGS: Bones/Joint/Cartilage No acute fracture or dislocation. The left femoral head is seated within the acetabulum. Severe superior joint space narrowing of the left hip with associated acetabular and humeral head osteophytosis, and subchondral sclerosis. Left greater trochanteric enthesopathy. Visualized left sacroiliac joint and pubic symphysis are anatomically aligned. No sizable joint effusion. Ligaments Ligaments are suboptimally evaluated by CT. Muscles and Tendons Muscles are normal. No muscle atrophy. No intramuscular fluid  collection or hematoma. Soft tissue No fluid collection or hematoma. No soft tissue mass. Partially visualized IUD in place. IMPRESSION: 1. No acute osseous abnormality. 2. Severe superior joint space narrowing of the left hip with osteophytosis and subchondral sclerosis. Electronically Signed   By: Harrietta Sherry M.D.   On: 08/22/2023 14:40   DG Hip Unilat W or Wo Pelvis 2-3 Views Left Result Date: 08/22/2023 EXAM: 2 or 3 VIEW(S) XRAY OF THE LEFT HIP 08/22/2023 11:25:00 AM COMPARISON: 12/16/2019, number 2 [phone] CLINICAL HISTORY: Pain unable to bear weight. Patient complains of left hip pain; unable to bear weight. FINDINGS: BONES AND JOINTS: Worsening joint space loss of the left hip with near bone-on-bone articulation. Mild joint space loss of the right hip. Degenerative disc disease of the visualized lumbar spine. SOFT TISSUES: Intruterine device overlying the midline pelvis. Multiple pelvic phleboliths. IMPRESSION: 1. Progressive joint space loss of the left hip with near bone-on-bone articulation, consistent with worsening osteoarthritis. Electronically signed by: Rogelia Myers MD 08/22/2023 12:34 PM EDT RP Workstation: [id number]   MR LUMBAR SPINE WO CONTRAST Result Date: 08/10/2023 CLINICAL DATA:  Low back pain with left-sided radiculopathy EXAM: MRI LUMBAR SPINE WITHOUT CONTRAST TECHNIQUE: Multiplanar, multisequence MR imaging of the lumbar spine was performed. No intravenous contrast was administered. COMPARISON:  02/25/2021 FINDINGS: Segmentation:  Standard. Alignment:  Physiologic. Vertebrae:  No fracture, evidence  of discitis, or bone lesion. Conus medullaris and cauda equina: Conus extends to the T12-L1 level. Conus and cauda equina appear normal. Paraspinal and other soft tissues: Retroverted uterus. No acute findings. Disc levels: T12-L1 through L2-L3: No significant disc protrusion, foraminal stenosis, or canal stenosis. L3-L4: Annular disc bulge with left foraminal protrusion resulting in  moderate left and mild right foraminal stenosis without canal stenosis. No significant interval progression. L4-L5: Annular disc bulge and bilateral facet hypertrophy. No significant foraminal or canal stenosis. L5-S1: No disc bulge. Moderate right worse than left facet arthropathy. No foraminal or canal stenosis. IMPRESSION: 1. Lumbar spondylosis most pronounced at L3-L4 where there is moderate left and mild right foraminal stenosis. No significant interval progression. 2. No canal stenosis at any level. Electronically Signed   By: Mabel Converse D.O.   On: 08/10/2023 15:10    Microbiology: Results for orders placed or performed during the hospital encounter of 05/14/23  Group A Strep by PCR ([facility] Only)     Status: None   Collection Time: 05/15/23 12:34 AM   Specimen: Throat; Sterile Swab  Result Value Ref Range Status   Group A Strep by PCR NOT DETECTED NOT DETECTED Final    Comment: Performed at [facility], [address] [place], [place] [address]    Labs: CBC: Recent Labs  Lab 08/22/23 1641 08/23/23 0521  WBC 12.0* 7.1  NEUTROABS 9.3*  --   HGB 13.0 12.8  HCT 39.4 37.7  MCV 91.8 89.8  PLT 311 315   Basic Metabolic Panel: Recent Labs  Lab 08/22/23 1641 08/23/23 0521  NA 138 138  K 3.5 4.0  CL 107 107  CO2 20* 27  GLUCOSE 128* 89  BUN 12 22*  CREATININE 0.90 0.76  CALCIUM 8.8* 8.7*   Liver Function Tests: No results for input(s): AST, ALT, ALKPHOS, BILITOT, PROT, ALBUMIN in the last 168 hours. CBG: No results for input(s): GLUCAP in the last 168 hours.  Discharge time spent: greater than 30 minutes.  This record has been created using Conservation officer, historic buildings. Errors have been sought and corrected,but may not always be located. Such creation errors do not reflect on the standard of care.   Signed: Amaryllis Dare, MD [facility] 08/23/2023

## 2023-08-23 NOTE — Telephone Encounter (Signed)
 Called patient to notify of cancelled appointment due to hospitalization. Not able to reach patient. Voicemail full. Appointments cancelled per chat.

## 2023-08-23 NOTE — Consult Note (Signed)
 ORTHOPAEDIC CONSULTATION  REQUESTING PHYSICIAN: Caleen Qualia, MD  Chief Complaint:   Left hip/groin pain.  History of Present Illness: Debbie Sosa is a 48 y.o. female with multiple medical problems including obesity, sleep apnea, gastroesophageal reflux disease, chronic venous insufficiency/lymphedema, and asthma who has been followed by Dr. 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  Apparently, the patient is scheduled for a left total hip arthroplasty in September but she presented to the emergency room yesterday complaining of worsening pain in her left hip and groin region.  Plain radiographs, CT scan, and finally an MRI scan of the pelvis and left hip were obtained.  None of the studies show any acute fractures or other processes which might be contributing to her symptoms, other than the aggressive degenerative changes of her left hip.  The patient was admitted for pain control, and as the covering provider for our group, I was asked to formally consult on the patient.  Past Medical History:  Diagnosis Date   Anemia    Antiphospholipid antibody positive    Asthma    Chronic venous insufficiency    COVID-19 2020   DDD (degenerative disc disease), lumbar    GERD (gastroesophageal reflux disease)    Headache    h/o migraines   History of hiatal hernia    History of kidney stones    Iron  deficiency    recieves iron  infusions every 3 months at the cancer center   Lymphedema    bil legs-sees [facility]   Nausea and vomiting 10/14/2015   Obesity    Pneumonia    Pulmonary embolism (HCC)    after 2nd c-section   Sleep apnea    had gastric bypass and does not need to use cpap since surgery   Vitamin B12 deficiency    Past Surgical History:  Procedure Laterality Date   BREAST BIOPSY Left 2006   benign/clip   BREAST BIOPSY Right 2015   benign/clip   CESAREAN SECTION     CESAREAN SECTION      CHOLECYSTECTOMY     COLONOSCOPY     COLONOSCOPY WITH PROPOFOL  N/A 10/31/2018   Procedure: COLONOSCOPY WITH PROPOFOL ;  Surgeon: Therisa Bi, MD;  Location: [facility] ENDOSCOPY;  Service: Gastroenterology;  Laterality: N/A;   ESOPHAGOGASTRODUODENOSCOPY (EGD) WITH PROPOFOL  N/A 10/04/2016   Procedure: ESOPHAGOGASTRODUODENOSCOPY (EGD) WITH PROPOFOL ;  Surgeon: Therisa Bi, MD;  Location: [facility] ENDOSCOPY;  Service: Gastroenterology;  Laterality: N/A;   ESOPHAGOGASTRODUODENOSCOPY (EGD) WITH PROPOFOL  N/A 04/14/2017   Procedure: ESOPHAGOGASTRODUODENOSCOPY (EGD) WITH PROPOFOL ;  Surgeon: Therisa Bi, MD;  Location: [facility] ENDOSCOPY;  Service: Gastroenterology;  Laterality: N/A;   ESOPHAGOGASTRODUODENOSCOPY (EGD) WITH PROPOFOL  N/A 10/31/2018   Procedure: ESOPHAGOGASTRODUODENOSCOPY (EGD) WITH PROPOFOL ;  Surgeon: Therisa Bi, MD;  Location: [facility] ENDOSCOPY;  Service: Gastroenterology;  Laterality: N/A;   ESOPHAGOGASTRODUODENOSCOPY (EGD) WITH PROPOFOL  N/A 06/28/2019   Procedure: ESOPHAGOGASTRODUODENOSCOPY (EGD) WITH PROPOFOL ;  Surgeon: Therisa Bi, MD;  Location: [facility] ENDOSCOPY;  Service: Gastroenterology;  Laterality: N/A;   ESOPHAGOGASTRODUODENOSCOPY (EGD) WITH PROPOFOL  N/A 02/10/2021   Procedure: ESOPHAGOGASTRODUODENOSCOPY (EGD) WITH PROPOFOL ;  Surgeon: Therisa Bi, MD;  Location: [facility] ENDOSCOPY;  Service: Gastroenterology;  Laterality: N/A;   GASTRIC BYPASS     01/2015   HYSTEROSCOPY WITH D & C N/A 09/17/2021   Procedure: DILATATION AND CURETTAGE /HYSTEROSCOPY;  Surgeon: Leonce Garnette BIRCH, MD;  Location: [facility] ORS;  Service: Gynecology;  Laterality: N/A;   INTRAUTERINE DEVICE (IUD) INSERTION N/A 09/17/2021   Procedure: INTRAUTERINE DEVICE (IUD) INSERTION - MIRENA ;  Surgeon: Leonce Garnette  D, MD;  Location: [facility] ORS;  Service: Gynecology;  Laterality: N/A;   IUD REMOVAL N/A 09/17/2021   Procedure: INTRAUTERINE DEVICE (IUD) REMOVAL;  Surgeon: Leonce Garnette BIRCH, MD;  Location: [facility] ORS;  Service: Gynecology;  Laterality:  N/A;   KNEE SURGERY Right    NASAL SINUS SURGERY     PLANTAR FASCIA SURGERY     x 3   REDUCTION MAMMAPLASTY Bilateral 2005   Social History   Socioeconomic History   Marital status: Single    Spouse name: Not on file   Number of children: Not on file   Years of education: Not on file   Highest education level: Not on file  Occupational History   Not on file  Tobacco Use   Smoking status: Never   Smokeless tobacco: Never  Vaping Use   Vaping status: Never Used  Substance and Sexual Activity   Alcohol use: No   Drug use: No   Sexual activity: Not on file  Other Topics Concern   Not on file  Social History Narrative   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    Social Drivers of Health   Financial Resource Strain: Medium Risk (06/01/2023)   Received from [facility]   Overall Financial Resource Strain (CARDIA)    Difficulty of Paying Living Expenses: Somewhat hard  Food Insecurity: No Food Insecurity (08/22/2023)   Hunger Vital Sign    Worried About Running Out of Food in the Last Year: Never true    Ran Out of Food in the Last Year: Never true  Recent Concern: Food Insecurity - Food Insecurity Present (06/01/2023)   Received from [facility]   Hunger Vital Sign    Within the past 12 months, you worried that your food would run out before you got the money to buy more.: Sometimes true    Within the past 12 months, the food you bought just didn't last and you didn't have money to get more.: Never true  Transportation Needs: No Transportation Needs (08/22/2023)   PRAPARE - Administrator, Civil Service (Medical): No    Lack of Transportation (Non-Medical): No  Physical Activity: Not on file  Stress: Not on file  Social Connections: Socially Isolated (08/22/2023)   Social Connection and Isolation Panel    Frequency of Communication with Friends and  Family: More than three times a week    Frequency of Social Gatherings with Friends and Family: More than three times a week    Attends Religious Services: Never    Database administrator or Organizations: No    Attends Engineer, structural: Never    Marital Status: Never married   Family History  Problem Relation Age of Onset   Breast cancer Mother 4   Breast cancer Cousin 30   Breast cancer Cousin 52   Asthma Father    Asthma Sister    Asthma Child    Allergies  Allergen Reactions   Amoxicillin  Anaphylaxis    Throat swelling   Avocado Anaphylaxis   Cephalexin  Hives and Rash   Fexofenadine Shortness Of Breath   Iodinated Contrast Media Hives   Ketorolac  Hives   Morphine Anaphylaxis   Penicillins Swelling    Throat swelling    Promethazine Hives   Diflucan  [Fluconazole ] Rash   Apple    Cetirizine Other (See Comments)    Skin eruptures   Diclofenac   Sodium Hives   Doxycycline Other (See Comments)   Latex     With condoms   Other Hives    Pain medicine that starts with an L   Tramadol  Hives   Hepatitis B Virus Vaccines Rash   Misc. Sulfonamide Containing Compounds Rash   Morphine And Codeine Rash   Naproxen Rash   Oxycodone -Acetaminophen  Itching   Pyridium [Phenazopyridine Hcl] Rash   Sulfa Antibiotics Rash   Sulfasalazine Rash   Vioxx [Rofecoxib] Rash   Prior to Admission medications   Medication Sig Start Date End Date Taking? Authorizing Provider  albuterol  (VENTOLIN  HFA) 108 (90 Base) MCG/ACT inhaler Inhale 1-2 puffs INH Q4-6hr prn for chest tightness, cough, wheezing, SOB/DOE. 01/10/19  Yes Christopher Savannah, PA-C  cyanocobalamin  (,VITAMIN B-12,) 1000 MCG/ML injection Inject 1,000 mcg into the muscle once a week. 01/13/20  Yes [provider]  EPINEPHrine  (EPIPEN  2-PAK) 0.3 mg/0.3 mL IJ SOAJ injection Inject 0.3 mLs (0.3 mg total) into the muscle as needed for anaphylaxis. 04/16/19  Yes Meade Verdon RAMAN, MD  famotidine  (PEPCID ) 20 MG tablet Take 1  tablet (20 mg total) by mouth 2 (two) times daily. 07/18/22 08/22/23 Yes [place], PA-C  HYDROcodone -acetaminophen  (NORCO/VICODIN) 5-325 MG tablet Take 1 tablet by mouth every 4 (four) hours as needed. 07/08/23 07/07/24 Yes Dougherty, Lauren A, PA-C  levocetirizine (XYZAL) 5 MG tablet Take 5 mg by mouth every morning.   Yes [provider]  levonorgestrel  (MIRENA ) 20 MCG/DAY IUD by Intrauterine route.   Yes [provider]  montelukast  (SINGULAIR ) 10 MG tablet    Yes [provider]  predniSONE  (DELTASONE ) 10 MG tablet SMARTSIG:- Tablet(s) By Mouth - 05/29/23  Yes [provider]  pregabalin (LYRICA) 25 MG capsule Take 1 capsule p.o. nightly for 4-5 nights, then increase to 1 capsule p.o. twice daily for 4 to 5 days, then increase to 1 capsule p.o. 3 times daily 08/04/23  Yes [provider]  Vitamin D , Ergocalciferol , (DRISDOL ) 1.25 MG (50000 UT) CAPS capsule Take 50,000 Units by mouth once a week. Mondays 11/28/18  Yes [provider]  esomeprazole  (NEXIUM ) 40 MG capsule Take 1 capsule (40 mg total) by mouth daily at 12 noon. 07/18/22 07/13/23  [place], PA-C  traZODone (DESYREL) 50 MG tablet Take by mouth. Patient not taking: Reported on 08/22/2023 02/24/23 02/24/24  [provider]  pantoprazole  (PROTONIX ) 40 MG tablet Take 1 tablet (40 mg total) by mouth daily. 04/16/19 12/16/19  Meade Verdon RAMAN, MD   MR PELVIS WO CONTRAST Result Date: 08/23/2023 CLINICAL DATA:  Acute on chronic left hip pain. EXAM: MRI PELVIS WITHOUT CONTRAST TECHNIQUE: Multiplanar multisequence MR imaging of the pelvis was performed. No intravenous contrast was administered. COMPARISON:  CT of the left hip dated 08/22/2023. MRI of the left hip dated 11/19/2022. FINDINGS: Left hip: Severe superior joint space narrowing of the left hip with near full-thickness chondral loss of the central weight-bearing portion resulting in bone-on-bone contact with regions of femoral head  subchondral flattening and depression and pronounced T2/STIR hyperintense marrow edema of the left femoral head and neck. Mild marrow edema at the superolateral acetabulum. There is a small left hip joint effusion. 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 T2 hyperintense signal at the base of the anterior superior labrum is concerning for a tear. Bones: The remainder of  the visualized bones demonstrate normal marrow signal intensity. Right hip demonstrates similar mild joint space narrowing with no joint effusion. Sacroiliac joints are anatomically aligned with mild degenerative arthropathy. Pubic symphysis is unremarkable. Soft tissue and Muscle: There is no obvious soft tissue swelling. Muscles demonstrate normal morphology and signal intensity.Hamstring tendon origins are intact.Partial-thickness undersurface tear of the left gluteus minimus cuff insertion is again noted with mild tendinosis. Mild tendinosis of the right gluteus medius cuff insertion with low-grade partial-thickness undersurface tear.Visualized intrapelvic contents are grossly unremarkable IMPRESSION: 1. Severe superior joint space narrowing of the left hip with near full-thickness chondral loss of the central weight-bearing portion resulting in bone-on-bone contact with regions of femoral head subchondral flattening and depression and marrow edema of the left femoral head and neck. Mild marrow edema at the superolateral acetabulum. There is a small left hip joint effusion. 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 2. Tear of the left anterosuperior  labrum. 3. Mild similar joint space narrowing of the right hip without joint effusion. 4. Redemonstrated partial-thickness undersurface tear of the left gluteus minimus cuff insertion with mild tendinosis. 5. Mild tendinosis of the right gluteus medius cuff insertion with low-grade partial-thickness undersurface tear. Electronically Signed   By: Harrietta Sherry M.D.   On: 08/23/2023 09:46   MR HIP LEFT WO CONTRAST Result Date: 08/23/2023 CLINICAL DATA:  Acute on chronic left hip pain. EXAM: MRI PELVIS WITHOUT CONTRAST TECHNIQUE: Multiplanar multisequence MR imaging of the pelvis was performed. No intravenous contrast was administered. COMPARISON:  CT of the left hip dated 08/22/2023. MRI of the left hip dated 11/19/2022. FINDINGS: Left hip: Severe superior joint space narrowing of the left hip with near full-thickness chondral loss of the central weight-bearing portion resulting in bone-on-bone contact with regions of femoral head subchondral flattening and depression and pronounced T2/STIR hyperintense marrow edema of the left femoral head and neck. Mild marrow edema at the superolateral acetabulum. There is a small left hip joint effusion. 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 T2 hyperintense signal at the base of the anterior superior labrum is concerning for a tear. Bones: The remainder of the visualized bones demonstrate normal marrow signal intensity. Right hip demonstrates similar mild joint space narrowing with no joint effusion. Sacroiliac joints are anatomically aligned with mild degenerative arthropathy. Pubic symphysis is unremarkable. Soft tissue and Muscle: There is no obvious soft tissue swelling. Muscles demonstrate normal morphology and signal intensity.Hamstring tendon origins are  intact.Partial-thickness undersurface tear of the left gluteus minimus cuff insertion is again noted with mild tendinosis. Mild tendinosis of the right gluteus medius cuff insertion with low-grade partial-thickness undersurface tear.Visualized intrapelvic contents are grossly unremarkable IMPRESSION: 1. Severe superior joint space narrowing of the left hip with near full-thickness chondral loss of the central weight-bearing portion resulting in bone-on-bone contact with regions of femoral head subchondral flattening and depression and marrow edema of the left femoral head and neck. Mild marrow edema at the superolateral acetabulum. There is a small left hip joint effusion. These findings are new compared to the prior MRI dated 11/19/2022 and concerning for rapidly progressive osteoarthritis of the left hip with subchondral insufficiency fracture of the superior weight-bearing portion  of the left femoral head. 2. Tear of the left anterosuperior labrum. 3. Mild similar joint space narrowing of the right hip without joint effusion. 4. Redemonstrated partial-thickness undersurface tear of the left gluteus minimus cuff insertion with mild tendinosis. 5. Mild tendinosis of the right gluteus medius cuff insertion with low-grade partial-thickness undersurface tear. Electronically Signed   By: Harrietta Sherry M.D.   On: 08/23/2023 09:46   CT Hip Left Wo Contrast Result Date: 08/22/2023 CLINICAL DATA:  Acute on chronic left hip pain. EXAM: CT OF THE LEFT HIP WITHOUT CONTRAST TECHNIQUE: Multidetector CT imaging of the left hip was performed according to the standard protocol. Multiplanar CT image reconstructions were also generated. RADIATION DOSE REDUCTION: This exam was performed according to the departmental dose-optimization program which includes automated exposure control, adjustment of the mA and/or kV according to patient size and/or use of iterative reconstruction technique. COMPARISON:  Left hip radiographs dated  08/22/2023. MRI of the left hip dated 11/19/2022. FINDINGS: Bones/Joint/Cartilage No acute fracture or dislocation. The left femoral head is seated within the acetabulum. Severe superior joint space narrowing of the left hip with associated acetabular and humeral head osteophytosis, and subchondral sclerosis. Left greater trochanteric enthesopathy. Visualized left sacroiliac joint and pubic symphysis are anatomically aligned. No sizable joint effusion. Ligaments Ligaments are suboptimally evaluated by CT. Muscles and Tendons Muscles are normal. No muscle atrophy. No intramuscular fluid collection or hematoma. Soft tissue No fluid collection or hematoma. No soft tissue mass. Partially visualized IUD in place. IMPRESSION: 1. No acute osseous abnormality. 2. Severe superior joint space narrowing of the left hip with osteophytosis and subchondral sclerosis. Electronically Signed   By: Harrietta Sherry M.D.   On: 08/22/2023 14:40   DG Hip Unilat W or Wo Pelvis 2-3 Views Left Result Date: 08/22/2023 EXAM: 2 or 3 VIEW(S) XRAY OF THE LEFT HIP 08/22/2023 11:25:00 AM COMPARISON: 12/16/2019, number [phone] CLINICAL HISTORY: Pain unable to bear weight. Patient complains of left hip pain; unable to bear weight. FINDINGS: BONES AND JOINTS: Worsening joint space loss of the left hip with near bone-on-bone articulation. Mild joint space loss of the right hip. Degenerative disc disease of the visualized lumbar spine. SOFT TISSUES: Intruterine device overlying the midline pelvis. Multiple pelvic phleboliths. IMPRESSION: 1. Progressive joint space loss of the left hip with near bone-on-bone articulation, consistent with worsening osteoarthritis. Electronically signed by: Rogelia Myers MD 08/22/2023 12:34 PM EDT RP Workstation: [id number]    Positive ROS: All other systems have been reviewed and were otherwise negative with the exception of those mentioned in the HPI and as above.  Physical Exam: General:  Alert, no acute  distress Psychiatric:  Patient is competent for consent with normal mood and affect   Cardiovascular:  No pedal edema Respiratory:  No wheezing, non-labored breathing GI:  Abdomen is soft and non-tender Skin:  No lesions in the area of chief complaint Neurologic:  Sensation intact distally Lymphatic:  No axillary or cervical lymphadenopathy  Orthopedic Exam:  Orthopedic examination is limited to the left hip and lower extremity.  The left lower extremity is symmetrically aligned to the right.  Skin inspection around the left hip is unremarkable.  No swelling, erythema, ecchymosis, abrasions, or other skin abnormalities are identified.  She has mild tenderness to palpation over the lateral aspect of the hip.  She has reproduction of her groin pain with attempted active hip flexion, as well as with logrolling the leg.  She is neurovascularly intact to the left lower extremity and  foot.  X-rays:  Plain radiographs, CT scan, and an MRI scan of the pelvis and left hip are available for review and have been reviewed by myself.  The findings are as described above for each of the 3 sets of studies.  Assessment: Severe progressively worsening degenerative joint disease, left hip.  Plan: The treatment options been discussed with the patient.  Although she is reassured by the fact that it does not appear to be any fractures or other more acute bony processes, she is frustrated by her worsening symptoms and functional limitations.  I have explained to her that my partner, Dr. Lorelle is the only total hip surgeon in our group who does anterior approach total hip replacements which, given the patient's age and activity level, would be the best approach for the patient.  Therefore, the patient is encouraged to follow-up with Dr. Lorelle as scheduled.  I will reach out to Dr. Lorelle and let him know Ms. Shoaf symptoms have worsened in to see if he can move her surgical date up.  Meanwhile, the patient may be  mobilized with physical therapy, weightbearing as tolerated on the left leg, but using a walker for balance and support.  She may receive medicine for pain as deemed appropriate from a medical standpoint.  Thank you for asking me to participate in the care of this most pleasant yet unfortunate woman.  I will sign off at this time.  Please have her follow-up with Dr. Anthony in 2-3 weeks.    DOROTHA Reyes Maltos, MD  Beeper #:  [phone]  08/23/2023 1:21 PM

## 2023-08-23 NOTE — Plan of Care (Signed)

## 2023-08-23 NOTE — Evaluation (Signed)
 Physical Therapy Evaluation Patient Details Name: Debbie Sosa MRN: [id number] DOB: February 07, 1975 Today's Date: 08/23/2023  History of Present Illness  48 y.o. female with medical history significant of Chronic low back pain bilateral lumbar radiculitis, Left greater trochanteric bursitis, chronic left hip pain and lumbar radiculopathy status post epidural injection on 08/21/2023 pending a left total hip arthroplasty in September 2025  Clinical Impression  Pt pleasant and eager to work with PT despite c/o significant L hip with with any movement, especially in WBing.  She did utilize walker to assist with WBing/weight distribution but was able to maintain consistent walker momentum with hallway ambulation once she warmed up and had some cuing.  Pt with no LOBs or overt safety issues, negotiate up/down steps w/ step-to strategy.  Pt will benefit from continued PT to address functional limitations.       If plan is discharge home, recommend the following: Assist for transportation;Help with stairs or ramp for entrance   Can travel by private vehicle        Equipment Recommendations None recommended by PT  Recommendations for Other Services       Functional Status Assessment Patient has had a recent decline in their functional status and demonstrates the ability to make significant improvements in function in a reasonable and predictable amount of time.     Precautions / Restrictions Precautions Precautions: Fall Restrictions Weight Bearing Restrictions Per Provider Order: No      Mobility  Bed Mobility Overal bed mobility: Modified Independent             General bed mobility comments: able to get to sitting EOB w/o assist    Transfers Overall transfer level: Independent Equipment used: Rolling walker (2 wheels)               General transfer comment: Pt was not overly reliant on walker for transition to standing but helped with unweighting as R LE WBing increased     Ambulation/Gait Ambulation/Gait assistance: Supervision Gait Distance (Feet): 100 Feet Assistive device: Rolling walker (2 wheels)         General Gait Details: 12ft, stairs, 50 ft.  Pt needed walker for WBing assist with limited tolerance for prolonged standing.  She had no LOBs or safety issues, vitals stable just expected pain limitations with mild Trendelenberg/limp on  Stairs Stairs: Yes Stairs assistance: Supervision Stair Management: Two rails, Step to pattern Number of Stairs: 4 General stair comments: instructed on and performed appropriate stair negotiation technique w/ b/l rails  Wheelchair Mobility     Tilt Bed    Modified Rankin (Stroke Patients Only)       Balance Overall balance assessment: Modified Independent                                           Pertinent Vitals/Pain Pain Assessment Pain Assessment: 0-10 Pain Score: 5  Pain Location: R groin and posterior hip    Home Living Family/patient expects to be discharged to:: Private residence Living Arrangements: Children Available Help at Discharge: Available PRN/intermittently Type of Home: House Home Access: Stairs to enter Entrance Stairs-Rails: Can reach both Entrance Stairs-Number of Steps: 4   Home Layout: Able to live on main level with bedroom/bathroom Home Equipment: Cane - single point;Rolling Walker (2 wheels)      Prior Function Prior Level of Function : Working/employed  Mobility Comments: has been limping more and more but independent and able to do all she needs ADLs Comments: independent, 2nd grade teacher     Extremity/Trunk Assessment   Upper Extremity Assessment Upper Extremity Assessment: Overall WFL for tasks assessed    Lower Extremity Assessment Lower Extremity Assessment: Overall WFL for tasks assessed (expected pain with R hip extension)       Communication   Communication Communication: No apparent difficulties     Cognition Arousal: Alert Behavior During Therapy: WFL for tasks assessed/performed   PT - Cognitive impairments: No apparent impairments                         Following commands: Intact       Cueing Cueing Techniques: Verbal cues     General Comments General comments (skin integrity, edema, etc.): apart from ongoing pain pt was able to move well and safely    Exercises     Assessment/Plan    PT Assessment Patient needs continued PT services  PT Problem List Decreased strength;Decreased range of motion;Decreased activity tolerance;Decreased balance;Decreased mobility;Pain;Decreased safety awareness;Decreased knowledge of use of DME       PT Treatment Interventions Gait training;Stair training;Functional mobility training;Therapeutic activities;Therapeutic exercise;DME instruction;Balance training;Neuromuscular re-education;Patient/family education    PT Goals (Current goals can be found in the Care Plan section)  Acute Rehab PT Goals Patient Stated Goal: go home today PT Goal Formulation: With patient Time For Goal Achievement: 09/05/23 Potential to Achieve Goals: Good    Frequency Min 1X/week     Co-evaluation               AM-PAC PT 6 Clicks Mobility  Outcome Measure Help needed turning from your back to your side while in a flat bed without using bedrails?: None Help needed moving from lying on your back to sitting on the side of a flat bed without using bedrails?: None Help needed moving to and from a bed to a chair (including a wheelchair)?: None Help needed standing up from a chair using your arms (e.g., wheelchair or bedside chair)?: None Help needed to walk in hospital room?: None Help needed climbing 3-5 steps with a railing? : A Little 6 Click Score: 23    End of Session Equipment Utilized During Treatment: Gait belt Activity Tolerance: Patient tolerated treatment well Patient left: with call bell/phone within reach;in chair Nurse  Communication: Mobility status PT Visit Diagnosis: Pain;Difficulty in walking, not elsewhere classified (R26.2) Pain - Right/Left: Left Pain - part of body: Hip    Time: 9140-9065 PT Time Calculation (min) (ACUTE ONLY): 35 min   Charges:   PT Evaluation $PT Eval Low Complexity: 1 Low PT Treatments $Gait Training: 8-22 mins PT General Charges $$ ACUTE PT VISIT: 1 Visit         Carmin JONELLE Deed, DPT 08/23/2023, 11:00 AM

## 2023-08-23 NOTE — Hospital Course (Addendum)
 Taken from H&P.  Debbie Sosa is a 48 y.o. female with medical history significant of Chronic low back pain bilateral lumbar radiculitis, Left greater trochanteric bursitis, chronic left hip pain and lumbar radiculopathy status post epidural injection on 08/21/2023 pending a left total hip arthroplasty in September 2025 presents emergency department for evaluation of constant, nonradiating left groin pain that started when she reached above her head earlier today to grab an empty bag.  Pain is exacerbated with any movement of the hip, somewhat relieved with lying flat.   On presentation hemodynamically stable, mild leukocytosis at 12, CT of left hip was negative for any acute osseous abnormality. Orthopedic surgery was consulted and patient was admitted for pain control.  MRI of hip and pelvis was ordered.  8/6: Vitals and labs stable, leukocytosis resolved.  MRI with some advancement of her chronic severe arthritis of hip, no significant acute changes.  Orthopedic surgery evaluated her and they were recommending continue outpatient follow-up with her surgeon, they are also going to message him to move up her surgery which is originally planned for September 8.  Patient was given muscle relaxant and pain medication to use as needed.  She will continue the rest of her home medications as she was doing it before and follow-up with her providers for further assistance.

## 2023-08-23 NOTE — Plan of Care (Signed)

## 2023-08-25 ENCOUNTER — Inpatient Hospital Stay: Payer: 59

## 2023-08-25 ENCOUNTER — Ambulatory Visit: Payer: 59 | Admitting: Nurse Practitioner

## 2023-08-31 ENCOUNTER — Other Ambulatory Visit: Payer: Self-pay | Admitting: Orthopedic Surgery

## 2023-09-08 ENCOUNTER — Other Ambulatory Visit: Payer: Self-pay

## 2023-09-08 ENCOUNTER — Encounter
Admission: RE | Admit: 2023-09-08 | Discharge: 2023-09-08 | Disposition: A | Discharge status: Home / Self Care | Source: Ambulatory Visit | Attending: Orthopedic Surgery | Admitting: Orthopedic Surgery

## 2023-09-08 DIAGNOSIS — Z01818 Encounter for other preprocedural examination: Secondary | ICD-10-CM | POA: Insufficient documentation

## 2023-09-08 DIAGNOSIS — Z01812 Encounter for preprocedural laboratory examination: Secondary | ICD-10-CM

## 2023-09-08 HISTORY — DX: Unspecified osteoarthritis, unspecified site: M19.90

## 2023-09-08 LAB — CBC WITH DIFFERENTIAL/PLATELET
Abs Immature Granulocytes: 0.02 K/uL (ref 0.00–0.07)
Basophils Absolute: 0 K/uL (ref 0.0–0.1)
Basophils Relative: 1 %
Eosinophils Absolute: 0.1 K/uL (ref 0.0–0.5)
Eosinophils Relative: 3 %
HCT: 38.4 % (ref 36.0–46.0)
Hemoglobin: 12.8 g/dL (ref 12.0–15.0)
Immature Granulocytes: 0 %
Lymphocytes Relative: 33 %
Lymphs Abs: 1.6 K/uL (ref 0.7–4.0)
MCH: 30.2 pg (ref 26.0–34.0)
MCHC: 33.3 g/dL (ref 30.0–36.0)
MCV: 90.6 fL (ref 80.0–100.0)
Monocytes Absolute: 0.5 K/uL (ref 0.1–1.0)
Monocytes Relative: 10 %
Neutro Abs: 2.6 K/uL (ref 1.7–7.7)
Neutrophils Relative %: 53 %
Platelets: 283 K/uL (ref 150–400)
RBC: 4.24 MIL/uL (ref 3.87–5.11)
RDW: 13.2 % (ref 11.5–15.5)
WBC: 4.8 K/uL (ref 4.0–10.5)
nRBC: 0 % (ref 0.0–0.2)

## 2023-09-08 LAB — URINALYSIS, ROUTINE W REFLEX MICROSCOPIC
Bilirubin Urine: NEGATIVE
Glucose, UA: NEGATIVE mg/dL
Ketones, ur: NEGATIVE mg/dL
Leukocytes,Ua: NEGATIVE
Nitrite: NEGATIVE
Protein, ur: NEGATIVE mg/dL
Specific Gravity, Urine: 1.018 (ref 1.005–1.030)
pH: 5 (ref 5.0–8.0)

## 2023-09-08 LAB — COMPREHENSIVE METABOLIC PANEL WITH GFR
ALT: 12 U/L (ref 0–44)
AST: 16 U/L (ref 15–41)
Albumin: 3.6 g/dL (ref 3.5–5.0)
Alkaline Phosphatase: 75 U/L (ref 38–126)
Anion gap: 11 (ref 5–15)
BUN: 12 mg/dL (ref 6–20)
CO2: 23 mmol/L (ref 22–32)
Calcium: 8.8 mg/dL — ABNORMAL LOW (ref 8.9–10.3)
Chloride: 105 mmol/L (ref 98–111)
Creatinine, Ser: 0.66 mg/dL (ref 0.44–1.00)
GFR, Estimated: >60 mL/min (ref >60)
Glucose, Bld: 46 mg/dL — ABNORMAL LOW (ref 70–99)
Potassium: 3.6 mmol/L (ref 3.5–5.1)
Sodium: 139 mmol/L (ref 135–145)
Total Bilirubin: 0.3 mg/dL (ref 0.0–1.2)
Total Protein: 6.9 g/dL (ref 6.5–8.1)

## 2023-09-08 LAB — TYPE AND SCREEN
ABO/RH(D): A POS
Antibody Screen: NEGATIVE

## 2023-09-08 LAB — SURGICAL PCR SCREEN
MRSA, PCR: NEGATIVE
Staphylococcus aureus: NEGATIVE

## 2023-09-08 NOTE — Patient Instructions (Addendum)
 Your procedure is scheduled on: 09/04.25 - Thursday Report to the Registration Desk on the 1st floor of the [facility]. To find out your arrival time, please call [phone] between 1PM - 3PM on: 09/20/23 - Wednesday If your arrival time is 6:00 am, do not arrive before that time as the [facility] entrance doors do not open until 6:00 am.  REMEMBER: Instructions that are not followed completely may result in serious medical risk, up to and including death; or upon the discretion of your surgeon and anesthesiologist your surgery may need to be rescheduled.  Do not eat food after midnight the night before surgery.  No gum chewing or hard candies.  You may however, drink CLEAR liquids up to 2 hours before you are scheduled to arrive for your surgery. Do not drink anything within 2 hours of your scheduled arrival time.  Clear liquids include: - water  - apple juice without pulp - gatorade (not RED colors) - black coffee or tea (Do NOT add milk or creamers to the coffee or tea) Do NOT drink anything that is not on this list.  In addition, your doctor has ordered for you to drink the provided:  Ensure Pre-Surgery Clear Carbohydrate Drink  Drinking this carbohydrate drink up to two hours before surgery helps to reduce insulin resistance and improve patient outcomes. Please complete drinking 3 hours before scheduled arrival time.  One week prior to surgery: Stop beginning 08/28, Anti-inflammatories (NSAIDS) such as Advil, Aleve, Ibuprofen, Motrin, Naproxen, Naprosyn and Aspirin based products such as Excedrin, Goody's Powder, BC Powder. You may take Tylenol  if needed for pain up until the day of surgery.  Stop ANY OVER THE COUNTER supplements until after surgery.  ON THE DAY OF SURGERY ONLY TAKE THESE MEDICATIONS WITH SIPS OF WATER:  esomeprazole  (NEXIUM )    Use albuterol  (VENTOLIN  HFA)  on the day of surgery and bring to the hospital.   No Alcohol for 24 hours before or after  surgery.  No Smoking including e-cigarettes for 24 hours before surgery.  No chewable tobacco products for at least 6 hours before surgery.  No nicotine patches on the day of surgery.  Do not use any recreational drugs for at least a week (preferably 2 weeks) before your surgery.  Please be advised that the combination of cocaine and anesthesia may have negative outcomes, up to and including death. If you test positive for cocaine, your surgery will be cancelled.  On the morning of surgery brush your teeth with toothpaste and water, you may rinse your mouth with mouthwash if you wish. Do not swallow any toothpaste or mouthwash.  Use CHG Soap or wipes as directed on instruction sheet.  Do not wear jewelry, make-up, hairpins, clips or nail polish.  For welded (permanent) jewelry: bracelets, anklets, waist bands, etc.  Please have this removed prior to surgery.  If it is not removed, there is a chance that hospital personnel will need to cut it off on the day of surgery.  Do not wear lotions, powders, or perfumes.   Do not shave body hair from the neck down 48 hours before surgery.  Contact lenses, hearing aids and dentures may not be worn into surgery.  Do not bring valuables to the hospital. [facility] is not responsible for any missing/lost belongings or valuables.   Notify your doctor if there is any change in your medical condition (cold, fever, infection).  Wear comfortable clothing (specific to your surgery type) to the hospital.  After surgery, you can help prevent lung complications by doing breathing exercises.  Take deep breaths and cough every 1-2 hours. Your doctor may order a device called an Incentive Spirometer to help you take deep breaths. When coughing or sneezing, hold a pillow firmly against your incision with both hands. This is called "splinting." Doing this helps protect your incision. It also decreases belly discomfort.  If you are being admitted to the  hospital overnight, leave your suitcase in the car. After surgery it may be brought to your room.  In case of increased patient census, it may be necessary for you, the patient, to continue your postoperative care in the Same Day Surgery department.  If you are being discharged the day of surgery, you will not be allowed to drive home. You will need a responsible individual to drive you home and stay with you for 24 hours after surgery.   If you are taking public transportation, you will need to have a responsible individual with you.  Please call the Pre-admissions Testing Dept. at [phone] if you have any questions about these instructions.  Surgery Visitation Policy:  Patients having surgery or a procedure may have two visitors.  Children under the age of 31 must have an adult with them who is not the patient.  Inpatient Visitation:    Visiting hours are 7 a.m. to 8 p.m. Up to four visitors are allowed at one time in a patient room. The visitors may rotate out with other people during the day.  One visitor age 7 or older may stay with the patient overnight and must be in the room by 8 p.m.   Merchandiser, retail to address health-related social needs:  [email or web address]     Pre-operative 5 CHG Bath Instructions   You can play a key role in reducing the risk of infection after surgery. Your skin needs to be as free of germs as possible. You can reduce the number of germs on your skin by washing with CHG (chlorhexidine  gluconate) soap before surgery. CHG is an antiseptic soap that kills germs and continues to kill germs even after washing.   DO NOT use if you have an allergy to chlorhexidine /CHG or antibacterial soaps. If your skin becomes reddened or irritated, stop using the CHG and notify one of our RNs at [phone].   Please shower with the CHG soap starting 4 days before surgery using the following schedule: 08/31 - 09/04.    Please keep in  mind the following:  DO NOT shave, including legs and underarms, starting the day of your first shower.   You may shave your face at any point before/day of surgery.  Place clean sheets on your bed the day you start using CHG soap. Use a clean washcloth (not used since being washed) for each shower. DO NOT sleep with pets once you start using the CHG.   CHG Shower Instructions:  If you choose to wash your hair and private area, wash first with your normal shampoo/soap.  After you use shampoo/soap, rinse your hair and body thoroughly to remove shampoo/soap residue.  Turn the water OFF and apply about 3 tablespoons (45 ml) of CHG soap to a CLEAN washcloth.  Apply CHG soap ONLY FROM YOUR NECK DOWN TO YOUR TOES (washing for 3-5 minutes)  DO NOT use CHG soap on face, private areas, open wounds, or sores.  Pay special attention to the area where your surgery is being performed.  If you are having  back surgery, having someone wash your back for you may be helpful. Wait 2 minutes after CHG soap is applied, then you may rinse off the CHG soap.  Pat dry with a clean towel  Put on clean clothes/pajamas   If you choose to wear lotion, please use ONLY the CHG-compatible lotions on the back of this paper.     Additional instructions for the day of surgery: DO NOT APPLY any lotions, deodorants, cologne, or perfumes.   Put on clean/comfortable clothes.  Brush your teeth.  Ask your nurse before applying any prescription medications to the skin.      CHG Compatible Lotions   Aveeno Moisturizing lotion  Cetaphil Moisturizing Cream  Cetaphil Moisturizing Lotion  Clairol Herbal Essence Moisturizing Lotion, Dry Skin  Clairol Herbal Essence Moisturizing Lotion, Extra Dry Skin  Clairol Herbal Essence Moisturizing Lotion, Normal Skin  Curel Age Defying Therapeutic Moisturizing Lotion with Alpha Hydroxy  Curel Extreme Care Body Lotion  Curel Soothing Hands Moisturizing Hand Lotion  Curel Therapeutic  Moisturizing Cream, Fragrance-Free  Curel Therapeutic Moisturizing Lotion, Fragrance-Free  Curel Therapeutic Moisturizing Lotion, Original Formula  Eucerin Daily Replenishing Lotion  Eucerin Dry Skin Therapy Plus Alpha Hydroxy Crme  Eucerin Dry Skin Therapy Plus Alpha Hydroxy Lotion  Eucerin Original Crme  Eucerin Original Lotion  Eucerin Plus Crme Eucerin Plus Lotion  Eucerin TriLipid Replenishing Lotion  Keri Anti-Bacterial Hand Lotion  Keri Deep Conditioning Original Lotion Dry Skin Formula Softly Scented  Keri Deep Conditioning Original Lotion, Fragrance Free Sensitive Skin Formula  Keri Lotion Fast Absorbing Fragrance Free Sensitive Skin Formula  Keri Lotion Fast Absorbing Softly Scented Dry Skin Formula  Keri Original Lotion  Keri Skin Renewal Lotion Keri Silky Smooth Lotion  Keri Silky Smooth Sensitive Skin Lotion  Nivea Body Creamy Conditioning Oil  Nivea Body Extra Enriched Lotion  Nivea Body Original Lotion  Nivea Body Sheer Moisturizing Lotion Nivea Crme  Nivea Skin Firming Lotion  NutraDerm 30 Skin Lotion  NutraDerm Skin Lotion  NutraDerm Therapeutic Skin Cream  NutraDerm Therapeutic Skin Lotion  ProShield Protective Hand Cream  Provon moisturizing lotion  How to Use an Incentive Spirometer  An incentive spirometer is a tool that measures how well you are filling your lungs with each breath. Learning to take long, deep breaths using this tool can help you keep your lungs clear and active. This may help to reverse or lessen your chance of developing breathing (pulmonary) problems, especially infection. You may be asked to use a spirometer: After a surgery. If you have a lung problem or a history of smoking. After a long period of time when you have been unable to move or be active. If the spirometer includes an indicator to show the highest number that you have reached, your health care provider or respiratory therapist will help you set a goal. Keep a log of your  progress as told by your health care provider. What are the risks? Breathing too quickly may cause dizziness or cause you to pass out. Take your time so you do not get dizzy or light-headed. If you are in pain, you may need to take pain medicine before doing incentive spirometry. It is harder to take a deep breath if you are having pain. How to use your incentive spirometer  Sit up on the edge of your bed or on a chair. Hold the incentive spirometer so that it is in an upright position. Before you use the spirometer, breathe out normally. Place the mouthpiece in your mouth.  Make sure your lips are closed tightly around it. Breathe in slowly and as deeply as you can through your mouth, causing the piston or the ball to rise toward the top of the chamber. Hold your breath for 3-5 seconds, or for as long as possible. If the spirometer includes a coach indicator, use this to guide you in breathing. Slow down your breathing if the indicator goes above the marked areas. Remove the mouthpiece from your mouth and breathe out normally. The piston or ball will return to the bottom of the chamber. Rest for a few seconds, then repeat the steps 10 or more times. Take your time and take a few normal breaths between deep breaths so that you do not get dizzy or light-headed. Do this every 1-2 hours when you are awake. If the spirometer includes a goal marker to show the highest number you have reached (best effort), use this as a goal to work toward during each repetition. After each set of 10 deep breaths, cough a few times. This will help to make sure that your lungs are clear. If you have an incision on your chest or abdomen from surgery, place a pillow or a rolled-up towel firmly against the incision when you cough. This can help to reduce pain while taking deep breaths and coughing. General tips When you are able to get out of bed: Walk around often. Continue to take deep breaths and cough in order to  clear your lungs. Keep using the incentive spirometer until your health care provider says it is okay to stop using it. If you have been in the hospital, you may be told to keep using the spirometer at home. Contact a health care provider if: You are having difficulty using the spirometer. You have trouble using the spirometer as often as instructed. Your pain medicine is not giving enough relief for you to use the spirometer as told. You have a fever. Get help right away if: You develop shortness of breath. You develop a cough with bloody mucus from the lungs. You have fluid or blood coming from an incision site after you cough. Summary An incentive spirometer is a tool that can help you learn to take long, deep breaths to keep your lungs clear and active. You may be asked to use a spirometer after a surgery, if you have a lung problem or a history of smoking, or if you have been inactive for a long period of time. Use your incentive spirometer as instructed every 1-2 hours while you are awake. If you have an incision on your chest or abdomen, place a pillow or a rolled-up towel firmly against your incision when you cough. This will help to reduce pain. Get help right away if you have shortness of breath, you cough up bloody mucus, or blood comes from your incision when you cough. This information is not intended to replace advice given to you by your health care provider. Make sure you discuss any questions you have with your health care provider. Document Revised: 03/25/2019 Document Reviewed: 03/25/2019 Elsevier Patient Education  2023 [employer]   Preoperative Educational Videos for Total Hip, Knee and Shoulder Replacements  To better prepare for surgery, please view our videos that explain the physical activity and discharge planning required to have the best surgical recovery at [facility].  [email or web address]  Questions? Call [phone] or email [email or web address]

## 2023-09-11 ENCOUNTER — Ambulatory Visit (INDEPENDENT_AMBULATORY_CARE_PROVIDER_SITE_OTHER): Payer: Self-pay | Admitting: Vascular Surgery

## 2023-09-11 ENCOUNTER — Encounter (INDEPENDENT_AMBULATORY_CARE_PROVIDER_SITE_OTHER): Payer: Self-pay | Admitting: Vascular Surgery

## 2023-09-11 ENCOUNTER — Encounter: Payer: Self-pay | Admitting: Internal Medicine

## 2023-09-11 VITALS — BP 116/79 | HR 82 | Ht 61.0 in | Wt 200.0 lb

## 2023-09-11 DIAGNOSIS — I2782 Chronic pulmonary embolism: Secondary | ICD-10-CM | POA: Diagnosis not present

## 2023-09-11 DIAGNOSIS — I872 Venous insufficiency (chronic) (peripheral): Secondary | ICD-10-CM | POA: Diagnosis not present

## 2023-09-11 DIAGNOSIS — I890 Lymphedema, not elsewhere classified: Secondary | ICD-10-CM

## 2023-09-11 DIAGNOSIS — M1612 Unilateral primary osteoarthritis, left hip: Secondary | ICD-10-CM

## 2023-09-11 NOTE — Progress Notes (Signed)
 MRN : [id number]  Debbie Sosa is a 48 y.o. (01-22-1975) female who presents with chief complaint of legs hurt and swell.  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  PE was identified years ago and was treated with anticoagulation.  The presenting symptoms were pain and swelling in the lower extremity.  The patient notes her legs continue to be mildly painful with dependency and still swells with long periods of dependency.  Symptoms are much better with elevation.  The patient notes minimal edema in the morning which increases to some degree throughout the day.    The patient has not been using compression therapy routinely.  No SOB or pleuritic chest pains.  No cough or hemoptysis.  The patient is currently not on anticoagulation. No blood per rectum or blood in any sputum.  No excessive bruising per the patient.    No recent shortening of the patient's walking distance or new symptoms consistent with claudication.  No history of rest pain symptoms. No new ulcers or wounds of the lower extremities have occurred.  The patient denies amaurosis fugax or recent TIA symptoms. There are no recent neurological changes noted. No recent episodes of angina or shortness of breath documented.   Current Meds  Medication Sig   albuterol  (VENTOLIN  HFA) 108 (90 Base) MCG/ACT inhaler Inhale 1-2 puffs INH Q4-6hr prn for chest tightness, cough, wheezing, SOB/DOE.   cyanocobalamin  (,VITAMIN B-12,) 1000 MCG/ML injection Inject 1,000 mcg into the muscle once a week.   diphenhydrAMINE  (BENADRYL ) 25 mg capsule Take 1 capsule (25 mg total) by mouth every 6 (six) hours as needed for itching or allergies.   EPINEPHrine  (EPIPEN  2-PAK) 0.3 mg/0.3 mL IJ SOAJ injection Inject 0.3 mLs (0.3 mg total) into the muscle as needed for anaphylaxis.   esomeprazole  (NEXIUM ) 40 MG capsule Take 1  capsule (40 mg total) by mouth daily at 12 noon.   famotidine  (PEPCID ) 20 MG tablet Take 1 tablet (20 mg total) by mouth 2 (two) times daily. (Patient taking differently: Take 20 mg by mouth as needed.)   HYDROcodone -acetaminophen  (NORCO/VICODIN) 5-325 MG tablet Take 2 tablets by mouth every 4 (four) hours as needed for moderate pain (pain score 4-6).   levocetirizine (XYZAL ) 5 MG tablet Take 5 mg by mouth every morning.   levonorgestrel  (MIRENA ) 20 MCG/DAY IUD by Intrauterine route.   methocarbamol  (ROBAXIN ) 500 MG tablet Take 1 tablet (500 mg total) by mouth every 8 (eight) hours as needed for muscle spasms.   Vitamin D , Ergocalciferol , (DRISDOL ) 1.25 MG (50000 UT) CAPS capsule Take 50,000 Units by mouth once a week. Mondays    Past Medical History:  Diagnosis Date   Anemia    Antiphospholipid antibody positive    Arthritis    Asthma    seasonal   Chronic venous insufficiency    COVID-19 2020   DDD (degenerative disc disease), lumbar    GERD (gastroesophageal reflux disease)    Headache    h/o migraines   History of hiatal hernia    repaired with Gastric bypass   History of kidney stones    Iron  deficiency    recieves iron  infusions every 3 months at the cancer center   Lymphedema    bil legs-sees [place] [facility]   Nausea and vomiting 10/14/2015   Obesity    Pneumonia    Pulmonary  embolism (HCC)    after 2nd c-section   Sleep apnea    had gastric bypass and does not need to use cpap since surgery   Vitamin B12 deficiency     Past Surgical History:  Procedure Laterality Date   BREAST BIOPSY Left 2006   benign/clip   BREAST BIOPSY Right 2015   benign/clip   CESAREAN SECTION     CESAREAN SECTION     CHOLECYSTECTOMY     COLONOSCOPY     COLONOSCOPY WITH PROPOFOL  N/A 10/31/2018   Procedure: COLONOSCOPY WITH PROPOFOL ;  Surgeon: Therisa Bi, MD;  Location: [facility] ENDOSCOPY;  Service: Gastroenterology;  Laterality: N/A;   ESOPHAGOGASTRODUODENOSCOPY (EGD) WITH PROPOFOL  N/A  10/04/2016   Procedure: ESOPHAGOGASTRODUODENOSCOPY (EGD) WITH PROPOFOL ;  Surgeon: Therisa Bi, MD;  Location: [facility] ENDOSCOPY;  Service: Gastroenterology;  Laterality: N/A;   ESOPHAGOGASTRODUODENOSCOPY (EGD) WITH PROPOFOL  N/A 04/14/2017   Procedure: ESOPHAGOGASTRODUODENOSCOPY (EGD) WITH PROPOFOL ;  Surgeon: Therisa Bi, MD;  Location: [facility] ENDOSCOPY;  Service: Gastroenterology;  Laterality: N/A;   ESOPHAGOGASTRODUODENOSCOPY (EGD) WITH PROPOFOL  N/A 10/31/2018   Procedure: ESOPHAGOGASTRODUODENOSCOPY (EGD) WITH PROPOFOL ;  Surgeon: Therisa Bi, MD;  Location: [facility] ENDOSCOPY;  Service: Gastroenterology;  Laterality: N/A;   ESOPHAGOGASTRODUODENOSCOPY (EGD) WITH PROPOFOL  N/A 06/28/2019   Procedure: ESOPHAGOGASTRODUODENOSCOPY (EGD) WITH PROPOFOL ;  Surgeon: Therisa Bi, MD;  Location: [facility] ENDOSCOPY;  Service: Gastroenterology;  Laterality: N/A;   ESOPHAGOGASTRODUODENOSCOPY (EGD) WITH PROPOFOL  N/A 02/10/2021   Procedure: ESOPHAGOGASTRODUODENOSCOPY (EGD) WITH PROPOFOL ;  Surgeon: Therisa Bi, MD;  Location: [facility] ENDOSCOPY;  Service: Gastroenterology;  Laterality: N/A;   GASTRIC BYPASS     01/2015   HERNIA REPAIR     repaired with gastric bypass - Hiatel hernia   HYSTEROSCOPY WITH D & C N/A 09/17/2021   Procedure: DILATATION AND CURETTAGE /HYSTEROSCOPY;  Surgeon: Leonce Garnette BIRCH, MD;  Location: [facility] ORS;  Service: Gynecology;  Laterality: N/A;   INTRAUTERINE DEVICE (IUD) INSERTION N/A 09/17/2021   Procedure: INTRAUTERINE DEVICE (IUD) INSERTION - MIRENA ;  Surgeon: Leonce Garnette BIRCH, MD;  Location: [facility] ORS;  Service: Gynecology;  Laterality: N/A;   IUD REMOVAL N/A 09/17/2021   Procedure: INTRAUTERINE DEVICE (IUD) REMOVAL;  Surgeon: Leonce Garnette BIRCH, MD;  Location: [facility] ORS;  Service: Gynecology;  Laterality: N/A;   KNEE SURGERY Right    NASAL SINUS SURGERY     PLANTAR FASCIA SURGERY     x 3   REDUCTION MAMMAPLASTY Bilateral 2005    Social History Social History   Tobacco Use   Smoking status:  Never   Smokeless tobacco: Never  Vaping Use   Vaping status: Never Used  Substance Use Topics   Alcohol use: No   Drug use: No    Family History Family History  Problem Relation Age of Onset   Breast cancer Mother 77   Breast cancer Cousin 30   Breast cancer Cousin 58   Asthma Father    Asthma Sister    Asthma Child     Allergies  Allergen Reactions   Amoxicillin  Anaphylaxis    Throat swelling   Avocado Anaphylaxis   Cephalexin  Hives and Rash   Fexofenadine Shortness Of Breath   Iodinated Contrast Media Hives   Ketorolac  Hives   Morphine Anaphylaxis   Penicillins Swelling    Throat swelling    Promethazine Hives   Diflucan  [Fluconazole ] Rash   Apple     Patient denies   Cetirizine Other (See Comments)    Skin eruptures Patient denies   Diclofenac  Sodium Hives   Doxycycline Other (See Comments)  Latex     With condoms   Other Hives    Pain medicine that starts with an L   Tramadol  Hives    Can take with benadryl    Hepatitis B Virus Vaccines Rash   Misc. Sulfonamide Containing Compounds Rash   Morphine And Codeine Rash   Naproxen Rash   Oxycodone -Acetaminophen  Itching   Pyridium [Phenazopyridine Hcl] Rash   Sulfa Antibiotics Rash   Sulfasalazine Rash   Vioxx [Rofecoxib] Rash     REVIEW OF SYSTEMS (Negative unless checked)  Constitutional: [] Weight loss  [] Fever  [] Chills Cardiac: [] Chest pain   [] Chest pressure   [] Palpitations   [] Shortness of breath when laying flat   [] Shortness of breath with exertion. Vascular:  [] Pain in legs with walking   [x] Pain in legs at rest  [] History of DVT   [] Phlebitis   [x] Swelling in legs   [] Varicose veins   [] Non-healing ulcers Pulmonary:   [] Uses home oxygen   [] Productive cough   [] Hemoptysis   [] Wheeze  [] COPD   [] Asthma Neurologic:  [] Dizziness   [] Seizures   [] History of stroke   [] History of TIA  [] Aphasia   [] Vissual changes   [] Weakness or numbness in arm   [] Weakness or numbness in leg Musculoskeletal:    [] Joint swelling   [] Joint pain   [] Low back pain Hematologic:  [] Easy bruising  [] Easy bleeding   [] Hypercoagulable state   [] Anemic Gastrointestinal:  [] Diarrhea   [] Vomiting  [] Gastroesophageal reflux/heartburn   [] Difficulty swallowing. Genitourinary:  [] Chronic kidney disease   [] Difficult urination  [] Frequent urination   [] Blood in urine Skin:  [] Rashes   [] Ulcers  Psychological:  [] History of anxiety   []  History of major depression.  Physical Examination  Vitals:   09/11/23 1533  BP: 116/79  Pulse: 82  Weight: 200 lb (90.7 kg)  Height: 5' 1 (1.549 m)   Body mass index is 37.79 kg/m. Gen: WD/WN, NAD Head: [place]/AT, No temporalis wasting.  Ear/Nose/Throat: Hearing grossly intact, nares w/o erythema or drainage, pinna without lesions Eyes: PER, EOMI, sclera nonicteric.  Neck: Supple, no gross masses.  No JVD.  Pulmonary:  Good air movement, no audible wheezing, no use of accessory muscles.  Cardiac: RRR, precordium not hyperdynamic. Vascular:  scattered varicosities present bilaterally.  Moderate venous stasis changes to the legs bilaterally.  2+ soft pitting edema. CEAP [id number]   Vessel Right Left  Radial Palpable Palpable  Gastrointestinal: soft, non-distended. No guarding/no peritoneal signs.  Musculoskeletal: M/S 5/5 throughout.  No deformity.  Neurologic: CN 2-12 intact. Pain and light touch intact in extremities.  Symmetrical.  Speech is fluent. Motor exam as listed above. Psychiatric: Judgment intact, Mood & affect appropriate for pt's clinical situation. Dermatologic: Venous rashes no ulcers noted.  No changes consistent with cellulitis. Lymph : No lichenification or skin changes of chronic lymphedema.  CBC Lab Results  Component Value Date   WBC 4.8 09/08/2023   HGB 12.8 09/08/2023   HCT 38.4 09/08/2023   MCV 90.6 09/08/2023   PLT 283 09/08/2023    BMET    Component Value Date/Time   NA 139 09/08/2023 0939   NA 140 08/23/2013 1720   K 3.6 09/08/2023  0939   K 3.7 08/23/2013 1720   CL 105 09/08/2023 0939   CL 105 08/23/2013 1720   CO2 23 09/08/2023 0939   CO2 25 08/23/2013 1720   GLUCOSE 46 (L) 09/08/2023 0939   GLUCOSE 86 08/23/2013 1720   BUN 12 09/08/2023 0939   BUN 7  08/23/2013 1720   CREATININE 0.66 09/08/2023 0939   CREATININE 0.77 03/07/2023 1522   CREATININE 0.84 08/23/2013 1720   CALCIUM 8.8 (L) 09/08/2023 0939   CALCIUM 8.6 08/23/2013 1720   GFRNONAA >60 09/08/2023 0939   GFRNONAA >60 03/07/2023 1522   GFRNONAA >60 08/23/2013 1720   GFRAA >60 01/17/2019 0604   GFRAA >60 08/23/2013 1720   Estimated Creatinine Clearance: 88.2 mL/min (by C-G formula based on SCr of 0.66 mg/dL).  COAG Lab Results  Component Value Date   INR 1.0 05/11/2018    Radiology MR PELVIS WO CONTRAST Result Date: 08/23/2023 CLINICAL DATA:  Acute on chronic left hip pain. EXAM: MRI PELVIS WITHOUT CONTRAST TECHNIQUE: Multiplanar multisequence MR imaging of the pelvis was performed. No intravenous contrast was administered. COMPARISON:  CT of the left hip dated 08/22/2023. MRI of the left hip dated 11/19/2022. FINDINGS: Left hip: Severe superior joint space narrowing of the left hip with near full-thickness chondral loss of the central weight-bearing portion resulting in bone-on-bone contact with regions of femoral head subchondral flattening and depression and pronounced T2/STIR hyperintense marrow edema of the left femoral head and neck. Mild marrow edema at the superolateral acetabulum. There is a small left hip joint effusion. 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 T2 hyperintense signal at the base of the anterior superior labrum is concerning for a tear. Bones: The remainder of the visualized bones demonstrate normal marrow signal intensity. Right hip  demonstrates similar mild joint space narrowing with no joint effusion. Sacroiliac joints are anatomically aligned with mild degenerative arthropathy. Pubic symphysis is unremarkable. Soft tissue and Muscle: There is no obvious soft tissue swelling. Muscles demonstrate normal morphology and signal intensity.Hamstring tendon origins are intact.Partial-thickness undersurface tear of the left gluteus minimus cuff insertion is again noted with mild tendinosis. Mild tendinosis of the right gluteus medius cuff insertion with low-grade partial-thickness undersurface tear.Visualized intrapelvic contents are grossly unremarkable IMPRESSION: 1. Severe superior joint space narrowing of the left hip with near full-thickness chondral loss of the central weight-bearing portion resulting in bone-on-bone contact with regions of femoral head subchondral flattening and depression and marrow edema of the left femoral head and neck. Mild marrow edema at the superolateral acetabulum. There is a small left hip joint effusion. 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 2. Tear of the left anterosuperior labrum. 3. Mild similar joint space narrowing of the right hip without joint effusion. 4. Redemonstrated partial-thickness undersurface tear of the left gluteus minimus cuff insertion with mild tendinosis. 5. Mild tendinosis of the right gluteus medius cuff insertion with low-grade partial-thickness undersurface tear. Electronically Signed   By: Harrietta Sherry M.D.   On: 08/23/2023 09:46   MR HIP LEFT WO CONTRAST Result Date: 08/23/2023 CLINICAL DATA:  Acute on chronic left hip pain. EXAM: MRI PELVIS WITHOUT CONTRAST TECHNIQUE: Multiplanar multisequence MR imaging of the pelvis was performed. No intravenous contrast was administered.  COMPARISON:  CT of the left hip dated 08/22/2023. MRI of the left hip dated 11/19/2022. FINDINGS: Left hip: Severe superior joint space narrowing of the left hip with near full-thickness chondral loss of the central weight-bearing portion resulting in bone-on-bone contact with regions of femoral  head subchondral flattening and depression and pronounced T2/STIR hyperintense marrow edema of the left femoral head and neck. Mild marrow edema at the superolateral acetabulum. There is a small left hip joint effusion. 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 T2 hyperintense signal at the base of the anterior superior labrum is concerning for a tear. Bones: The remainder of the visualized bones demonstrate normal marrow signal intensity. Right hip demonstrates similar mild joint space narrowing with no joint effusion. Sacroiliac joints are anatomically aligned with mild degenerative arthropathy. Pubic symphysis is unremarkable. Soft tissue and Muscle: There is no obvious soft tissue swelling. Muscles demonstrate normal morphology and signal intensity.Hamstring tendon origins are intact.Partial-thickness undersurface tear of the left gluteus minimus cuff insertion is again noted with mild tendinosis. Mild tendinosis of the right gluteus medius cuff insertion with low-grade partial-thickness undersurface tear.Visualized intrapelvic contents are grossly unremarkable IMPRESSION: 1. Severe superior joint space narrowing of the left hip with near full-thickness chondral loss of the central weight-bearing portion resulting in bone-on-bone contact with regions of femoral head subchondral flattening and depression and marrow edema of the left femoral head and neck. Mild marrow edema at the superolateral acetabulum. There is a small left  hip joint effusion. 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 2. Tear of the left anterosuperior labrum. 3. Mild similar joint space narrowing of the right hip without joint effusion. 4. Redemonstrated partial-thickness undersurface tear of the left gluteus minimus cuff insertion with mild tendinosis. 5. Mild tendinosis of the right gluteus medius cuff insertion with low-grade partial-thickness undersurface tear. Electronically Signed   By: Harrietta Sherry M.D.   On: 08/23/2023 09:46   CT Hip Left Wo Contrast Result Date: 08/22/2023 CLINICAL DATA:  Acute on chronic left hip pain. EXAM: CT OF THE LEFT HIP WITHOUT CONTRAST TECHNIQUE: Multidetector CT imaging of the left hip was performed according to the standard protocol. Multiplanar CT image reconstructions were also generated. RADIATION DOSE REDUCTION: This exam was performed according to the departmental dose-optimization program which includes automated exposure control, adjustment of the mA and/or kV according to patient size and/or use of iterative reconstruction technique. COMPARISON:  Left hip radiographs dated 08/22/2023. MRI of the left hip dated 11/19/2022. FINDINGS: Bones/Joint/Cartilage No acute fracture or dislocation. The left femoral head is seated within the acetabulum. Severe superior joint space narrowing of the left hip with associated acetabular and humeral head osteophytosis, and subchondral sclerosis. Left greater trochanteric enthesopathy. Visualized left sacroiliac joint and pubic symphysis are anatomically aligned. No sizable joint effusion. Ligaments Ligaments are suboptimally evaluated by CT. Muscles and Tendons Muscles are normal. No muscle atrophy. No intramuscular fluid collection or hematoma. Soft tissue No fluid collection or hematoma.  No soft tissue mass. Partially visualized IUD in place. IMPRESSION: 1. No acute osseous abnormality. 2. Severe superior joint space narrowing of the left hip with osteophytosis and subchondral sclerosis. Electronically Signed   By: Harrietta Sherry M.D.   On: 08/22/2023 14:40   DG Hip Unilat W or Wo Pelvis 2-3 Views Left Result Date: 08/22/2023 EXAM: 2 or 3 VIEW(S) XRAY OF THE LEFT HIP 08/22/2023 11:25:00 AM COMPARISON: 12/16/2019, number 2 [phone] CLINICAL HISTORY: Pain unable to bear weight.  Patient complains of left hip pain; unable to bear weight. FINDINGS: BONES AND JOINTS: Worsening joint space loss of the left hip with near bone-on-bone articulation. Mild joint space loss of the right hip. Degenerative disc disease of the visualized lumbar spine. SOFT TISSUES: Intruterine device overlying the midline pelvis. Multiple pelvic phleboliths. IMPRESSION: 1. Progressive joint space loss of the left hip with near bone-on-bone articulation, consistent with worsening osteoarthritis. Electronically signed by: Rogelia Myers MD 08/22/2023 12:34 PM EDT RP Workstation: [id number]     Assessment/Plan 1. Chronic pulmonary embolism, unspecified pulmonary embolism type, unspecified whether acute cor pulmonale present (HCC) (Primary) The patient will continue anticoagulation for now as there have not been any problems or complications at this point.  IVC filter is strongly indicated prior to high risk orthopedic surgery.  Especially given the history of PE / DVT.  IVC filter placement will be done the week for surgery. Risk and benefits were reviewed the patient.  Indications for the procedure were reviewed.  All questions were answered, the patient agrees to proceed.   Elevation was stressed, such as the use of a recliner.  I have reviewed with the patient DVT and post phlebitic changes such as swelling and why it  causes symptoms such as pain.  I recommended to the patient to wear graduated compression stockings,  beginning after three full days of anticoagulation.  Graduated compression should be worn on a daily basis. The patient should wear compression beginning first thing in the morning and removing them in the evening. The patient is instructed specifically not to sleep in the stockings.  In addition, behavioral modification including elevation during the day and avoidance of prolonged dependency will be initiated.    The patient will follow-up with me 2-3 months after the joint replacement surgery to discuss removal (this was also discussed today and the patient agrees with the plan to have the filter removed).   2. Chronic venous insufficiency The patient will continue anticoagulation for now as there have not been any problems or complications at this point.  IVC filter is strongly indicated prior to high risk orthopedic surgery.  Especially given the history of PE / DVT.  IVC filter placement will be done the week for surgery. Risk and benefits were reviewed the patient.  Indications for the procedure were reviewed.  All questions were answered, the patient agrees to proceed.   Elevation was stressed, such as the use of a recliner.  I have reviewed with the patient DVT and post phlebitic changes such as swelling and why it  causes symptoms such as pain.  I recommended to the patient to wear graduated compression stockings, beginning after three full days of anticoagulation.  Graduated compression should be worn on a daily basis. The patient should wear compression beginning first thing in the morning and removing them in the evening. The patient is instructed specifically not to sleep in the stockings.  In addition, behavioral modification including elevation during the day and avoidance of prolonged dependency will be initiated.    The patient will follow-up with me 2-3 months after the joint replacement surgery to discuss removal (this was also discussed today and the patient agrees with the plan to  have the filter removed).   3. Primary osteoarthritis of left hip Continue medications to treat the patient's degenerative disease as already ordered, these medications have been reviewed and there are no changes at this time.  She is scheduled for her left hip surgery this Thursday, September 4.  IVC filter will be placed Tuesday, September 2  Continued activity and therapy was stressed.  4. Lymphedema Recommend:  No surgery or intervention at this point in time.    I have reviewed my discussion with the patient regarding venous insufficiency and secondary lymph edema and why it  causes symptoms. I have discussed with the patient the chronic skin changes that accompany these problems and the long term sequela such as ulceration and infection.  Patient will continue wearing graduated compression on a daily basis a prescription, if needed, was given to the patient to keep this updated. The patient will  put the compression on first thing in the morning and removing them in the evening. The patient is instructed specifically not to sleep in the compression.  In addition, behavioral modification including elevation during the day will be continued.  Diet and salt restriction will also be helpful.  Previous duplex ultrasound of the lower extremities shows normal deep venous system, superficial reflux was not present.   Following the review of the ultrasound the patient will follow up in 12 months to reassess the degree of swelling and the control that graduated compression is offering.   The patient can be assessed for a Lymph Pump at that time.  However, at this time the patient states they are satisfied with the control compression and elevation is yielding.      Cordella Shawl, MD  09/11/2023 3:41 PM

## 2023-09-11 NOTE — H&P (View-Only) (Signed)
 MRN : [id number]  Debbie Sosa is a 47 y.o. (01-22-1975) female who presents with chief complaint of legs hurt and swell.  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  PE was identified years ago and was treated with anticoagulation.  The presenting symptoms were pain and swelling in the lower extremity.  The patient notes her legs continue to be mildly painful with dependency and still swells with long periods of dependency.  Symptoms are much better with elevation.  The patient notes minimal edema in the morning which increases to some degree throughout the day.    The patient has not been using compression therapy routinely.  No SOB or pleuritic chest pains.  No cough or hemoptysis.  The patient is currently not on anticoagulation. No blood per rectum or blood in any sputum.  No excessive bruising per the patient.    No recent shortening of the patient's walking distance or new symptoms consistent with claudication.  No history of rest pain symptoms. No new ulcers or wounds of the lower extremities have occurred.  The patient denies amaurosis fugax or recent TIA symptoms. There are no recent neurological changes noted. No recent episodes of angina or shortness of breath documented.   Current Meds  Medication Sig   albuterol  (VENTOLIN  HFA) 108 (90 Base) MCG/ACT inhaler Inhale 1-2 puffs INH Q4-6hr prn for chest tightness, cough, wheezing, SOB/DOE.   cyanocobalamin  (,VITAMIN B-12,) 1000 MCG/ML injection Inject 1,000 mcg into the muscle once a week.   diphenhydrAMINE  (BENADRYL ) 25 mg capsule Take 1 capsule (25 mg total) by mouth every 6 (six) hours as needed for itching or allergies.   EPINEPHrine  (EPIPEN  2-PAK) 0.3 mg/0.3 mL IJ SOAJ injection Inject 0.3 mLs (0.3 mg total) into the muscle as needed for anaphylaxis.   esomeprazole  (NEXIUM ) 40 MG capsule Take 1  capsule (40 mg total) by mouth daily at 12 noon.   famotidine  (PEPCID ) 20 MG tablet Take 1 tablet (20 mg total) by mouth 2 (two) times daily. (Patient taking differently: Take 20 mg by mouth as needed.)   HYDROcodone -acetaminophen  (NORCO/VICODIN) 5-325 MG tablet Take 2 tablets by mouth every 4 (four) hours as needed for moderate pain (pain score 4-6).   levocetirizine (XYZAL ) 5 MG tablet Take 5 mg by mouth every morning.   levonorgestrel  (MIRENA ) 20 MCG/DAY IUD by Intrauterine route.   methocarbamol  (ROBAXIN ) 500 MG tablet Take 1 tablet (500 mg total) by mouth every 8 (eight) hours as needed for muscle spasms.   Vitamin D , Ergocalciferol , (DRISDOL ) 1.25 MG (50000 UT) CAPS capsule Take 50,000 Units by mouth once a week. Mondays    Past Medical History:  Diagnosis Date   Anemia    Antiphospholipid antibody positive    Arthritis    Asthma    seasonal   Chronic venous insufficiency    COVID-19 2020   DDD (degenerative disc disease), lumbar    GERD (gastroesophageal reflux disease)    Headache    h/o migraines   History of hiatal hernia    repaired with Gastric bypass   History of kidney stones    Iron  deficiency    recieves iron  infusions every 3 months at the cancer center   Lymphedema    bil legs-sees [place] [facility]   Nausea and vomiting 10/14/2015   Obesity    Pneumonia    Pulmonary  embolism (HCC)    after 2nd c-section   Sleep apnea    had gastric bypass and does not need to use cpap since surgery   Vitamin B12 deficiency     Past Surgical History:  Procedure Laterality Date   BREAST BIOPSY Left 2006   benign/clip   BREAST BIOPSY Right 2015   benign/clip   CESAREAN SECTION     CESAREAN SECTION     CHOLECYSTECTOMY     COLONOSCOPY     COLONOSCOPY WITH PROPOFOL  N/A 10/31/2018   Procedure: COLONOSCOPY WITH PROPOFOL ;  Surgeon: Therisa Bi, MD;  Location: [facility] ENDOSCOPY;  Service: Gastroenterology;  Laterality: N/A;   ESOPHAGOGASTRODUODENOSCOPY (EGD) WITH PROPOFOL  N/A  10/04/2016   Procedure: ESOPHAGOGASTRODUODENOSCOPY (EGD) WITH PROPOFOL ;  Surgeon: Therisa Bi, MD;  Location: [facility] ENDOSCOPY;  Service: Gastroenterology;  Laterality: N/A;   ESOPHAGOGASTRODUODENOSCOPY (EGD) WITH PROPOFOL  N/A 04/14/2017   Procedure: ESOPHAGOGASTRODUODENOSCOPY (EGD) WITH PROPOFOL ;  Surgeon: Therisa Bi, MD;  Location: [facility] ENDOSCOPY;  Service: Gastroenterology;  Laterality: N/A;   ESOPHAGOGASTRODUODENOSCOPY (EGD) WITH PROPOFOL  N/A 10/31/2018   Procedure: ESOPHAGOGASTRODUODENOSCOPY (EGD) WITH PROPOFOL ;  Surgeon: Therisa Bi, MD;  Location: [facility] ENDOSCOPY;  Service: Gastroenterology;  Laterality: N/A;   ESOPHAGOGASTRODUODENOSCOPY (EGD) WITH PROPOFOL  N/A 06/28/2019   Procedure: ESOPHAGOGASTRODUODENOSCOPY (EGD) WITH PROPOFOL ;  Surgeon: Therisa Bi, MD;  Location: [facility] ENDOSCOPY;  Service: Gastroenterology;  Laterality: N/A;   ESOPHAGOGASTRODUODENOSCOPY (EGD) WITH PROPOFOL  N/A 02/10/2021   Procedure: ESOPHAGOGASTRODUODENOSCOPY (EGD) WITH PROPOFOL ;  Surgeon: Therisa Bi, MD;  Location: [facility] ENDOSCOPY;  Service: Gastroenterology;  Laterality: N/A;   GASTRIC BYPASS     01/2015   HERNIA REPAIR     repaired with gastric bypass - Hiatel hernia   HYSTEROSCOPY WITH D & C N/A 09/17/2021   Procedure: DILATATION AND CURETTAGE /HYSTEROSCOPY;  Surgeon: Leonce Garnette BIRCH, MD;  Location: [facility] ORS;  Service: Gynecology;  Laterality: N/A;   INTRAUTERINE DEVICE (IUD) INSERTION N/A 09/17/2021   Procedure: INTRAUTERINE DEVICE (IUD) INSERTION - MIRENA ;  Surgeon: Leonce Garnette BIRCH, MD;  Location: [facility] ORS;  Service: Gynecology;  Laterality: N/A;   IUD REMOVAL N/A 09/17/2021   Procedure: INTRAUTERINE DEVICE (IUD) REMOVAL;  Surgeon: Leonce Garnette BIRCH, MD;  Location: [facility] ORS;  Service: Gynecology;  Laterality: N/A;   KNEE SURGERY Right    NASAL SINUS SURGERY     PLANTAR FASCIA SURGERY     x 3   REDUCTION MAMMAPLASTY Bilateral 2005    Social History Social History   Tobacco Use   Smoking status:  Never   Smokeless tobacco: Never  Vaping Use   Vaping status: Never Used  Substance Use Topics   Alcohol use: No   Drug use: No    Family History Family History  Problem Relation Age of Onset   Breast cancer Mother 77   Breast cancer Cousin 30   Breast cancer Cousin 58   Asthma Father    Asthma Sister    Asthma Child     Allergies  Allergen Reactions   Amoxicillin  Anaphylaxis    Throat swelling   Avocado Anaphylaxis   Cephalexin  Hives and Rash   Fexofenadine Shortness Of Breath   Iodinated Contrast Media Hives   Ketorolac  Hives   Morphine Anaphylaxis   Penicillins Swelling    Throat swelling    Promethazine Hives   Diflucan  [Fluconazole ] Rash   Apple     Patient denies   Cetirizine Other (See Comments)    Skin eruptures Patient denies   Diclofenac  Sodium Hives   Doxycycline Other (See Comments)  Latex     With condoms   Other Hives    Pain medicine that starts with an L   Tramadol  Hives    Can take with benadryl    Hepatitis B Virus Vaccines Rash   Misc. Sulfonamide Containing Compounds Rash   Morphine And Codeine Rash   Naproxen Rash   Oxycodone -Acetaminophen  Itching   Pyridium [Phenazopyridine Hcl] Rash   Sulfa Antibiotics Rash   Sulfasalazine Rash   Vioxx [Rofecoxib] Rash     REVIEW OF SYSTEMS (Negative unless checked)  Constitutional: [] Weight loss  [] Fever  [] Chills Cardiac: [] Chest pain   [] Chest pressure   [] Palpitations   [] Shortness of breath when laying flat   [] Shortness of breath with exertion. Vascular:  [] Pain in legs with walking   [x] Pain in legs at rest  [] History of DVT   [] Phlebitis   [x] Swelling in legs   [] Varicose veins   [] Non-healing ulcers Pulmonary:   [] Uses home oxygen   [] Productive cough   [] Hemoptysis   [] Wheeze  [] COPD   [] Asthma Neurologic:  [] Dizziness   [] Seizures   [] History of stroke   [] History of TIA  [] Aphasia   [] Vissual changes   [] Weakness or numbness in arm   [] Weakness or numbness in leg Musculoskeletal:    [] Joint swelling   [] Joint pain   [] Low back pain Hematologic:  [] Easy bruising  [] Easy bleeding   [] Hypercoagulable state   [] Anemic Gastrointestinal:  [] Diarrhea   [] Vomiting  [] Gastroesophageal reflux/heartburn   [] Difficulty swallowing. Genitourinary:  [] Chronic kidney disease   [] Difficult urination  [] Frequent urination   [] Blood in urine Skin:  [] Rashes   [] Ulcers  Psychological:  [] History of anxiety   []  History of major depression.  Physical Examination  Vitals:   09/11/23 1533  BP: 116/79  Pulse: 82  Weight: 200 lb (90.7 kg)  Height: 5' 1 (1.549 m)   Body mass index is 37.79 kg/m. Gen: WD/WN, NAD Head: [place]/AT, No temporalis wasting.  Ear/Nose/Throat: Hearing grossly intact, nares w/o erythema or drainage, pinna without lesions Eyes: PER, EOMI, sclera nonicteric.  Neck: Supple, no gross masses.  No JVD.  Pulmonary:  Good air movement, no audible wheezing, no use of accessory muscles.  Cardiac: RRR, precordium not hyperdynamic. Vascular:  scattered varicosities present bilaterally.  Moderate venous stasis changes to the legs bilaterally.  2+ soft pitting edema. CEAP [id number]   Vessel Right Left  Radial Palpable Palpable  Gastrointestinal: soft, non-distended. No guarding/no peritoneal signs.  Musculoskeletal: M/S 5/5 throughout.  No deformity.  Neurologic: CN 2-12 intact. Pain and light touch intact in extremities.  Symmetrical.  Speech is fluent. Motor exam as listed above. Psychiatric: Judgment intact, Mood & affect appropriate for pt's clinical situation. Dermatologic: Venous rashes no ulcers noted.  No changes consistent with cellulitis. Lymph : No lichenification or skin changes of chronic lymphedema.  CBC Lab Results  Component Value Date   WBC 4.8 09/08/2023   HGB 12.8 09/08/2023   HCT 38.4 09/08/2023   MCV 90.6 09/08/2023   PLT 283 09/08/2023    BMET    Component Value Date/Time   NA 139 09/08/2023 0939   NA 140 08/23/2013 1720   K 3.6 09/08/2023  0939   K 3.7 08/23/2013 1720   CL 105 09/08/2023 0939   CL 105 08/23/2013 1720   CO2 23 09/08/2023 0939   CO2 25 08/23/2013 1720   GLUCOSE 46 (L) 09/08/2023 0939   GLUCOSE 86 08/23/2013 1720   BUN 12 09/08/2023 0939   BUN 7  08/23/2013 1720   CREATININE 0.66 09/08/2023 0939   CREATININE 0.77 03/07/2023 1522   CREATININE 0.84 08/23/2013 1720   CALCIUM 8.8 (L) 09/08/2023 0939   CALCIUM 8.6 08/23/2013 1720   GFRNONAA >60 09/08/2023 0939   GFRNONAA >60 03/07/2023 1522   GFRNONAA >60 08/23/2013 1720   GFRAA >60 01/17/2019 0604   GFRAA >60 08/23/2013 1720   Estimated Creatinine Clearance: 88.2 mL/min (by C-G formula based on SCr of 0.66 mg/dL).  COAG Lab Results  Component Value Date   INR 1.0 05/11/2018    Radiology MR PELVIS WO CONTRAST Result Date: 08/23/2023 CLINICAL DATA:  Acute on chronic left hip pain. EXAM: MRI PELVIS WITHOUT CONTRAST TECHNIQUE: Multiplanar multisequence MR imaging of the pelvis was performed. No intravenous contrast was administered. COMPARISON:  CT of the left hip dated 08/22/2023. MRI of the left hip dated 11/19/2022. FINDINGS: Left hip: Severe superior joint space narrowing of the left hip with near full-thickness chondral loss of the central weight-bearing portion resulting in bone-on-bone contact with regions of femoral head subchondral flattening and depression and pronounced T2/STIR hyperintense marrow edema of the left femoral head and neck. Mild marrow edema at the superolateral acetabulum. There is a small left hip joint effusion. 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 T2 hyperintense signal at the base of the anterior superior labrum is concerning for a tear. Bones: The remainder of the visualized bones demonstrate normal marrow signal intensity. Right hip  demonstrates similar mild joint space narrowing with no joint effusion. Sacroiliac joints are anatomically aligned with mild degenerative arthropathy. Pubic symphysis is unremarkable. Soft tissue and Muscle: There is no obvious soft tissue swelling. Muscles demonstrate normal morphology and signal intensity.Hamstring tendon origins are intact.Partial-thickness undersurface tear of the left gluteus minimus cuff insertion is again noted with mild tendinosis. Mild tendinosis of the right gluteus medius cuff insertion with low-grade partial-thickness undersurface tear.Visualized intrapelvic contents are grossly unremarkable IMPRESSION: 1. Severe superior joint space narrowing of the left hip with near full-thickness chondral loss of the central weight-bearing portion resulting in bone-on-bone contact with regions of femoral head subchondral flattening and depression and marrow edema of the left femoral head and neck. Mild marrow edema at the superolateral acetabulum. There is a small left hip joint effusion. 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 2. Tear of the left anterosuperior labrum. 3. Mild similar joint space narrowing of the right hip without joint effusion. 4. Redemonstrated partial-thickness undersurface tear of the left gluteus minimus cuff insertion with mild tendinosis. 5. Mild tendinosis of the right gluteus medius cuff insertion with low-grade partial-thickness undersurface tear. Electronically Signed   By: Harrietta Sherry M.D.   On: 08/23/2023 09:46   MR HIP LEFT WO CONTRAST Result Date: 08/23/2023 CLINICAL DATA:  Acute on chronic left hip pain. EXAM: MRI PELVIS WITHOUT CONTRAST TECHNIQUE: Multiplanar multisequence MR imaging of the pelvis was performed. No intravenous contrast was administered.  COMPARISON:  CT of the left hip dated 08/22/2023. MRI of the left hip dated 11/19/2022. FINDINGS: Left hip: Severe superior joint space narrowing of the left hip with near full-thickness chondral loss of the central weight-bearing portion resulting in bone-on-bone contact with regions of femoral  head subchondral flattening and depression and pronounced T2/STIR hyperintense marrow edema of the left femoral head and neck. Mild marrow edema at the superolateral acetabulum. There is a small left hip joint effusion. 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 T2 hyperintense signal at the base of the anterior superior labrum is concerning for a tear. Bones: The remainder of the visualized bones demonstrate normal marrow signal intensity. Right hip demonstrates similar mild joint space narrowing with no joint effusion. Sacroiliac joints are anatomically aligned with mild degenerative arthropathy. Pubic symphysis is unremarkable. Soft tissue and Muscle: There is no obvious soft tissue swelling. Muscles demonstrate normal morphology and signal intensity.Hamstring tendon origins are intact.Partial-thickness undersurface tear of the left gluteus minimus cuff insertion is again noted with mild tendinosis. Mild tendinosis of the right gluteus medius cuff insertion with low-grade partial-thickness undersurface tear.Visualized intrapelvic contents are grossly unremarkable IMPRESSION: 1. Severe superior joint space narrowing of the left hip with near full-thickness chondral loss of the central weight-bearing portion resulting in bone-on-bone contact with regions of femoral head subchondral flattening and depression and marrow edema of the left femoral head and neck. Mild marrow edema at the superolateral acetabulum. There is a small left  hip joint effusion. 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 2. Tear of the left anterosuperior labrum. 3. Mild similar joint space narrowing of the right hip without joint effusion. 4. Redemonstrated partial-thickness undersurface tear of the left gluteus minimus cuff insertion with mild tendinosis. 5. Mild tendinosis of the right gluteus medius cuff insertion with low-grade partial-thickness undersurface tear. Electronically Signed   By: Harrietta Sherry M.D.   On: 08/23/2023 09:46   CT Hip Left Wo Contrast Result Date: 08/22/2023 CLINICAL DATA:  Acute on chronic left hip pain. EXAM: CT OF THE LEFT HIP WITHOUT CONTRAST TECHNIQUE: Multidetector CT imaging of the left hip was performed according to the standard protocol. Multiplanar CT image reconstructions were also generated. RADIATION DOSE REDUCTION: This exam was performed according to the departmental dose-optimization program which includes automated exposure control, adjustment of the mA and/or kV according to patient size and/or use of iterative reconstruction technique. COMPARISON:  Left hip radiographs dated 08/22/2023. MRI of the left hip dated 11/19/2022. FINDINGS: Bones/Joint/Cartilage No acute fracture or dislocation. The left femoral head is seated within the acetabulum. Severe superior joint space narrowing of the left hip with associated acetabular and humeral head osteophytosis, and subchondral sclerosis. Left greater trochanteric enthesopathy. Visualized left sacroiliac joint and pubic symphysis are anatomically aligned. No sizable joint effusion. Ligaments Ligaments are suboptimally evaluated by CT. Muscles and Tendons Muscles are normal. No muscle atrophy. No intramuscular fluid collection or hematoma. Soft tissue No fluid collection or hematoma.  No soft tissue mass. Partially visualized IUD in place. IMPRESSION: 1. No acute osseous abnormality. 2. Severe superior joint space narrowing of the left hip with osteophytosis and subchondral sclerosis. Electronically Signed   By: Harrietta Sherry M.D.   On: 08/22/2023 14:40   DG Hip Unilat W or Wo Pelvis 2-3 Views Left Result Date: 08/22/2023 EXAM: 2 or 3 VIEW(S) XRAY OF THE LEFT HIP 08/22/2023 11:25:00 AM COMPARISON: 12/16/2019, number [phone] CLINICAL HISTORY: Pain unable to bear weight.  Patient complains of left hip pain; unable to bear weight. FINDINGS: BONES AND JOINTS: Worsening joint space loss of the left hip with near bone-on-bone articulation. Mild joint space loss of the right hip. Degenerative disc disease of the visualized lumbar spine. SOFT TISSUES: Intruterine device overlying the midline pelvis. Multiple pelvic phleboliths. IMPRESSION: 1. Progressive joint space loss of the left hip with near bone-on-bone articulation, consistent with worsening osteoarthritis. Electronically signed by: Rogelia Myers MD 08/22/2023 12:34 PM EDT RP Workstation: [id number]     Assessment/Plan 1. Chronic pulmonary embolism, unspecified pulmonary embolism type, unspecified whether acute cor pulmonale present (HCC) (Primary) The patient will continue anticoagulation for now as there have not been any problems or complications at this point.  IVC filter is strongly indicated prior to high risk orthopedic surgery.  Especially given the history of PE / DVT.  IVC filter placement will be done the week for surgery. Risk and benefits were reviewed the patient.  Indications for the procedure were reviewed.  All questions were answered, the patient agrees to proceed.   Elevation was stressed, such as the use of a recliner.  I have reviewed with the patient DVT and post phlebitic changes such as swelling and why it  causes symptoms such as pain.  I recommended to the patient to wear graduated compression stockings,  beginning after three full days of anticoagulation.  Graduated compression should be worn on a daily basis. The patient should wear compression beginning first thing in the morning and removing them in the evening. The patient is instructed specifically not to sleep in the stockings.  In addition, behavioral modification including elevation during the day and avoidance of prolonged dependency will be initiated.    The patient will follow-up with me 2-3 months after the joint replacement surgery to discuss removal (this was also discussed today and the patient agrees with the plan to have the filter removed).   2. Chronic venous insufficiency The patient will continue anticoagulation for now as there have not been any problems or complications at this point.  IVC filter is strongly indicated prior to high risk orthopedic surgery.  Especially given the history of PE / DVT.  IVC filter placement will be done the week for surgery. Risk and benefits were reviewed the patient.  Indications for the procedure were reviewed.  All questions were answered, the patient agrees to proceed.   Elevation was stressed, such as the use of a recliner.  I have reviewed with the patient DVT and post phlebitic changes such as swelling and why it  causes symptoms such as pain.  I recommended to the patient to wear graduated compression stockings, beginning after three full days of anticoagulation.  Graduated compression should be worn on a daily basis. The patient should wear compression beginning first thing in the morning and removing them in the evening. The patient is instructed specifically not to sleep in the stockings.  In addition, behavioral modification including elevation during the day and avoidance of prolonged dependency will be initiated.    The patient will follow-up with me 2-3 months after the joint replacement surgery to discuss removal (this was also discussed today and the patient agrees with the plan to  have the filter removed).   3. Primary osteoarthritis of left hip Continue medications to treat the patient's degenerative disease as already ordered, these medications have been reviewed and there are no changes at this time.  She is scheduled for her left hip surgery this Thursday, September 4.  IVC filter will be placed Tuesday, September 2  Continued activity and therapy was stressed.  4. Lymphedema Recommend:  No surgery or intervention at this point in time.    I have reviewed my discussion with the patient regarding venous insufficiency and secondary lymph edema and why it  causes symptoms. I have discussed with the patient the chronic skin changes that accompany these problems and the long term sequela such as ulceration and infection.  Patient will continue wearing graduated compression on a daily basis a prescription, if needed, was given to the patient to keep this updated. The patient will  put the compression on first thing in the morning and removing them in the evening. The patient is instructed specifically not to sleep in the compression.  In addition, behavioral modification including elevation during the day will be continued.  Diet and salt restriction will also be helpful.  Previous duplex ultrasound of the lower extremities shows normal deep venous system, superficial reflux was not present.   Following the review of the ultrasound the patient will follow up in 12 months to reassess the degree of swelling and the control that graduated compression is offering.   The patient can be assessed for a Lymph Pump at that time.  However, at this time the patient states they are satisfied with the control compression and elevation is yielding.      Cordella Shawl, MD  09/11/2023 3:41 PM

## 2023-09-12 ENCOUNTER — Ambulatory Visit (INDEPENDENT_AMBULATORY_CARE_PROVIDER_SITE_OTHER): Payer: Self-pay | Admitting: Vascular Surgery

## 2023-09-12 ENCOUNTER — Telehealth (INDEPENDENT_AMBULATORY_CARE_PROVIDER_SITE_OTHER): Payer: Self-pay

## 2023-09-12 NOTE — Telephone Encounter (Signed)
 Spoke with the patient and she is scheduled with Dr. Jama for a IVC filter placement on 09/19/23 with a 2:00 pm arrival time to the [facility]. Pre-procedure instructions were discussed and will be sent to Mychart and mailed.

## 2023-09-19 ENCOUNTER — Other Ambulatory Visit: Payer: Self-pay

## 2023-09-19 ENCOUNTER — Encounter (INDEPENDENT_AMBULATORY_CARE_PROVIDER_SITE_OTHER): Payer: Self-pay | Admitting: Vascular Surgery

## 2023-09-19 ENCOUNTER — Ambulatory Visit
Admission: RE | Admit: 2023-09-19 | Discharge: 2023-09-19 | Disposition: A | Discharge status: Home / Self Care | Attending: Vascular Surgery | Admitting: Vascular Surgery

## 2023-09-19 ENCOUNTER — Encounter
Admission: RE | Disposition: A | Discharge status: Home / Self Care | Payer: Self-pay | Source: Home / Self Care | Attending: Vascular Surgery

## 2023-09-19 DIAGNOSIS — I890 Lymphedema, not elsewhere classified: Secondary | ICD-10-CM | POA: Insufficient documentation

## 2023-09-19 DIAGNOSIS — M1612 Unilateral primary osteoarthritis, left hip: Secondary | ICD-10-CM | POA: Diagnosis not present

## 2023-09-19 DIAGNOSIS — I872 Venous insufficiency (chronic) (peripheral): Secondary | ICD-10-CM | POA: Diagnosis not present

## 2023-09-19 DIAGNOSIS — Z86711 Personal history of pulmonary embolism: Secondary | ICD-10-CM | POA: Diagnosis not present

## 2023-09-19 DIAGNOSIS — Z86718 Personal history of other venous thrombosis and embolism: Secondary | ICD-10-CM | POA: Insufficient documentation

## 2023-09-19 DIAGNOSIS — D6859 Other primary thrombophilia: Secondary | ICD-10-CM

## 2023-09-19 DIAGNOSIS — I2699 Other pulmonary embolism without acute cor pulmonale: Secondary | ICD-10-CM | POA: Insufficient documentation

## 2023-09-19 HISTORY — PX: IVC FILTER INSERTION: CATH118245

## 2023-09-19 SURGERY — IVC FILTER INSERTION
Anesthesia: Moderate Sedation

## 2023-09-19 MED ORDER — MIDAZOLAM HCL 5 MG/5ML IJ SOLN
INTRAMUSCULAR | Status: AC
  Filled 2023-09-19: qty 5

## 2023-09-19 MED ORDER — HEPARIN (PORCINE) IN NACL 1000-0.9 UT/500ML-% IV SOLN
INTRAVENOUS | Status: DC | PRN
  Administered 2023-09-19: 500 mL

## 2023-09-19 MED ORDER — FAMOTIDINE 20 MG PO TABS
40.0000 mg | ORAL_TABLET | Freq: Once | ORAL | Status: AC | PRN
  Administered 2023-09-19: 40 mg via ORAL

## 2023-09-19 MED ORDER — DIPHENHYDRAMINE HCL 50 MG/ML IJ SOLN
INTRAMUSCULAR | Status: AC
  Filled 2023-09-19: qty 1

## 2023-09-19 MED ORDER — METHYLPREDNISOLONE SODIUM SUCC 125 MG IJ SOLR
125.0000 mg | Freq: Once | INTRAMUSCULAR | Status: AC | PRN
  Administered 2023-09-19: 125 mg via INTRAVENOUS

## 2023-09-19 MED ORDER — FAMOTIDINE 20 MG PO TABS
ORAL_TABLET | ORAL | Status: AC
  Filled 2023-09-19: qty 2

## 2023-09-19 MED ORDER — IODIXANOL 320 MG/ML IV SOLN
INTRAVENOUS | Status: DC | PRN
  Administered 2023-09-19: 45 mL

## 2023-09-19 MED ORDER — ONDANSETRON HCL 4 MG/2ML IJ SOLN: 4.0000 mg | Freq: Four times a day (QID) | INTRAMUSCULAR | Status: DC | PRN

## 2023-09-19 MED ORDER — METHYLPREDNISOLONE SODIUM SUCC 125 MG IJ SOLR
INTRAMUSCULAR | Status: AC
  Filled 2023-09-19: qty 2

## 2023-09-19 MED ORDER — LIDOCAINE HCL (PF) 1 % IJ SOLN
INTRAMUSCULAR | Status: DC | PRN
  Administered 2023-09-19: 10 mL

## 2023-09-19 MED ORDER — MIDAZOLAM HCL 2 MG/2ML IJ SOLN
INTRAMUSCULAR | Status: DC | PRN
  Administered 2023-09-19: 2 mg via INTRAVENOUS

## 2023-09-19 MED ORDER — MIDAZOLAM HCL 2 MG/ML PO SYRP: 8.0000 mg | ORAL_SOLUTION | Freq: Once | ORAL | Status: DC | PRN

## 2023-09-19 MED ORDER — DIPHENHYDRAMINE HCL 50 MG/ML IJ SOLN
50.0000 mg | Freq: Once | INTRAMUSCULAR | Status: AC | PRN
  Administered 2023-09-19: 50 mg via INTRAVENOUS

## 2023-09-19 MED ORDER — FENTANYL CITRATE (PF) 100 MCG/2ML IJ SOLN
INTRAMUSCULAR | Status: AC
  Filled 2023-09-19: qty 2

## 2023-09-19 MED ORDER — FENTANYL CITRATE (PF) 100 MCG/2ML IJ SOLN
INTRAMUSCULAR | Status: DC | PRN
Start: 2023-09-19 — End: 2023-09-19
  Administered 2023-09-19: 50 ug via INTRAVENOUS

## 2023-09-19 MED ORDER — SODIUM CHLORIDE 0.9 % IV SOLN: INTRAVENOUS | Status: DC

## 2023-09-19 MED ORDER — HYDROMORPHONE HCL 1 MG/ML IJ SOLN: 1.0000 mg | Freq: Once | INTRAMUSCULAR | Status: DC | PRN

## 2023-09-19 MED ORDER — VANCOMYCIN HCL 1000 MG/200ML IV SOLN
1000.0000 mg | INTRAVENOUS | Status: AC
  Administered 2023-09-19: 1000 mg via INTRAVENOUS
  Filled 2023-09-19: qty 200

## 2023-09-19 MED ORDER — HEPARIN SODIUM (PORCINE) 1000 UNIT/ML IJ SOLN
INTRAMUSCULAR | Status: AC
  Filled 2023-09-19: qty 10

## 2023-09-19 SURGICAL SUPPLY — 7 items
COVER PROBE ULTRASOUND 5X96 (MISCELLANEOUS) IMPLANT
KIT FEMORAL DEL DENALI (Miscellaneous) IMPLANT
NDL ENTRY 21GA 7CM ECHOTIP (NEEDLE) IMPLANT
NEEDLE ENTRY 21GA 7CM ECHOTIP (NEEDLE) ×1 IMPLANT
PACK ANGIOGRAPHY (CUSTOM PROCEDURE TRAY) ×1 IMPLANT
SET INTRO CAPELLA COAXIAL (SET/KITS/TRAYS/PACK) IMPLANT
WIRE J 3MM .035X145CM (WIRE) IMPLANT

## 2023-09-19 NOTE — Op Note (Signed)
 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  POST-OPERATIVE DIAGNOSIS: Same  PROCEDURE: 1.   Ultrasound guidance for vascular access to the common femoral vein 2.   Catheter placement into the inferior vena cava 3.   Inferior venacavogram 4.   Placement of a Denali IVC filter  SURGEON: Cordella Shawl  ASSISTANT(S): None  ANESTHESIA: Conscious sedation was administered by the interventional radiology RN under my direct supervision. IV Versed  plus fentanyl  were utilized. Continuous ECG, pulse oximetry and blood pressure was monitored throughout the entire procedure. Conscious sedation was for a total of 15 minutes.  ESTIMATED BLOOD LOSS: minimal  FINDING(S): 1.  Patent IVC  SPECIMEN(S):  none  INDICATIONS:   Debbie Sosa is a 48 y.o. y.o. female who presents with upcoming joint replacement surgery.  She has a history of PE and known hypercoagulable syndrome.  Inferior vena cava filter is indicated for this reason.  Risks and benefits including filter thrombosis, migration, fracture, bleeding, and infection were all discussed.  We discussed that all IVC filters that we place can be removed if desired from the patient once the need for the filter has passed.    DESCRIPTION: After obtaining full informed written consent, the patient was brought back to the vascular suite. The skin was sterilely prepped and draped in a sterile surgical field was created. Ultrasound was placed in a sterile sleeve. The right common femoral vein was echolucent and compressible indicating patency. Image was recorded for the permanent record. The puncture was made under continuous real-time ultrasound guidance.  The right common femoral vein was accessed under direct ultrasound guidance without difficulty with a micropuncture needle.  Microwire was then advanced under fluoroscopic guidance without difficulty. Micro-sheath was then inserted and a J-wire was then placed. The dilator is passed over the wire and the delivery sheath was placed into the inferior vena cava.  Inferior venacavogram was performed. This demonstrated a patent IVC with the level of the renal veins at L2.  The filter was then deployed into the inferior vena cava at the level of L3 just below the renal veins. The delivery sheath was then removed. Pressure was held. Sterile dressings were placed. The patient tolerated the procedure well and was taken to the recovery room in stable condition.  Interpretation: IVC is widely patent.  It measures approximately 20 mm in diameter.  Denali filter is deployed just below the level of the renal veins and good orientation.  COMPLICATIONS: None  CONDITION: Stable  Cordella Shawl  09/19/2023, 4:10 PM

## 2023-09-19 NOTE — Interval H&P Note (Signed)
 History and Physical Interval Note:  09/19/2023 4:07 PM  Debbie Sosa  has presented today for surgery, with the diagnosis of IVC filter placement   PE.  The various methods of treatment have been discussed with the patient and family. After consideration of risks, benefits and other options for treatment, the patient has consented to  Procedure(s): IVC FILTER INSERTION (N/A) as a surgical intervention.  The patient's history has been reviewed, patient examined, no change in status, stable for surgery.  I have reviewed the patient's chart and labs.  Questions were answered to the patient's satisfaction.     Cordella Shawl

## 2023-09-20 ENCOUNTER — Encounter: Payer: Self-pay | Admitting: Vascular Surgery

## 2023-09-21 ENCOUNTER — Ambulatory Visit

## 2023-09-21 ENCOUNTER — Ambulatory Visit
Admission: RE | Admit: 2023-09-21 | Discharge: 2023-09-22 | Disposition: A | Discharge status: Home Health | Attending: Orthopedic Surgery | Admitting: Orthopedic Surgery

## 2023-09-21 ENCOUNTER — Other Ambulatory Visit: Payer: Self-pay

## 2023-09-21 ENCOUNTER — Encounter: Payer: Self-pay | Admitting: Orthopedic Surgery

## 2023-09-21 ENCOUNTER — Encounter
Admission: RE | Disposition: A | Discharge status: Home Health | Payer: Self-pay | Source: Home / Self Care | Attending: Orthopedic Surgery

## 2023-09-21 DIAGNOSIS — X58XXXA Exposure to other specified factors, initial encounter: Secondary | ICD-10-CM | POA: Insufficient documentation

## 2023-09-21 DIAGNOSIS — R760 Raised antibody titer: Secondary | ICD-10-CM | POA: Diagnosis not present

## 2023-09-21 DIAGNOSIS — Z96642 Presence of left artificial hip joint: Secondary | ICD-10-CM | POA: Diagnosis present

## 2023-09-21 DIAGNOSIS — Z86711 Personal history of pulmonary embolism: Secondary | ICD-10-CM | POA: Insufficient documentation

## 2023-09-21 DIAGNOSIS — D649 Anemia, unspecified: Secondary | ICD-10-CM | POA: Diagnosis not present

## 2023-09-21 DIAGNOSIS — J45909 Unspecified asthma, uncomplicated: Secondary | ICD-10-CM | POA: Diagnosis not present

## 2023-09-21 DIAGNOSIS — M1612 Unilateral primary osteoarthritis, left hip: Secondary | ICD-10-CM | POA: Diagnosis present

## 2023-09-21 DIAGNOSIS — K219 Gastro-esophageal reflux disease without esophagitis: Secondary | ICD-10-CM | POA: Diagnosis not present

## 2023-09-21 DIAGNOSIS — S76012A Strain of muscle, fascia and tendon of left hip, initial encounter: Secondary | ICD-10-CM | POA: Diagnosis not present

## 2023-09-21 DIAGNOSIS — Z01812 Encounter for preprocedural laboratory examination: Secondary | ICD-10-CM

## 2023-09-21 HISTORY — PX: TOTAL HIP ARTHROPLASTY: SHX124

## 2023-09-21 LAB — POCT PREGNANCY, URINE: Preg Test, Ur: NEGATIVE

## 2023-09-21 SURGERY — ARTHROPLASTY, HIP, TOTAL, ANTERIOR APPROACH
Anesthesia: Spinal | Site: Hip | Laterality: Left

## 2023-09-21 MED ORDER — ENOXAPARIN SODIUM 40 MG/0.4ML IJ SOSY
40.0000 mg | PREFILLED_SYRINGE | INTRAMUSCULAR | Status: DC
  Administered 2023-09-22: 40 mg via SUBCUTANEOUS
  Filled 2023-09-21: qty 0.4

## 2023-09-21 MED ORDER — TRANEXAMIC ACID-NACL 1000-0.7 MG/100ML-% IV SOLN
INTRAVENOUS | Status: AC
  Filled 2023-09-21: qty 100

## 2023-09-21 MED ORDER — PHENYLEPHRINE HCL-NACL 20-0.9 MG/250ML-% IV SOLN
INTRAVENOUS | Status: AC
  Filled 2023-09-21: qty 250

## 2023-09-21 MED ORDER — VANCOMYCIN HCL IN DEXTROSE 1-5 GM/200ML-% IV SOLN
1000.0000 mg | Freq: Two times a day (BID) | INTRAVENOUS | Status: AC
  Administered 2023-09-21: 1000 mg via INTRAVENOUS
  Filled 2023-09-21 (×2): qty 200

## 2023-09-21 MED ORDER — IRRISEPT - 450ML BOTTLE WITH 0.05% CHG IN STERILE WATER, USP 99.95% OPTIME
TOPICAL | Status: DC | PRN
  Administered 2023-09-21: 450 mL

## 2023-09-21 MED ORDER — PHENOL 1.4 % MT LIQD: 1.0000 | OROMUCOSAL | Status: DC | PRN

## 2023-09-21 MED ORDER — METOCLOPRAMIDE HCL 5 MG PO TABS: 5.0000 mg | ORAL_TABLET | Freq: Three times a day (TID) | ORAL | Status: DC | PRN

## 2023-09-21 MED ORDER — MIDAZOLAM HCL 2 MG/2ML IJ SOLN
INTRAMUSCULAR | Status: AC
Start: 2023-09-21 — End: 2023-09-21
  Filled 2023-09-21: qty 2

## 2023-09-21 MED ORDER — OXYCODONE HCL 5 MG/5ML PO SOLN: 5.0000 mg | Freq: Once | ORAL | Status: AC | PRN

## 2023-09-21 MED ORDER — OXYCODONE HCL 5 MG PO TABS: 5.0000 mg | ORAL_TABLET | ORAL | 0 refills | Status: DC | PRN

## 2023-09-21 MED ORDER — DIPHENHYDRAMINE HCL 12.5 MG/5ML PO ELIX
12.5000 mg | ORAL_SOLUTION | ORAL | Status: DC | PRN
  Administered 2023-09-21 – 2023-09-22 (×4): 12.5 mg via ORAL
  Filled 2023-09-21 (×4): qty 5

## 2023-09-21 MED ORDER — DEXAMETHASONE SODIUM PHOSPHATE 10 MG/ML IJ SOLN
8.0000 mg | Freq: Once | INTRAMUSCULAR | Status: AC
Start: 2023-09-21 — End: 2023-09-21
  Administered 2023-09-21: 8 mg via INTRAVENOUS

## 2023-09-21 MED ORDER — FENTANYL CITRATE (PF) 100 MCG/2ML IJ SOLN
25.0000 ug | INTRAMUSCULAR | Status: DC | PRN
  Administered 2023-09-21 (×4): 25 ug via INTRAVENOUS

## 2023-09-21 MED ORDER — ONDANSETRON HCL 4 MG PO TABS
4.0000 mg | ORAL_TABLET | Freq: Four times a day (QID) | ORAL | 0 refills | Status: DC | PRN
Start: 2023-09-21 — End: 2023-11-28

## 2023-09-21 MED ORDER — CHLORHEXIDINE GLUCONATE 0.12 % MT SOLN
OROMUCOSAL | Status: AC
  Filled 2023-09-21: qty 15

## 2023-09-21 MED ORDER — DEXAMETHASONE SODIUM PHOSPHATE 10 MG/ML IJ SOLN
INTRAMUSCULAR | Status: AC
  Filled 2023-09-21: qty 1

## 2023-09-21 MED ORDER — PROPOFOL 10 MG/ML IV BOLUS
INTRAVENOUS | Status: AC
  Filled 2023-09-21: qty 20

## 2023-09-21 MED ORDER — ONDANSETRON HCL 4 MG/2ML IJ SOLN
INTRAMUSCULAR | Status: AC
  Filled 2023-09-21: qty 2

## 2023-09-21 MED ORDER — BUPIVACAINE HCL (PF) 0.5 % IJ SOLN
INTRAMUSCULAR | Status: DC | PRN
  Administered 2023-09-21: 2.6 mL

## 2023-09-21 MED ORDER — SODIUM CHLORIDE (PF) 0.9 % IJ SOLN
INTRAMUSCULAR | Status: AC
  Filled 2023-09-21: qty 10

## 2023-09-21 MED ORDER — FENTANYL CITRATE (PF) 100 MCG/2ML IJ SOLN
INTRAMUSCULAR | Status: AC
Start: 2023-09-21 — End: 2023-09-21
  Filled 2023-09-21: qty 2

## 2023-09-21 MED ORDER — LEVOFLOXACIN IN D5W 500 MG/100ML IV SOLN
500.0000 mg | INTRAVENOUS | Status: AC
  Administered 2023-09-21: 500 mg via INTRAVENOUS

## 2023-09-21 MED ORDER — LEVOCETIRIZINE DIHYDROCHLORIDE 5 MG PO TABS: 5.0000 mg | ORAL_TABLET | ORAL | Status: DC

## 2023-09-21 MED ORDER — MIDAZOLAM HCL 5 MG/5ML IJ SOLN
INTRAMUSCULAR | Status: DC | PRN
  Administered 2023-09-21: 2 mg via INTRAVENOUS

## 2023-09-21 MED ORDER — SODIUM CHLORIDE (PF) 0.9 % IJ SOLN
INTRAMUSCULAR | Status: DC | PRN
  Administered 2023-09-21: 50 mL

## 2023-09-21 MED ORDER — DIPHENHYDRAMINE HCL 50 MG/ML IJ SOLN
INTRAMUSCULAR | Status: DC | PRN
  Administered 2023-09-21: 12.5 mg via INTRAVENOUS

## 2023-09-21 MED ORDER — ENOXAPARIN SODIUM 40 MG/0.4ML IJ SOSY: 40.0000 mg | PREFILLED_SYRINGE | INTRAMUSCULAR | 0 refills | Status: DC

## 2023-09-21 MED ORDER — DOCUSATE SODIUM 100 MG PO CAPS
100.0000 mg | ORAL_CAPSULE | Freq: Two times a day (BID) | ORAL | Status: DC
  Administered 2023-09-21 – 2023-09-22 (×3): 100 mg via ORAL
  Filled 2023-09-21 (×3): qty 1

## 2023-09-21 MED ORDER — KETOROLAC TROMETHAMINE 30 MG/ML IJ SOLN
INTRAMUSCULAR | Status: DC | PRN
  Administered 2023-09-21: 30 mg via INTRAVENOUS

## 2023-09-21 MED ORDER — ONDANSETRON HCL 4 MG/2ML IJ SOLN
INTRAMUSCULAR | Status: DC | PRN
  Administered 2023-09-21: 4 mg via INTRAVENOUS

## 2023-09-21 MED ORDER — OXYCODONE HCL 5 MG PO TABS
10.0000 mg | ORAL_TABLET | ORAL | Status: DC | PRN
  Administered 2023-09-21 – 2023-09-22 (×4): 10 mg via ORAL
  Filled 2023-09-21 (×4): qty 2

## 2023-09-21 MED ORDER — PHENYLEPHRINE 80 MCG/ML (10ML) SYRINGE FOR IV PUSH (FOR BLOOD PRESSURE SUPPORT)
PREFILLED_SYRINGE | INTRAVENOUS | Status: DC | PRN
  Administered 2023-09-21: 160 ug via INTRAVENOUS
  Administered 2023-09-21: 80 ug via INTRAVENOUS

## 2023-09-21 MED ORDER — LACTATED RINGERS IV SOLN: INTRAVENOUS | Status: DC

## 2023-09-21 MED ORDER — TRANEXAMIC ACID-NACL 1000-0.7 MG/100ML-% IV SOLN
INTRAVENOUS | Status: DC | PRN
  Administered 2023-09-21 (×2): 1000 mg via INTRAVENOUS

## 2023-09-21 MED ORDER — SODIUM CHLORIDE 0.9 % IV SOLN: INTRAVENOUS | Status: DC

## 2023-09-21 MED ORDER — BUPIVACAINE LIPOSOME 1.3 % IJ SUSP
INTRAMUSCULAR | Status: AC
  Filled 2023-09-21: qty 20

## 2023-09-21 MED ORDER — PROPOFOL 1000 MG/100ML IV EMUL
INTRAVENOUS | Status: AC
  Filled 2023-09-21: qty 100

## 2023-09-21 MED ORDER — DEXMEDETOMIDINE HCL IN NACL 80 MCG/20ML IV SOLN
INTRAVENOUS | Status: DC | PRN
  Administered 2023-09-21: 4 ug via INTRAVENOUS

## 2023-09-21 MED ORDER — EPHEDRINE 5 MG/ML INJ
INTRAVENOUS | Status: AC
  Filled 2023-09-21: qty 5

## 2023-09-21 MED ORDER — OXYCODONE HCL 5 MG PO TABS
ORAL_TABLET | ORAL | Status: AC
  Filled 2023-09-21: qty 1

## 2023-09-21 MED ORDER — BUPIVACAINE HCL (PF) 0.5 % IJ SOLN
INTRAMUSCULAR | Status: AC
  Filled 2023-09-21: qty 10

## 2023-09-21 MED ORDER — DIPHENHYDRAMINE HCL 50 MG/ML IJ SOLN
INTRAMUSCULAR | Status: AC
  Filled 2023-09-21: qty 1

## 2023-09-21 MED ORDER — LEVOFLOXACIN IN D5W 500 MG/100ML IV SOLN
INTRAVENOUS | Status: AC
  Filled 2023-09-21: qty 100

## 2023-09-21 MED ORDER — VANCOMYCIN HCL 1000 MG/200ML IV SOLN
1000.0000 mg | INTRAVENOUS | Status: AC
  Administered 2023-09-21: 1000 mg via INTRAVENOUS
  Filled 2023-09-21: qty 200

## 2023-09-21 MED ORDER — OXYCODONE HCL 5 MG PO TABS
5.0000 mg | ORAL_TABLET | Freq: Once | ORAL | Status: AC | PRN
  Administered 2023-09-21: 5 mg via ORAL

## 2023-09-21 MED ORDER — CHLORHEXIDINE GLUCONATE 0.12 % MT SOLN
15.0000 mL | Freq: Once | OROMUCOSAL | Status: AC
  Administered 2023-09-21: 15 mL via OROMUCOSAL

## 2023-09-21 MED ORDER — LORATADINE 10 MG PO TABS
10.0000 mg | ORAL_TABLET | ORAL | Status: DC
  Administered 2023-09-22: 10 mg via ORAL
  Filled 2023-09-21: qty 1

## 2023-09-21 MED ORDER — GLYCOPYRROLATE 0.2 MG/ML IJ SOLN
INTRAMUSCULAR | Status: AC
  Filled 2023-09-21: qty 1

## 2023-09-21 MED ORDER — ONDANSETRON HCL 4 MG PO TABS: 4.0000 mg | ORAL_TABLET | Freq: Four times a day (QID) | ORAL | Status: DC | PRN

## 2023-09-21 MED ORDER — FENTANYL CITRATE (PF) 100 MCG/2ML IJ SOLN
INTRAMUSCULAR | Status: DC | PRN
  Administered 2023-09-21 (×2): 50 ug via INTRAVENOUS

## 2023-09-21 MED ORDER — TRANEXAMIC ACID-NACL 1000-0.7 MG/100ML-% IV SOLN: 1000.0000 mg | INTRAVENOUS | Status: DC

## 2023-09-21 MED ORDER — BUPIVACAINE-EPINEPHRINE (PF) 0.25% -1:200000 IJ SOLN
INTRAMUSCULAR | Status: AC
Start: 2023-09-21 — End: 2023-09-21
  Filled 2023-09-21: qty 30

## 2023-09-21 MED ORDER — 0.9 % SODIUM CHLORIDE (POUR BTL) OPTIME
TOPICAL | Status: DC | PRN
  Administered 2023-09-21: 500 mL

## 2023-09-21 MED ORDER — LACTATED RINGERS IV SOLN
INTRAVENOUS | Status: DC | PRN
Start: 2023-09-21 — End: 2023-09-21

## 2023-09-21 MED ORDER — ACETAMINOPHEN 500 MG PO TABS
1000.0000 mg | ORAL_TABLET | Freq: Three times a day (TID) | ORAL | Status: DC
  Administered 2023-09-21 – 2023-09-22 (×3): 1000 mg via ORAL
  Filled 2023-09-21 (×3): qty 2

## 2023-09-21 MED ORDER — ORAL CARE MOUTH RINSE: 15.0000 mL | Freq: Once | OROMUCOSAL | Status: AC

## 2023-09-21 MED ORDER — ACETAMINOPHEN 325 MG PO TABS: 325.0000 mg | ORAL_TABLET | Freq: Four times a day (QID) | ORAL | Status: DC | PRN

## 2023-09-21 MED ORDER — KETOROLAC TROMETHAMINE 30 MG/ML IJ SOLN
INTRAMUSCULAR | Status: AC
  Filled 2023-09-21: qty 1

## 2023-09-21 MED ORDER — PROPOFOL 500 MG/50ML IV EMUL
INTRAVENOUS | Status: DC | PRN
  Administered 2023-09-21: 100 ug/kg/min via INTRAVENOUS

## 2023-09-21 MED ORDER — OXYCODONE HCL 5 MG PO TABS
5.0000 mg | ORAL_TABLET | ORAL | Status: DC | PRN
  Administered 2023-09-21 (×2): 5 mg via ORAL
  Filled 2023-09-21: qty 1

## 2023-09-21 MED ORDER — MENTHOL 3 MG MT LOZG: 1.0000 | LOZENGE | OROMUCOSAL | Status: DC | PRN

## 2023-09-21 MED ORDER — SODIUM CHLORIDE 0.9 % IV SOLN
INTRAVENOUS | Status: DC | PRN
  Administered 2023-09-21: 1000 mg via INTRAVENOUS

## 2023-09-21 MED ORDER — DOCUSATE SODIUM 100 MG PO CAPS
100.0000 mg | ORAL_CAPSULE | Freq: Two times a day (BID) | ORAL | 0 refills | Status: DC
Start: 2023-09-21 — End: 2023-11-28

## 2023-09-21 MED ORDER — METHOCARBAMOL 500 MG PO TABS
500.0000 mg | ORAL_TABLET | Freq: Four times a day (QID) | ORAL | Status: DC | PRN
  Administered 2023-09-21 – 2023-09-22 (×3): 500 mg via ORAL
  Filled 2023-09-21 (×3): qty 1

## 2023-09-21 MED ORDER — EPHEDRINE SULFATE-NACL 50-0.9 MG/10ML-% IV SOSY
PREFILLED_SYRINGE | INTRAVENOUS | Status: DC | PRN
  Administered 2023-09-21 (×2): 10 mg via INTRAVENOUS

## 2023-09-21 MED ORDER — LIDOCAINE HCL URETHRAL/MUCOSAL 2 % EX GEL
CUTANEOUS | Status: AC
  Filled 2023-09-21: qty 6

## 2023-09-21 MED ORDER — METOCLOPRAMIDE HCL 5 MG/ML IJ SOLN: 5.0000 mg | Freq: Three times a day (TID) | INTRAMUSCULAR | Status: DC | PRN

## 2023-09-21 MED ORDER — PHENYLEPHRINE HCL-NACL 20-0.9 MG/250ML-% IV SOLN
INTRAVENOUS | Status: DC | PRN
  Administered 2023-09-21: 40 ug/min via INTRAVENOUS

## 2023-09-21 MED ORDER — DEXMEDETOMIDINE HCL IN NACL 80 MCG/20ML IV SOLN
INTRAVENOUS | Status: AC
  Filled 2023-09-21: qty 20

## 2023-09-21 MED ORDER — PANTOPRAZOLE SODIUM 40 MG PO TBEC
40.0000 mg | DELAYED_RELEASE_TABLET | Freq: Every day | ORAL | Status: DC
  Administered 2023-09-21 – 2023-09-22 (×2): 40 mg via ORAL
  Filled 2023-09-21 (×2): qty 1

## 2023-09-21 MED ORDER — ONDANSETRON HCL 4 MG/2ML IJ SOLN: 4.0000 mg | Freq: Four times a day (QID) | INTRAMUSCULAR | Status: DC | PRN

## 2023-09-21 SURGICAL SUPPLY — 59 items
BLADE CLIPPER SURG (BLADE) IMPLANT
BLADE SAGITTAL AGGR TOOTH XLG (BLADE) ×1 IMPLANT
BNDG COHESIVE 6X5 TAN ST LF (GAUZE/BANDAGES/DRESSINGS) ×2 IMPLANT
BRUSH SCRUB EZ PLAIN DRY (MISCELLANEOUS) ×1 IMPLANT
CHLORAPREP W/TINT 26 (MISCELLANEOUS) ×1 IMPLANT
DERMABOND ADVANCED .7 DNX12 (GAUZE/BANDAGES/DRESSINGS) ×1 IMPLANT
DRAPE C-ARM XRAY 36X54 (DRAPES) ×1 IMPLANT
DRAPE SHEET LG 3/4 BI-LAMINATE (DRAPES) ×2 IMPLANT
DRAPE TABLE BACK 80X90 (DRAPES) ×1 IMPLANT
DRSG MEPILEX SACRM 8.7X9.8 (GAUZE/BANDAGES/DRESSINGS) ×1 IMPLANT
DRSG OPSITE POSTOP 4X8 (GAUZE/BANDAGES/DRESSINGS) ×1 IMPLANT
ELECTRODE BLDE 4.0 EZ CLN MEGD (MISCELLANEOUS) ×1 IMPLANT
ELECTRODE REM PT RTRN 9FT ADLT (ELECTROSURGICAL) ×1 IMPLANT
GAUZE XEROFORM 1X8 LF (GAUZE/BANDAGES/DRESSINGS) IMPLANT
GLOVE BIO SURGEON STRL SZ8 (GLOVE) ×1 IMPLANT
GLOVE BIOGEL PI IND STRL 8 (GLOVE) ×1 IMPLANT
GLOVE PI ORTHO PRO STRL 7.5 (GLOVE) ×2 IMPLANT
GLOVE PI ORTHO PRO STRL SZ8 (GLOVE) ×2 IMPLANT
GLOVE SURG SYN 7.5 PF PI (GLOVE) ×1 IMPLANT
GOWN SRG XL LVL 3 NONREINFORCE (GOWNS) ×1 IMPLANT
GOWN STRL REUS W/ TWL LRG LVL3 (GOWN DISPOSABLE) ×1 IMPLANT
GOWN STRL REUS W/ TWL XL LVL3 (GOWN DISPOSABLE) ×1 IMPLANT
HEAD FEM +0XOFST TPR 32X (Joint) IMPLANT
HOOD PEEL AWAY T7 (MISCELLANEOUS) ×2 IMPLANT
INSERT ANG TRIDENT C 32 0D (Insert) IMPLANT
IV NS 100ML SINGLE PACK (IV SOLUTION) ×1 IMPLANT
KIT PATIENT CARE HANA TABLE (KITS) ×1 IMPLANT
KIT TURNOVER CYSTO (KITS) ×1 IMPLANT
LAVAGE JET IRRISEPT WOUND (IRRIGATION / IRRIGATOR) IMPLANT
LIGHT WAVEGUIDE WIDE FLAT (MISCELLANEOUS) ×1 IMPLANT
MANIFOLD NEPTUNE II (INSTRUMENTS) ×1 IMPLANT
MARKER SKIN DUAL TIP RULER LAB (MISCELLANEOUS) ×1 IMPLANT
MAT ABSORB FLUID 56X50 GRAY (MISCELLANEOUS) ×1 IMPLANT
NDL SPNL 20GX3.5 QUINCKE YW (NEEDLE) ×1 IMPLANT
NEEDLE SPNL 20GX3.5 QUINCKE YW (NEEDLE) ×1 IMPLANT
NS IRRIG 500ML POUR BTL (IV SOLUTION) ×1 IMPLANT
PACK HIP COMPR (MISCELLANEOUS) ×1 IMPLANT
PAD ARMBOARD POSITIONER FOAM (MISCELLANEOUS) ×1 IMPLANT
PENCIL SMOKE EVACUATOR (MISCELLANEOUS) ×1 IMPLANT
SCREW HEX LP 6.5X20 (Screw) IMPLANT
SCREW HEX LP 6.5X25 (Screw) IMPLANT
SHELL ACETAB TRI HIP C 46 (Shell) IMPLANT
SLEEVE SCD COMPRESS KNEE MED (STOCKING) ×1 IMPLANT
SOLUTION IRRIG SURGIPHOR (IV SOLUTION) ×1 IMPLANT
STEM FEM INSIG HIP 1 96 33.5 (Stem) IMPLANT
SURGIFLO W/THROMBIN 8M KIT (HEMOSTASIS) IMPLANT
SUT BONE WAX W31G (SUTURE) ×1 IMPLANT
SUT ETHIBOND 2 V 37 (SUTURE) ×1 IMPLANT
SUT SILK 0 30XBRD TIE 6 (SUTURE) ×1 IMPLANT
SUT STRATAFIX 14 PDO 36 VLT (SUTURE) ×1 IMPLANT
SUT VIC AB 0 CT1 36 (SUTURE) ×1 IMPLANT
SUT VIC AB 2-0 CT2 27 (SUTURE) ×1 IMPLANT
SUTURE STRATA SPIR 4-0 18 (SUTURE) ×1 IMPLANT
SYR 20ML LL LF (SYRINGE) ×2 IMPLANT
TAPE MICROFOAM 4IN (TAPE) IMPLANT
TOWEL OR 17X26 4PK STRL BLUE (TOWEL DISPOSABLE) IMPLANT
TRAP FLUID SMOKE EVACUATOR (MISCELLANEOUS) ×1 IMPLANT
WAND WEREWOLF FASTSEAL 6.0 (MISCELLANEOUS) ×1 IMPLANT
WATER STERILE IRR 1000ML POUR (IV SOLUTION) ×1 IMPLANT

## 2023-09-21 NOTE — Anesthesia Preprocedure Evaluation (Addendum)
 Anesthesia Evaluation  Patient identified by MRN, date of birth, ID band Patient awake    Reviewed: Allergy & Precautions, NPO status , Patient's Chart, lab work & pertinent test results  History of Anesthesia Complications Negative for: history of anesthetic complications  Airway Mallampati: III  TM Distance: >3 FB Neck ROM: full    Dental no notable dental hx.    Pulmonary asthma    Pulmonary exam normal        Cardiovascular negative cardio ROS Normal cardiovascular exam     Neuro/Psych  Headaches  Neuromuscular disease  negative psych ROS   GI/Hepatic Neg liver ROS,GERD  ,,  Endo/Other  negative endocrine ROS    Renal/GU      Musculoskeletal  (+) Arthritis ,    Abdominal   Peds  Hematology  (+) Blood dyscrasia, anemia Antiphospholipid antibody positive, Hx of PE s/p IVC filter    Anesthesia Other Findings Patient has multiple allergies to opiods and toradol . She says she can take them if she receives benadryl   Past Medical History: No date: Anemia No date: Antiphospholipid antibody positive No date: Arthritis No date: Asthma     Comment:  seasonal No date: Chronic venous insufficiency 2020: COVID-19 No date: DDD (degenerative disc disease), lumbar No date: GERD (gastroesophageal reflux disease) No date: Headache     Comment:  h/o migraines No date: History of hiatal hernia     Comment:  repaired with Gastric bypass No date: History of kidney stones No date: Iron  deficiency     Comment:  recieves iron  infusions every 3 months at the cancer               center No date: Lymphedema     Comment:  bil legs-sees [place] V&V 10/14/2015: Nausea and vomiting No date: Obesity No date: Pneumonia No date: Pulmonary embolism (HCC)     Comment:  after 2nd c-section No date: Sleep apnea     Comment:  had gastric bypass and does not need to use cpap since               surgery No date: Vitamin B12  deficiency  Past Surgical History: 2006: BREAST BIOPSY; Left     Comment:  benign/clip 2015: BREAST BIOPSY; Right     Comment:  benign/clip No date: CESAREAN SECTION No date: CESAREAN SECTION No date: CHOLECYSTECTOMY No date: COLONOSCOPY 10/31/2018: COLONOSCOPY WITH PROPOFOL ; N/A     Comment:  Procedure: COLONOSCOPY WITH PROPOFOL ;  Surgeon: Therisa Bi, MD;  Location: [facility] ENDOSCOPY;  Service:               Gastroenterology;  Laterality: N/A; 10/04/2016: ESOPHAGOGASTRODUODENOSCOPY (EGD) WITH PROPOFOL ; N/A     Comment:  Procedure: ESOPHAGOGASTRODUODENOSCOPY (EGD) WITH               PROPOFOL ;  Surgeon: Therisa Bi, MD;  Location: [facility]               ENDOSCOPY;  Service: Gastroenterology;  Laterality: N/A; 04/14/2017: ESOPHAGOGASTRODUODENOSCOPY (EGD) WITH PROPOFOL ; N/A     Comment:  Procedure: ESOPHAGOGASTRODUODENOSCOPY (EGD) WITH               PROPOFOL ;  Surgeon: Therisa Bi, MD;  Location: [facility]               ENDOSCOPY;  Service: Gastroenterology;  Laterality: N/A; 10/31/2018: ESOPHAGOGASTRODUODENOSCOPY (EGD) WITH PROPOFOL ; N/A     Comment:  Procedure: ESOPHAGOGASTRODUODENOSCOPY (  EGD) WITH               PROPOFOL ;  Surgeon: Therisa Bi, MD;  Location: [facility]               ENDOSCOPY;  Service: Gastroenterology;  Laterality: N/A; 06/28/2019: ESOPHAGOGASTRODUODENOSCOPY (EGD) WITH PROPOFOL ; N/A     Comment:  Procedure: ESOPHAGOGASTRODUODENOSCOPY (EGD) WITH               PROPOFOL ;  Surgeon: Therisa Bi, MD;  Location: [facility]               ENDOSCOPY;  Service: Gastroenterology;  Laterality: N/A; 02/10/2021: ESOPHAGOGASTRODUODENOSCOPY (EGD) WITH PROPOFOL ; N/A     Comment:  Procedure: ESOPHAGOGASTRODUODENOSCOPY (EGD) WITH               PROPOFOL ;  Surgeon: Therisa Bi, MD;  Location: [facility]               ENDOSCOPY;  Service: Gastroenterology;  Laterality: N/A; No date: GASTRIC BYPASS     Comment:  01/2015 No date: HERNIA REPAIR     Comment:  repaired with gastric bypass - Hiatel  hernia 09/17/2021: HYSTEROSCOPY WITH D & C; N/A     Comment:  Procedure: DILATATION AND CURETTAGE /HYSTEROSCOPY;                Surgeon: Leonce Garnette BIRCH, MD;  Location: [facility] ORS;                Service: Gynecology;  Laterality: N/A; 09/17/2021: INTRAUTERINE DEVICE (IUD) INSERTION; N/A     Comment:  Procedure: INTRAUTERINE DEVICE (IUD) INSERTION - MIRENA ;              Surgeon: Leonce Garnette BIRCH, MD;  Location: [facility] ORS;                Service: Gynecology;  Laterality: N/A; 09/17/2021: IUD REMOVAL; N/A     Comment:  Procedure: INTRAUTERINE DEVICE (IUD) REMOVAL;  Surgeon:               Leonce Garnette BIRCH, MD;  Location: [facility] ORS;  Service:               Gynecology;  Laterality: N/A; 09/19/2023: IVC FILTER INSERTION; N/A     Comment:  Procedure: IVC FILTER INSERTION;  Surgeon: Jama Cordella MATSU, MD;  Location: [facility] INVASIVE CV LAB;  Service:              Cardiovascular;  Laterality: N/A; No date: KNEE SURGERY; Right No date: NASAL SINUS SURGERY No date: PLANTAR FASCIA SURGERY     Comment:  x 3 2005: REDUCTION MAMMAPLASTY; Bilateral  BMI    Body Mass Index: 37.49 kg/m      Reproductive/Obstetrics negative OB ROS                              Anesthesia Physical Anesthesia Plan  ASA: 3  Anesthesia Plan: Spinal   Post-op Pain Management: Toradol  IV (intra-op)* and Ofirmev  IV (intra-op)*   Induction: Intravenous  PONV Risk Score and Plan: 2 and Propofol  infusion and TIVA  Airway Management Planned: Natural Airway and Nasal Cannula  Additional Equipment:   Intra-op Plan:   Post-operative Plan:   Informed Consent: I have reviewed the patients History and Physical, chart, labs and discussed the procedure including the risks, benefits and alternatives for the proposed anesthesia with  the patient or authorized representative who has indicated his/her understanding and acceptance.     Dental Advisory Given  Plan Discussed with:  Anesthesiologist, CRNA and Surgeon  Anesthesia Plan Comments: (Patient reports no bleeding problems and no anticoagulant use.  Plan for spinal with backup GA  Patient consented for risks of anesthesia including but not limited to:  - adverse reactions to medications - damage to eyes, teeth, lips or other oral mucosa - nerve damage due to positioning  - risk of bleeding, infection and or nerve damage from spinal that could lead to paralysis - risk of headache or failed spinal - damage to teeth, lips or other oral mucosa - sore throat or hoarseness - damage to heart, brain, nerves, lungs, other parts of body or loss of life  Patient voiced understanding and assent.)         Anesthesia Quick Evaluation

## 2023-09-21 NOTE — Transfer of Care (Signed)
 Immediate Anesthesia Transfer of Care Note  Patient: Debbie Sosa  Procedure(s) Performed: ARTHROPLASTY, HIP, TOTAL, ANTERIOR APPROACH (Left: Hip)  Patient Location: PACU  Anesthesia Type:MAC and Spinal  Level of Consciousness: awake  Airway & Oxygen Therapy: Patient Spontanous Breathing  Post-op Assessment: Report given to RN and Post -op Vital signs reviewed and stable  Post vital signs: Reviewed and stable  Last Vitals:  Vitals Value Taken Time  BP 102/74 09/21/23 10:04  Temp 36.3 C 09/21/23 10:04  Pulse 91 09/21/23 10:04  Resp 23 09/21/23 10:04  SpO2 100 % 09/21/23 10:04    Last Pain:  Vitals:   09/21/23 1004  TempSrc:   PainSc: 0-No pain         Complications: No notable events documented.

## 2023-09-21 NOTE — Discharge Instructions (Signed)
 Instructions after Anterior Total Hip Replacement        Dr. [name] Aberman, Jr., M.D.      Dept. of Orthopaedics & Sports Medicine  [facility]  [address]  [place], [place]  [address]  Phone: [phone]   Fax: [phone]    DIET: Drink plenty of non-alcoholic fluids. Resume your normal diet. Include foods high in fiber.  ACTIVITY:  You may use crutches or a walker with weight-bearing as tolerated, unless instructed otherwise. You may be weaned off of the walker or crutches by your Physical Therapist.  Continue doing gentle exercises. Exercising will reduce the pain and swelling, increase motion, and prevent muscle weakness.   Please continue to use the TED compression stockings for 2 weeks. You may remove the stockings at night, but should reapply them in the morning. Do not drive or operate any equipment until instructed.  WOUND CARE:  Continue to use ice packs periodically to reduce pain and swelling. You may shower with honeycomb dressing 3 days after your surgery. Do not submerge incision site under water . Remove honeycomb dressing 7 days after surgery and allow dermabond to fall off on its own.   MEDICATIONS: You may resume your regular medications. Please take the pain medication as prescribed on the medication list. Do not take pain medication on an empty stomach. You have been given a prescription for a blood thinner to prevent blood clots. Please take the Lovenox  as instructed.  Pain medications and iron  supplements can cause constipation. Use a stool softener (Senokot or Colace) on a daily basis and a laxative (dulcolax or miralax ) as needed. Do not drive or drink alcoholic beverages when taking pain medications.  POSTOPERATIVE CONSTIPATION PROTOCOL Constipation - defined medically as fewer than three stools per week and severe constipation as less than one stool per week.  One of the most common issues patients have following surgery is  constipation.  Even if you have a regular bowel pattern at home, your normal regimen is likely to be disrupted due to multiple reasons following surgery.  Combination of anesthesia, postoperative narcotics, change in appetite and fluid intake all can affect your bowels.  In order to avoid complications following surgery, here are some recommendations in order to help you during your recovery period.  Colace (docusate) - Pick up an over-the-counter form of Colace or another stool softener and take twice a day as long as you are requiring postoperative pain medications.  Take with a full glass of water  daily.  If you experience loose stools or diarrhea, hold the colace until you stool forms back up.  If your symptoms do not get better within 1 week or if they get worse, check with your doctor.  Dulcolax (bisacodyl ) - Pick up over-the-counter and take as directed by the product packaging as needed to assist with the movement of your bowels.  Take with a full glass of water .  Use this product as needed if not relieved by Colace only.   MiraLax  (polyethylene glycol) - Pick up over-the-counter to have on hand.  MiraLax  is a solution that will increase the amount of water  in your bowels to assist with bowel movements.  Take as directed and can mix with a glass of water , juice, soda, coffee, or tea.  Take if you go more than two days without a movement. Do not use MiraLax  more than once per day. Call your doctor if you are still constipated or irregular after using this medication for 7 days in  a row.  If you continue to have problems with postoperative constipation, please contact the office for further assistance and recommendations.  If you experience the worst abdominal pain ever or develop nausea or vomiting, please contact the office immediatly for further recommendations for treatment.   CALL THE OFFICE FOR: Temperature above 101 degrees Excessive bleeding or drainage on the dressing. Excessive  swelling, coldness, or paleness of the toes. Persistent nausea and vomiting.  FOLLOW-UP:  You should have an appointment to return to the office in 2 weeks after surgery. Arrangements have been made for continuation of Physical Therapy (either home therapy or outpatient therapy).

## 2023-09-21 NOTE — Op Note (Signed)
 Patient Name: Debbie Sosa  FMW:[id number]  Pre-Operative Diagnosis: Left hip Osteoarthritis  Post-Operative Diagnosis: (same)  Procedure: Left Total Hip Arthroplasty  Components/Implants: Cup: Trident Tritanium Clusterhole 46/C  w/ x2 screws    Liner: Neutral X3 poly 32/C  Stem: Insignia #1 high offset  Head:Biolox ceramic 32 +0 mm  Date of Surgery: 09/21/2023  Surgeon: Arthea Sheer MD  Assistant: Debby Amber PA (present and scrubbed throughout the case, critical for assistance with exposure, retraction, instrumentation, and closure)   Anesthesiologist: Chesley  Anesthesia: Spinal   EBL: 150cc  IVF:600cc  Complications: None   Brief history: The patient is a 48 year old female with a history of osteoarthritis of the left hip with pain limiting their range of motion and activities of daily living, which has failed multiple attempts at conservative therapy.  The risks and benefits of total hip arthroplasty as definitive surgical treatment were discussed with the patient, who opted to proceed with the operation.  After outpatient medical clearance and optimization was completed the patient was admitted to [facility] for the procedure.  All preoperative films were reviewed and an appropriate surgical plan was made prior to surgery.   Description of procedure: The patient was brought to the operating room where laterality was confirmed by all those present to be the left side.  The patient was administered spinal anesthesia on a stretcher prior to being moved supine on the operating room table. Patient was given an intravenous dose of antibiotics for surgical prophylaxis and TXA.  All bony prominences and extremities were well padded and the patient was securely attached to the table boots, a perineal post was placed and the patient had a safety strap placed.  Surgical site was prepped with alcohol and chlorhexidine . The surgical site over the hip was and draped  in typical sterile fashion with multiple layers of adhesive and nonadhesive drapes.  The incision site was marked out with a sterile marker and care was taken to assess the position of the ASIS and ensure appropriate position for the incision.    A surgical timeout was then called with participation of all staff in the room the patient was then a confirmed again and laterality confirmed.  Incision was made over the anterior lateral aspect of the proximal thigh in line with the TFL.  Appropriate retractors were placed and all bleeding vessels were coagulated within the subcutaneous and fatty layers.  An incision was made in the TFL fascia in the interval was carefully identified.  The lateral ascending branches of the circumflex vessels were identified, cauterized and carefully dissected. The main vessels were then tied with a 0 silk hand tie.  Retractors were placed around the superior lateral and inferior medial aspects of the femoral neck and a capsulotomy was performed exposing the hip joint.  Retraction stitches were placed and the capsulotomy to assist with visualization.  Femoral neck cut was then made and the femoral head was extracted after placing the leg in traction.  Bone wax was then applied to the proximal cut surface of the femur and water  cooled bipolar electrocautery was used to address any bleeding around the femoral neck cut.  Retractors were then placed around the acetabulum to fully visualize the joint space, and the remaining labral tissue was removed and pulvinar was removed.   The acetabulum was then sequentially reamed up to the appropriate size in order to get good fit and fill for the acetabular component while under fluoroscopic guidance.  Acetabular component was then  placed and malleted into a secure fit while confirming position and abduction angle and anteversion utilizing fluoroscopy.  2 screws were then placed in the acetabular cup to assist in securing the cup in place. The cup  was irrigated,  a real neutral liner was placed, impacted, and checked for stability. The femur traction was dropped and sequentially externally rotated while performing a release of the posterior and superomedial tissues off of the proximal femur to allow for mobility, care was taken to preserve the external rotators and piriformis attachments.  The remaining interval between the abductors and the capsule was dissected out and a retractor was placed over the superolateral aspect of the femur over the greater trochanter.  The leg was carefully brought down into extension and adducted to provide visualization of the proximal femur for broaching.  The femur was then sequentially broached up to an appropriate size which provided for good fill and stability to the femoral broach.  A trial neck and head were placed on the femoral broach and the leg was brought up for reduction.  The hip was reduced and manual check of stability was performed.  The hip was found to be stable in flexion internal rotation and extension external rotation.  Leg lengths were confirmed on fluoroscopy.   The hip was then dislocated the trial neck and head were removed.  The leg was then brought down into extension and adduction in the proximal femur was reexposed.  The broach trial was removed and the femur was irrigated with normal saline prior to the real femoral stem being implanted.  After the femoral stem was seated and shown to have good fit and fill the appropriate head was impacted the leg was brought up and reduced.  There was good range of motion with stability in flexion internal rotation and extension external rotation on testing.  Leg lengths were found to be appropriate on fluoroscopic evaluation at this time.  The hip was then irrigated with irrisept solution and then saline solution.  The capsulotomy was repaired with Ethibond sutures.  A pericapsular and peritrochanteric cocktail with Exparel  and bupivacaine  was then injected  as well as the subcutaneous tissues. The fascia was closed with a #1 barbed running suture.  The deep tissues were closed with Vicryl sutures the subcutaneous tissues were closed with interrupted Vicryl sutures and a running barbed 4-0 suture.  The skin was then reinforced with Dermabond and a sterile dressing was placed. The skin surrounding the incision was cleaned with soap and water  to remove any prep or residue from draping.   The patient was awoken from anesthesia transferred off of the operating room table onto a hospital bed where examination of leg lengths found the leg lengths to be equal with a good distal pulse.  The patient was then transferred to the PACU in stable condition.

## 2023-09-21 NOTE — Discharge Summary (Signed)
 Physician Discharge Summary  Patient ID: Debbie Sosa MRN: [id number] DOB/AGE: 48/09/1975 48 y.o.  Admit date: 09/21/2023 Discharge date: 09/22/23  Admission Diagnoses:  Tear of left gluteus minimus tendon, subsequent encounter [S76.012D] Primary osteoarthritis of left hip [M16.12] S/P total left hip arthroplasty [Z96.642]   Discharge Diagnoses: Patient Active Problem List   Diagnosis Date Noted   S/P total left hip arthroplasty 09/21/2023   Lumbar radiculitis 08/23/2023   Left hip pain 08/22/2023   Osteoarthritis of left hip 06/21/2023   Hyperglycemia 01/25/2022   Arthritis of left foot 11/01/2021   Menorrhagia with regular cycle 09/17/2021   Retained intrauterine contraceptive device (IUD) 09/17/2021   Vitamin D  deficiency 09/01/2021   Lymphedema 03/07/2021   Chronic venous insufficiency 03/07/2021   Iron  deficiency anemia secondary to inadequate dietary iron  intake 02/05/2021   B12 deficiency 08/20/2020   Antiphospholipid antibody positive 01/27/2020   Iron  deficiency 01/27/2020   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   Rheumatoid arthritis involving multiple sites with positive rheumatoid factor (HCC) 09/19/2019   Shortness of breath 03/25/2019   History of COVID-19 03/25/2019   Chronic cough 03/25/2019   Hypoxia 01/13/2019   Pneumonia due to COVID-19 virus 01/13/2019   Personal history of pulmonary embolism 01/13/2019   Bilateral leg pain 01/13/2019   Allergic rhinitis 12/04/2017   Acute laryngitis 08/17/2016   Dysphagia 08/17/2016   Meningitis 08/17/2016   Singers' nodes 08/17/2016   Diarrhea, unspecified 10/14/2015   Left lower quadrant pain 10/14/2015   Nausea and vomiting 10/14/2015   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   S/P gastric bypass 07/23/2015   Achalasia 01/07/2015   Central sleep apnea 01/07/2015   Obesity (BMI 30-39.9) 01/07/2015   GERD (gastroesophageal reflux disease) 12/25/2014   Pulmonary embolism (HCC)    Plantar fasciitis of  right foot 12/31/2013    Past Medical History:  Diagnosis Date   Anemia    Antiphospholipid antibody positive    Arthritis    Asthma    seasonal   Chronic venous insufficiency    COVID-19 2020   DDD (degenerative disc disease), lumbar    GERD (gastroesophageal reflux disease)    Headache    h/o migraines   History of hiatal hernia    repaired with Gastric bypass   History of kidney stones    Iron  deficiency    recieves iron  infusions every 3 months at the cancer center   Lymphedema    bil legs-sees [facility]   Nausea and vomiting 10/14/2015   Obesity    Pneumonia    Pulmonary embolism (HCC)    after 2nd c-section   Sleep apnea    had gastric bypass and does not need to use cpap since surgery   Vitamin B12 deficiency      Transfusion: none   Consultants (if any):   Discharged Condition: Improved  Hospital Course: SHERYLE VICE is an 48 y.o. female who was admitted 09/21/2023 with a diagnosis of S/P total left hip arthroplasty and went to the operating room on 09/21/2023 and underwent the above named procedures.    Surgeries: Procedure(s): ARTHROPLASTY, HIP, TOTAL, ANTERIOR APPROACH on 09/21/2023 Patient tolerated the surgery well. Taken to PACU where she was stabilized and then transferred to the orthopedic floor.  Started on Lovenox  40 mg q 24 hrs. TEDs and SCDs applied bilaterally. Heels elevated on bed. No evidence of DVT. Negative Homan. Physical therapy started on day #1 for gait training and transfer. OT started day #1 for ADL and assisted devices.  Patient's IV  was d/c on day #1. Patient was able to safely and independently complete all PT goals. PT recommending discharge to home.    On post op day #1 patient was stable and ready for discharge to home with HHPT.  Implants: : Cup: Trident Tritanium Clusterhole 46/C  w/ x2 screws    Liner: Neutral X3 poly 32/C  Stem: Insignia #1 high offset  Head:Biolox ceramic 32 +0 mm    She was given perioperative  antibiotics:  Anti-infectives (From admission, onward)    Start     Dose/Rate Route Frequency Ordered Stop   09/21/23 1930  vancomycin  (VANCOCIN ) IVPB 1000 mg/200 mL premix        1,000 mg 200 mL/hr over 60 Minutes Intravenous Every 12 hours 09/21/23 1349 09/21/23 2057   09/21/23 0615  levofloxacin  (LEVAQUIN ) IVPB 500 mg        500 mg 100 mL/hr over 60 Minutes Intravenous On call to O.R. 09/21/23 0612 09/21/23 0802   09/21/23 0615  vancomycin  (VANCOREADY) IVPB 1000 mg/200 mL        1,000 mg 200 mL/hr over 60 Minutes Intravenous On call to O.R. 09/21/23 [id number] 09/21/23 1415     .  She was given sequential compression devices, early ambulation, and Lovenox  TEDs for DVT prophylaxis.  She benefited maximally from the hospital stay and there were no complications.    Recent vital signs:  Vitals:   09/21/23 2220 09/22/23 0346  BP: 126/80 117/71  Pulse: 69 83  Resp: 16 18  Temp:  (!) 97 F (36.1 C)  SpO2: 98% 95%    Recent laboratory studies:  Lab Results  Component Value Date   HGB 11.4 (L) 09/22/2023   HGB 12.8 09/08/2023   HGB 12.8 08/23/2023   Lab Results  Component Value Date   WBC 12.3 (H) 09/22/2023   PLT 272 09/22/2023   Lab Results  Component Value Date   INR 1.0 05/11/2018   Lab Results  Component Value Date   NA 137 09/22/2023   K 4.3 09/22/2023   CL 102 09/22/2023   CO2 27 09/22/2023   BUN 14 09/22/2023   CREATININE 0.66 09/22/2023   GLUCOSE 100 (H) 09/22/2023    Discharge Medications:   Allergies as of 09/22/2023       Reactions   Amoxicillin  Anaphylaxis   Throat swelling   Avocado Anaphylaxis   Cephalexin  Hives, Rash   Fexofenadine Shortness Of Breath   Iodinated Contrast Media Hives   Ketorolac  Hives   Morphine Anaphylaxis   Penicillins Swelling   Throat swelling   Promethazine Hives   Diflucan  [fluconazole ] Rash   Cetirizine Other (See Comments)   Skin eruptures Patient denies   Diclofenac  Sodium Hives   Doxycycline Other (See  Comments)   Latex    With condoms   Other Hives   Pain medicine that starts with an L   Tramadol  Hives   Can take with benadryl    Hepatitis B Virus Vaccines Rash   Misc. Sulfonamide Containing Compounds Rash   Morphine And Codeine Rash   Naproxen Rash   Oxycodone -acetaminophen  Itching   Pyridium [phenazopyridine Hcl] Rash   Sulfa Antibiotics Rash   Sulfasalazine Rash   Vioxx [rofecoxib] Rash        Medication List     TAKE these medications    albuterol  108 (90 Base) MCG/ACT inhaler Commonly known as: VENTOLIN  HFA Inhale 1-2 puffs INH Q4-6hr prn for chest tightness, cough, wheezing, SOB/DOE.   cyanocobalamin  1000 MCG/ML injection Commonly  known as: VITAMIN B12 Inject 1,000 mcg into the muscle once a week.   diphenhydrAMINE  25 mg capsule Commonly known as: BENADRYL  Take 1 capsule (25 mg total) by mouth every 6 (six) hours as needed for itching or allergies.   docusate sodium  100 MG capsule Commonly known as: COLACE Take 1 capsule (100 mg total) by mouth 2 (two) times daily.   enoxaparin  40 MG/0.4ML injection Commonly known as: LOVENOX  Inject 0.4 mLs (40 mg total) into the skin daily for 28 days.   EPINEPHrine  0.3 mg/0.3 mL Soaj injection Commonly known as: EpiPen  2-Pak Inject 0.3 mLs (0.3 mg total) into the muscle as needed for anaphylaxis.   esomeprazole  40 MG capsule Commonly known as: NEXIUM  Take 1 capsule (40 mg total) by mouth daily at 12 noon.   famotidine  20 MG tablet Commonly known as: PEPCID  Take 1 tablet (20 mg total) by mouth 2 (two) times daily. What changed:  when to take this reasons to take this   HYDROcodone -acetaminophen  5-325 MG tablet Commonly known as: NORCO/VICODIN Take 2 tablets by mouth every 4 (four) hours as needed for moderate pain (pain score 4-6).   levocetirizine 5 MG tablet Commonly known as: XYZAL  Take 5 mg by mouth every morning.   levonorgestrel  20 MCG/DAY Iud Commonly known as: MIRENA  by Intrauterine route.    methocarbamol  500 MG tablet Commonly known as: ROBAXIN  Take 1 tablet (500 mg total) by mouth every 8 (eight) hours as needed for muscle spasms. What changed: Another medication with the same name was added. Make sure you understand how and when to take each.   methocarbamol  500 MG tablet Commonly known as: ROBAXIN  Take 1 tablet (500 mg total) by mouth every 6 (six) hours as needed for muscle spasms. What changed: You were already taking a medication with the same name, and this prescription was added. Make sure you understand how and when to take each.   ondansetron  4 MG tablet Commonly known as: ZOFRAN  Take 1 tablet (4 mg total) by mouth every 6 (six) hours as needed for nausea.   oxyCODONE  5 MG immediate release tablet Commonly known as: Oxy IR/ROXICODONE  Take 1 tablet (5 mg total) by mouth every 4 (four) hours as needed for moderate pain (pain score 4-6) or breakthrough pain.   Vitamin D  (Ergocalciferol ) 1.25 MG (50000 UNIT) Caps capsule Commonly known as: DRISDOL  Take 50,000 Units by mouth once a week. Mondays               Durable Medical Equipment  (From admission, onward)           Start     Ordered   09/22/23 0758  For home use only DME Vannie  Once       Question:  Patient needs a walker to treat with the following condition  Answer:  H/O total hip arthroplasty, left   09/22/23 0757   09/22/23 0758  For home use only DME 3 n 1  Once        09/22/23 0757            Diagnostic Studies: DG HIP UNILAT WITH PELVIS 2-3 VIEWS LEFT Result Date: 09/21/2023 CLINICAL DATA:  Left hip replacement. EXAM: DG HIP (WITH OR WITHOUT PELVIS) 2-3V LEFT COMPARISON:  09/11/2023 FINDINGS: Three C-arm images demonstrate a left total hip prosthesis in satisfactory position and alignment. No fracture or dislocation seen. An intrauterine device is in expected position in the central pelvis. IMPRESSION: Satisfactory appearance of a left total hip prosthesis. Electronically  Signed    By: Elspeth Bathe M.D.   On: 09/21/2023 09:34   DG C-Arm 1-60 Min-No Report Result Date: 09/21/2023 Fluoroscopy was utilized by the requesting physician.  No radiographic interpretation.   DG C-Arm 1-60 Min-No Report Result Date: 09/21/2023 Fluoroscopy was utilized by the requesting physician.  No radiographic interpretation.   PERIPHERAL VASCULAR CATHETERIZATION Result Date: 09/20/2023 See surgical note for result.   Disposition:  There are no questions and answers to display.           Follow-up Information     Charlene Debby BROCKS, PA-C Follow up in 2 week(s).   Specialties: Orthopedic Surgery, Emergency Medicine Contact information: [address] [place] [address] [phone]                  Signed: Debby BROCKS Charlene 09/22/2023, 8:04 AM

## 2023-09-21 NOTE — H&P (Signed)
 History of Present Illness: Debbie Sosa is an 48 y.o. female who presents for follow-up evaluation of her left hip. Patient has had severe pain in her left hip since last year which has been worsening over time and causing difficulty moving the left leg and walking. It interferes with her activities of daily basis with pain up to a 10 out of 10 in her left groin. She is undergone several joint injections with short-term relief and diminishing returns on other conservative treatment since that time. Her x-rays have progressively worsened and she is here today to discuss left hip replacement. The patient does report a history of a blood clot after her C-section many years ago but is not on any other clots since that time. The patient denies fevers, chills, numbness, tingling, shortness of breath, chest pain, recent illness, or any trauma.  Of note the patient did have radiculopathy at her last visit rating down her left leg and went and saw physiatry and had an injection and reports it is improved since that time with no more back pain or radiating pain at this time.  Patient is a non-smoker nondiabetic with an A1c of 5.8 and a BMI of 35.7  Past Medical History: Past Medical History:  Diagnosis Date  Abnormal findings on diagnostic imaging of breast 09/06/2016  Achalasia 01/07/2015  s/p heller myotomy  Allergy  Antiphospholipid antibody positive 01/27/2020  Dr. Govinda Brtahmanday (Oncology) doesn't think patient has antiphospholipid antibody syndrome; I suspect the aberrant cardiolipin IgM is related to her underlying rheumatologic disease.  Arthritis  as per pt. no arthritis  Asthma without status asthmaticus (HHS-HCC)  B12 deficiency  Chronic venous insufficiency  Clotting disorder (CMS/HHS-HCC)  COVID-19 12/2018  DDD (degenerative disc disease), lumbar  Family history of breast cancer  mom, 2 cousins  GERD (gastroesophageal reflux disease) 12/25/2014  Hypoxia 01/13/2019  Iron   deficiency anemia secondary to inadequate dietary iron  intake 02/05/2021  poor tolerance to oral iron   Lymphedema  Lymphoma (CMS/HHS-HCC) 2022  bilateral legs, [facility]- Dr. Delores  Meningitis (HHS-HCC) 2003  Obesity  Personal history of pulmonary embolism  Plantar fasciitis of right foot 12/31/2013  Pneumonia due to COVID-19 virus 01/13/2019  Postoperative malabsorption (HHS-HCC) 07/2015  Pulmonary embolus in pregnancy childbirth or puerperium (HHS-HCC)  Rheumatoid arthritis involving multiple sites with positive rheumatoid factor (CMS/HHS-HCC)  Singers' nodes 08/17/2016  Sleep apnea  does not use c-pap but has it with her  SOB (shortness of breath) 03/25/2019  SOB with pneumonia due COVID pneumonia   Past Surgical History: Past Surgical History:  Procedure Laterality Date  CHOLECYSTECTOMY 03/1998  REDUCTION MAMMAPLASTY Bilateral 12/2003  BREAST BIOPSY Left 2006  benign/clip  BREAST BIOPSY Right 2015  benign/clip  ESOPHAGEAL MOTILITY STUDY W/INT & REP 12/10/2014  EGD 12/25/2014  Procedure: ESOPHAGOGASTRODUODENOSCOPY, FLEXIBLE, TRANSORAL; DIAGNOSTIC, INCLUDING COLLECTION OF SPECIMEN(S) BY BRUSHING OR WASHING, WHEN PERFORMED (SEPARATE PROCEDURE); Surgeon: Alm Locust, MD; Location[facility] ENDO[facility]; Service: Gastroenterology;;  EXPLORATORY LAPAROTOMY 01/2015  ESOPHAGUS SURGERY 01/2015  LAPAROSCOPIC ESOPHAGOMYOTOMY W/FUNDOPLASTY N/A 02/05/2015  Procedure: LAPAROSCOPY, SURGICAL; ESOPHAGOMYOTOMY (HELLER TYPE), WITH FUNDOPLASTY, WHEN PERFORMED; Surgeon: Lonell Cheryl Fletcher, MD; Location[facility] OR; Service: General Surgery; Laterality: N/A;  REVISION GASTROJEJUNOSTOMY N/A 02/05/2015  Procedure: LAPAROSCOPIC Gastrectomy withy Roux en y Reconstruction; Surgeon: Lonell Cheryl Fletcher, MD; Location: [facility] OR; Service: General Surgery; Laterality: N/A;  EGD N/A 11/06/2015  Procedure: EGD; Surgeon: Lonell Cheryl Fletcher, MD; Location: [facility] OR; Service: General Surgery; Laterality: N/A;   COLONOSCOPY N/A 11/06/2015  Procedure: COLONOSCOPY, FLEXIBLE; DIAGNOSTIC; Surgeon: Lonell Cheryl  Portenier, MD; Location: [facility] OR; Service: General Surgery; Laterality: N/A;  ESOPHAGOGASTRODUODENOSCOPY (EGD) WITH PROPOFOL  10/04/2016  Dr. Ruel Kung  ESOPHAGOGASTRODUODENOSCOPY (EGD) WITH PROPOFOL  04/14/2017  Dr. Ruel Kung  SINUS SURGERY Bilateral 10/20/2017  Bilateral pansinusotomy  ESOPHAGOGASTRODUODENOSCOPY (EGD) WITH PROPOFOL  10/31/2018  Dr. Ruel Kung  COLONOSCOPY WITH PROPOFOL  10/31/2018  Dr. Ruel Kung  ESOPHAGOGASTRODUODENOSCOPY (EGD) WITH PROPOFOL  06/28/2019  Dr. Ruel Kung  ESOPHAGOGASTRODUODENOSCOPY (EGD) WITH PROPOFOL  02/10/2021  Dr. Ruel Kung  DILATATION AND CURETTAGE /HYSTEROSCOPY INTRAUTERINE DEVICE (IUD) REMOVAL INTRAUTERINE DEVICE (IUD) INSERTION 09/17/2021  Dr. Garnette Mace  BREAST SURGERY  CESAREAN DELIVERY 2000, 2002  LAPAROSCOPIC GASTRIC BYPASS  ORTHOPEDIC SURGERY Right  arthroscopy   Past Family History: Family History  Problem Relation Age of Onset  Breast cancer Mother  Diabetes Mother  High blood pressure (Hypertension) Mother  Gout Mother  Rheum arthritis Mother  Cancer Mother  breast  Gout Father  Asthma Father  High blood pressure (Hypertension) Father  Cancer Father  deceased  Kidney cancer Father  Kidney disease Father  Diabetes Father  deceased  Heart disease Father  deceased  Rheum arthritis Father  Osteoporosis (Thinning of bones) Father  Asthma Sister  Kidney disease Paternal Grandmother  Stroke Paternal Grandfather  Breast cancer Cousin  Breast cancer Cousin  Asthma Child   Medications: Current Outpatient Medications  Medication Sig Dispense Refill  albuterol  MDI, PROVENTIL , VENTOLIN , PROAIR , HFA 90 mcg/actuation inhaler Inhale 2 inhalations into the lungs every 4 (four) hours as needed for Wheezing 1 each 3  cyanocobalamin  (VITAMIN B12) 1,000 mcg/mL injection Inject 1 mL (1,000 mcg total) into the muscle once a week for 28  days, THEN 1 mL (1,000 mcg total) monthly for 120 days. 8 mL 0  EPINEPHrine  (EPIPEN ) 0.3 mg/0.3 mL auto-injector Inject 0.3 mLs (0.3 mg total) into the muscle as needed for Anaphylaxis 1 Pen 2  ergocalciferol , vitamin D2, 1,250 mcg (50,000 unit) capsule Take 1 capsule (50,000 Units total) by mouth once a week 12 capsule 3  fluticasone  propion-salmeteroL (ADVAIR  DISKUS) 250-50 mcg/dose diskus inhaler Inhale 1 Puff into the lungs every 12 (twelve) hours 1 each 12  HYDROcodone -acetaminophen  (NORCO) 5-325 mg tablet Take 1 tablet by mouth every 4 (four) hours as needed  levocetirizine (XYZAL ) 5 MG tablet Take 5 mg by mouth once daily  levonorgestreL  (MIRENA  52 MG) IUD Insert 1 each into the uterus once Inserted 09/17/21  methocarbamoL  (ROBAXIN ) 500 MG tablet Take 500 mg by mouth every 8 (eight) hours as needed  omeprazole  (PRILOSEC) 40 MG DR capsule  pregabalin (LYRICA) 25 MG capsule Take 1 capsule p.o. nightly for 4-5 nights, then increase to 1 capsule p.o. twice daily for 4 to 5 days, then increase to 1 capsule p.o. 3 times daily 45 capsule 0  syringe with needle 1 mL 25 gauge x 1 syringe Use as directed 8 each 0  traZODone (DESYREL) 50 MG tablet Take 1 tablet (50 mg total) by mouth at bedtime as needed 30 tablet 5   No current facility-administered medications for this visit.   Allergies: Allergies  Allergen Reactions  Allegra [Fexofenadine] Shortness Of Breath  Amoxicillin  Anaphylaxis  Sob, itching  Iodinated Contrast Media Rash and Hives  Can be premedicated prior to procedure  Keflex  [Cephalexin ] Hives and Rash  Metrizamide Hives  Morphine Rash  Shortness of breath also  Penicillins Swelling  Throat swelling  Pyridium [Phenazopyridine] Rash  Fluconazole  Rash  Aleve [Naproxen Sodium] Itching  Allergen Extract-Food-Avocado Unknown  Cetirizine Other (See Comments)  Skin eruptures Skin eruptures Skin  eruptures  Diclofenac  Sodium Hives  Doxycycline Unknown  Egg Rash  Other  reaction(s): RASH Other reaction(s): RASH  Levaquin  [Levofloxacin ] Unknown  Other Hives  Pain medicine that starts with an L  Percocet [Oxycodone -Acetaminophen ] Itching  Phenazopyridine Hcl Unknown  Phenergan [Promethazine] Hives  Pyrimidine Analogues Rash  Rofecoxib Unknown  Toradol  [Ketorolac ] Hives  Tramadol  Hives  Hepatitis B Virus Vaccine Rash  Latex Rash  Sulfasalazine Rash    Visit Vitals: There were no vitals filed for this visit.   Review of Systems:  A comprehensive 14 point ROS was performed, reviewed, and the pertinent orthopaedic findings are documented in the HPI.  Physical Exam: There is no height or weight on file to calculate BMI. General/Constitutional: No apparent distress: well-nourished and well developed. Lymphatic: No palpable adenopathy. Pulmonary exam: Lungs clear to auscultation bilaterally no wheezing rales or rhonchi Cardiac exam: Regular rate and rhythm no obvious murmurs rubs or gallops. Vascular: No edema, swelling or tenderness, except as noted in detailed exam. Integumentary: No impressive skin lesions present, except as noted in detailed exam. Neuro/Psych: Normal mood and affect, oriented to person, place and time. Musculoskeletal: Normal, except as noted in detailed exam and in HPI.  Left hip exam  SKIN: intact SWELLING: none WARMTH: no warmth TENDERNESS: none, Stinchfield Positive ROM: 0 degrees internal rotation and 20 degrees external rotation and pain with internal rotation and external rotation and any hip flexion localized to the groin,; Hip Flexion 85 STRENGTH: limited by pain GAIT: antalgic and stiff-legged STABILITY: stable to testing CREPITUS: no LEG LENGTH DISCREPANCY: none NEUROLOGICAL EXAM: normal VASCULAR EXAM: normal LUMBAR SPINE: tenderness: no straight leg raising sign: no motor exam: normal  The contralateral hip was examined for comparison and it showed: TENDERNESS: none ROM: normal and full STRENGTH:  normal STABILITY: stable to testing  Hip Imaging :  I have reviewed AP pelvis and lateral hip X-rays (2 views) taken today in the office of the left hip which reveal moderate degenerative changes with bone-on-bone reticulation of the left femoral head in the acetabulum with osteophyte formation sclerosis and a small cam lesion. Mildly worse than prior films. Right hip shows mild to moderate generative changes with joint space narrowing sclerosis and osteophyte formation on femoral head.   Assessment:  Left hip osteoarthritis  Plan: Alanah is a 48 year old female presents with left hip bone on bone arthritis. Based upon the patient's continued symptoms and failure to respond to conservative treatment, I have recommended a left total hip replacement for this patient. A long discussion took place with the patient describing what a total joint replacement is and what the procedure would entail. A hip model, similar to the implants that will be used during the operation, was utilized to demonstrate the implants. Choices of implant manufactures were discussed and reviewed. The ability to secure the implant utilizing cement or cementless (press fit) fixation was discussed. Anterior and posterior exposures were discussed. For this patient an appropriate approach will be anterior.   The hospitalization and post-operative care and rehabilitation were also discussed. The use of perioperative antibiotics and DVT prophylaxis were discussed. The risk, benefits and alternatives to a surgical intervention were discussed at length with the patient. The patient was also advised of risks related to the medical comorbidities and elevated body mass index (BMI). A lengthy discussion took place to review the most common complications including but not limited to: deep vein thrombosis, pulmonary embolus, heart attack, stroke, infection, wound breakdown, heterotopic ossification, dislocation, numbness, leg length in-equality,  intraoperative fracture, damage to nerves, tendon,muscles, arteries or other blood vessels, death and other possible complications from anesthesia. The patient was told that we will take steps to minimize these risks by using sterile technique, antibiotics and DVT prophylaxis when appropriate and follow the patient postoperatively in the office setting to monitor progress. The possibility of recurrent pain, no improvement in pain and actual worsening of pain were also discussed with the patient. The risk of dislocation following total hip replacement was discussed and potential precautions to prevent dislocation were reviewed. We also had a specific conversation about her young age and her increased risk for need of revision during her life.  Patient asked about and confirms no history of any reactions to metal or metal allergy in the past.  The discharge plan of care focused on the patient going home following surgery. The patient was encouraged to make the necessary arrangements to have someone stay with them when they are discharged home.   The benefits of surgery were discussed with the patient including the potential for improving the patient's current clinical condition through operative intervention. Alternatives to surgical intervention including continued conservative management were also discussed in detail. All questions were answered to the satisfaction of the patient. The patient participated and agreed to the plan of care as well as the use of the recommended implants for their total hip replacement surgery. An information packet was given to the patient to review prior to surgery.   The patient received clearance for surgery. IVCF was placed without complication. Discussed use of lovenox  postoperatively to reduce the risk of DVT.   Portions of this record have been created using Scientist, clinical (histocompatibility and immunogenetics). Dictation errors have been sought, but may not have been identified and  corrected.  Arthea Sheer MD

## 2023-09-21 NOTE — Anesthesia Procedure Notes (Addendum)
 Spinal  Patient location during procedure: OR Start time: 09/21/2023 7:40 AM End time: 09/21/2023 7:46 AM Reason for block: surgical anesthesia Staffing Performed: resident/CRNA  Resident/CRNA: Lorrene Camelia LABOR, CRNA Performed by: Lorrene Camelia LABOR, CRNA Authorized by: Chesley Lendia CROME, MD   Preanesthetic Checklist Completed: patient identified, IV checked, site marked, risks and benefits discussed, surgical consent, monitors and equipment checked, pre-op evaluation and timeout performed Spinal Block Patient position: sitting Prep: ChloraPrep Patient monitoring: heart rate, cardiac monitor, continuous pulse ox and blood pressure Approach: midline Location: L3-4 Injection technique: single-shot Needle Needle type: Sprotte  Needle gauge: 24 G Needle length: 9 cm Needle insertion depth: 9 cm Assessment Sensory level: T6 Events: CSF return Additional Notes Negative paresthesia. Negative blood return. Positive free-flowing CSF. Expiration date of kit checked and confirmed. Patient tolerated procedure well, without complications.

## 2023-09-21 NOTE — Anesthesia Procedure Notes (Addendum)
 Date/Time: 09/21/2023 7:39 AM  Performed by: Lorrene Camelia LABOR, CRNAOxygen Delivery Method: Simple face mask Preoxygenation: Pre-oxygenation with 100% oxygen

## 2023-09-21 NOTE — Interval H&P Note (Signed)
Patient history and physical updated. Consent reviewed including risks, benefits, and alternatives to surgery. Patient agrees with above plan to proceed with left anterior total hip arthroplasty  

## 2023-09-21 NOTE — Evaluation (Signed)
 Physical Therapy Evaluation Patient Details Name: Debbie Sosa MRN: [id number] DOB: [birth year] Today's Date: 09/21/2023  History of Present Illness  48 y/o female s/p L THA on 09/21/23. PMH: sleep apnea, anemia, RA  Clinical Impression  Patient admitted with the above. PTA, patient lives with children and was modI with use of SPC prior to surgery and still working. Patient required minA for bed mobility with use of R foot to hook LLE to assist on bed. Unable to use R foot to get LLE off bed. Stood from bedside with CGA and RW. Ambulated 44' with RW and CGA-supervision with very slow cadence and heavy use of BUE support. Complaining of 8-9/10 pain in L hip. Patient will benefit from skilled PT services during acute stay to address listed deficits. Patient will benefit from ongoing therapy at discharge to maximize functional independence and safety.         If plan is discharge home, recommend the following: A little help with walking and/or transfers;A little help with bathing/dressing/bathroom;Assistance with cooking/housework;Assist for transportation;Help with stairs or ramp for entrance   Can travel by private vehicle        Equipment Recommendations Rolling Andreus Cure (2 wheels)  Recommendations for Other Services       Functional Status Assessment Patient has had a recent decline in their functional status and demonstrates the ability to make significant improvements in function in a reasonable and predictable amount of time.     Precautions / Restrictions Precautions Precautions: Fall Recall of Precautions/Restrictions: Intact Restrictions Weight Bearing Restrictions Per Provider Order: No      Mobility  Bed Mobility Overal bed mobility: Needs Assistance Bed Mobility: Supine to Sit, Sit to Supine     Supine to sit: Min assist Sit to supine: Contact guard assist   General bed mobility comments: able to utilize R foot hooked under LLE to assist back on bed     Transfers Overall transfer level: Needs assistance Equipment used: Rolling Garrick Midgley (2 wheels) Transfers: Sit to/from Stand Sit to Stand: Contact guard assist                Ambulation/Gait Ambulation/Gait assistance: Contact guard assist, Supervision Gait Distance (Feet): 40 Feet Assistive device: Rolling Novella Abraha (2 wheels) Gait Pattern/deviations: Step-to pattern, Decreased stride length, Decreased stance time - left Gait velocity: decreased     General Gait Details: CGA-supervision for safety. Heavy use of B UE support on RW to advance and place weight on LLE  Stairs            Wheelchair Mobility     Tilt Bed    Modified Rankin (Stroke Patients Only)       Balance Overall balance assessment: Needs assistance Sitting-balance support: Feet supported, No upper extremity supported Sitting balance-Leahy Scale: Good     Standing balance support: Bilateral upper extremity supported, During functional activity Standing balance-Leahy Scale: Fair                               Pertinent Vitals/Pain Pain Assessment Pain Assessment: 0-10 Pain Score: 8  Pain Location: L hip Pain Descriptors / Indicators: Discomfort, Sore Pain Intervention(s): Limited activity within patient's tolerance, Monitored during session, Repositioned    Home Living Family/patient expects to be discharged to:: Private residence Living Arrangements: Children Available Help at Discharge: Available PRN/intermittently Type of Home: House Home Access: Stairs to enter Entrance Stairs-Rails: Right;Left;Can reach both Entrance Stairs-Number of Steps: 4  Home Layout: One level Home Equipment: Agricultural consultant (2 wheels)      Prior Function Prior Level of Function : Independent/Modified Independent;Driving;Working/employed             Mobility Comments: had been using SPC prior to surgery       Extremity/Trunk Assessment   Upper Extremity Assessment Upper Extremity  Assessment: Overall WFL for tasks assessed    Lower Extremity Assessment Lower Extremity Assessment: LLE deficits/detail LLE Deficits / Details: deficits consistent with post op pain and weakness    Cervical / Trunk Assessment Cervical / Trunk Assessment: Normal  Communication   Communication Communication: No apparent difficulties    Cognition Arousal: Alert Behavior During Therapy: WFL for tasks assessed/performed   PT - Cognitive impairments: No apparent impairments                         Following commands: Intact       Cueing       General Comments      Exercises     Assessment/Plan    PT Assessment Patient needs continued PT services  PT Problem List Decreased strength;Decreased activity tolerance;Decreased mobility;Decreased range of motion;Decreased knowledge of use of DME;Decreased safety awareness;Decreased knowledge of precautions       PT Treatment Interventions Gait training;DME instruction;Stair training;Functional mobility training;Therapeutic activities;Therapeutic exercise;Balance training;Neuromuscular re-education;Patient/family education    PT Goals (Current goals can be found in the Care Plan section)  Acute Rehab PT Goals Patient Stated Goal: to get stronger and be able to lift my LLE PT Goal Formulation: With patient Time For Goal Achievement: 10/05/23 Potential to Achieve Goals: Good    Frequency BID     Co-evaluation               AM-PAC PT 6 Clicks Mobility  Outcome Measure Help needed turning from your back to your side while in a flat bed without using bedrails?: A Little Help needed moving from lying on your back to sitting on the side of a flat bed without using bedrails?: A Little Help needed moving to and from a bed to a chair (including a wheelchair)?: A Little Help needed standing up from a chair using your arms (e.g., wheelchair or bedside chair)?: A Little Help needed to walk in hospital room?: A  Little Help needed climbing 3-5 steps with a railing? : A Little 6 Click Score: 18    End of Session   Activity Tolerance: Patient tolerated treatment well Patient left: in bed;with call bell/phone within reach;with family/visitor present Nurse Communication: Mobility status PT Visit Diagnosis: Other abnormalities of gait and mobility (R26.89);Muscle weakness (generalized) (M62.81)    Time: 8545-8485 PT Time Calculation (min) (ACUTE ONLY): 20 min   Charges:   PT Evaluation $PT Eval Moderate Complexity: 1 Mod   PT General Charges $$ ACUTE PT VISIT: 1 Visit         Maryanne Finder, PT, DPT Physical Therapist - [facility]  [facility]   Julissa Browning A Demarko Zeimet 09/21/2023, 3:24 PM

## 2023-09-22 ENCOUNTER — Encounter: Payer: Self-pay | Admitting: Orthopedic Surgery

## 2023-09-22 DIAGNOSIS — M1612 Unilateral primary osteoarthritis, left hip: Secondary | ICD-10-CM | POA: Diagnosis not present

## 2023-09-22 LAB — CBC
HCT: 34.6 % — ABNORMAL LOW (ref 36.0–46.0)
Hemoglobin: 11.4 g/dL — ABNORMAL LOW (ref 12.0–15.0)
MCH: 29.8 pg (ref 26.0–34.0)
MCHC: 32.9 g/dL (ref 30.0–36.0)
MCV: 90.3 fL (ref 80.0–100.0)
Platelets: 272 K/uL (ref 150–400)
RBC: 3.83 MIL/uL — ABNORMAL LOW (ref 3.87–5.11)
RDW: 12.6 % (ref 11.5–15.5)
WBC: 12.3 K/uL — ABNORMAL HIGH (ref 4.0–10.5)
nRBC: 0 % (ref 0.0–0.2)

## 2023-09-22 LAB — BASIC METABOLIC PANEL WITH GFR
Anion gap: 8 (ref 5–15)
BUN: 14 mg/dL (ref 6–20)
CO2: 27 mmol/L (ref 22–32)
Calcium: 8.8 mg/dL — ABNORMAL LOW (ref 8.9–10.3)
Chloride: 102 mmol/L (ref 98–111)
Creatinine, Ser: 0.66 mg/dL (ref 0.44–1.00)
GFR, Estimated: >60 mL/min (ref >60)
Glucose, Bld: 100 mg/dL — ABNORMAL HIGH (ref 70–99)
Potassium: 4.3 mmol/L (ref 3.5–5.1)
Sodium: 137 mmol/L (ref 135–145)

## 2023-09-22 MED ORDER — METHOCARBAMOL 500 MG PO TABS
500.0000 mg | ORAL_TABLET | Freq: Four times a day (QID) | ORAL | 0 refills | Status: DC | PRN
Start: 2023-09-22 — End: 2023-11-28

## 2023-09-22 NOTE — Anesthesia Postprocedure Evaluation (Signed)
 Anesthesia Post Note  Patient: Debbie Sosa  Procedure(s) Performed: ARTHROPLASTY, HIP, TOTAL, ANTERIOR APPROACH (Left: Hip)  Patient location during evaluation: Nursing Unit Anesthesia Type: Spinal Level of consciousness: oriented and awake and alert Pain management: pain level controlled Vital Signs Assessment: post-procedure vital signs reviewed and stable Respiratory status: spontaneous breathing and respiratory function stable Cardiovascular status: blood pressure returned to baseline and stable Postop Assessment: no headache, no backache, no apparent nausea or vomiting and patient able to bend at knees Anesthetic complications: no   No notable events documented.   Last Vitals:  Vitals:   09/21/23 2220 09/22/23 0346  BP: 126/80 117/71  Pulse: 69 83  Resp: 16 18  Temp:  (!) 36.1 C  SpO2: 98% 95%    Last Pain:  Vitals:   09/22/23 0446  TempSrc:   PainSc: 7                  Leontine Morene SQUIBB

## 2023-09-22 NOTE — Progress Notes (Signed)
 Patient is not able to walk the distance required to go the bathroom, or she is unable to safely negotiate stairs required to access the bathroom.  A 3in1 BSC will alleviate this problem.       Lollie Marrow, PA-C [facility]

## 2023-09-22 NOTE — Progress Notes (Cosign Needed)
 Patient is not able to walk the distance required to go the bathroom, or he/she is unable to safely negotiate stairs required to access the bathroom.  A 3in1 BSC will alleviate this problem

## 2023-09-22 NOTE — Progress Notes (Signed)
 Physical Therapy Treatment Patient Details Name: Debbie Sosa MRN: [id number] DOB: Jun 16, 1975 Today's Date: 09/22/2023   History of Present Illness 48 y/o female s/p L THA on 09/21/23. PMH: sleep apnea, anemia, RA    PT Comments  Patient progressing towards physical therapy goals. Patient continues to demonstrate very slow cadence with ambulation with RW. Overall requires supervision for safety. Able to negotiate 4 stairs with B rails and supervision with instruction for up with good LE and down with surgical LE Educated and instructed patient on HEP handout with good return demonstration. Discharge plan remains appropriate.     If plan is discharge home, recommend the following: A little help with walking and/or transfers;A little help with bathing/dressing/bathroom;Assistance with cooking/housework;Assist for transportation;Help with stairs or ramp for entrance   Can travel by private vehicle        Equipment Recommendations  Rolling Mesa Janus (2 wheels)    Recommendations for Other Services       Precautions / Restrictions Precautions Precautions: Fall Recall of Precautions/Restrictions: Intact Restrictions Weight Bearing Restrictions Per Provider Order: Yes LLE Weight Bearing Per Provider Order: Weight bearing as tolerated     Mobility  Bed Mobility Overal bed mobility: Needs Assistance Bed Mobility: Supine to Sit     Supine to sit: Supervision     General bed mobility comments: use of R foot to assist LLE off bed. Use of bed rails and increased time    Transfers Overall transfer level: Needs assistance Equipment used: Rolling Randilyn Foisy (2 wheels) Transfers: Sit to/from Stand Sit to Stand: Supervision                Ambulation/Gait Ambulation/Gait assistance: Supervision Gait Distance (Feet): 150 Feet Assistive device: Rolling Alaa Eyerman (2 wheels) Gait Pattern/deviations: Step-to pattern, Decreased stride length, Decreased stance time - left Gait velocity:  decreased     General Gait Details: supervision for safety. Cues for step through pattern. Very slow cadence   Stairs Stairs: Yes Stairs assistance: Supervision Stair Management: Two rails, Step to pattern, Forwards Number of Stairs: 4     Wheelchair Mobility     Tilt Bed    Modified Rankin (Stroke Patients Only)       Balance Overall balance assessment: Mild deficits observed, not formally tested                                          Communication Communication Communication: No apparent difficulties  Cognition Arousal: Alert Behavior During Therapy: WFL for tasks assessed/performed   PT - Cognitive impairments: No apparent impairments                         Following commands: Intact      Cueing    Exercises      General Comments        Pertinent Vitals/Pain Pain Assessment Pain Assessment: Faces Faces Pain Scale: Hurts even more Pain Location: L hip Pain Descriptors / Indicators: Discomfort, Sore Pain Intervention(s): Limited activity within patient's tolerance, Monitored during session, Repositioned    Home Living                          Prior Function            PT Goals (current goals can now be found in the care plan section) Acute Rehab PT Goals  PT Goal Formulation: With patient Time For Goal Achievement: 10/05/23 Potential to Achieve Goals: Good Progress towards PT goals: Progressing toward goals    Frequency    BID      PT Plan      Co-evaluation              AM-PAC PT 6 Clicks Mobility   Outcome Measure  Help needed turning from your back to your side while in a flat bed without using bedrails?: A Little Help needed moving from lying on your back to sitting on the side of a flat bed without using bedrails?: A Little Help needed moving to and from a bed to a chair (including a wheelchair)?: A Little Help needed standing up from a chair using your arms (e.g., wheelchair  or bedside chair)?: A Little Help needed to walk in hospital room?: A Little Help needed climbing 3-5 steps with a railing? : A Little 6 Click Score: 18    End of Session   Activity Tolerance: Patient tolerated treatment well Patient left: in chair;with call bell/phone within reach Nurse Communication: Mobility status PT Visit Diagnosis: Other abnormalities of gait and mobility (R26.89);Muscle weakness (generalized) (M62.81)     Time: 9144-9061 PT Time Calculation (min) (ACUTE ONLY): 43 min  Charges:    $Therapeutic Exercise: 8-22 mins $Therapeutic Activity: 23-37 mins PT General Charges $$ ACUTE PT VISIT: 1 Visit                     Maryanne Finder, PT, DPT Physical Therapist - [facility]  [facility]   Micky Overturf A Esiah Bazinet 09/22/2023, 10:22 AM

## 2023-09-22 NOTE — Progress Notes (Signed)
   Subjective: 1 Day Post-Op Procedure(s) (LRB): ARTHROPLASTY, HIP, TOTAL, ANTERIOR APPROACH (Left) Patient reports pain as mild.   Patient is well, and has had no acute complaints or problems Denies any CP, SOB, ABD pain. We will continue therapy today.  Plan is to go Home after hospital stay.  Objective: Vital signs in last 24 hours: Temp:  [97 F (36.1 C)-99 F (37.2 C)] 97 F (36.1 C) (09/05 0346) Pulse Rate:  [69-103] 83 (09/05 0346) Resp:  [15-23] 18 (09/05 0346) BP: (91-126)/(64-80) 117/71 (09/05 0346) SpO2:  [95 %-100 %] 95 % (09/05 0346)  Intake/Output from previous day: 09/04 0701 - 09/05 0700 In: 1597.8 [P.O.:240; I.V.:857.8; IV Piggyback:500] Out: 400 [Urine:250; Blood:150] Intake/Output this shift: No intake/output data recorded.  Recent Labs    09/22/23 0639  HGB 11.4*   Recent Labs    09/22/23 0639  WBC 12.3*  RBC 3.83*  HCT 34.6*  PLT 272   Recent Labs    09/22/23 0639  NA 137  K 4.3  CL 102  CO2 27  BUN 14  CREATININE 0.66  GLUCOSE 100*  CALCIUM 8.8*   No results for input(s): LABPT, INR in the last 72 hours.  EXAM General - Patient is Alert, Appropriate, and Oriented Extremity - Neurovascular intact Sensation intact distally Intact pulses distally Dorsiflexion/Plantar flexion intact Dressing - dressing C/D/I and no drainage Motor Function - intact, moving foot and toes well on exam.   Past Medical History:  Diagnosis Date   Anemia    Antiphospholipid antibody positive    Arthritis    Asthma    seasonal   Chronic venous insufficiency    COVID-19 2020   DDD (degenerative disc disease), lumbar    GERD (gastroesophageal reflux disease)    Headache    h/o migraines   History of hiatal hernia    repaired with Gastric bypass   History of kidney stones    Iron  deficiency    recieves iron  infusions every 3 months [facility] the cancer [facility]   Lymphedema    bil legs-sees [place] [facility]   Nausea and vomiting 10/14/2015   Obesity     Pneumonia    Pulmonary embolism (HCC)    after 2nd c-section   Sleep apnea    had gastric bypass and does not need to use cpap since surgery   Vitamin B12 deficiency     Assessment/Plan:   1 Day Post-Op Procedure(s) (LRB): ARTHROPLASTY, HIP, TOTAL, ANTERIOR APPROACH (Left) Principal Problem:   S/P total left hip arthroplasty  Estimated body mass index is 37.49 kg/m as calculated from the following:   Height as of this encounter: 5' 1 (1.549 m).   Weight as of this encounter: 90 kg. Advance diet Up with therapy Pain well controlled  Labs and VSS CM to assist with discharge to home with HHPT today pending good progress with PT  DVT Prophylaxis - Lovenox , TED hose, and SCDs Weight-Bearing as tolerated to left leg   T. Medford Amber, PA-C [facility] 09/22/2023, 7:59 AM

## 2023-09-22 NOTE — TOC Transition Note (Signed)
 Transition of Care [facility]) - Discharge Note   Patient Details  Name: Debbie Sosa MRN: [id number] Date of Birth: 27-Mar-1975  Transition of Care [facility]) CM/SW Contact:  Racheal LITTIE Schimke, RN Phone Number: 09/22/2023, 1:12 PM   Clinical Narrative:  To discharge home with [facility]. 3 IN 1 ordered and delivered by [employer]. Patient declines RW, says she has one at home. Patient says Mother will transport home.    Final next level of care: Home w Home Health Services Barriers to Discharge: Barriers Resolved   Patient Goals and CMS Choice Patient states their goals for this hospitalization and ongoing recovery are:: Home with Home Health PT          Discharge Placement                    Patient and family notified of of transfer: 09/22/23  Discharge Plan and Services Additional resources added to the After Visit Summary for                  DME Arranged: 3-N-1 DME Agency: [facility] Date DME Agency Contacted: 09/22/23   Representative spoke with at DME Agency: Selinda HH Arranged: PT HH Agency: [facility] Date HH Agency Contacted: 09/22/23   Representative spoke with at [facility] Agency: Shaun  Social Drivers of Health (SDOH) Interventions SDOH Screenings   Food Insecurity: Food Insecurity Present (09/21/2023)  Housing: High Risk (09/21/2023)  Transportation Needs: No Transportation Needs (09/21/2023)  Utilities: Not At Risk (09/21/2023)  Depression (PHQ2-9): Low Risk  (04/15/2021)  Financial Resource Strain: Medium Risk (06/01/2023)   Received from [facility]  Social Connections: Socially Isolated (08/22/2023)  Tobacco Use: Low Risk  (09/21/2023)     Readmission Risk Interventions     No data to display

## 2023-09-22 NOTE — Plan of Care (Signed)
 Patient was not able to ambulate into the restroom due to pain.  Previously, patient had been able to move into the restroom with steady gait on walker.  Although mobility was extremely slow, she was able to make it into the restroom. At present, patient states she is unable to walk because it just hurts too much. Patient assist x 2 on bedside commode. Will continue to assess mobility.

## 2023-09-22 NOTE — Progress Notes (Signed)
 Patient meets all requirements to be d/c home safely. Passed PT, tolerated po intake, voiding with ambulation (one assist). Instructions given to patient and mother. Lovenox  sharp box sent home with patient along with verbal instructions. All questions answered to patient and family satisfaction.

## 2023-09-26 ENCOUNTER — Encounter: Payer: Self-pay | Admitting: Internal Medicine

## 2023-10-20 ENCOUNTER — Other Ambulatory Visit (INDEPENDENT_AMBULATORY_CARE_PROVIDER_SITE_OTHER): Payer: Self-pay | Admitting: Vascular Surgery

## 2023-10-20 DIAGNOSIS — Z95828 Presence of other vascular implants and grafts: Secondary | ICD-10-CM

## 2023-10-21 DIAGNOSIS — Z86711 Personal history of pulmonary embolism: Secondary | ICD-10-CM | POA: Insufficient documentation

## 2023-10-21 NOTE — Progress Notes (Unsigned)
 MRN : [id number]  Debbie Sosa is a 48 y.o. (Dec 03, 1975) female who presents with chief complaint of legs hurt and swell.  History of Present Illness:   The patient presents to the office for follow-up status post IVC filter placement prior to joint replacement surgery.  The patient has a history of DVT and therefore was at high risk for recurrence in the perioperative timeframe after undergoing a total joint replacement.    Left hip surgery was on 09/21/2023.  The patient has been doing well with rehab and their mobility is increasing.  The patient notes the affected leg is not increasingly swollen or painful.  No signs and symptoms consistent with recurrence of DVT in the lower extremity.  Patient does note that as a baseline the affected extremity tends to swell with prolonged dependence, symptoms are much better with elevation.  The patient notes minimal edema in the morning which steadily worsens throughout the day.  The patient has not been using compression therapy at this point.  The patient is not anticoagulation.  No SOB or pleuritic chest pains.  No cough or hemoptysis.  No blood per rectum or blood in any sputum.  No excessive bruising per the patient.   No recent shortening of the patient's walking distance or new symptoms consistent with claudication.  No history of rest pain symptoms. No new ulcers or wounds of the lower extremities have occurred.  The patient denies amaurosis fugax or recent TIA symptoms. There are no recent neurological changes noted. No recent episodes of angina or shortness of breath documented.   No outpatient medications have been marked as taking for the 10/23/23 encounter (Appointment) with Jama, Cordella MATSU, MD.    Past Medical History:  Diagnosis Date   Anemia    Antiphospholipid antibody positive    Arthritis    Asthma    seasonal   Chronic venous insufficiency    COVID-19 2020   DDD (degenerative disc disease), lumbar     GERD (gastroesophageal reflux disease)    Headache    h/o migraines   History of hiatal hernia    repaired with Gastric bypass   History of kidney stones    Iron  deficiency    recieves iron  infusions every 3 months at the cancer center   Lymphedema    bil legs-sees [facility]   Nausea and vomiting 10/14/2015   Obesity    Pneumonia    Pulmonary embolism (HCC)    after 2nd c-section   Sleep apnea    had gastric bypass and does not need to use cpap since surgery   Vitamin B12 deficiency     Past Surgical History:  Procedure Laterality Date   BREAST BIOPSY Left 2006   benign/clip   BREAST BIOPSY Right 2015   benign/clip   CESAREAN SECTION     CESAREAN SECTION     CHOLECYSTECTOMY     COLONOSCOPY     COLONOSCOPY WITH PROPOFOL  N/A 10/31/2018   Procedure: COLONOSCOPY WITH PROPOFOL ;  Surgeon: Therisa Bi, MD;  Location: [facility] ENDOSCOPY;  Service: Gastroenterology;  Laterality: N/A;   ESOPHAGOGASTRODUODENOSCOPY (EGD) WITH PROPOFOL  N/A 10/04/2016   Procedure: ESOPHAGOGASTRODUODENOSCOPY (EGD) WITH PROPOFOL ;  Surgeon: Therisa Bi, MD;  Location: [facility] ENDOSCOPY;  Service: Gastroenterology;  Laterality: N/A;   ESOPHAGOGASTRODUODENOSCOPY (EGD) WITH PROPOFOL  N/A 04/14/2017   Procedure: ESOPHAGOGASTRODUODENOSCOPY (EGD) WITH PROPOFOL ;  Surgeon: Therisa Bi, MD;  Location: [facility] ENDOSCOPY;  Service: Gastroenterology;  Laterality: N/A;   ESOPHAGOGASTRODUODENOSCOPY (EGD)  WITH PROPOFOL  N/A 10/31/2018   Procedure: ESOPHAGOGASTRODUODENOSCOPY (EGD) WITH PROPOFOL ;  Surgeon: Therisa Bi, MD;  Location: [facility] ENDOSCOPY;  Service: Gastroenterology;  Laterality: N/A;   ESOPHAGOGASTRODUODENOSCOPY (EGD) WITH PROPOFOL  N/A 06/28/2019   Procedure: ESOPHAGOGASTRODUODENOSCOPY (EGD) WITH PROPOFOL ;  Surgeon: Therisa Bi, MD;  Location: [facility] ENDOSCOPY;  Service: Gastroenterology;  Laterality: N/A;   ESOPHAGOGASTRODUODENOSCOPY (EGD) WITH PROPOFOL  N/A 02/10/2021   Procedure: ESOPHAGOGASTRODUODENOSCOPY (EGD) WITH  PROPOFOL ;  Surgeon: Therisa Bi, MD;  Location: [facility] ENDOSCOPY;  Service: Gastroenterology;  Laterality: N/A;   GASTRIC BYPASS     01/2015   HERNIA REPAIR     repaired with gastric bypass - Hiatel hernia   HYSTEROSCOPY WITH D & C N/A 09/17/2021   Procedure: DILATATION AND CURETTAGE /HYSTEROSCOPY;  Surgeon: Leonce Garnette BIRCH, MD;  Location: [facility] ORS;  Service: Gynecology;  Laterality: N/A;   INTRAUTERINE DEVICE (IUD) INSERTION N/A 09/17/2021   Procedure: INTRAUTERINE DEVICE (IUD) INSERTION - MIRENA ;  Surgeon: Leonce Garnette BIRCH, MD;  Location: [facility] ORS;  Service: Gynecology;  Laterality: N/A;   IUD REMOVAL N/A 09/17/2021   Procedure: INTRAUTERINE DEVICE (IUD) REMOVAL;  Surgeon: Leonce Garnette BIRCH, MD;  Location: [facility] ORS;  Service: Gynecology;  Laterality: N/A;   IVC FILTER INSERTION N/A 09/19/2023   Procedure: IVC FILTER INSERTION;  Surgeon: Jama Cordella MATSU, MD;  Location: [facility] INVASIVE CV LAB;  Service: Cardiovascular;  Laterality: N/A;   KNEE SURGERY Right    NASAL SINUS SURGERY     PLANTAR FASCIA SURGERY     x 3   REDUCTION MAMMAPLASTY Bilateral 2005   TOTAL HIP ARTHROPLASTY Left 09/21/2023   Procedure: ARTHROPLASTY, HIP, TOTAL, ANTERIOR APPROACH;  Surgeon: Lorelle Hussar, MD;  Location: [facility] ORS;  Service: Orthopedics;  Laterality: Left;    Social History Social History   Tobacco Use   Smoking status: Never   Smokeless tobacco: Never  Vaping Use   Vaping status: Never Used  Substance Use Topics   Alcohol use: No   Drug use: No    Family History Family History  Problem Relation Age of Onset   Breast cancer Mother 32   Breast cancer Cousin 30   Breast cancer Cousin 54   Asthma Father    Asthma Sister    Asthma Child     Allergies  Allergen Reactions   Amoxicillin  Anaphylaxis    Throat swelling   Avocado Anaphylaxis   Cephalexin  Hives and Rash   Fexofenadine Shortness Of Breath   Iodinated Contrast Media Hives   Ketorolac  Hives   Morphine Anaphylaxis    Penicillins Swelling    Throat swelling    Promethazine Hives   Diflucan  [Fluconazole ] Rash   Cetirizine Other (See Comments)    Skin eruptures Patient denies   Diclofenac  Sodium Hives   Doxycycline Other (See Comments)   Latex     With condoms   Other Hives    Pain medicine that starts with an L   Tramadol  Hives    Can take with benadryl    Hepatitis B Virus Vaccines Rash   Misc. Sulfonamide Containing Compounds Rash   Morphine And Codeine Rash   Naproxen Rash   Oxycodone -Acetaminophen  Itching   Pyridium [Phenazopyridine Hcl] Rash   Sulfa Antibiotics Rash   Sulfasalazine Rash   Vioxx [Rofecoxib] Rash     REVIEW OF SYSTEMS (Negative unless checked)  Constitutional: [] Weight loss  [] Fever  [] Chills Cardiac: [] Chest pain   [] Chest pressure   [] Palpitations   [] Shortness of breath when laying flat   [] Shortness of breath with  exertion. Vascular:  [] Pain in legs with walking   [x] Pain in legs at rest  [] History of DVT   [] Phlebitis   [x] Swelling in legs   [] Varicose veins   [] Non-healing ulcers Pulmonary:   [] Uses home oxygen   [] Productive cough   [] Hemoptysis   [] Wheeze  [] COPD   [] Asthma Neurologic:  [] Dizziness   [] Seizures   [] History of stroke   [] History of TIA  [] Aphasia   [] Vissual changes   [] Weakness or numbness in arm   [] Weakness or numbness in leg Musculoskeletal:   [] Joint swelling   [] Joint pain   [] Low back pain Hematologic:  [] Easy bruising  [] Easy bleeding   [] Hypercoagulable state   [] Anemic Gastrointestinal:  [] Diarrhea   [] Vomiting  [] Gastroesophageal reflux/heartburn   [] Difficulty swallowing. Genitourinary:  [] Chronic kidney disease   [] Difficult urination  [] Frequent urination   [] Blood in urine Skin:  [] Rashes   [] Ulcers  Psychological:  [] History of anxiety   []  History of major depression.  Physical Examination  There were no vitals filed for this visit. There is no height or weight on file to calculate BMI. Gen: WD/WN, NAD Head: [place]/AT, No  temporalis wasting.  Ear/Nose/Throat: Hearing grossly intact, nares w/o erythema or drainage, pinna without lesions Eyes: PER, EOMI, sclera nonicteric.  Neck: Supple, no gross masses.  No JVD.  Pulmonary:  Good air movement, no audible wheezing, no use of accessory muscles.  Cardiac: RRR, precordium not hyperdynamic. Vascular:  scattered varicosities present bilaterally.  Moderate venous stasis changes to the legs bilaterally.  2+ soft pitting edema. CEAP [id number]   Vessel Right Left  Radial Palpable Palpable  Gastrointestinal: soft, non-distended. No guarding/no peritoneal signs.  Musculoskeletal: M/S 5/5 throughout.  No deformity.  Neurologic: CN 2-12 intact. Pain and light touch intact in extremities.  Symmetrical.  Speech is fluent. Motor exam as listed above. Psychiatric: Judgment intact, Mood & affect appropriate for pt's clinical situation. Dermatologic: Venous rashes no ulcers noted.  No changes consistent with cellulitis. Lymph : No lichenification or skin changes of chronic lymphedema.  CBC Lab Results  Component Value Date   WBC 12.3 (H) 09/22/2023   HGB 11.4 (L) 09/22/2023   HCT 34.6 (L) 09/22/2023   MCV 90.3 09/22/2023   PLT 272 09/22/2023    BMET    Component Value Date/Time   NA 137 09/22/2023 0639   NA 140 08/23/2013 1720   K 4.3 09/22/2023 0639   K 3.7 08/23/2013 1720   CL 102 09/22/2023 0639   CL 105 08/23/2013 1720   CO2 27 09/22/2023 0639   CO2 25 08/23/2013 1720   GLUCOSE 100 (H) 09/22/2023 0639   GLUCOSE 86 08/23/2013 1720   BUN 14 09/22/2023 0639   BUN 7 08/23/2013 1720   CREATININE 0.66 09/22/2023 0639   CREATININE 0.77 03/07/2023 1522   CREATININE 0.84 08/23/2013 1720   CALCIUM 8.8 (L) 09/22/2023 0639   CALCIUM 8.6 08/23/2013 1720   GFRNONAA >60 09/22/2023 0639   GFRNONAA >60 03/07/2023 1522   GFRNONAA >60 08/23/2013 1720   GFRAA >60 01/17/2019 0604   GFRAA >60 08/23/2013 1720   CrCl cannot be calculated (Patient's most recent lab result  is older than the maximum 21 days allowed.).  COAG Lab Results  Component Value Date   INR 1.0 05/11/2018    Radiology No results found.   Assessment/Plan There are no diagnoses linked to this encounter.   Cordella Shawl, MD  10/21/2023 12:14 PM

## 2023-10-23 ENCOUNTER — Ambulatory Visit (INDEPENDENT_AMBULATORY_CARE_PROVIDER_SITE_OTHER)

## 2023-10-23 ENCOUNTER — Ambulatory Visit (INDEPENDENT_AMBULATORY_CARE_PROVIDER_SITE_OTHER): Admitting: Vascular Surgery

## 2023-10-23 ENCOUNTER — Encounter (INDEPENDENT_AMBULATORY_CARE_PROVIDER_SITE_OTHER): Payer: Self-pay | Admitting: Vascular Surgery

## 2023-10-23 VITALS — BP 114/81 | HR 79 | Resp 18 | Ht 61.0 in | Wt 202.6 lb

## 2023-10-23 DIAGNOSIS — M5416 Radiculopathy, lumbar region: Secondary | ICD-10-CM | POA: Diagnosis not present

## 2023-10-23 DIAGNOSIS — Z86711 Personal history of pulmonary embolism: Secondary | ICD-10-CM

## 2023-10-23 DIAGNOSIS — Z95828 Presence of other vascular implants and grafts: Secondary | ICD-10-CM

## 2023-10-23 DIAGNOSIS — Z96642 Presence of left artificial hip joint: Secondary | ICD-10-CM

## 2023-10-23 DIAGNOSIS — I890 Lymphedema, not elsewhere classified: Secondary | ICD-10-CM

## 2023-10-24 ENCOUNTER — Telehealth (INDEPENDENT_AMBULATORY_CARE_PROVIDER_SITE_OTHER): Payer: Self-pay

## 2023-10-24 NOTE — Telephone Encounter (Signed)
 Spoke with the patient and she is scheduled with Dr. Jama on 11/07/23 with a 12:30 pm arrival time for a IVC filter removal at the [facility]. Pre-procedure instructions were discussed and will be sent to Mychart and mailed.

## 2023-10-26 ENCOUNTER — Encounter (INDEPENDENT_AMBULATORY_CARE_PROVIDER_SITE_OTHER): Payer: Self-pay | Admitting: Vascular Surgery

## 2023-11-07 ENCOUNTER — Other Ambulatory Visit: Payer: Self-pay

## 2023-11-07 ENCOUNTER — Encounter: Payer: Self-pay | Admitting: Vascular Surgery

## 2023-11-07 ENCOUNTER — Ambulatory Visit
Admission: RE | Admit: 2023-11-07 | Discharge: 2023-11-07 | Disposition: A | Discharge status: Home / Self Care | Attending: Vascular Surgery | Admitting: Vascular Surgery

## 2023-11-07 ENCOUNTER — Encounter
Admission: RE | Disposition: A | Discharge status: Home / Self Care | Payer: Self-pay | Source: Home / Self Care | Attending: Vascular Surgery

## 2023-11-07 DIAGNOSIS — Z9889 Other specified postprocedural states: Secondary | ICD-10-CM

## 2023-11-07 DIAGNOSIS — Z86711 Personal history of pulmonary embolism: Secondary | ICD-10-CM

## 2023-11-07 DIAGNOSIS — I872 Venous insufficiency (chronic) (peripheral): Secondary | ICD-10-CM | POA: Insufficient documentation

## 2023-11-07 DIAGNOSIS — I890 Lymphedema, not elsewhere classified: Secondary | ICD-10-CM | POA: Diagnosis not present

## 2023-11-07 DIAGNOSIS — Z96642 Presence of left artificial hip joint: Secondary | ICD-10-CM | POA: Insufficient documentation

## 2023-11-07 DIAGNOSIS — Z79899 Other long term (current) drug therapy: Secondary | ICD-10-CM | POA: Insufficient documentation

## 2023-11-07 DIAGNOSIS — M5416 Radiculopathy, lumbar region: Secondary | ICD-10-CM | POA: Insufficient documentation

## 2023-11-07 DIAGNOSIS — Z4589 Encounter for adjustment and management of other implanted devices: Secondary | ICD-10-CM | POA: Insufficient documentation

## 2023-11-07 DIAGNOSIS — Z86718 Personal history of other venous thrombosis and embolism: Secondary | ICD-10-CM | POA: Diagnosis not present

## 2023-11-07 DIAGNOSIS — I82409 Acute embolism and thrombosis of unspecified deep veins of unspecified lower extremity: Secondary | ICD-10-CM

## 2023-11-07 HISTORY — PX: IVC FILTER REMOVAL: CATH118246

## 2023-11-07 SURGERY — IVC FILTER REMOVAL
Anesthesia: Moderate Sedation

## 2023-11-07 MED ORDER — METHYLPREDNISOLONE SODIUM SUCC 125 MG IJ SOLR
INTRAMUSCULAR | Status: AC
  Administered 2023-11-07: 125 mg via INTRAVENOUS
  Filled 2023-11-07: qty 2

## 2023-11-07 MED ORDER — ONDANSETRON HCL 4 MG/2ML IJ SOLN: 4.0000 mg | Freq: Four times a day (QID) | INTRAMUSCULAR | Status: DC | PRN

## 2023-11-07 MED ORDER — MIDAZOLAM HCL 2 MG/2ML IJ SOLN
INTRAMUSCULAR | Status: AC
  Filled 2023-11-07: qty 2

## 2023-11-07 MED ORDER — HYDROMORPHONE HCL 1 MG/ML IJ SOLN: 1.0000 mg | Freq: Once | INTRAMUSCULAR | Status: DC | PRN

## 2023-11-07 MED ORDER — DIPHENHYDRAMINE HCL 50 MG/ML IJ SOLN
INTRAMUSCULAR | Status: AC
  Administered 2023-11-07: 50 mg via INTRAVENOUS
  Filled 2023-11-07: qty 1

## 2023-11-07 MED ORDER — FAMOTIDINE 20 MG PO TABS
ORAL_TABLET | ORAL | Status: AC
  Administered 2023-11-07: 40 mg via ORAL
  Filled 2023-11-07: qty 2

## 2023-11-07 MED ORDER — FENTANYL CITRATE (PF) 100 MCG/2ML IJ SOLN
INTRAMUSCULAR | Status: DC | PRN
  Administered 2023-11-07: 25 ug via INTRAVENOUS
  Administered 2023-11-07: 50 ug via INTRAVENOUS

## 2023-11-07 MED ORDER — MIDAZOLAM HCL (PF) 2 MG/2ML IJ SOLN
INTRAMUSCULAR | Status: DC | PRN
  Administered 2023-11-07: .5 mg via INTRAVENOUS
  Administered 2023-11-07: 2 mg via INTRAVENOUS

## 2023-11-07 MED ORDER — METHYLPREDNISOLONE SODIUM SUCC 125 MG IJ SOLR: 125.0000 mg | Freq: Once | INTRAMUSCULAR | Status: AC | PRN

## 2023-11-07 MED ORDER — LIDOCAINE HCL (PF) 1 % IJ SOLN
INTRAMUSCULAR | Status: DC | PRN
Start: 2023-11-07 — End: 2023-11-07
  Administered 2023-11-07: 10 mL

## 2023-11-07 MED ORDER — FENTANYL CITRATE (PF) 100 MCG/2ML IJ SOLN
INTRAMUSCULAR | Status: AC
  Filled 2023-11-07: qty 2

## 2023-11-07 MED ORDER — DIPHENHYDRAMINE HCL 50 MG/ML IJ SOLN: 50.0000 mg | Freq: Once | INTRAMUSCULAR | Status: AC | PRN

## 2023-11-07 MED ORDER — HEPARIN (PORCINE) IN NACL 1000-0.9 UT/500ML-% IV SOLN
INTRAVENOUS | Status: DC | PRN
  Administered 2023-11-07: 500 mL

## 2023-11-07 MED ORDER — SODIUM CHLORIDE 0.9 % IV SOLN: INTRAVENOUS | Status: DC

## 2023-11-07 MED ORDER — MIDAZOLAM HCL 2 MG/ML PO SYRP: 8.0000 mg | ORAL_SOLUTION | Freq: Once | ORAL | Status: DC | PRN

## 2023-11-07 MED ORDER — IODIXANOL 320 MG/ML IV SOLN
INTRAVENOUS | Status: DC | PRN
  Administered 2023-11-07: 15 mL

## 2023-11-07 MED ORDER — FAMOTIDINE 20 MG PO TABS: 40.0000 mg | ORAL_TABLET | Freq: Once | ORAL | Status: AC | PRN

## 2023-11-07 MED ORDER — VANCOMYCIN HCL IN DEXTROSE 1-5 GM/200ML-% IV SOLN
1000.0000 mg | INTRAVENOUS | Status: AC
  Administered 2023-11-07: 1000 mg via INTRAVENOUS

## 2023-11-07 MED ORDER — VANCOMYCIN HCL IN DEXTROSE 1-5 GM/200ML-% IV SOLN
INTRAVENOUS | Status: AC
  Filled 2023-11-07: qty 200

## 2023-11-07 SURGICAL SUPPLY — 7 items
COVER PROBE ULTRASOUND 5X96 (MISCELLANEOUS) IMPLANT
KIT SNARE RETRIEVAL 3-LOOP (MISCELLANEOUS) IMPLANT
NDL ENTRY 21GA 7CM ECHOTIP (NEEDLE) IMPLANT
NEEDLE ENTRY 21GA 7CM ECHOTIP (NEEDLE) ×1 IMPLANT
PACK ANGIOGRAPHY (CUSTOM PROCEDURE TRAY) ×1 IMPLANT
SET INTRO CAPELLA COAXIAL (SET/KITS/TRAYS/PACK) IMPLANT
WIRE J 3MM .035X145CM (WIRE) IMPLANT

## 2023-11-07 NOTE — Op Note (Signed)
  OPERATIVE NOTE   PRE-OPERATIVE DIAGNOSIS:  History of DVT with PE Status post recent joint replacement surgery  POST-OPERATIVE DIAGNOSIS: Same  PROCEDURE: Retrieval of IVC Filter Inferior Vena Cavagram  SURGEON: Cordella JUDITHANN Sosa, M.D.  ANESTHESIA:  Conscious sedation was administered under my direct supervision by the interventional radiology RN. IV Versed  plus fentanyl  were utilized. Continuous ECG, pulse oximetry and blood pressure was monitored throughout the entire procedure. Conscious sedation was for a total of 19 minutes.  ESTIMATED BLOOD LOSS: Minimal cc  FINDING(S):inferior vena cava is widely patent filter is in place in good position. Filter is removed without incident  SPECIMEN(S):  IVC filter intact.  Fluoroscopy time: 2.1 minutes  Contrast: 15 cc  INDICATIONS:   Debbie Sosa is a 48 y.o. female who presents with DVT and PE. The patient has now tolerated anticoagulation for several months. Therefore, the IVC filter is recommended to be removed. The risks and benefits were reviewed with the patient all questions were answered and they agreed to proceed with IVC filter retrieval. Oral anticoagulation will be continued.  DESCRIPTION: After obtaining full informed written consent, the patient was brought back to the Special Procedure Suite and placed in the supine position.  The patient received IV antibiotics prior to induction.  After obtaining adequate sedation, the patient was prepped and draped in the standard fashion and appropriate time out is called.     Ultrasound was placed in a sterile sleeve.The right neck was then imaged with ultrasound.   Jugular vein was identified it is echolucent and homogeneous indicating patency. 1% lidocaine  is then infiltrated under ultrasound visualization and subsequently a Seldinger needle is inserted under real-time ultrasound guidance.  J-wire is then advanced into the inferior vena cava under fluoroscopic guidance. With  the tip of the sheath at the confluence of the iliac veins inferior vena caval imaging is performed.  After review of the image the sheath is repositioned to above the filter and the snares introduced. Snares opened and the hook is secured without difficulty. The filter is then collapsed within the sheath and removed without difficulty.  Sheath is removed by pressures held the patient tolerated the procedure well and there were no immediate complications.  Interpretation: inferior vena cava is widely patent filter is in place in good position. Filter is removed without incident.     COMPLICATIONS: None  CONDITION: Debbie Sosa, M.D. [facility] Office: [phone]   11/07/2023, 2:37 PM

## 2023-11-08 ENCOUNTER — Encounter: Payer: Self-pay | Admitting: Vascular Surgery

## 2023-11-09 ENCOUNTER — Other Ambulatory Visit: Payer: Self-pay

## 2023-11-09 DIAGNOSIS — Z1231 Encounter for screening mammogram for malignant neoplasm of breast: Secondary | ICD-10-CM

## 2023-11-14 ENCOUNTER — Encounter: Payer: Self-pay | Admitting: Internal Medicine

## 2023-11-23 ENCOUNTER — Inpatient Hospital Stay: Admitting: Nurse Practitioner

## 2023-11-23 ENCOUNTER — Inpatient Hospital Stay

## 2023-11-24 ENCOUNTER — Inpatient Hospital Stay

## 2023-11-24 ENCOUNTER — Inpatient Hospital Stay: Admitting: Nurse Practitioner

## 2023-11-28 ENCOUNTER — Inpatient Hospital Stay: Attending: Internal Medicine

## 2023-11-28 ENCOUNTER — Inpatient Hospital Stay (HOSPITAL_BASED_OUTPATIENT_CLINIC_OR_DEPARTMENT_OTHER): Admitting: Nurse Practitioner

## 2023-11-28 ENCOUNTER — Other Ambulatory Visit: Payer: Self-pay | Admitting: *Deleted

## 2023-11-28 ENCOUNTER — Inpatient Hospital Stay

## 2023-11-28 VITALS — BP 113/72 | HR 72 | Temp 99.1°F | Wt 207.8 lb

## 2023-11-28 DIAGNOSIS — M069 Rheumatoid arthritis, unspecified: Secondary | ICD-10-CM | POA: Diagnosis not present

## 2023-11-28 DIAGNOSIS — Z8616 Personal history of COVID-19: Secondary | ICD-10-CM | POA: Insufficient documentation

## 2023-11-28 DIAGNOSIS — E538 Deficiency of other specified B group vitamins: Secondary | ICD-10-CM | POA: Insufficient documentation

## 2023-11-28 DIAGNOSIS — Z9884 Bariatric surgery status: Secondary | ICD-10-CM | POA: Insufficient documentation

## 2023-11-28 DIAGNOSIS — E611 Iron deficiency: Secondary | ICD-10-CM

## 2023-11-28 LAB — CBC WITH DIFFERENTIAL (CANCER CENTER ONLY)
Abs Immature Granulocytes: 0.01 K/uL (ref 0.00–0.07)
Basophils Absolute: 0 K/uL (ref 0.0–0.1)
Basophils Relative: 1 %
Eosinophils Absolute: 0.2 K/uL (ref 0.0–0.5)
Eosinophils Relative: 6 %
HCT: 37.2 % (ref 36.0–46.0)
Hemoglobin: 12.3 g/dL (ref 12.0–15.0)
Immature Granulocytes: 0 %
Lymphocytes Relative: 26 %
Lymphs Abs: 1 K/uL (ref 0.7–4.0)
MCH: 30.1 pg (ref 26.0–34.0)
MCHC: 33.1 g/dL (ref 30.0–36.0)
MCV: 91.2 fL (ref 80.0–100.0)
Monocytes Absolute: 0.3 K/uL (ref 0.1–1.0)
Monocytes Relative: 8 %
Neutro Abs: 2.2 K/uL (ref 1.7–7.7)
Neutrophils Relative %: 59 %
Platelet Count: 295 K/uL (ref 150–400)
RBC: 4.08 MIL/uL (ref 3.87–5.11)
RDW: 13.3 % (ref 11.5–15.5)
WBC Count: 3.7 K/uL — ABNORMAL LOW (ref 4.0–10.5)
nRBC: 0 % (ref 0.0–0.2)

## 2023-11-28 LAB — BASIC METABOLIC PANEL - CANCER CENTER ONLY
Anion gap: 7 (ref 5–15)
BUN: 10 mg/dL (ref 6–20)
CO2: 24 mmol/L (ref 22–32)
Calcium: 8.4 mg/dL — ABNORMAL LOW (ref 8.9–10.3)
Chloride: 104 mmol/L (ref 98–111)
Creatinine: 0.64 mg/dL (ref 0.44–1.00)
GFR, Estimated: >60 mL/min (ref >60)
Glucose, Bld: 108 mg/dL — ABNORMAL HIGH (ref 70–99)
Potassium: 3.7 mmol/L (ref 3.5–5.1)
Sodium: 135 mmol/L (ref 135–145)

## 2023-11-28 LAB — IRON AND TIBC
Iron: 77 ug/dL (ref 28–170)
Saturation Ratios: 25 % (ref 10.4–31.8)
TIBC: 308 ug/dL (ref 250–450)
UIBC: 231 ug/dL

## 2023-11-28 LAB — FERRITIN: Ferritin: 89 ng/mL (ref 11–307)

## 2023-11-28 LAB — VITAMIN D 25 HYDROXY (VIT D DEFICIENCY, FRACTURES): Vit D, 25-Hydroxy: 29.36 ng/mL — ABNORMAL LOW (ref 30–100)

## 2023-11-28 LAB — VITAMIN B12: Vitamin B-12: 198 pg/mL (ref 180–914)

## 2023-11-28 NOTE — Progress Notes (Signed)
 [facility] CONSULT NOTE  Patient Care Team: Auston Reyes BIRCH, MD as PCP - General (Internal Medicine) Rennie Cindy SAUNDERS, MD as Consulting Physician (Oncology)  CHIEF COMPLAINTS/PURPOSE OF CONSULTATION: Iron /B12 deficiency  # Iron  deficiency/B12 deficiency- gastric bypass; poor tolerance to p.o. iron .  # 2002- PE [2 weeks post partum] x18 months- coumadin; 2009- SVT- right chest wall- [[facility]]-short-term anticoagulation;  # ANTICRADIOLIPIN ANTIBODY [rheumatology evaluation; Dr.Patel];-08/2019--Pos: Anticardiolipin Ab IgM; Neg: Anticardiolipin Ab IgG, Beta-2Glyco 1 IgA, IgM, IgG, No lupus anticoagulant;   # Rheumatoid arthritis, stage 1; Diagnosed 08/2019; Positive RF; synovitis of the joint; elevated sed rate'l  Continue HCQ 200 mg BID  # Hx of gastric Bypass; hx of COVID infection admitted [Jan 2021]   Oncology History   No history exists.   HISTORY OF PRESENTING ILLNESS: Alone.  Ambulating independently.    Debbie Sosa 48 y.o. female with iron  deficiency/B12 deficiency gastric bypass is here for follow-up.  She had hip surgery and continues to recover.  Now back at work. Denies any neurologic complaints. Denies recent fevers or illnesses. Denies any easy bleeding or bruising. No melena or hematochezia. No pica or restless leg. Reports good appetite and denies weight loss. Denies chest pain. Denies any nausea, vomiting, constipation, or diarrhea. Denies urinary complaints. Patient offers no further specific complaints today.  Review of Systems  Constitutional:  Negative for chills, diaphoresis, fever, malaise/fatigue and weight loss.  HENT:  Negative for nosebleeds and sore throat.   Respiratory:  Negative for cough, hemoptysis, sputum production, shortness of breath and wheezing.   Cardiovascular:  Negative for chest pain, palpitations, orthopnea and leg swelling.  Gastrointestinal:  Negative for abdominal pain, blood in stool, heartburn and melena.   Genitourinary:  Negative for dysuria, frequency and urgency.  Musculoskeletal:  Negative for back pain, falls and joint pain.  Skin:  Negative for itching.  Neurological:  Negative for dizziness, tingling, focal weakness, weakness and headaches.  Endo/Heme/Allergies:  Does not bruise/bleed easily.  Psychiatric/Behavioral:  Negative for depression. The patient is not nervous/anxious and does not have insomnia.      MEDICAL HISTORY:  Past Medical History:  Diagnosis Date   Anemia    Antiphospholipid antibody positive    Arthritis    Asthma    seasonal   Chronic venous insufficiency    COVID-19 2020   DDD (degenerative disc disease), lumbar    GERD (gastroesophageal reflux disease)    Headache    h/o migraines   History of hiatal hernia    repaired with Gastric bypass   History of kidney stones    Iron  deficiency    recieves iron  infusions every 3 months at the cancer center   Lymphedema    bil legs-sees [place] [facility]   Nausea and vomiting 10/14/2015   Obesity    Pneumonia    Pulmonary embolism (HCC)    after 2nd c-section   Sleep apnea    had gastric bypass and does not need to use cpap since surgery   Vitamin B12 deficiency     SURGICAL HISTORY: Past Surgical History:  Procedure Laterality Date   BREAST BIOPSY Left 2006   benign/clip   BREAST BIOPSY Right 2015   benign/clip   CESAREAN SECTION     CESAREAN SECTION     CHOLECYSTECTOMY     COLONOSCOPY     COLONOSCOPY WITH PROPOFOL  N/A 10/31/2018   Procedure: COLONOSCOPY WITH PROPOFOL ;  Surgeon: Therisa Bi, MD;  Location: [facility] ENDOSCOPY;  Service: Gastroenterology;  Laterality: N/A;  ESOPHAGOGASTRODUODENOSCOPY (EGD) WITH PROPOFOL  N/A 10/04/2016   Procedure: ESOPHAGOGASTRODUODENOSCOPY (EGD) WITH PROPOFOL ;  Surgeon: Therisa Bi, MD;  Location: [facility] ENDOSCOPY;  Service: Gastroenterology;  Laterality: N/A;   ESOPHAGOGASTRODUODENOSCOPY (EGD) WITH PROPOFOL  N/A 04/14/2017   Procedure: ESOPHAGOGASTRODUODENOSCOPY (EGD)  WITH PROPOFOL ;  Surgeon: Therisa Bi, MD;  Location: [facility] ENDOSCOPY;  Service: Gastroenterology;  Laterality: N/A;   ESOPHAGOGASTRODUODENOSCOPY (EGD) WITH PROPOFOL  N/A 10/31/2018   Procedure: ESOPHAGOGASTRODUODENOSCOPY (EGD) WITH PROPOFOL ;  Surgeon: Therisa Bi, MD;  Location: [facility] ENDOSCOPY;  Service: Gastroenterology;  Laterality: N/A;   ESOPHAGOGASTRODUODENOSCOPY (EGD) WITH PROPOFOL  N/A 06/28/2019   Procedure: ESOPHAGOGASTRODUODENOSCOPY (EGD) WITH PROPOFOL ;  Surgeon: Therisa Bi, MD;  Location: [facility] ENDOSCOPY;  Service: Gastroenterology;  Laterality: N/A;   ESOPHAGOGASTRODUODENOSCOPY (EGD) WITH PROPOFOL  N/A 02/10/2021   Procedure: ESOPHAGOGASTRODUODENOSCOPY (EGD) WITH PROPOFOL ;  Surgeon: Therisa Bi, MD;  Location: [facility] ENDOSCOPY;  Service: Gastroenterology;  Laterality: N/A;   GASTRIC BYPASS     01/2015   HERNIA REPAIR     repaired with gastric bypass - Hiatel hernia   HYSTEROSCOPY WITH D & C N/A 09/17/2021   Procedure: DILATATION AND CURETTAGE /HYSTEROSCOPY;  Surgeon: Leonce Garnette BIRCH, MD;  Location: [facility] ORS;  Service: Gynecology;  Laterality: N/A;   INTRAUTERINE DEVICE (IUD) INSERTION N/A 09/17/2021   Procedure: INTRAUTERINE DEVICE (IUD) INSERTION - MIRENA ;  Surgeon: Leonce Garnette BIRCH, MD;  Location: [facility] ORS;  Service: Gynecology;  Laterality: N/A;   IUD REMOVAL N/A 09/17/2021   Procedure: INTRAUTERINE DEVICE (IUD) REMOVAL;  Surgeon: Leonce Garnette BIRCH, MD;  Location: [facility] ORS;  Service: Gynecology;  Laterality: N/A;   IVC FILTER INSERTION N/A 09/19/2023   Procedure: IVC FILTER INSERTION;  Surgeon: Jama Cordella MATSU, MD;  Location: [facility] INVASIVE CV LAB;  Service: Cardiovascular;  Laterality: N/A;   IVC FILTER REMOVAL N/A 11/07/2023   Procedure: IVC FILTER REMOVAL;  Surgeon: Jama Cordella MATSU, MD;  Location: [facility] INVASIVE CV LAB;  Service: Cardiovascular;  Laterality: N/A;   KNEE SURGERY Right    NASAL SINUS SURGERY     PLANTAR FASCIA SURGERY     x 3   REDUCTION MAMMAPLASTY  Bilateral 2005   TOTAL HIP ARTHROPLASTY Left 09/21/2023   Procedure: ARTHROPLASTY, HIP, TOTAL, ANTERIOR APPROACH;  Surgeon: Lorelle Hussar, MD;  Location: [facility] ORS;  Service: Orthopedics;  Laterality: Left;    SOCIAL HISTORY: Social History   Socioeconomic History   Marital status: Single    Spouse name: Not on file   Number of children: Not on file   Years of education: Not on file   Highest education level: Not on file  Occupational History   Not on file  Tobacco Use   Smoking status: Never   Smokeless tobacco: Never  Vaping Use   Vaping status: Never Used  Substance and Sexual Activity   Alcohol use: No   Drug use: No   Sexual activity: Not on file  Other Topics Concern   Not on file  Social History Narrative   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    Social Drivers of Health   Financial Resource Strain: Medium Risk (10/02/2023)   Received from [facility]   Overall Financial Resource Strain (CARDIA)    Difficulty of Paying Living Expenses: Somewhat hard  Food Insecurity: Food Insecurity Present (10/02/2023)   Received from [facility]   Hunger Vital Sign    Within the past 12 months, you worried that your food would run out before  you got the money to buy more.: Sometimes true    Within the past 12 months, the food you bought just didn't last and you didn't have money to get more.: Sometimes true  Transportation Needs: No Transportation Needs (10/02/2023)   Received from [facility] - Transportation    In the past 12 months, has lack of transportation kept you from medical appointments or from getting medications?: No    Lack of Transportation (Non-Medical): No  Physical Activity: Not on file  Stress: Not on file  Social Connections: Socially Isolated (08/22/2023)   Social Connection and Isolation Panel    Frequency of  Communication with Friends and Family: More than three times a week    Frequency of Social Gatherings with Friends and Family: More than three times a week    Attends Religious Services: Never    Database Administrator or Organizations: No    Attends Banker Meetings: Never    Marital Status: Never married  Intimate Partner Violence: Not At Risk (09/21/2023)   Humiliation, Afraid, Rape, and Kick questionnaire    Fear of Current or Ex-Partner: No    Emotionally Abused: No    Physically Abused: No    Sexually Abused: No    FAMILY HISTORY: Family History  Problem Relation Age of Onset   Breast cancer Mother 64   Breast cancer Cousin 30   Breast cancer Cousin 70   Asthma Father    Asthma Sister    Asthma Child     ALLERGIES:  is allergic to amoxicillin , avocado, cephalexin , fexofenadine, iodinated contrast media, ketorolac , morphine, penicillins, phenazopyridine, promethazine, diflucan  [fluconazole ], cetirizine, diclofenac  sodium, diclofenac  sodium, doxycycline, egg protein-containing drug products, hepatitis b vaccine, latex, levofloxacin , nsaids, other, tramadol , hepatitis b virus vaccines, misc. sulfonamide containing compounds, morphine and codeine, naproxen, oxycodone -acetaminophen , pyridium [phenazopyridine hcl], sulfa antibiotics, sulfasalazine, and vioxx [rofecoxib].  MEDICATIONS:  Current Outpatient Medications  Medication Sig Dispense Refill   albuterol  (VENTOLIN  HFA) 108 (90 Base) MCG/ACT inhaler Inhale 1-2 puffs INH Q4-6hr prn for chest tightness, cough, wheezing, SOB/DOE. 18 g 0   cyanocobalamin  (,VITAMIN B-12,) 1000 MCG/ML injection Inject 1,000 mcg into the muscle once a week.     diphenhydrAMINE  (BENADRYL ) 25 mg capsule Take 1 capsule (25 mg total) by mouth every 6 (six) hours as needed for itching or allergies. 30 capsule 0   EPINEPHrine  (EPIPEN  2-PAK) 0.3 mg/0.3 mL IJ SOAJ injection Inject 0.3 mLs (0.3 mg total) into the muscle as needed for anaphylaxis. 1  each 1   esomeprazole  (NEXIUM ) 40 MG capsule Take 1 capsule (40 mg total) by mouth daily at 12 noon. 30 capsule 11   fluconazole  (DIFLUCAN ) 150 MG tablet Oral     hyoscyamine  (LEVSIN ) 0.125 MG tablet Oral     levocetirizine (XYZAL ) 5 MG tablet Take 5 mg by mouth every morning.     levonorgestrel  (MIRENA ) 20 MCG/DAY IUD by Intrauterine route.     montelukast  (SINGULAIR ) 10 MG tablet Oral     Na Sulfate-K Sulfate-Mg Sulfate concentrate (SUPREP BOWEL PREP KIT) 17.5-3.13-1.6 GM/177ML SOLN Oral     nitrofurantoin, macrocrystal-monohydrate, (MACROBID) 100 MG capsule Take 100 mg by mouth 2 (two) times daily.     Olopatadine HCl 0.2 % SOLN Ophthalmic     pramipexole (MIRAPEX) 0.125 MG tablet Oral     predniSONE  (DELTASONE ) 50 MG tablet Oral     sulfamethoxazole-trimethoprim (BACTRIM DS) 800-160 MG tablet Take 1 tablet by mouth 2 (two) times daily.  tobramycin -dexamethasone  (TOBRADEX) ophthalmic ointment Ophthalmic     valACYclovir (VALTREX) 1000 MG tablet Oral     Vitamin D , Ergocalciferol , (DRISDOL ) 1.25 MG (50000 UT) CAPS capsule Take 50,000 Units by mouth once a week. Mondays     No current facility-administered medications for this visit.   PHYSICAL EXAMINATION: Vitals:   11/28/23 1308  BP: 113/72  Pulse: 72  Temp: 99.1 F (37.3 C)  SpO2: 99%    Filed Weights   11/28/23 1308  Weight: 207 lb 12.8 oz (94.3 kg)    Physical Exam Vitals reviewed.  Constitutional:      Appearance: She is not ill-appearing.  Eyes:     General: No scleral icterus.    Conjunctiva/sclera: Conjunctivae normal.  Cardiovascular:     Rate and Rhythm: Normal rate and regular rhythm.  Abdominal:     General: There is no distension.     Palpations: Abdomen is soft.     Tenderness: There is no abdominal tenderness. There is no guarding.  Musculoskeletal:        General: No deformity.     Right lower leg: No edema.     Left lower leg: No edema.  Lymphadenopathy:     Cervical: No cervical adenopathy.   Skin:    General: Skin is warm and dry.  Neurological:     Mental Status: She is alert and oriented to person, place, and time. Mental status is at baseline.  Psychiatric:        Mood and Affect: Mood normal.        Behavior: Behavior normal.    LABORATORY DATA:  I have reviewed the data as listed Lab Results  Component Value Date   WBC 3.7 (L) 11/28/2023   HGB 12.3 11/28/2023   HCT 37.2 11/28/2023   MCV 91.2 11/28/2023   PLT 295 11/28/2023   Recent Labs    09/08/23 0939 09/22/23 0639 11/28/23 0921  NA 139 137 135  K 3.6 4.3 3.7  CL 105 102 104  CO2 23 27 24   GLUCOSE 46* 100* 108*  BUN 12 14 10   CREATININE 0.66 0.66 0.64  CALCIUM 8.8* 8.8* 8.4*  GFRNONAA >60 >60 >60  PROT 6.9  --   --   ALBUMIN 3.6  --   --   AST 16  --   --   ALT 12  --   --   ALKPHOS 75  --   --   BILITOT 0.3  --   --    Iron /TIBC/Ferritin/ %Sat    Component Value Date/Time   IRON  77 11/28/2023 0920   TIBC 308 11/28/2023 0920   FERRITIN 89 11/28/2023 0920   IRONPCTSAT 25 11/28/2023 0920     RADIOGRAPHIC STUDIES: I have personally reviewed the radiological images as listed and agreed with the findings in the report. PERIPHERAL VASCULAR CATHETERIZATION Result Date: 11/07/2023 See surgical note for result.    ASSESSMENT & PLAN:   # Iron  def/ B12 def- gastric bypass/-B12 deficiency on B12 injection-July 2022-ferritin 6/iron  deficiency-s/p IV iron  infusion. POOR tolerance to oral iron . Continue barimelt+iron - sublingual absorption.  Hemoglobin 12.3, no microcytosis, ferritin 89 which is stable over the past year, iron  saturation 25%.  Hold Venofer  today.  Reviewed that she will likely need some maintenance iron  in the future due to chronic malabsorption.  # B12 deficiency- d/t gastric bypass. June 2023, B12 154 (low). On b12 injections per PCP.  Last level was August 2025 which was 343.  B12 level today pending.  Continue home monthly B12 injections   # RA-Dr.Patel [s/p arthro- sec to  RA]-stable.   # Left LE- swelling- dependant edema [s/p PCP- vascular evaluation]- defer to PCP.    DISPOSITION: Hold venofer  & b12 6 mo- labs (cbc, cmp, ferritin, iron  studies, b12), Dr Rennie, +/- venofer - la  She can also do virtual visit if she prefers.  No problem-specific Assessment & Plan notes found for this encounter.  All questions were answered. The patient knows to call the clinic with any problems, questions or concerns.  Tinnie KANDICE Dawn, NP 11/28/2023

## 2023-11-30 ENCOUNTER — Ambulatory Visit: Payer: Self-pay | Admitting: Nurse Practitioner

## 2023-11-30 NOTE — H&P (Signed)
 MRN : [id number]  Debbie Sosa is a 48 y.o. (1975/05/18) female who presents with chief complaint of legs hurt and swell.  History of Present Illness:   The patient presents to the office for follow-up status post IVC filter placement prior to joint replacement surgery.  The patient has a history of DVT and therefore was at high risk for recurrence in the perioperative timeframe after undergoing a total joint replacement.     Left hip surgery was on 09/21/2023.  The patient has been doing well with rehab and their mobility is increasing.   The patient notes the affected leg is not increasingly swollen or painful.  No signs and symptoms consistent with recurrence of DVT in the lower extremity.  Patient does note that as a baseline the affected extremity tends to swell with prolonged dependence, symptoms are much better with elevation.  The patient notes minimal edema in the morning which steadily worsens throughout the day.   The patient has not been using compression therapy at this point.   The patient is not anticoagulation.   No SOB or pleuritic chest pains.  No cough or hemoptysis.   No blood per rectum or blood in any sputum.  No excessive bruising per the patient.    No recent shortening of the patient's walking distance or new symptoms consistent with claudication.  No history of rest pain symptoms. No new ulcers or wounds of the lower extremities have occurred.   The patient denies amaurosis fugax or recent TIA symptoms. There are no recent neurological changes noted. No recent episodes of angina or shortness of breath documented.   Current Meds  Medication Sig   diphenhydrAMINE  (BENADRYL ) 25 mg capsule Take 1 capsule (25 mg total) by mouth every 6 (six) hours as needed for itching or allergies.   levocetirizine (XYZAL ) 5 MG tablet Take 5 mg by mouth every morning.   [DISCONTINUED] HYDROcodone -acetaminophen  (NORCO/VICODIN) 5-325 MG tablet Take 2 tablets by mouth  every 4 (four) hours as needed for moderate pain (pain score 4-6).   [DISCONTINUED] methocarbamol  (ROBAXIN ) 500 MG tablet Take 1 tablet (500 mg total) by mouth every 8 (eight) hours as needed for muscle spasms.   [DISCONTINUED] ondansetron  (ZOFRAN ) 4 MG tablet Take 1 tablet (4 mg total) by mouth every 6 (six) hours as needed for nausea.    Past Medical History:  Diagnosis Date   Anemia    Antiphospholipid antibody positive    Arthritis    Asthma    seasonal   Chronic venous insufficiency    COVID-19 2020   DDD (degenerative disc disease), lumbar    GERD (gastroesophageal reflux disease)    Headache    h/o migraines   History of hiatal hernia    repaired with Gastric bypass   History of kidney stones    Iron  deficiency    recieves iron  infusions every 3 months at the cancer center   Lymphedema    bil legs-sees [place] [facility]   Nausea and vomiting 10/14/2015   Obesity    Pneumonia    Pulmonary embolism (HCC)    after 2nd c-section   Sleep apnea    had gastric bypass and does not need to use cpap since surgery   Vitamin B12 deficiency     Past Surgical History:  Procedure Laterality Date   BREAST BIOPSY Left 2006   benign/clip   BREAST BIOPSY Right 2015   benign/clip   CESAREAN SECTION  CESAREAN SECTION     CHOLECYSTECTOMY     COLONOSCOPY     COLONOSCOPY WITH PROPOFOL  N/A 10/31/2018   Procedure: COLONOSCOPY WITH PROPOFOL ;  Surgeon: Therisa Bi, MD;  Location: [facility] ENDOSCOPY;  Service: Gastroenterology;  Laterality: N/A;   ESOPHAGOGASTRODUODENOSCOPY (EGD) WITH PROPOFOL  N/A 10/04/2016   Procedure: ESOPHAGOGASTRODUODENOSCOPY (EGD) WITH PROPOFOL ;  Surgeon: Therisa Bi, MD;  Location: [facility] ENDOSCOPY;  Service: Gastroenterology;  Laterality: N/A;   ESOPHAGOGASTRODUODENOSCOPY (EGD) WITH PROPOFOL  N/A 04/14/2017   Procedure: ESOPHAGOGASTRODUODENOSCOPY (EGD) WITH PROPOFOL ;  Surgeon: Therisa Bi, MD;  Location: [facility] ENDOSCOPY;  Service: Gastroenterology;  Laterality: N/A;    ESOPHAGOGASTRODUODENOSCOPY (EGD) WITH PROPOFOL  N/A 10/31/2018   Procedure: ESOPHAGOGASTRODUODENOSCOPY (EGD) WITH PROPOFOL ;  Surgeon: Therisa Bi, MD;  Location: [facility] ENDOSCOPY;  Service: Gastroenterology;  Laterality: N/A;   ESOPHAGOGASTRODUODENOSCOPY (EGD) WITH PROPOFOL  N/A 06/28/2019   Procedure: ESOPHAGOGASTRODUODENOSCOPY (EGD) WITH PROPOFOL ;  Surgeon: Therisa Bi, MD;  Location: [facility] ENDOSCOPY;  Service: Gastroenterology;  Laterality: N/A;   ESOPHAGOGASTRODUODENOSCOPY (EGD) WITH PROPOFOL  N/A 02/10/2021   Procedure: ESOPHAGOGASTRODUODENOSCOPY (EGD) WITH PROPOFOL ;  Surgeon: Therisa Bi, MD;  Location: [facility] ENDOSCOPY;  Service: Gastroenterology;  Laterality: N/A;   GASTRIC BYPASS     01/2015   HERNIA REPAIR     repaired with gastric bypass - Hiatel hernia   HYSTEROSCOPY WITH D & C N/A 09/17/2021   Procedure: DILATATION AND CURETTAGE /HYSTEROSCOPY;  Surgeon: Leonce Garnette BIRCH, MD;  Location: [facility] ORS;  Service: Gynecology;  Laterality: N/A;   INTRAUTERINE DEVICE (IUD) INSERTION N/A 09/17/2021   Procedure: INTRAUTERINE DEVICE (IUD) INSERTION - MIRENA ;  Surgeon: Leonce Garnette BIRCH, MD;  Location: [facility] ORS;  Service: Gynecology;  Laterality: N/A;   IUD REMOVAL N/A 09/17/2021   Procedure: INTRAUTERINE DEVICE (IUD) REMOVAL;  Surgeon: Leonce Garnette BIRCH, MD;  Location: [facility] ORS;  Service: Gynecology;  Laterality: N/A;   IVC FILTER INSERTION N/A 09/19/2023   Procedure: IVC FILTER INSERTION;  Surgeon: Jama Cordella MATSU, MD;  Location: [facility] INVASIVE CV LAB;  Service: Cardiovascular;  Laterality: N/A;   IVC FILTER REMOVAL N/A 11/07/2023   Procedure: IVC FILTER REMOVAL;  Surgeon: Jama Cordella MATSU, MD;  Location: [facility] INVASIVE CV LAB;  Service: Cardiovascular;  Laterality: N/A;   KNEE SURGERY Right    NASAL SINUS SURGERY     PLANTAR FASCIA SURGERY     x 3   REDUCTION MAMMAPLASTY Bilateral 2005   TOTAL HIP ARTHROPLASTY Left 09/21/2023   Procedure: ARTHROPLASTY, HIP, TOTAL, ANTERIOR APPROACH;  Surgeon:  Lorelle Hussar, MD;  Location: [facility] ORS;  Service: Orthopedics;  Laterality: Left;    Social History Social History   Tobacco Use   Smoking status: Never   Smokeless tobacco: Never  Vaping Use   Vaping status: Never Used  Substance Use Topics   Alcohol use: No   Drug use: No    Family History Family History  Problem Relation Age of Onset   Breast cancer Mother 76   Breast cancer Cousin 30   Breast cancer Cousin 13   Asthma Father    Asthma Sister    Asthma Child     Allergies  Allergen Reactions   Amoxicillin  Anaphylaxis    Throat swelling  Sob, itching  amoxicillin    Avocado Anaphylaxis   Cephalexin  Hives and Rash   Fexofenadine Shortness Of Breath   Iodinated Contrast Media Hives   Ketorolac  Hives   Morphine Anaphylaxis and Dermatitis   Penicillins Swelling    Throat swelling  Product containing penicillin  (product)   Phenazopyridine Rash, Dermatitis and Other (See Comments)  phenazopyridine hydrochloride   Promethazine Hives   Diflucan  [Fluconazole ] Rash   Cetirizine Other (See Comments)    Skin eruptures Patient denies   Diclofenac  Sodium Hives   Diclofenac  Sodium Hives   Doxycycline Other (See Comments)   Egg Protein-Containing Drug Products Other (See Comments)    Other reaction(s): RASH  egg extract   Hepatitis B Vaccine Dermatitis   Latex     With condoms   Levofloxacin  Other (See Comments)    levofloxacin    Nsaids Other (See Comments)   Other Hives and Dermatitis    Pain medicine that starts with an L   Tramadol  Hives    Can take with benadryl    Hepatitis B Virus Vaccines Rash   Misc. Sulfonamide Containing Compounds Rash   Morphine And Codeine Rash   Naproxen Rash and Dermatitis   Oxycodone -Acetaminophen  Itching   Pyridium [Phenazopyridine Hcl] Rash   Sulfa Antibiotics Rash and Other (See Comments)   Sulfasalazine Rash and Dermatitis   Vioxx [Rofecoxib] Rash     REVIEW OF SYSTEMS (Negative unless  checked)  Constitutional: [] Weight loss  [] Fever  [] Chills Cardiac: [] Chest pain   [] Chest pressure   [] Palpitations   [] Shortness of breath when laying flat   [] Shortness of breath with exertion. Vascular:  [] Pain in legs with walking   [x] Pain in legs at rest  [] History of DVT   [] Phlebitis   [x] Swelling in legs   [] Varicose veins   [] Non-healing ulcers Pulmonary:   [] Uses home oxygen   [] Productive cough   [] Hemoptysis   [] Wheeze  [] COPD   [] Asthma Neurologic:  [] Dizziness   [] Seizures   [] History of stroke   [] History of TIA  [] Aphasia   [] Vissual changes   [] Weakness or numbness in arm   [] Weakness or numbness in leg Musculoskeletal:   [] Joint swelling   [] Joint pain   [] Low back pain Hematologic:  [] Easy bruising  [] Easy bleeding   [] Hypercoagulable state   [] Anemic Gastrointestinal:  [] Diarrhea   [] Vomiting  [] Gastroesophageal reflux/heartburn   [] Difficulty swallowing. Genitourinary:  [] Chronic kidney disease   [] Difficult urination  [] Frequent urination   [] Blood in urine Skin:  [] Rashes   [] Ulcers  Psychological:  [] History of anxiety   []  History of major depression.  Physical Examination  Vitals:   11/07/23 1433 11/07/23 1442 11/07/23 1500 11/07/23 1515  BP: (!) 125/91 111/81 108/74 111/74  Pulse: 79 73 69   Resp: 14 15 14 15   Temp:  (!) 96.8 F (36 C)    TempSrc:  Temporal    SpO2: 98% 96% 96% 98%  Weight:      Height:       Body mass index is 37.6 kg/m. Gen: WD/WN, NAD Head: [place]/AT, No temporalis wasting.  Ear/Nose/Throat: Hearing grossly intact, nares w/o erythema or drainage, pinna without lesions Eyes: PER, EOMI, sclera nonicteric.  Neck: Supple, no gross masses.  No JVD.  Pulmonary:  Good air movement, no audible wheezing, no use of accessory muscles.  Cardiac: RRR, precordium not hyperdynamic. Vascular:  scattered varicosities present bilaterally.  Moderate venous stasis changes to the legs bilaterally.  2+ soft pitting edema. CEAP [id number]   Vessel Right Left   Radial Palpable Palpable  Gastrointestinal: soft, non-distended. No guarding/no peritoneal signs.  Musculoskeletal: M/S 5/5 throughout.  No deformity.  Neurologic: CN 2-12 intact. Pain and light touch intact in extremities.  Symmetrical.  Speech is fluent. Motor exam as listed above. Psychiatric: Judgment intact, Mood & affect appropriate for pt's clinical situation. Dermatologic: Venous rashes  no ulcers noted.  No changes consistent with cellulitis. Lymph : No lichenification or skin changes of chronic lymphedema.  CBC Lab Results  Component Value Date   WBC 3.7 (L) 11/28/2023   HGB 12.3 11/28/2023   HCT 37.2 11/28/2023   MCV 91.2 11/28/2023   PLT 295 11/28/2023    BMET    Component Value Date/Time   NA 135 11/28/2023 0921   NA 140 08/23/2013 1720   K 3.7 11/28/2023 0921   K 3.7 08/23/2013 1720   CL 104 11/28/2023 0921   CL 105 08/23/2013 1720   CO2 24 11/28/2023 0921   CO2 25 08/23/2013 1720   GLUCOSE 108 (H) 11/28/2023 0921   GLUCOSE 86 08/23/2013 1720   BUN 10 11/28/2023 0921   BUN 7 08/23/2013 1720   CREATININE 0.64 11/28/2023 0921   CREATININE 0.84 08/23/2013 1720   CALCIUM 8.4 (L) 11/28/2023 0921   CALCIUM 8.6 08/23/2013 1720   GFRNONAA >60 11/28/2023 0921   GFRNONAA >60 08/23/2013 1720   GFRAA >60 01/17/2019 0604   GFRAA >60 08/23/2013 1720   Estimated Creatinine Clearance: 88 mL/min (by C-G formula based on SCr of 0.64 mg/dL).  COAG Lab Results  Component Value Date   INR 1.0 05/11/2018    Radiology PERIPHERAL VASCULAR CATHETERIZATION Result Date: 11/07/2023 See surgical note for result.    Assessment/Plan 1. History of pulmonary embolism (Primary) The patient has done well status post joint replacement surgery.  Therefore, I recommend that we remove the IVC filter.   Risk and benefits were reviewed all questions answered patient has agreed to proceed.   Patient will continue to elevate to minimize swelling.   Given the history of DVT and  the possibility of post phlebitic changes such as swelling and discomfort the patient should wear graduated compression stockings.  The compression should be worn on a daily basis.  In addition, behavioral modification including elevation during the day and avoidance of prolonged dependency is helpful.     The patient will follow-up with me after the filter removal.   2. S/P total left hip arthroplasty She has done well and is doing her physical therapy.  It is appropriate to schedule removal of the filter.   3. Lymphedema Recommend:   No surgery or intervention at this point in time.     I have reviewed my discussion with the patient regarding venous insufficiency and secondary lymph edema and why it  causes symptoms. I have discussed with the patient the chronic skin changes that accompany these problems and the long term sequela such as ulceration and infection.  Patient will continue wearing graduated compression on a daily basis a prescription, if needed, was given to the patient to keep this updated. The patient will  put the compression on first thing in the morning and removing them in the evening. The patient is instructed specifically not to sleep in the compression.  In addition, behavioral modification including elevation during the day will be continued.  Diet and salt restriction will also be helpful.   Previous duplex ultrasound of the lower extremities shows normal deep venous system, superficial reflux was not present.    Following the review of the ultrasound the patient will follow up in 12 months to reassess the degree of swelling and the control that graduated compression is offering.   The patient can be assessed for a Lymph Pump at that time.  However, at this time the patient states they are satisfied with the control compression  and elevation is yielding.      4. Lumbar radiculitis Continue medications to treat the patient's degenerative disease as already ordered, these  medications have been reviewed and there are no changes at this time.   Continued activity and therapy was stressed.   Cordella Shawl, MD  11/30/2023 7:05 PM

## 2023-12-04 ENCOUNTER — Encounter (INDEPENDENT_AMBULATORY_CARE_PROVIDER_SITE_OTHER): Payer: Self-pay | Admitting: Vascular Surgery

## 2023-12-04 ENCOUNTER — Ambulatory Visit (INDEPENDENT_AMBULATORY_CARE_PROVIDER_SITE_OTHER): Admitting: Vascular Surgery

## 2023-12-04 VITALS — BP 102/65 | Resp 18 | Wt 204.8 lb

## 2023-12-04 DIAGNOSIS — I872 Venous insufficiency (chronic) (peripheral): Secondary | ICD-10-CM

## 2023-12-04 DIAGNOSIS — K219 Gastro-esophageal reflux disease without esophagitis: Secondary | ICD-10-CM

## 2023-12-04 DIAGNOSIS — M1612 Unilateral primary osteoarthritis, left hip: Secondary | ICD-10-CM

## 2023-12-04 DIAGNOSIS — Z86718 Personal history of other venous thrombosis and embolism: Secondary | ICD-10-CM

## 2023-12-04 NOTE — Progress Notes (Signed)
 MRN : [id number]  Debbie Sosa is a 48 y.o. (Jul 10, 1975) female who presents with chief complaint of legs hurt and swell.  History of Present Illness:   The patient presents to the office for follow up s/p IVC filter removal on 11/07/2023.  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  IVC filter was placed approximately one week prior to joint surgery.     The patient notes their leg continues to be a little painful with prolonged dependency and the patient notes the leg does still swell some.  Symptoms are better with elevation.  The patient notes minimal edema in the morning which steadily worsens throughout the day.     The patient has not been using compression therapy at this point.   No SOB or pleuritic chest pains.  No cough or hemoptysis.   No blood per rectum or blood in any sputum.  No excessive bruising per the patient.   No recent shortening of the patient's walking distance or new symptoms consistent with claudication.  No history of rest pain symptoms. No new ulcers or wounds of the lower extremities have occurred.  Current Meds  Medication Sig   albuterol  (VENTOLIN  HFA) 108 (90 Base) MCG/ACT inhaler Inhale 1-2 puffs INH Q4-6hr prn for chest tightness, cough, wheezing, SOB/DOE.   cyanocobalamin  (,VITAMIN B-12,) 1000 MCG/ML injection Inject 1,000 mcg into the muscle once a week.   diphenhydrAMINE  (BENADRYL ) 25 mg capsule Take 1 capsule (25 mg total) by mouth every 6 (six) hours as needed for itching or allergies.   EPINEPHrine  (EPIPEN  2-PAK) 0.3 mg/0.3 mL IJ SOAJ injection Inject 0.3 mLs (0.3 mg total) into the muscle as needed for anaphylaxis.   esomeprazole  (NEXIUM ) 40 MG capsule Take 1 capsule (40 mg total) by mouth daily at 12 noon.   fluconazole  (DIFLUCAN ) 150 MG tablet Oral   hyoscyamine  (LEVSIN ) 0.125 MG tablet Oral   levocetirizine (XYZAL ) 5 MG tablet Take 5 mg by mouth every morning.   levonorgestrel  (MIRENA ) 20 MCG/DAY IUD by  Intrauterine route.   montelukast  (SINGULAIR ) 10 MG tablet Oral   Na Sulfate-K Sulfate-Mg Sulfate concentrate (SUPREP BOWEL PREP KIT) 17.5-3.13-1.6 GM/177ML SOLN Oral   nitrofurantoin, macrocrystal-monohydrate, (MACROBID) 100 MG capsule Take 100 mg by mouth 2 (two) times daily.   Olopatadine HCl 0.2 % SOLN Ophthalmic   pramipexole (MIRAPEX) 0.125 MG tablet Oral   predniSONE  (DELTASONE ) 50 MG tablet Oral   sulfamethoxazole-trimethoprim (BACTRIM DS) 800-160 MG tablet Take 1 tablet by mouth 2 (two) times daily.   tobramycin -dexamethasone  (TOBRADEX) ophthalmic ointment Ophthalmic   valACYclovir (VALTREX) 1000 MG tablet Oral   Vitamin D , Ergocalciferol , (DRISDOL ) 1.25 MG (50000 UT) CAPS capsule Take 50,000 Units by mouth once a week. Mondays    Past Medical History:  Diagnosis Date   Anemia    Antiphospholipid antibody positive    Arthritis    Asthma    seasonal   Chronic venous insufficiency    COVID-19 2020   DDD (degenerative disc disease), lumbar    GERD (gastroesophageal reflux disease)    Headache    h/o migraines   History of hiatal hernia    repaired with Gastric bypass   History of kidney stones    Iron  deficiency    recieves iron  infusions every 3 months at the cancer center   Lymphedema    bil legs-sees [place] [facility]   Nausea and vomiting 10/14/2015   Obesity  Pneumonia    Pulmonary embolism (HCC)    after 2nd c-section   Sleep apnea    had gastric bypass and does not need to use cpap since surgery   Vitamin B12 deficiency     Past Surgical History:  Procedure Laterality Date   BREAST BIOPSY Left 2006   benign/clip   BREAST BIOPSY Right 2015   benign/clip   CESAREAN SECTION     CESAREAN SECTION     CHOLECYSTECTOMY     COLONOSCOPY     COLONOSCOPY WITH PROPOFOL  N/A 10/31/2018   Procedure: COLONOSCOPY WITH PROPOFOL ;  Surgeon: Therisa Bi, MD;  Location: [facility] ENDOSCOPY;  Service: Gastroenterology;  Laterality: N/A;   ESOPHAGOGASTRODUODENOSCOPY (EGD) WITH  PROPOFOL  N/A 10/04/2016   Procedure: ESOPHAGOGASTRODUODENOSCOPY (EGD) WITH PROPOFOL ;  Surgeon: Therisa Bi, MD;  Location: [facility] ENDOSCOPY;  Service: Gastroenterology;  Laterality: N/A;   ESOPHAGOGASTRODUODENOSCOPY (EGD) WITH PROPOFOL  N/A 04/14/2017   Procedure: ESOPHAGOGASTRODUODENOSCOPY (EGD) WITH PROPOFOL ;  Surgeon: Therisa Bi, MD;  Location: [facility] ENDOSCOPY;  Service: Gastroenterology;  Laterality: N/A;   ESOPHAGOGASTRODUODENOSCOPY (EGD) WITH PROPOFOL  N/A 10/31/2018   Procedure: ESOPHAGOGASTRODUODENOSCOPY (EGD) WITH PROPOFOL ;  Surgeon: Therisa Bi, MD;  Location: [facility] ENDOSCOPY;  Service: Gastroenterology;  Laterality: N/A;   ESOPHAGOGASTRODUODENOSCOPY (EGD) WITH PROPOFOL  N/A 06/28/2019   Procedure: ESOPHAGOGASTRODUODENOSCOPY (EGD) WITH PROPOFOL ;  Surgeon: Therisa Bi, MD;  Location: [facility] ENDOSCOPY;  Service: Gastroenterology;  Laterality: N/A;   ESOPHAGOGASTRODUODENOSCOPY (EGD) WITH PROPOFOL  N/A 02/10/2021   Procedure: ESOPHAGOGASTRODUODENOSCOPY (EGD) WITH PROPOFOL ;  Surgeon: Therisa Bi, MD;  Location: [facility] ENDOSCOPY;  Service: Gastroenterology;  Laterality: N/A;   GASTRIC BYPASS     01/2015   HERNIA REPAIR     repaired with gastric bypass - Hiatel hernia   HYSTEROSCOPY WITH D & C N/A 09/17/2021   Procedure: DILATATION AND CURETTAGE /HYSTEROSCOPY;  Surgeon: Leonce Garnette BIRCH, MD;  Location: [facility] ORS;  Service: Gynecology;  Laterality: N/A;   INTRAUTERINE DEVICE (IUD) INSERTION N/A 09/17/2021   Procedure: INTRAUTERINE DEVICE (IUD) INSERTION - MIRENA ;  Surgeon: Leonce Garnette BIRCH, MD;  Location: [facility] ORS;  Service: Gynecology;  Laterality: N/A;   IUD REMOVAL N/A 09/17/2021   Procedure: INTRAUTERINE DEVICE (IUD) REMOVAL;  Surgeon: Leonce Garnette BIRCH, MD;  Location: [facility] ORS;  Service: Gynecology;  Laterality: N/A;   IVC FILTER INSERTION N/A 09/19/2023   Procedure: IVC FILTER INSERTION;  Surgeon: Jama Cordella MATSU, MD;  Location: [facility] INVASIVE CV LAB;  Service: Cardiovascular;  Laterality: N/A;    IVC FILTER REMOVAL N/A 11/07/2023   Procedure: IVC FILTER REMOVAL;  Surgeon: Jama Cordella MATSU, MD;  Location: [facility] INVASIVE CV LAB;  Service: Cardiovascular;  Laterality: N/A;   KNEE SURGERY Right    NASAL SINUS SURGERY     PLANTAR FASCIA SURGERY     x 3   REDUCTION MAMMAPLASTY Bilateral 2005   TOTAL HIP ARTHROPLASTY Left 09/21/2023   Procedure: ARTHROPLASTY, HIP, TOTAL, ANTERIOR APPROACH;  Surgeon: Lorelle Hussar, MD;  Location: [facility] ORS;  Service: Orthopedics;  Laterality: Left;    Social History Social History   Tobacco Use   Smoking status: Never   Smokeless tobacco: Never  Vaping Use   Vaping status: Never Used  Substance Use Topics   Alcohol use: No   Drug use: No    Family History Family History  Problem Relation Age of Onset   Breast cancer Mother 2   Breast cancer Cousin 30   Breast cancer Cousin 63   Asthma Father    Asthma Sister    Asthma Child  Allergies  Allergen Reactions   Amoxicillin  Anaphylaxis    Throat swelling  Sob, itching  amoxicillin    Avocado Anaphylaxis   Cephalexin  Hives and Rash   Fexofenadine Shortness Of Breath   Iodinated Contrast Media Hives   Ketorolac  Hives   Morphine Anaphylaxis and Dermatitis   Penicillins Swelling    Throat swelling  Product containing penicillin  (product)   Phenazopyridine Rash, Dermatitis and Other (See Comments)    phenazopyridine hydrochloride   Promethazine Hives   Diflucan  [Fluconazole ] Rash   Cetirizine Other (See Comments)    Skin eruptures Patient denies   Diclofenac  Sodium Hives   Diclofenac  Sodium Hives   Doxycycline Other (See Comments)   Egg Protein-Containing Drug Products Other (See Comments)    Other reaction(s): RASH  egg extract   Hepatitis B Vaccine Dermatitis   Latex     With condoms   Levofloxacin  Other (See Comments)    levofloxacin    Nsaids Other (See Comments)   Other Hives and Dermatitis    Pain medicine that starts with an L   Tramadol  Hives    Can take  with benadryl    Hepatitis B Virus Vaccines Rash   Misc. Sulfonamide Containing Compounds Rash   Morphine And Codeine Rash   Naproxen Rash and Dermatitis   Oxycodone -Acetaminophen  Itching   Pyridium [Phenazopyridine Hcl] Rash   Sulfa Antibiotics Rash and Other (See Comments)   Sulfasalazine Rash and Dermatitis   Vioxx [Rofecoxib] Rash     REVIEW OF SYSTEMS (Negative unless checked)  Constitutional: [] Weight loss  [] Fever  [] Chills Cardiac: [] Chest pain   [] Chest pressure   [] Palpitations   [] Shortness of breath when laying flat   [] Shortness of breath with exertion. Vascular:  [] Pain in legs with walking   [x] Pain in legs at rest  [] History of DVT   [] Phlebitis   [x] Swelling in legs   [] Varicose veins   [] Non-healing ulcers Pulmonary:   [] Uses home oxygen   [] Productive cough   [] Hemoptysis   [] Wheeze  [] COPD   [] Asthma Neurologic:  [] Dizziness   [] Seizures   [] History of stroke   [] History of TIA  [] Aphasia   [] Vissual changes   [] Weakness or numbness in arm   [] Weakness or numbness in leg Musculoskeletal:   [] Joint swelling   [x] Joint pain   [] Low back pain Hematologic:  [] Easy bruising  [] Easy bleeding   [] Hypercoagulable state   [] Anemic Gastrointestinal:  [] Diarrhea   [] Vomiting  [x] Gastroesophageal reflux/heartburn   [] Difficulty swallowing. Genitourinary:  [] Chronic kidney disease   [] Difficult urination  [] Frequent urination   [] Blood in urine Skin:  [] Rashes   [] Ulcers  Psychological:  [] History of anxiety   []  History of major depression.  Physical Examination  Vitals:   12/04/23 1428  BP: 102/65  Resp: 18  Weight: 204 lb 12.8 oz (92.9 kg)   Body mass index is 38.7 kg/m. Gen: WD/WN, NAD Head: [place]/AT, No temporalis wasting.  Ear/Nose/Throat: Hearing grossly intact, nares w/o erythema or drainage, pinna without lesions Eyes: PER, EOMI, sclera nonicteric.  Neck: Supple, no gross masses.  No JVD.  Pulmonary:  Good air movement, no audible wheezing, no use of accessory  muscles.  Cardiac: RRR, precordium not hyperdynamic. Vascular:  scattered varicosities present bilaterally.  Moderate venous stasis changes to the legs bilaterally.  1+ soft pitting edema. CEAP [id number]   Vessel Right Left  Radial Palpable Palpable  Gastrointestinal: soft, non-distended. No guarding/no peritoneal signs.  Musculoskeletal: M/S 5/5 throughout.  No deformity.  Neurologic: CN 2-12 intact. Pain  and light touch intact in extremities.  Symmetrical.  Speech is fluent. Motor exam as listed above. Psychiatric: Judgment intact, Mood & affect appropriate for pt's clinical situation. Dermatologic: Venous rashes no ulcers noted.  No changes consistent with cellulitis. Lymph : No lichenification or skin changes of chronic lymphedema.  CBC Lab Results  Component Value Date   WBC 3.7 (L) 11/28/2023   HGB 12.3 11/28/2023   HCT 37.2 11/28/2023   MCV 91.2 11/28/2023   PLT 295 11/28/2023    BMET    Component Value Date/Time   NA 135 11/28/2023 0921   NA 140 08/23/2013 1720   K 3.7 11/28/2023 0921   K 3.7 08/23/2013 1720   CL 104 11/28/2023 0921   CL 105 08/23/2013 1720   CO2 24 11/28/2023 0921   CO2 25 08/23/2013 1720   GLUCOSE 108 (H) 11/28/2023 0921   GLUCOSE 86 08/23/2013 1720   BUN 10 11/28/2023 0921   BUN 7 08/23/2013 1720   CREATININE 0.64 11/28/2023 0921   CREATININE 0.84 08/23/2013 1720   CALCIUM 8.4 (L) 11/28/2023 0921   CALCIUM 8.6 08/23/2013 1720   GFRNONAA >60 11/28/2023 0921   GFRNONAA >60 08/23/2013 1720   GFRAA >60 01/17/2019 0604   GFRAA >60 08/23/2013 1720   Estimated Creatinine Clearance: 89.3 mL/min (by C-G formula based on SCr of 0.64 mg/dL).  COAG Lab Results  Component Value Date   INR 1.0 05/11/2018    Radiology PERIPHERAL VASCULAR CATHETERIZATION Result Date: 11/07/2023 See surgical note for result.    Assessment/Plan 1. History of venous thromboembolism (DVT/PE) (Primary) Recommend:   No further surgery or intervention at this  point in time.  IVC filter has been successfully removed.  The patient is not on anticoagulation.   Elevation was stressed, use of a recliner was discussed.  Graduated compression should be worn on a daily basis. The patient should wear compression beginning first thing in the morning and removing them in the evening. The patient should not sleep in the stockings.  In addition, behavioral modification including elevation during the day and avoidance of prolonged dependency should be continued.    The patient will follow-up with me as needed.  2. Chronic venous insufficiency Recommend:  No surgery or intervention at this point in time.  I have reviewed my discussion with the patient regarding venous insufficiency and why it causes symptoms. I have discussed with the patient the chronic skin changes that accompany venous insufficiency and the long term sequela such as ulceration. Patient will contnue wearing graduated compression stockings on a daily basis, as this has provided excellent control of his edema. The patient will put the stockings on first thing in the morning and removing them in the evening. The patient is reminded not to sleep in the stockings.  In addition, behavioral modification including elevation during the day will be initiated. Exercise is strongly encouraged.  Previous duplex ultrasound of the lower extremities shows normal deep system, no significant superficial reflux was identified.  Given the patient's good control and lack of any problems regarding the venous insufficiency and lymphedema a lymph pump in not need at this time.    The patient will follow up with me PRN should anything change.  The patient voices agreement with this plan.  3. Primary osteoarthritis of left hip Continue medications to treat the patient's degenerative disease as already ordered, these medications have been reviewed and there are no changes at this time.  Continued activity and  therapy was stressed.  4. Gastroesophageal reflux disease, unspecified whether esophagitis present Continue PPI as already ordered, this medication has been reviewed and there are no changes at this time.  Avoidence of caffeine and alcohol  Moderate elevation of the head of the bed     Cordella Shawl, MD  12/04/2023 2:41 PM

## 2023-12-18 NOTE — Progress Notes (Signed)
 [facility]                    PT Daily Progress Note  Patient's Name:Debbie Sosa                        MRN #[id number]      Date:12/18/2023  Visit Number: 20  Progress Related to Long and Short Term Goals:                                                                                            [] AROM:             [] PROM:     [] Strength:                                [] Pain -     while- [] at rest    [] During activity:  []  Functional goal:    []  Other:    []  Balance:     Patient / Family Education:    []  Progressed HEP to include:  [x]  Reviewed previous HEP:   ___________________________________________________________________________________   Skilled Service Provided:  To: []  right   [x]  left     []  bilateral    []   core/body stability and balance  []  neck   []   shoulder   []  upper arm/  []  elbow   []  lower arm  []  wrist   []  hand   []   fingers  []  back     [x]  hip    []  upper leg    []  knee  []  lower leg   []   ankle  foot             Ther Ex Therapeutic Procedure CPT 97110 : 45 minutes         Manual Tx       NeuroMusc          Ther Act         ADL/HEP          Gait Trng            Mech Trax    min        [x]  Inc ROM [x] Inc joint mob []  Inc ROM [] Inc ROM []  Inc Posture [] Inc Bal [] Dec pain  [x]  Inc Flex [x]  Inc Flex []  Inc Flex [] Inc Flex [] Dec pain [] Dec pain [] Inc align  []  Inc  Bal [x]  Inc ROM [] Inc Strength  [] Inc Strength          [] Inc Bal [] Normalized Gait  [] Inc mob  [x]  Inc Strength       [] Astym [] Inc Bal [] Inc Bal [] Prevent     Injury    Progressive  HEP          []   [] tension/ spasms/soft tissue mob [] Inc pain   During: [] Lifting [] Reaching [] Bending [] Sitting [] Sit to stand [] Twisting [] Pulling [] Pushing [] Inc function    [] Inc Posture [] Correct malaign [] Dec tone  []  Progressive functional HEP  E-Stim           US  x min             Ice / Heat  Hot / Cold Modality  CPT 97010:  Performed              Vaso-pneumaticcompress               Infrared            IONTO                  [] Dec inflam [] Inc Circ  [] Dec pain [] Dec swelling Setting:     [] Dec pain [] Dec pain [] Dec inflam [] Dec edema [] Inc Healing   [] Dec swelling [] Dec swelling [] Dec swelling    Level      []  1        []  2      []  3 [] Dec pain   [] Inc circulation [] Dec inflam [] Inc circulation Temp:         F [] Inc circulation   [] Inc tissue mob  [] Inc soft tissue mob     Settings:        Subjective: No new complaints.  Patient is currently ambulating independently, without the use of an assistive device, and with no pain at rest in her left hip endorsed.  Of note, patient states she was helping her son move this previous weekend which involved lots of stair negotiation so would like to leave out her step and weightbearing exercises today as to not overdo things.  Of note, patient reports an improvement of her hip discomfort while laying in prone as advised.  Treatment notes:  Patient performed therapeutic exercises and interventions as detailed per treatment log stated below. Therapy initiated with the stepper machine for bilateral lower extremity endurance and flexibility/strength, with good tolerance overall. Patient able to then maintain the progression/performance of therapeutic exercises and interventions which have been progressed to thus far in therapy today (other than leaving out her weightbearing exercises such as her step up, single limb stance, and bilateral limb leg press due to subjective reports as stated above) - all without aggravation of pain or other concerns (only muscle work/fatigue).  Treatment concluded with an ice pack for 10 minutes with patient in prone which she reports feels good. Assessment of Treatment: Continue as tolerated.  Good response to all therapeutic exercises and interventions which were implemented in therapy during today's treatment session. Patient's Response  to Treatment:  [] Dec pain  [] Inc pain    []  Inc flexibility    [] Inc endurance   []  Inc strength     []  Inc AROM      []  Inc PROM       []  Dec Spasms    [] Inc Posture / alignment     []  Inc Joint Mobility   []  Other:   Functional Improvement Noted:  Increased ability to  []  walk   []  stand for longer periods   []  dress with less help    []  lift    []   reach   []   bend  []  Other:  Remaining Impairment Requiring Continued Treatment:   [x]  Dec  flexibility   [x]  Dec  ROM   [x]   pain   [x]  Dec strength  [x]   weakness    [x]   Dec balance   [x]   swelling   [x]   inflammation   []   Spasm    []  posture/alignment    []   Other:  Plan: [x]   Continue Plan of Care        []   Add:         []   Discharge after next visit    []   Discharge to HEP   []  Other:  PT Billing Documentation Date of Onset: 09/21/23 Visit Number: 20     Frequently Used Timed Codes Therapeutic Procedure CPT 97110 : 45 minutes       Hot / Cold Modality  CPT 97010: Performed        Total Treatment Time: 55 minutes     Attestation Statement:   I personally performed the service, incident to.  (I2)    Therapist's : NORLEEN JENNY, PT    Hip Treatment Log             Date 10/15 10/20 10/27  10/29 11/06 11/11  11/13 11/17 11/20  11/24 12/01  Bike/Stepper (seat_____) S L2.2 10'  S l2.3 10' S L2.3 10' S L2.4 10' S L2.4 10'  S L2.4 10'  S L2.4 10' S L2.4 10' S L2.5 10' S L2.5 10'  S L2.5 10'   Heel Raises  3x10 3x10 3x10   3x10 3x10 3x10 3x10  3x10  Leg Press 22# 3x10 22 3x10 44# 3x10 44# 3x10 44# 2x10, 66# x10 66# 3x10 44# 3x10 66# 3x10     SLS - L LE  3x30  3x30 3x30  3x30  3x30  3x30  3x30     Mini squats             Multi-Hip Flex    10# 2x10 10# 3x10 10# 3x10  10# 3x10 10# 3x10  10# 3x10  Multi-Hip Ext    10# 3x10  10# 3x10 20# 3x10  20# 3x10 20# 2x10  20# 3x10  Multi-Hip Abd    10# 2x10 10# 3x10 10# 3x10  10# 3x10 10# 2x10  10# 3x10  Multi-Hip Add      10# 3x10  20# 3x10 20# 2x10  20# 3x10  Knee Extension 11# 3x10  11# 3x10 11# 3x10 11# 3x10  11# 2x10 22# x10, 11# 2x10 22# 3x10 22# 3x10 22# 3x10  11# 3x10  Leg Curls - Standing 22# 3x10 22 3x10 22# 3x10 22# 3x10 22# 3x10 33# x10, 22# 2x10 22# 3x10 33# x10, 22# 2x10 33# 3x10  33#  2x10  22# x10  Standing left hip extension AROM 3x10 4# 3x10 4# 3x10    3x10      Heel Slides             SLR - Abd in standing 4# 3x10 4$ stand 3x10 4# 3x10    3x10                   Marching 4# 2x10, x10 4# 3x10 stand 4# 3x10    3x10 in standing      Bridges 3x10 3x10 3x10 3x10 3x10 home 3x10 3x10 3x10 3x10 3x10  Supine bilateral hip adduction/pillow squeezes 3x10 3x10 3x10 3x10 3x10  3x10      Step-Up 4 3x10 4 3x10 4 2x10 6 x10 6 x10 4 2x10 6 2x10 8 x10 6 2x10 8 x10       SLS             Standing Hip flexor stretch (gentle)              SKC/Piriformis/HAMS/QUADS  3x30L HS    Home  3x30 L Quad 3x30 L Quad  5x30 -  L Quad 3x30 L quad  Pt discussion                                                                                                         PROM             Ultrasound/ Infrared             HP/CP with/ without  IFC         10' CP 10' CP 10' CP

## 2023-12-20 ENCOUNTER — Emergency Department

## 2023-12-20 ENCOUNTER — Inpatient Hospital Stay
Admission: EM | Admit: 2023-12-20 | Discharge: 2023-12-23 | DRG: 556 | Disposition: A | Attending: Internal Medicine | Admitting: Internal Medicine

## 2023-12-20 DIAGNOSIS — M0579 Rheumatoid arthritis with rheumatoid factor of multiple sites without organ or systems involvement: Secondary | ICD-10-CM | POA: Diagnosis present

## 2023-12-20 DIAGNOSIS — M60852 Other myositis, left thigh: Secondary | ICD-10-CM

## 2023-12-20 DIAGNOSIS — I890 Lymphedema, not elsewhere classified: Secondary | ICD-10-CM

## 2023-12-20 DIAGNOSIS — Z86718 Personal history of other venous thrombosis and embolism: Secondary | ICD-10-CM

## 2023-12-20 DIAGNOSIS — M79605 Pain in left leg: Principal | ICD-10-CM

## 2023-12-20 DIAGNOSIS — T148XXA Other injury of unspecified body region, initial encounter: Secondary | ICD-10-CM | POA: Diagnosis present

## 2023-12-20 DIAGNOSIS — M5416 Radiculopathy, lumbar region: Secondary | ICD-10-CM | POA: Diagnosis present

## 2023-12-20 DIAGNOSIS — R262 Difficulty in walking, not elsewhere classified: Secondary | ICD-10-CM

## 2023-12-20 DIAGNOSIS — Z9884 Bariatric surgery status: Secondary | ICD-10-CM

## 2023-12-20 DIAGNOSIS — Z889 Allergy status to unspecified drugs, medicaments and biological substances status: Secondary | ICD-10-CM

## 2023-12-20 LAB — CBC WITH DIFFERENTIAL/PLATELET
Abs Immature Granulocytes: 0.05 K/uL (ref 0.00–0.07)
Basophils Absolute: 0.1 K/uL (ref 0.0–0.1)
Basophils Relative: 1 %
Eosinophils Absolute: 0 K/uL (ref 0.0–0.5)
Eosinophils Relative: 0 %
HCT: 34.9 % — ABNORMAL LOW (ref 36.0–46.0)
Hemoglobin: 11.7 g/dL — ABNORMAL LOW (ref 12.0–15.0)
Immature Granulocytes: 1 %
Lymphocytes Relative: 22 %
Lymphs Abs: 2 K/uL (ref 0.7–4.0)
MCH: 30.1 pg (ref 26.0–34.0)
MCHC: 33.5 g/dL (ref 30.0–36.0)
MCV: 89.7 fL (ref 80.0–100.0)
Monocytes Absolute: 0.8 K/uL (ref 0.1–1.0)
Monocytes Relative: 9 %
Neutro Abs: 6 K/uL (ref 1.7–7.7)
Neutrophils Relative %: 67 %
Platelets: 284 K/uL (ref 150–400)
RBC: 3.89 MIL/uL (ref 3.87–5.11)
RDW: 12.8 % (ref 11.5–15.5)
WBC: 9 K/uL (ref 4.0–10.5)
nRBC: 0 % (ref 0.0–0.2)

## 2023-12-20 LAB — BASIC METABOLIC PANEL WITH GFR
Anion gap: 11 (ref 5–15)
BUN: 16 mg/dL (ref 6–20)
CO2: 25 mmol/L (ref 22–32)
Calcium: 9 mg/dL (ref 8.9–10.3)
Chloride: 104 mmol/L (ref 98–111)
Creatinine, Ser: 0.77 mg/dL (ref 0.44–1.00)
GFR, Estimated: >60 mL/min (ref >60)
Glucose, Bld: 105 mg/dL — ABNORMAL HIGH (ref 70–99)
Potassium: 3.6 mmol/L (ref 3.5–5.1)
Sodium: 139 mmol/L (ref 135–145)

## 2023-12-20 LAB — PROTIME-INR
INR: 1 (ref 0.8–1.2)
Prothrombin Time: 13.6 s (ref 11.4–15.2)

## 2023-12-20 LAB — LACTIC ACID, PLASMA: Lactic Acid, Venous: 1.6 mmol/L (ref 0.5–1.9)

## 2023-12-20 MED ORDER — FENTANYL CITRATE (PF) 50 MCG/ML IJ SOSY
50.0000 ug | PREFILLED_SYRINGE | Freq: Once | INTRAMUSCULAR | Status: AC
  Administered 2023-12-20: 50 ug via INTRAVENOUS
  Filled 2023-12-20: qty 1

## 2023-12-20 MED ORDER — ACETAMINOPHEN 500 MG PO TABS
1000.0000 mg | ORAL_TABLET | Freq: Once | ORAL | Status: AC
  Administered 2023-12-20: 1000 mg via ORAL
  Filled 2023-12-20: qty 2

## 2023-12-20 MED ORDER — DIPHENHYDRAMINE HCL 50 MG/ML IJ SOLN
25.0000 mg | Freq: Once | INTRAMUSCULAR | Status: AC
  Administered 2023-12-20: 25 mg via INTRAVENOUS
  Filled 2023-12-20: qty 1

## 2023-12-20 MED ORDER — CYCLOBENZAPRINE HCL 10 MG PO TABS
5.0000 mg | ORAL_TABLET | Freq: Once | ORAL | Status: AC
  Administered 2023-12-20: 5 mg via ORAL
  Filled 2023-12-20: qty 1

## 2023-12-20 NOTE — ED Notes (Signed)
 Dr clarine in with pt

## 2023-12-20 NOTE — ED Provider Notes (Addendum)
 [facility] Provider Note    Event Date/Time   First MD Initiated Contact with Patient 12/20/23 1835     (approximate)   History   Hip Pain (Left )  Brought by [facility], Complaints f Left hip pain became worse today, started sudden, severe 10 out 10 pain on left hip  Left hip replacement 90 days ago, had some pain before however never this bad,. Unable to walk Hx of blood cloth, currently taking prednisone  and antibiotics for sinus infection.   Reported Numbness on left calf and foot, stroke scale negative for [facility], paresthesia present    50mg  IM of Ketorolac  on transport   HPI Debbie Sosa is a 48 y.o. female PMH rheumatoid arthritis, prior gastric bypass, obesity, prior PE, prior DVT, s/p left hip arthroplasty on 09/21/23 presents for evaluation of left leg pain - Patient was walking at school today around 1:30 when she suddenly developed left leg pain.  Says it radiates from her hip down to about her knee.  No preceding trauma.  Pain is severe.  Not able to walk.  Called an ambulance.  Received 50 mg IM Toradol .  Also states she has some numbness in her foot. -No leg swelling -Otherwise has not been having any significant pain in her leg per patient report -No chest pain or shortness of breath.  Has had mild URI symptoms recently.  Outpatient records reviewed.  Appears to have an IVC filter removed 11/07/2023, had been placed prior to her joint replacement surgery given history of prior DVT and not being coagulation.     Physical Exam   Triage Vital Signs: ED Triage Vitals [12/20/23 1729]  Encounter Vitals Group     BP 116/81     Girls Systolic BP Percentile      Girls Diastolic BP Percentile      Boys Systolic BP Percentile      Boys Diastolic BP Percentile      Pulse Rate 79     Resp 20     Temp 97.8 F (36.6 C)     Temp Source Oral     SpO2 100 %     Weight      Height      Head Circumference      Peak Flow       Pain Score 10     Pain Loc      Pain Education      Exclude from Growth Chart     Most recent vital signs: Vitals:   12/20/23 1729 12/20/23 2252  BP: 116/81 104/78  Pulse: 79 79  Resp: 20 18  Temp: 97.8 F (36.6 C) 98 F (36.7 C)  SpO2: 100% 98%     General: Awake, no distress.  CV:  Good peripheral perfusion. RRR, RP 2+ Resp:  Normal effort. CTAB Abd:  No distention. Nontender to deep palpation throughout Other:  LLE no gross deformity the patient is exquisitely tender over her left hip extending down to about her left knee on the lateral anterior aspect of her thigh.  No overlying skin change.  Pedal pulse 2+.    ED Results / Procedures / Treatments   Labs (all labs ordered are listed, but only abnormal results are displayed) Labs Reviewed  CBC WITH DIFFERENTIAL/PLATELET - Abnormal; Notable for the following components:      Result Value   Hemoglobin 11.7 (*)    HCT 34.9 (*)    All other components  within normal limits  BASIC METABOLIC PANEL WITH GFR - Abnormal; Notable for the following components:   Glucose, Bld 105 (*)    All other components within normal limits  PROTIME-INR  LACTIC ACID, PLASMA     EKG  N/a   RADIOLOGY X-ray left hip, x-ray left femur, DVT ultrasound interpreted by myself radiology reports reviewed.  No acute pathology identified.  CT studies pending.    PROCEDURES:  Critical Care performed: No  Procedures   MEDICATIONS ORDERED IN ED: Medications  fentaNYL  (SUBLIMAZE ) injection 50 mcg (50 mcg Intravenous Given 12/20/23 2003)  acetaminophen  (TYLENOL ) tablet 1,000 mg (1,000 mg Oral Given 12/20/23 1956)  diphenhydrAMINE  (BENADRYL ) injection 25 mg (25 mg Intravenous Given 12/20/23 2002)  cyclobenzaprine (FLEXERIL) tablet 5 mg (5 mg Oral Given 12/20/23 1956)  fentaNYL  (SUBLIMAZE ) injection 50 mcg (50 mcg Intravenous Given 12/20/23 2249)     IMPRESSION / MDM / ASSESSMENT AND PLAN / ED COURSE  I reviewed the triage vital signs  and the nursing notes.                              DDX/MDM/AP: Differential diagnosis includes, but is not limited to, consider hardware malfunction, doubt acute fracture with no preceding trauma, consider muscle spasm, spontaneous hematoma though not obvious on initial exam, doubt ischemic process.  Do not clinically suspect DVT.  Plan: - Pain control - basic labs -XR left hip/femur - Consider CT imaging if no obvious abnormality on XR  Patient's presentation is most consistent with acute presentation with potential threat to life or bodily function.  The patient is on the cardiac monitor to evaluate for evidence of arrhythmia and/or significant heart rate changes.  ED course below.  Patient serially complaining of severe pain that appears to be on the lateral aspect of her left thigh.  Not clearly radicular and again denies any back pain.  Given no preceding trauma.  No obvious hematoma though consider possibility of intramuscular hematoma.  Discussed with orthopedics who reviewed imaging and they do not suspect any bony or surgical related issue.  DVT ultrasound negative.  At this point, patient remains with refractory pain that I am unable to control in the emergency department, good pedal pulse, lactate normal, do not suspect vascular etiology at this time and apparently does have severe contrast allergy-deferring vascular imaging with low clinical concern.  Will admit to hospitalist service for pain control and further evaluation, may benefit from PT OT.  CT L-spine and CT femur of left lower extremity pending to further evaluate for possibility of radicular symptoms, intramuscular hematoma, unlikely occult fracture.  Update: CT showing large intramuscular hematoma.  Patient updated.  Pain notably improved from arrival though persistent.  Remains with good pulses and distal perfusion, no ongoing numbness and leg.  Do not clinically suspect compartment syndrome at this time though will  continue to monitor symptoms.  Plan for orthopedics eval in the morning.  Admitted to hospitalist service.  Clinical Course as of 12/20/23 2351  Wed Dec 20, 2023  2050 CBC reviewed, remarkable, hemoglobin within baseline range [MM]  2051 XR L ype:[id number]: 1. Left hip arthroplasty with intact and well-seated hardware.   [MM]  2051 XR L femur: IMPRESSION: 1. No acute osseous abnormality.   [MM]  2148 Reevaluated, pain is improved from prior but still quite significant.  Requesting repeat dose of pain medication.  Repeat exam does seem to localize pain from distal incision site  on her lateral hip down to the lateral aspect of her knee.  No clear masses appreciated.  She says the numbness in her foot has resolved.  Pedal pulse 2+. [MM]  2154 Paging ortho to discuss [MM]  2229 Paging ortho [MM]  2234 D/w Dr. Tobie of ortho - Particular orthopedic concern from their perspective -Does recommend ruling out DVT [MM]  2348 DVT US  LLE: IMPRESSION: No evidence of deep venous thrombosis within the LEFT lower extremity.   [MM]    Clinical Course User Index [MM] Clarine Ozell LABOR, MD     FINAL CLINICAL IMPRESSION(S) / ED DIAGNOSES   Final diagnoses:  Left leg pain  Difficulty walking     Rx / DC Orders   ED Discharge Orders     None        Note:  This document was prepared using Dragon voice recognition software and may include unintentional dictation errors.   Clarine Ozell LABOR, MD 12/20/23 2352    Clarine Ozell LABOR, MD 12/20/23 2359    Clarine Ozell LABOR, MD 12/21/23 [phone]

## 2023-12-20 NOTE — ED Triage Notes (Signed)
 Brought by [facility], Complaints f Left hip pain became worse today, started sudden, severe 10 out 10 pain on left hip  Left hip replacement 90 days ago, had some pain before however never this bad,. Unable to walk Hx of blood cloth, currently taking prednisone  and antibiotics for sinus infection.   Reported Numbness on left calf and foot, stroke scale negative for [facility], paresthesia present    50mg  IM of Ketorolac  on transport

## 2023-12-20 NOTE — ED Notes (Addendum)
 Pain meds given  family with pt  siderails up x 2  dr clarine in with pt again

## 2023-12-21 ENCOUNTER — Encounter: Admission: EM | Disposition: A | Payer: Self-pay | Source: Home / Self Care | Attending: Internal Medicine

## 2023-12-21 ENCOUNTER — Inpatient Hospital Stay

## 2023-12-21 ENCOUNTER — Observation Stay: Admitting: Anesthesiology

## 2023-12-21 ENCOUNTER — Observation Stay

## 2023-12-21 DIAGNOSIS — Z86718 Personal history of other venous thrombosis and embolism: Secondary | ICD-10-CM

## 2023-12-21 DIAGNOSIS — T148XXA Other injury of unspecified body region, initial encounter: Secondary | ICD-10-CM | POA: Diagnosis not present

## 2023-12-21 DIAGNOSIS — Z889 Allergy status to unspecified drugs, medicaments and biological substances status: Secondary | ICD-10-CM

## 2023-12-21 HISTORY — PX: ANTERIOR HIP REVISION: SHX6527

## 2023-12-21 LAB — SEDIMENTATION RATE: Sed Rate: 39 mm/h — ABNORMAL HIGH (ref 0–20)

## 2023-12-21 LAB — TYPE AND SCREEN
ABO/RH(D): A POS
Antibody Screen: NEGATIVE

## 2023-12-21 LAB — C-REACTIVE PROTEIN: CRP: 1.2 mg/dL — ABNORMAL HIGH (ref <1.0)

## 2023-12-21 LAB — CK: Total CK: 147 U/L (ref 38–234)

## 2023-12-21 SURGERY — REVISION, TOTAL ARTHROPLASTY, HIP, ANTERIOR APPROACH
Anesthesia: General | Site: Hip | Laterality: Left

## 2023-12-21 SURGERY — IRRIGATION AND DEBRIDEMENT WOUND
Anesthesia: Choice | Laterality: Left

## 2023-12-21 MED ORDER — PANTOPRAZOLE SODIUM 40 MG PO TBEC
40.0000 mg | DELAYED_RELEASE_TABLET | Freq: Every day | ORAL | Status: DC
  Administered 2023-12-21 – 2023-12-22 (×2): 40 mg via ORAL
  Filled 2023-12-21 (×2): qty 1

## 2023-12-21 MED ORDER — ONDANSETRON HCL 4 MG/2ML IJ SOLN
4.0000 mg | Freq: Four times a day (QID) | INTRAMUSCULAR | Status: DC | PRN
  Administered 2023-12-23: 4 mg via INTRAVENOUS
  Filled 2023-12-21: qty 2

## 2023-12-21 MED ORDER — ACETAMINOPHEN 650 MG RE SUPP: 650.0000 mg | Freq: Four times a day (QID) | RECTAL | Status: DC | PRN

## 2023-12-21 MED ORDER — DEXAMETHASONE SOD PHOSPHATE PF 10 MG/ML IJ SOLN
INTRAMUSCULAR | Status: DC | PRN
  Administered 2023-12-21: 10 mg via INTRAVENOUS

## 2023-12-21 MED ORDER — PHENYLEPHRINE 80 MCG/ML (10ML) SYRINGE FOR IV PUSH (FOR BLOOD PRESSURE SUPPORT)
PREFILLED_SYRINGE | INTRAVENOUS | Status: DC | PRN
  Administered 2023-12-21 (×2): 80 ug via INTRAVENOUS
  Administered 2023-12-21: 160 ug via INTRAVENOUS

## 2023-12-21 MED ORDER — LACTATED RINGERS IV SOLN: INTRAVENOUS | Status: DC

## 2023-12-21 MED ORDER — SUCCINYLCHOLINE CHLORIDE 200 MG/10ML IV SOSY
PREFILLED_SYRINGE | INTRAVENOUS | Status: DC | PRN
  Administered 2023-12-21: 150 mg via INTRAVENOUS

## 2023-12-21 MED ORDER — ORAL CARE MOUTH RINSE: 15.0000 mL | Freq: Once | OROMUCOSAL | Status: AC

## 2023-12-21 MED ORDER — METHYLPREDNISOLONE SODIUM SUCC 40 MG IJ SOLR
40.0000 mg | Freq: Once | INTRAMUSCULAR | Status: AC
  Administered 2023-12-21: 40 mg via INTRAVENOUS
  Filled 2023-12-21: qty 1

## 2023-12-21 MED ORDER — FENTANYL CITRATE (PF) 50 MCG/ML IJ SOSY
12.5000 ug | PREFILLED_SYRINGE | INTRAMUSCULAR | Status: DC | PRN
  Administered 2023-12-21 – 2023-12-22 (×5): 50 ug via INTRAVENOUS
  Filled 2023-12-21 (×6): qty 1

## 2023-12-21 MED ORDER — PROPOFOL 10 MG/ML IV BOLUS
INTRAVENOUS | Status: DC | PRN
  Administered 2023-12-21: 50 mg via INTRAVENOUS
  Administered 2023-12-21: 200 mg via INTRAVENOUS

## 2023-12-21 MED ORDER — DIPHENHYDRAMINE HCL 25 MG PO CAPS
50.0000 mg | ORAL_CAPSULE | Freq: Once | ORAL | Status: AC
  Administered 2023-12-21: 50 mg via ORAL
  Filled 2023-12-21: qty 2

## 2023-12-21 MED ORDER — ONDANSETRON HCL 4 MG PO TABS: 4.0000 mg | ORAL_TABLET | Freq: Four times a day (QID) | ORAL | Status: DC | PRN

## 2023-12-21 MED ORDER — FENTANYL CITRATE (PF) 100 MCG/2ML IJ SOLN: 25.0000 ug | INTRAMUSCULAR | Status: DC | PRN

## 2023-12-21 MED ORDER — DEXAMETHASONE SODIUM PHOSPHATE 4 MG/ML IJ SOLN
8.0000 mg | Freq: Once | INTRAMUSCULAR | Status: AC
  Administered 2023-12-22: 8 mg via INTRAVENOUS
  Filled 2023-12-21: qty 2

## 2023-12-21 MED ORDER — METHOCARBAMOL 1000 MG/10ML IJ SOLN
500.0000 mg | Freq: Four times a day (QID) | INTRAMUSCULAR | Status: DC | PRN
  Administered 2023-12-21 (×2): 500 mg via INTRAVENOUS
  Filled 2023-12-21 (×3): qty 5

## 2023-12-21 MED ORDER — FENTANYL CITRATE (PF) 100 MCG/2ML IJ SOLN
INTRAMUSCULAR | Status: AC
  Filled 2023-12-21: qty 2

## 2023-12-21 MED ORDER — MIDAZOLAM HCL 2 MG/2ML IJ SOLN
INTRAMUSCULAR | Status: AC
  Filled 2023-12-21: qty 2

## 2023-12-21 MED ORDER — OXYCODONE HCL 5 MG PO TABS
5.0000 mg | ORAL_TABLET | Freq: Once | ORAL | Status: AC
  Administered 2023-12-21: 5 mg via ORAL
  Filled 2023-12-21: qty 1

## 2023-12-21 MED ORDER — DIPHENHYDRAMINE HCL 50 MG/ML IJ SOLN: 25.0000 mg | Freq: Four times a day (QID) | INTRAMUSCULAR | Status: DC | PRN

## 2023-12-21 MED ORDER — CHLORHEXIDINE GLUCONATE 0.12 % MT SOLN
OROMUCOSAL | Status: AC
  Filled 2023-12-21: qty 15

## 2023-12-21 MED ORDER — ALBUTEROL SULFATE HFA 108 (90 BASE) MCG/ACT IN AERS
INHALATION_SPRAY | RESPIRATORY_TRACT | Status: DC | PRN
  Administered 2023-12-21: 6 via RESPIRATORY_TRACT

## 2023-12-21 MED ORDER — ALBUTEROL SULFATE (2.5 MG/3ML) 0.083% IN NEBU: 2.5000 mg | INHALATION_SOLUTION | RESPIRATORY_TRACT | Status: DC | PRN

## 2023-12-21 MED ORDER — FENTANYL CITRATE (PF) 100 MCG/2ML IJ SOLN
INTRAMUSCULAR | Status: DC | PRN
  Administered 2023-12-21 (×2): 50 ug via INTRAVENOUS

## 2023-12-21 MED ORDER — ALBUTEROL SULFATE HFA 108 (90 BASE) MCG/ACT IN AERS
INHALATION_SPRAY | RESPIRATORY_TRACT | Status: AC
  Filled 2023-12-21: qty 6.7

## 2023-12-21 MED ORDER — DIPHENHYDRAMINE HCL 50 MG/ML IJ SOLN: 50.0000 mg | Freq: Once | INTRAMUSCULAR | Status: AC

## 2023-12-21 MED ORDER — EPINEPHRINE 0.3 MG/0.3ML IJ SOAJ: 0.3000 mg | INTRAMUSCULAR | Status: DC | PRN

## 2023-12-21 MED ORDER — IOHEXOL 350 MG/ML SOLN
100.0000 mL | Freq: Once | INTRAVENOUS | Status: AC | PRN
  Administered 2023-12-21: 100 mL via INTRAVENOUS

## 2023-12-21 MED ORDER — LIDOCAINE HCL (CARDIAC) PF 100 MG/5ML IV SOSY
PREFILLED_SYRINGE | INTRAVENOUS | Status: DC | PRN
  Administered 2023-12-21: 100 mg via INTRAVENOUS

## 2023-12-21 MED ORDER — ACETAMINOPHEN 325 MG PO TABS
650.0000 mg | ORAL_TABLET | Freq: Four times a day (QID) | ORAL | Status: DC | PRN
  Administered 2023-12-22 (×2): 650 mg via ORAL
  Filled 2023-12-21 (×2): qty 2

## 2023-12-21 MED ORDER — OXYCODONE HCL 5 MG PO TABS
5.0000 mg | ORAL_TABLET | ORAL | Status: DC | PRN
  Administered 2023-12-21 – 2023-12-23 (×7): 5 mg via ORAL
  Filled 2023-12-21 (×8): qty 1

## 2023-12-21 MED ORDER — ACETAMINOPHEN 10 MG/ML IV SOLN
INTRAVENOUS | Status: AC
  Filled 2023-12-21: qty 100

## 2023-12-21 MED ORDER — MIDAZOLAM HCL (PF) 2 MG/2ML IJ SOLN
INTRAMUSCULAR | Status: DC | PRN
  Administered 2023-12-21: 2 mg via INTRAVENOUS

## 2023-12-21 MED ORDER — CHLORHEXIDINE GLUCONATE 0.12 % MT SOLN
15.0000 mL | Freq: Once | OROMUCOSAL | Status: AC
  Administered 2023-12-21: 15 mL via OROMUCOSAL

## 2023-12-21 SURGICAL SUPPLY — 7 items
CHLORAPREP W/TINT 26 (MISCELLANEOUS) ×1 IMPLANT
DRAPE UTILITY 15X26 TOWEL STRL (DRAPES) IMPLANT
GLOVE SURG SYN 7.5 PF PI (GLOVE) ×1 IMPLANT
KIT PATIENT CARE HANA TABLE (KITS) ×1 IMPLANT
KIT TURNOVER CYSTO (KITS) ×1 IMPLANT
NDL SPNL 18GX3.5 QUINCKE PK (NEEDLE) IMPLANT
TRAY FOLEY SLVR 16FR LF STAT (SET/KITS/TRAYS/PACK) ×1 IMPLANT

## 2023-12-21 NOTE — Hospital Course (Signed)
 Same day note  Debbie Sosa is a 48 y.o. female with medical history significant for Rheumatoid arthritis, anemia with iron  deficiency and vitamin B12 deficiency, s/p gastric bypass, lymphedema, prior DVT/PE, lumbar radiculopathy, multiple medication allergies, who underwent left hip arthroplasty 09/21/2023 and had completed 4 weeks of prophylactic Lovenox  in early October presented to hospital with left thigh sudden onset of pain while walking with difficulty weightbearing with numbness of the left calf and foot .  She states that she noticed in the previous week she seemed to be having more difficulty at her physical therapy sessions and also noted that her pants leg was fitting more tightly tighter on the left thigh than the right.  In the ED vitals were stable.  Hemoglobin was 11.3.  CT scan of the femur showed large hematoma and edema in the left thigh and the anterior compartment musculature.  CT lumbar spine showed DJD.  Lower extremity DVT was negative for DVT.  Patient was treated with fentanyl  Tylenol  Flexeril .  Orthopedics was consulted and patient was considered for admission to the hospital for further evaluation and treatment  Patient seen and examined at bedside.  Patient was admitted to the hospital for left hip pain  At the time of my evaluation, patient complains of  Physical examination reveals  Laboratory data and imaging was reviewed  Assessment and Plan.   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  Orthopedics has been consulted for possible compartment syndrome.  CTA has been requested.  Since patient has history of hives with iodinated contrast undergoing preparation   History of venous thromboembolism (DVT/PE) S/p perioperative IVC placement (09/19/2023 to 11/07/2023)  S/p 4-week prophylactic post op Lovenox  (9-10/2023) Venous ultrasound negative for  DVT.  Will continue to monitor     Multiple drug allergies On EpiPen  Monitor for allergies.  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  Follow-up with rheumatology as outpatient.   Lymphedema, chronic Continue supportive care with compressive stockings.   History of gastric bypass History of iron  deficiency/vitamin B12 deficiency. Previously on Venofer .  Hemoglobin of 11.3 today.  Continue to monitor in the setting of large hematoma.  No Charge  Signed,  Vernal Anselm Alstrom, MD [facility]

## 2023-12-21 NOTE — Anesthesia Preprocedure Evaluation (Signed)
 Anesthesia Evaluation  Patient identified by MRN, date of birth, ID band Patient awake    Reviewed: Allergy & Precautions, NPO status , Patient's Chart, lab work & pertinent test results  History of Anesthesia Complications Negative for: history of anesthetic complications  Airway Mallampati: III  TM Distance: <3 FB Neck ROM: full    Dental  (+) Missing   Pulmonary asthma , sleep apnea    Pulmonary exam normal        Cardiovascular Exercise Tolerance: Good (-) angina negative cardio ROS Normal cardiovascular exam     Neuro/Psych  Headaches  Neuromuscular disease  negative psych ROS   GI/Hepatic Neg liver ROS, hiatal hernia,GERD  Controlled,,  Endo/Other  negative endocrine ROS    Renal/GU      Musculoskeletal   Abdominal   Peds  Hematology negative hematology ROS (+)   Anesthesia Other Findings Past Medical History: No date: Anemia No date: Antiphospholipid antibody positive No date: Arthritis No date: Asthma     Comment:  seasonal No date: Chronic venous insufficiency 2020: COVID-19 No date: DDD (degenerative disc disease), lumbar No date: GERD (gastroesophageal reflux disease) No date: Headache     Comment:  h/o migraines No date: History of hiatal hernia     Comment:  repaired with Gastric bypass No date: History of kidney stones No date: Iron  deficiency     Comment:  recieves iron  infusions every 3 months at the cancer               center No date: Lymphedema     Comment:  bil legs-sees [place] [facility] 10/14/2015: Nausea and vomiting No date: Obesity No date: Pneumonia No date: Pulmonary embolism (HCC)     Comment:  after 2nd c-section No date: Sleep apnea     Comment:  had gastric bypass and does not need to use cpap since               surgery No date: Vitamin B12 deficiency  Past Surgical History: 2006: BREAST BIOPSY; Left     Comment:  benign/clip 2015: BREAST BIOPSY; Right     Comment:   benign/clip No date: CESAREAN SECTION No date: CESAREAN SECTION No date: CHOLECYSTECTOMY No date: COLONOSCOPY 10/31/2018: COLONOSCOPY WITH PROPOFOL ; N/A     Comment:  Procedure: COLONOSCOPY WITH PROPOFOL ;  Surgeon: Therisa Bi, MD;  Location: [facility] ENDOSCOPY;  Service:               Gastroenterology;  Laterality: N/A; 10/04/2016: ESOPHAGOGASTRODUODENOSCOPY (EGD) WITH PROPOFOL ; N/A     Comment:  Procedure: ESOPHAGOGASTRODUODENOSCOPY (EGD) WITH               PROPOFOL ;  Surgeon: Therisa Bi, MD;  Location: [facility]               ENDOSCOPY;  Service: Gastroenterology;  Laterality: N/A; 04/14/2017: ESOPHAGOGASTRODUODENOSCOPY (EGD) WITH PROPOFOL ; N/A     Comment:  Procedure: ESOPHAGOGASTRODUODENOSCOPY (EGD) WITH               PROPOFOL ;  Surgeon: Therisa Bi, MD;  Location: [facility]               ENDOSCOPY;  Service: Gastroenterology;  Laterality: N/A; 10/31/2018: ESOPHAGOGASTRODUODENOSCOPY (EGD) WITH PROPOFOL ; N/A     Comment:  Procedure: ESOPHAGOGASTRODUODENOSCOPY (EGD) WITH               PROPOFOL ;  Surgeon: Therisa Bi, MD;  Location: [facility]  ENDOSCOPY;  Service: Gastroenterology;  Laterality: N/A; 06/28/2019: ESOPHAGOGASTRODUODENOSCOPY (EGD) WITH PROPOFOL ; N/A     Comment:  Procedure: ESOPHAGOGASTRODUODENOSCOPY (EGD) WITH               PROPOFOL ;  Surgeon: Therisa Bi, MD;  Location: [facility]               ENDOSCOPY;  Service: Gastroenterology;  Laterality: N/A; 02/10/2021: ESOPHAGOGASTRODUODENOSCOPY (EGD) WITH PROPOFOL ; N/A     Comment:  Procedure: ESOPHAGOGASTRODUODENOSCOPY (EGD) WITH               PROPOFOL ;  Surgeon: Therisa Bi, MD;  Location: [facility]               ENDOSCOPY;  Service: Gastroenterology;  Laterality: N/A; No date: GASTRIC BYPASS     Comment:  01/2015 No date: HERNIA REPAIR     Comment:  repaired with gastric bypass - Hiatel hernia 09/17/2021: HYSTEROSCOPY WITH D & C; N/A     Comment:  Procedure: DILATATION AND CURETTAGE /HYSTEROSCOPY;                 Surgeon: Leonce Garnette BIRCH, MD;  Location: [facility] ORS;                Service: Gynecology;  Laterality: N/A; 09/17/2021: INTRAUTERINE DEVICE (IUD) INSERTION; N/A     Comment:  Procedure: INTRAUTERINE DEVICE (IUD) INSERTION - MIRENA ;              Surgeon: Leonce Garnette BIRCH, MD;  Location: [facility] ORS;                Service: Gynecology;  Laterality: N/A; 09/17/2021: IUD REMOVAL; N/A     Comment:  Procedure: INTRAUTERINE DEVICE (IUD) REMOVAL;  Surgeon:               Leonce Garnette BIRCH, MD;  Location: [facility] ORS;  Service:               Gynecology;  Laterality: N/A; 09/19/2023: IVC FILTER INSERTION; N/A     Comment:  Procedure: IVC FILTER INSERTION;  Surgeon: Jama Cordella MATSU, MD;  Location: [facility] INVASIVE CV LAB;  Service:              Cardiovascular;  Laterality: N/A; 11/07/2023: IVC FILTER REMOVAL; N/A     Comment:  Procedure: IVC FILTER REMOVAL;  Surgeon: Jama Cordella MATSU, MD;  Location: [facility] INVASIVE CV LAB;  Service:              Cardiovascular;  Laterality: N/A; No date: KNEE SURGERY; Right No date: NASAL SINUS SURGERY No date: PLANTAR FASCIA SURGERY     Comment:  x 3 2005: REDUCTION MAMMAPLASTY; Bilateral 09/21/2023: TOTAL HIP ARTHROPLASTY; Left     Comment:  Procedure: ARTHROPLASTY, HIP, TOTAL, ANTERIOR APPROACH;               Surgeon: Lorelle Hussar, MD;  Location: [facility] ORS;                Service: Orthopedics;  Laterality: Left;  BMI    Body Mass Index: 38.17 kg/m      Reproductive/Obstetrics negative OB ROS                              Anesthesia Physical Anesthesia Plan  ASA: 3  Anesthesia Plan: General ETT   Post-op Pain Management:    Induction: Intravenous  PONV Risk Score and Plan: Ondansetron , Dexamethasone , Midazolam  and Treatment may vary due to age or medical condition  Airway Management Planned: Oral ETT  Additional Equipment:   Intra-op Plan:   Post-operative Plan: Extubation in OR  Informed  Consent: I have reviewed the patients History and Physical, chart, labs and discussed the procedure including the risks, benefits and alternatives for the proposed anesthesia with the patient or authorized representative who has indicated his/her understanding and acceptance.     Dental Advisory Given  Plan Discussed with: Anesthesiologist, CRNA and Surgeon  Anesthesia Plan Comments: (Patient consented for risks of anesthesia including but not limited to:  - adverse reactions to medications - damage to eyes, teeth, lips or other oral mucosa - nerve damage due to positioning  - sore throat or hoarseness - Damage to heart, brain, nerves, lungs, other parts of body or loss of life  Patient voiced understanding and assent.)        Anesthesia Quick Evaluation

## 2023-12-21 NOTE — Assessment & Plan Note (Signed)
 No acute issues suspected Follows with vascular Uses compressive stockings

## 2023-12-21 NOTE — ED Notes (Signed)
 Rounded on pt - pt resting, connected to VS monitor, has call bell within reach. Denies any additional needs at this time. Family at bedside.

## 2023-12-21 NOTE — Assessment & Plan Note (Addendum)
 S/p perioperative IVC placement (09/19/2023 to 11/07/2023)  S/p 4-week prophylactic post op Lovenox  (9-10/2023) Venous ultrasound negative for DVT

## 2023-12-21 NOTE — Progress Notes (Addendum)
 Bedside report received from [facility], PACU RN. Patient back from PACU to room 221A. A+Ox4, RA. Apical pulse at 105, otherwise VSS. Left hip with dressing in place, no bleeding noted. Bed locked to lowest position, call bell and needs within reach. Patient ordering dinner. Pain management observed. Will continue to monitor and assess with plan of care.

## 2023-12-21 NOTE — Interval H&P Note (Signed)
 I saw and examined the patient at bedside. The patient reports no significant change in pain tenderness over any motion and palpation of the left hip and proximal thigh She denies any fevers chills or systemic symptoms she does report recently she was going to be started on sinusitis for some ear pain but has not had any systemic symptoms from that  I reviewed the CT femur from last night and the CTA performed today with the radiologist over the phone.  The interventional radiologist I spoke with today who reviewed the images does not see any discrete fluid collections on today's study no evidence of a hematoma and does not agree with evidence of a hematoma on last night's CT femur.  Instead it looks more like muscle edema and swelling with no focal fluid collections.  On the CT femur last night there is a physiologic amount of fluid in the hip but no other collections or concerns for abscess.  I reviewed the physical exam clinical findings and lab findings with the patient she is very tender in the left leg with any palpation or motion.  She does not have a white count she does not have a fever she does not have any other concerning signs or symptoms for an ongoing systemic infection.  Given her exam and the radiologic findings I do think it is prudent to rule out an infection in her left hip.  We discussed doing a aspiration of the left hip in the operating room under fluoroscopy to rule down any sort of infection in the hip or a hematoma.  We will also plan to do an ultrasound in the operating room to evaluate for any fluid collections in the thigh at this time.  Should we find any sort of purulence or concerning fluid from the hip then we would plan to proceed with a hip irrigation and debridement possible headliner exchange depending on the intraoperative findings.  Should we find old appearing or new appearing blood or a fluid collection of the thigh consistent with hematoma on ultrasound then we would  proceed with irrigation and debridement and based on the findings during the surgery consider head and liner exchange.  We will set any fluid aspirated to the lab for evaluation and culture growth.  All questions answered and the patient agrees with this plan will move forward to the operating room today to start with aspiration of the left hip and ultrasound.  Consent was signed witnessed and placed on the chart.  The left leg was marked.

## 2023-12-21 NOTE — Anesthesia Procedure Notes (Signed)
 Procedure Name: Intubation Date/Time: 12/21/2023 4:03 PM  Performed by: Colon Morna SQUIBB, RNPre-anesthesia Checklist: Patient identified, Patient being monitored, Timeout performed, Emergency Drugs available and Suction available Patient Re-evaluated:Patient Re-evaluated prior to induction Oxygen Delivery Method: Circle system utilized Preoxygenation: Pre-oxygenation with 100% oxygen Induction Type: IV induction Ventilation: Mask ventilation without difficulty Laryngoscope Size: Mac, 3 and McGrath Grade View: Grade I Tube type: Oral Tube size: 7.0 mm Number of attempts: 1 Airway Equipment and Method: Stylet Placement Confirmation: ETT inserted through vocal cords under direct vision, positive ETCO2 and breath sounds checked- equal and bilateral Secured at: 21 cm Tube secured with: Tape Dental Injury: Teeth and Oropharynx as per pre-operative assessment

## 2023-12-21 NOTE — Assessment & Plan Note (Signed)
 No acute issues suspected Follows with rheumatology

## 2023-12-21 NOTE — Interval H&P Note (Signed)
 Additional addendum the risks and benefits and procedural aspects of a left hip headliner revision were also discussed with the patient should that procedure be necessary.

## 2023-12-21 NOTE — Progress Notes (Signed)
 OT Cancellation Note  Patient Details Name: Debbie Sosa MRN: [id number] DOB: 1975/11/08   Cancelled Treatment:    Reason Eval/Treat Not Completed: Medical issues which prohibited therapy. Orders received and chart reviewed. Per EMR pt pending surgery today. OT to hold and re-attempt when medically appropriate.   Elston Slot, M.S. OTR/L  12/21/23, 1:17 PM  ascom [phone]

## 2023-12-21 NOTE — Assessment & Plan Note (Addendum)
 On EpiPen  Monitor for allergies.  Benadryl  as needed

## 2023-12-21 NOTE — Op Note (Signed)
 Patient Name: Debbie Sosa  FMW:[id number]  Pre-Operative Diagnosis: Left hip rule out hematoma, rule out left hip septic arthritis  Post-Operative Diagnosis: Left hip dry tap no effusion, left thigh ultrasound no hematoma noted, left thigh myositis  Procedure: Left thigh ultrasound and exam under anesthesia, left hip fluoroscopic guided attempted arthrocentesis  Date of Procedure: 12/21/2023  Surgeon: Arthea Sheer MD  Assistant: None  Anesthesiologist: Piscitello  Anesthesia: Sedation with oral airway  EBL: min   Complications: None   Brief history: The patient is a 48 year old female with history of left total hip replacement over 3 months ago.  She presented with a few days of new onset left thigh pain and swelling.  Initial CT scan showed concern for a large hematoma in the thigh repeat CTA showed no evidence of a hematoma but significant muscle edema and swelling with no focal drainable collections.  I discussed treatment options with the patient and the importance of ruling out a potential early septic hip given recent sinus infection.  After reviewing the risks and benefits the patient agreed with the plan move forward with a ultrasound evaluation under anesthesia of the left eye and a attempt at a left hip aspiration.  All preoperative imaging was reviewed and an appropriate surgical plan was made prior to surgery.    Description of procedure: The patient was brought to the operating room where laterality was confirmed by all those present to be the left side.  Procedural sedation was administered and the patient was positioned supine on a operating room Hana table with all bony prominences well-padded.  An ultrasound was used to evaluate the entire left thigh from the knee to the ileum through multiple windows longitudinal and transverse windows.  There were no fluid collections and no hematomas identified no effusion noted at the hip.  There was some thickening of the  vastus lateralis and vastus intermedius musculature with some signal changes but no fluid collections noted.  The left hip was prepped with chlorhexidine  and under fluoroscopic guidance an 18-gauge spinal needle was introduced into the hip and shown to be making contact with the femoral component.  4 attempted aspirations were made around the femoral component and femoral head after feeling direct contact with the components.  No fluid at all was able to be aspirated from the hip consistent with a dry tap and no effusion.   The patient tolerated procedure well, good pulses were found on the operative side.  A Band-Aid was applied to the aspiration site and the thigh was wrapped in a compressive Ace wrap. Patient was transferred to the recovery room in stable condition and tolerated the procedure well.

## 2023-12-21 NOTE — Progress Notes (Signed)
 PT Cancellation Note  Patient Details Name: Debbie Sosa MRN: [id number] DOB: February 03, 1975   Cancelled Treatment:    Reason Eval/Treat Not Completed: Patient at procedure or test/unavailable. Orders received and chart reviewed. Per EMR pt pending hip arthroplasty revision today. PT to hold and re-attempt when medically appropriate.    Dorina HERO. Fairly IV, PT, DPT Physical Therapist- [place]  [facility] 12/21/2023, 12:56 PM

## 2023-12-21 NOTE — Consult Note (Signed)
 ORTHOPAEDIC CONSULTATION  REQUESTING PHYSICIAN: Cleatus Delayne GAILS, MD  Chief Complaint:   L thigh pain  History of Present Illness: Debbie Sosa is a 48 y.o. female who underwent left THA by Dr. Lorelle 3 months ago.  She had been recovering relatively well postoperatively until approximately 1 week ago.  She states at that time, she noted some increased pain with physical therapy and difficulty performing leg presses and knee flexion exercises.  This slightly worsened over the past week untilthis afternoon when her pain became quite severe when she was just walking at school today.  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  She was unable to ambulate afterwards and EMS brought her over here to our ED.  She denies any specific traumatic incident.  The only possible trauma she may have had is that her 20 pound granddaughter was crawling on her lap approximately 1 week ago.  In the emergency department, she was having severe pain about the entirety of the left thigh.  It is been quite sensitive to touch.  She is having difficulty with knee flexion and extension and has been unable to bear weight.  She was given fentanyl  and most recently oxycodone .  She states that oxycodone  has eased her pain somewhat.  She was having some numbness sensations in the left foot, but this has resolved.  Of note, she has a history of prior PE and had an IVC filter placed for the surgery.  It was removed approximately 6 weeks ago.  DVT scan in the emergency department was negative. Past Medical History:  Diagnosis Date   Anemia    Antiphospholipid antibody positive    Arthritis    Asthma    seasonal   Chronic venous insufficiency    COVID-19 2020   DDD (degenerative disc disease), lumbar    GERD (gastroesophageal reflux disease)    Headache    h/o migraines   History of hiatal hernia    repaired with Gastric bypass   History  of kidney stones    Iron  deficiency    recieves iron  infusions every 3 months at the cancer center   Lymphedema    bil legs-sees [place] [facility]   Nausea and vomiting 10/14/2015   Obesity    Pneumonia    Pulmonary embolism (HCC)    after 2nd c-section   Sleep apnea    had gastric bypass and does not need to use cpap since surgery   Vitamin B12 deficiency    Past Surgical History:  Procedure Laterality Date   BREAST BIOPSY Left 2006   benign/clip   BREAST BIOPSY Right 2015   benign/clip   CESAREAN SECTION     CESAREAN SECTION     CHOLECYSTECTOMY     COLONOSCOPY     COLONOSCOPY WITH PROPOFOL  N/A 10/31/2018   Procedure: COLONOSCOPY WITH PROPOFOL ;  Surgeon: Therisa Bi, MD;  Location: [facility] ENDOSCOPY;  Service: Gastroenterology;  Laterality: N/A;   ESOPHAGOGASTRODUODENOSCOPY (EGD) WITH PROPOFOL  N/A 10/04/2016   Procedure: ESOPHAGOGASTRODUODENOSCOPY (EGD) WITH PROPOFOL ;  Surgeon: Therisa Bi, MD;  Location: [facility] ENDOSCOPY;  Service: Gastroenterology;  Laterality: N/A;   ESOPHAGOGASTRODUODENOSCOPY (EGD) WITH PROPOFOL  N/A 04/14/2017   Procedure: ESOPHAGOGASTRODUODENOSCOPY (EGD) WITH PROPOFOL ;  Surgeon: Therisa Bi, MD;  Location: [facility] ENDOSCOPY;  Service: Gastroenterology;  Laterality: N/A;   ESOPHAGOGASTRODUODENOSCOPY (EGD) WITH PROPOFOL  N/A 10/31/2018   Procedure: ESOPHAGOGASTRODUODENOSCOPY (EGD) WITH PROPOFOL ;  Surgeon: Therisa Bi, MD;  Location: [facility] ENDOSCOPY;  Service: Gastroenterology;  Laterality: N/A;   ESOPHAGOGASTRODUODENOSCOPY (EGD) WITH  PROPOFOL  N/A 06/28/2019   Procedure: ESOPHAGOGASTRODUODENOSCOPY (EGD) WITH PROPOFOL ;  Surgeon: Therisa Bi, MD;  Location: [facility] END[facility]COPY;  Service: Gastroenterology;  Laterality: N/A;   ESOPHAGOGASTRODUODENOSCOPY (EGD) WITH PROPOFOL  N/A 02/10/2021   Procedure: ESOPHAGOGASTRODUODENOSCOPY (EGD) WITH PROPOFOL ;  Surgeon: Therisa Bi, MD;  Location: [facility] ENDOSCOPY;  Service: Gastroenterology;  Laterality: N/A;   GASTRIC BYPASS     01/2015   HERNIA  REPAIR     repaired with gastric bypass - Hiatel hernia   HYSTEROSCOPY WITH D & C N/A 09/17/2021   Procedure: DILATATION AND CURETTAGE /HYSTEROSCOPY;  Surgeon: Leonce Garnette BIRCH, MD;  Location: [facility] ORS;  Service: Gynecology;  Laterality: N/A;   INTRAUTERINE DEVICE (IUD) INSERTION N/A 09/17/2021   Procedure: INTRAUTERINE DEVICE (IUD) INSERTION - MIRENA ;  Surgeon: Leonce Garnette BIRCH, MD;  Location: [facility] ORS;  Service: Gynecology;  Laterality: N/A;   IUD REMOVAL N/A 09/17/2021   Procedure: INTRAUTERINE DEVICE (IUD) REMOVAL;  Surgeon: Leonce Garnette BIRCH, MD;  Location: [facility] ORS;  Service: Gynecology;  Laterality: N/A;   IVC FILTER INSERTION N/A 09/19/2023   Procedure: IVC FILTER INSERTION;  Surgeon: Jama Cordella MATSU, MD;  Location: [facility] INVASIVE CV LAB;  Service: Cardiovascular;  Laterality: N/A;   IVC FILTER REMOVAL N/A 11/07/2023   Procedure: IVC FILTER REMOVAL;  Surgeon: Jama Cordella MATSU, MD;  Location: [facility] INVASIVE CV LAB;  Service: Cardiovascular;  Laterality: N/A;   KNEE SURGERY Right    NASAL SINUS SURGERY     PLANTAR FASCIA SURGERY     x 3   REDUCTION MAMMAPLASTY Bilateral 2005   TOTAL HIP ARTHROPLASTY Left 09/21/2023   Procedure: ARTHROPLASTY, HIP, TOTAL, ANTERIOR APPROACH;  Surgeon: Lorelle Hussar, MD;  Location: [facility] ORS;  Service: Orthopedics;  Laterality: Left;   Social History   Socioeconomic History   Marital status: Single    Spouse name: Not on file   Number of children: Not on file   Years of education: Not on file   Highest education level: Not on file  Occupational History   Not on file  Tobacco Use   Smoking status: Never   Smokeless tobacco: Never  Vaping Use   Vaping status: Never Used  Substance and Sexual Activity   Alcohol use: No   Drug use: No   Sexual activity: Not on file  Other Topics Concern   Not on file  Social History Narrative   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    Social Drivers of Health   Financial Resource Strain: Medium Risk (10/02/2023)   Received from [facility]   Overall Financial Resource Strain (CARDIA)    Difficulty of Paying Living Expenses: Somewhat hard  Food Insecurity: Food Insecurity Present (10/02/2023)   Received from [facility]   Hunger Vital Sign    Within the past 12 months, you worried that your food would run out before you got the money to buy more.: Sometimes true    Within the past 12 months, the food you bought just didn't last and you didn't have money to get more.: Sometimes true  Transportation Needs: No Transportation Needs (10/02/2023)   Received from [facility] - Transportation    In the past 12 months, has lack of transportation kept you from medical appointments or from getting medications?: No    Lack of Transportation (Non-Medical): No  Physical Activity: Not on file  Stress: Not on file  Social Connections: Socially Isolated (08/22/2023)   Social Connection and Isolation Panel    Frequency of Communication with Friends and Family: More than three times a week    Frequency of Social Gatherings with Friends and Family: More than three times a week    Attends Religious Services: Never    Database Administrator or Organizations: No    Attends Engineer, Structural: Never    Marital Status: Never married   Family History  Problem Relation Age of Onset   Breast cancer Mother 80   Breast cancer Cousin 30   Breast cancer Cousin 52   Asthma Father    Asthma Sister    Asthma Child    Allergies  Allergen Reactions   Amoxicillin  Anaphylaxis    Throat swelling  Sob, itching  amoxicillin    Avocado Anaphylaxis   Cephalexin  Hives and Rash   Fexofenadine Shortness Of Breath   Iodinated Contrast Media Hives   Ketorolac  Hives   Morphine Anaphylaxis and Dermatitis   Penicillins Swelling    Throat swelling  Product  containing penicillin  (product)   Phenazopyridine Rash, Dermatitis and Other (See Comments)    phenazopyridine hydrochloride   Promethazine Hives   Diflucan  [Fluconazole ] Rash   Cetirizine Other (See Comments)    Skin eruptures Patient denies   Diclofenac  Sodium Hives   Diclofenac  Sodium Hives   Doxycycline Other (See Comments)   Egg Protein-Containing Drug Products Other (See Comments)    Other reaction(s): RASH  egg extract   Hepatitis B Vaccine Dermatitis   Latex     With condoms   Levofloxacin  Other (See Comments)    levofloxacin    Nsaids Other (See Comments)   Other Hives and Dermatitis    Pain medicine that starts with an L   Tramadol  Hives    Can take with benadryl    Hepatitis B Virus Vaccines Rash   Misc. Sulfonamide Containing Compounds Rash   Morphine And Codeine Rash   Naproxen Rash and Dermatitis   Oxycodone -Acetaminophen  Itching   Pyridium [Phenazopyridine Hcl] Rash   Sulfa Antibiotics Rash and Other (See Comments)   Sulfasalazine Rash and Dermatitis   Vioxx [Rofecoxib] Rash   Prior to Admission medications   Medication Sig Start Date End Date Taking? Authorizing Provider  azithromycin  (ZITHROMAX ) 250 MG tablet as directed. 2 tabs on day one, then 1 tab daily x 4 days 12/19/23 12/24/23 Yes [provider]  cyanocobalamin  (,VITAMIN B-12,) 1000 MCG/ML injection Inject 1,000 mcg into the muscle once a week. Sunday or Monday 01/13/20  Yes [provider]  omeprazole  (PRILOSEC) 40 MG capsule Take 40 mg by mouth daily as needed. 11/28/23  Yes [provider]  predniSONE  (DELTASONE ) 20 MG tablet Take 20 mg by mouth daily. 12/19/23 12/24/23 Yes [provider]  Vitamin D , Ergocalciferol , (DRISDOL ) 1.25 MG (50000 UT) CAPS capsule Take 50,000 Units by mouth once a week. Mondays 11/28/18  Yes [provider]  albuterol  (VENTOLIN  HFA) 108 (90 Base) MCG/ACT inhaler Inhale 1-2 puffs INH Q4-6hr prn for chest tightness, cough, wheezing,  SOB/DOE. Patient not taking: Reported on 12/21/2023 01/10/19   Christopher Savannah, PA-C  diphenhydrAMINE  (BENADRYL ) 25 mg capsule Take 1 capsule (25 mg total) by mouth every 6 (six) hours as needed for itching or allergies. Patient not taking: Reported on 12/21/2023 08/23/23   Amin, Sumayya, MD  EPINEPHrine  (EPIPEN  2-PAK) 0.3 mg/0.3 mL IJ SOAJ injection Inject 0.3 mLs (0.3 mg total) into the muscle as needed for anaphylaxis. Patient not taking:  Reported on 12/21/2023 04/16/19   Meade Verdon RAMAN, MD  esomeprazole  (NEXIUM ) 40 MG capsule Take 1 capsule (40 mg total) by mouth daily at 12 noon. Patient not taking: Reported on 12/21/2023 07/18/22 12/04/23  [place], PA-C  fluconazole  (DIFLUCAN ) 150 MG tablet Oral Patient not taking: Reported on 12/21/2023 01/27/19   [provider]  hyoscyamine  (LEVSIN ) 0.125 MG tablet Oral Patient not taking: Reported on 12/21/2023 01/27/19   [provider]  levocetirizine (XYZAL ) 5 MG tablet Take 5 mg by mouth every morning. Patient not taking: Reported on 12/21/2023    [provider]  levonorgestrel  (MIRENA ) 20 MCG/DAY IUD by Intrauterine route.    [provider]  montelukast  (SINGULAIR ) 10 MG tablet Oral Patient not taking: Reported on 12/21/2023 01/27/19   [provider]  Na Sulfate-K Sulfate-Mg Sulfate concentrate (SUPREP BOWEL PREP KIT) 17.5-3.13-1.6 GM/177ML SOLN Oral Patient not taking: Reported on 12/21/2023 01/27/19   [provider]  nitrofurantoin, macrocrystal-monohydrate, (MACROBID) 100 MG capsule Take 100 mg by mouth 2 (two) times daily. Patient not taking: Reported on 12/21/2023 10/26/23   [provider]  Olopatadine HCl 0.2 % SOLN Ophthalmic Patient not taking: Reported on 12/21/2023 01/27/19   [provider]  pramipexole (MIRAPEX) 0.125 MG tablet Oral Patient not taking: Reported on 12/21/2023 01/27/19   [provider]  sulfamethoxazole-trimethoprim (BACTRIM DS) 800-160 MG tablet Take  1 tablet by mouth 2 (two) times daily. Patient not taking: Reported on 12/21/2023 10/30/23   [provider]  tobramycin -dexamethasone  (TOBRADEX) ophthalmic ointment Ophthalmic Patient not taking: Reported on 12/21/2023 01/27/19   [provider]  valACYclovir (VALTREX) 1000 MG tablet Oral Patient not taking: Reported on 12/21/2023 01/27/19   [provider]  pantoprazole  (PROTONIX ) 40 MG tablet Take 1 tablet (40 mg total) by mouth daily. 04/16/19 12/16/19  Meade Verdon RAMAN, MD   Recent Labs    12/20/23 2008  WBC 9.0  HGB 11.7*  HCT 34.9*  PLT 284  K 3.6  CL 104  CO2 25  BUN 16  CREATININE 0.77  GLUCOSE 105*  CALCIUM 9.0  INR 1.0   CT Lumbar Spine Wo Contrast Result Date: 12/21/2023 EXAM: CT OF THE LUMBAR SPINE WITHOUT CONTRAST 12/20/2023 11:53:51 PM TECHNIQUE: CT of the lumbar spine was performed without the administration of intravenous contrast. Multiplanar reformatted images are provided for review. Automated exposure control, iterative reconstruction, and/or weight based adjustment of the mA/kV was utilized to reduce the radiation dose to as low as reasonably achievable. COMPARISON: None available. CLINICAL HISTORY: Severe left leg pain, possible radiculopathy. FINDINGS: BONES AND ALIGNMENT: Normal vertebral body heights. No acute fracture or suspicious bone lesion. Normal alignment. DEGENERATIVE CHANGES: Intervertebral disc height is preserved. Moderate facet arthrosis, left greater than right at L4-L5. Moderate left and severe right facet arthrosis at L5-S1. Broad-based disc bulge at L3-L4 and L4-L5 with associated laminar hypertrophy resulting in mild central canal stenosis within the subforaminal zone at L3-L4 and moderate central canal stenosis within the subforaminal zone at L4-L5. The spinal canal is otherwise widely patent. No significant neural foraminal narrowing. SOFT TISSUES: Surgical changes of gastric bypass were identified. A 3 mm nonobstructing  calculus centered within the interpolar region of the right kidney. IMPRESSION: 1. Broad-based disc bulge at L3-L4 with associated laminar hypertrophy resulting in mild central canal stenosis within the subforaminal zone. 2. Broad-based disc bulge at L4-L5 with associated laminar hypertrophy resulting in moderate central canal stenosis within the subforaminal zone. 3. Severe right facet arthrosis at L5-S1,  with moderate left facet arthrosis at L5-S1 and moderate facet arthrosis at L4-L5. Electronically signed by: Dorethia Molt MD 12/21/2023 12:05 AM EST RP Workstation: [id number]   CT FEMUR LEFT WO CONTRAST Result Date: 12/21/2023 EXAM: CT OF THE LEFT FEMUR, WITHOUT IV CONTRAST 12/20/2023 11:53:51 PM TECHNIQUE: Axial images were acquired through the left femur without IV contrast. Reformatted images were reviewed. Automated exposure control, iterative reconstruction, and/or weight based adjustment of the mA/kV was utilized to reduce the radiation dose to as low as reasonably achievable. COMPARISON: Comparison with same day x-ray and CT 08/22/2023. CLINICAL HISTORY: Severe left leg pain of unclear etiology, consider muscle hematoma, unlikely occult fracture. FINDINGS: BONES: No acute fracture or focal osseous lesion. Left hip arthroplasty without evidence of loosening. JOINTS: No dislocation. The joint spaces are normal. No knee joint effusion. SOFT TISSUES: Extensive heterogeneous edema and hyperdense fluid within the anterior compartment musculature of the left thigh. The margins are not well defined but the hematoma measures approximately 6.3 x 4.6 x 24.7 cm. Assessment for active bleeding is impossible on noncontrast exam. If there is concern for active bleeding. Consider CTA of the left lower extremity. IMPRESSION: 1. Large hematoma and edema in the left thigh anterior compartment musculature. Clinical assessment for compartment syndrome is recommended. 2. If there is concern for active bleeding, consider CTA  of the left lower extremity. Electronically signed by: Norman Gatlin MD 12/21/2023 12:05 AM EST RP Workstation: [id number]   US  Venous Img Lower Unilateral Left Result Date: 12/20/2023 CLINICAL DATA:  Left leg pain. EXAM: LEFT LOWER EXTREMITY VENOUS DOPPLER ULTRASOUND TECHNIQUE: Gray-scale sonography with graded compression, as well as color Doppler and duplex ultrasound were performed to evaluate the lower extremity deep venous systems from the level of the common femoral vein and including the common femoral, femoral, profunda femoral, popliteal and calf veins including the posterior tibial, peroneal and gastrocnemius veins when visible. The superficial great saphenous vein was also interrogated. Spectral Doppler was utilized to evaluate flow at rest and with distal augmentation maneuvers in the common femoral, femoral and popliteal veins. COMPARISON:  July 08, 2023 FINDINGS: Contralateral Common Femoral Vein: Respiratory phasicity is normal and symmetric with the symptomatic side. No evidence of thrombus. Normal compressibility. Common Femoral Vein: No evidence of thrombus. Normal compressibility, respiratory phasicity and response to augmentation. Saphenofemoral Junction: No evidence of thrombus. Normal compressibility and flow on color Doppler imaging. Profunda Femoral Vein: No evidence of thrombus. Normal compressibility and flow on color Doppler imaging. Femoral Vein: No evidence of thrombus. Normal compressibility, respiratory phasicity and response to augmentation. Popliteal Vein: No evidence of thrombus. Normal compressibility, respiratory phasicity and response to augmentation. Calf Veins: No evidence of thrombus. Normal compressibility and flow on color Doppler imaging. Superficial Great Saphenous Vein: No evidence of thrombus. Normal compressibility. Venous Reflux:  None. Other Findings:  None. IMPRESSION: No evidence of deep venous thrombosis within the LEFT lower extremity. Electronically Signed    By: Suzen Dials M.D.   On: 12/20/2023 23:41   DG Hip Unilat W or Wo Pelvis 2-3 Views Left Result Date: 12/20/2023 EXAM: 2 or more VIEW(S) XRAY OF THE PELVIS AND LEFT HIP 12/20/2023 07:44:25 PM COMPARISON: None available. CLINICAL HISTORY: L hip pain, left hip replacement about 3 months ago FINDINGS: BONES AND JOINTS: SI joints are symmetric. No acute fracture. Bilateral hips demonstrate normal alignment. Left hip arthroplasty noted. Intact and well seated hardware. Surgical clips overlie the right hemipelvis. SOFT TISSUES: IUD. IMPRESSION: 1. Left hip arthroplasty with intact and well-seated  hardware. Electronically signed by: Norman Gatlin MD 12/20/2023 08:46 PM EST RP Workstation: [id number]   DG Femur Min 2 Views Left Result Date: 12/20/2023 EXAM: 2 VIEW(S) XRAY OF THE LEFT FEMUR 12/20/2023 07:44:25 PM COMPARISON: None available. CLINICAL HISTORY: L leg pain FINDINGS: BONES AND JOINTS: Total left hip arthroplasty in place. No acute fracture. SOFT TISSUES: The soft tissues are unremarkable. IMPRESSION: 1. No acute osseous abnormality. Electronically signed by: Norman Gatlin MD 12/20/2023 08:45 PM EST RP Workstation: [id number]     Positive ROS: All other systems have been reviewed and were otherwise negative with the exception of those mentioned in the HPI and as above.  Physical Exam: BP 109/62   Pulse 92   Temp 98 F (36.7 C)   Resp 19   SpO2 98%  General:  Alert, no acute distress Psychiatric:  Patient is competent for consent with normal mood and affect     Orthopedic Exam:  RLE: 5/5 DF/PF/EHL SILT s/s/t/sp/dp distr.  Sensation intact over thigh musculature and leg. + DP/PT pulses RoM knee: 0-30 is relatively pain-free.  Significant pain with knee flexion greater than 30 degrees. Compartments of the thigh are soft and compressible, but anterior compartment is exquisitely painful  Imaging:  As above: Very large hematoma as described above within the anterior thigh  musculature.  No concerning findings on radiographs as hardware appears to be in an appropriate position.  DVT scan was negative.  Assessment/Plan: 48 year old female 3 months s/p left THA who had been doing quite well until she appears to have developed a spontaneous left anterior thigh hematoma approximately 1 week ago with significant worsening today.  Current clinical exam is not consistent with compartment syndrome. 1.  We discussed the diagnosis of large intramuscular hematoma that was likely causing her symptoms.  Based on her clinical course, there may have been dramatic enlargement of her hematoma today.  If possible, it may be beneficial to obtain a CTA to evaluate for any intramuscular bleeding source.  Patient states that her allergy is relatively mild and would be willing to undergo study.  2.  We also discussed the possibility of surgical evacuation of the left thigh hematoma.  Patient was amenable to this as opposed to continuing to monitor her symptoms given her current functional status.  We can plan to keep the patient n.p.o. for possible surgical intervention later today.  3.  I will also plan to discuss the patient with Dr. Lorelle later this morning.  4.  Please page if there is any significant worsening in the patient's symptoms/pain level that is not responsive to appropriate medications.    Earnestine Blanch   12/21/2023 1:52 AM

## 2023-12-21 NOTE — Transfer of Care (Signed)
 Immediate Anesthesia Transfer of Care Note  Patient: Debbie Sosa  Procedure(s) Performed: LEFT HIP EXAMINATION UNDER ANESTHESIA WITH ATTEMPTED FLUOROSCOPIC GUIDED ASPIRATION, LEFT THIGH ULTRASOUND (Left: Hip)  Patient Location: PACU  Anesthesia Type:General  Level of Consciousness: awake, alert , and patient cooperative  Airway & Oxygen Therapy: Patient Spontanous Breathing  Post-op Assessment: Report given to RN and Post -op Vital signs reviewed and stable  Post vital signs: Reviewed and stable  Last Vitals:  Vitals Value Taken Time  BP 151/97 12/21/23 16:49  Temp    Pulse 88 12/21/23 16:51  Resp 13 12/21/23 16:51  SpO2 100 % 12/21/23 16:51  Vitals shown include unfiled device data.  Last Pain:  Vitals:   12/21/23 1412  TempSrc: Temporal  PainSc: 6          Complications: No notable events documented.

## 2023-12-21 NOTE — Progress Notes (Signed)
  PROGRESS NOTE  Debbie Sosa FMW:[id number] DOB: 06-22-1975 DOA: 12/20/2023 PCP: Auston Reyes BIRCH, MD   LOS: 0 days   Brief narrative:  Debbie Sosa is a 48 y.o. female with medical history significant for Rheumatoid arthritis, anemia with iron  deficiency and vitamin B12 deficiency, s/p gastric bypass, lymphedema, prior DVT/PE, lumbar radiculopathy, multiple medication allergies, who underwent left hip arthroplasty 09/21/2023 and had completed 4 weeks of prophylactic Lovenox  in early October presented to hospital with left thigh sudden onset of pain while walking with difficulty weightbearing with numbness of the left calf and foot .  She states that she noticed in the previous week she seemed to be having more difficulty at her physical therapy sessions and also noted that her pants leg was fitting more tightly tighter on the left thigh than the right.  In the ED vitals were stable.  Hemoglobin was 11.3.  CT scan of the femur showed large hematoma and edema in the left thigh and the anterior compartment musculature.  CT lumbar spine showed DJD.  Lower extremity DVT was negative for DVT.  Patient was treated with fentanyl  Tylenol  Flexeril.  Orthopedics was consulted and patient was considered for admission to the hospital for further evaluation and treatment.  Patient seen and examined at bedside.  Patient was admitted to the hospital for left hip pain  At the time of my evaluation, patient complains of left thigh pain and swelling   Physical examination reveals average built female with large tender left thigh swelling.  Laboratory data and imaging was reviewed  Assessment and Plan.   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  Orthopedics has been consulted for possible compartment syndrome.  CTA has been requested.  Since patient has history of hives with iodinated  contrast undergoing preparation.  Orthopedic following and might need surgical intervention/fasciotomy.   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  Will continue to monitor    Multiple drug allergies On EpiPen  Monitor for allergies.  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  Follow-up with rheumatology as outpatient.   Lymphedema, chronic Continue supportive care with compressive stockings.   History of gastric bypass History of iron  deficiency/vitamin B12 deficiency. Previously on Venofer .  Hemoglobin of 11.3 today.  Continue to monitor in the setting of large hematoma.  No Charge  Signed,  Vernal Anselm Alstrom, MD [facility]

## 2023-12-21 NOTE — Assessment & Plan Note (Signed)
 History of IDA/B12 deficiency Previously on Venofer  Hemoglobin 11.3, down from 12.3 about 3 weeks prior Continue to monitor in the setting of large hematoma

## 2023-12-21 NOTE — TOC CM/SW Note (Signed)
 Transition of Care [facility]) CM/SW Note    Transition of Care [facility]) - Inpatient Brief Assessment   Patient Details  Name: Debbie Sosa MRN: [id number] Date of Birth: 12/25/1975  Transition of Care [facility]) CM/SW Contact:    Alfonso Rummer, LCSW Phone Number: 12/21/2023, 8:44 AM   Clinical Narrative:  Completed toc chart review. No toc needs identified please contact toc should needs arise.   Transition of Care Asessment: Insurance and Status: Insurance coverage has been reviewed Patient has primary care physician: Yes (SPARKS, JEFFREY D) Home environment has been reviewed: single family home Prior level of function:: independent Prior/Current Home Services: No current home services Social Drivers of Health Review: SDOH reviewed no interventions necessary Readmission risk has been reviewed: No Transition of care needs: no transition of care needs at this time

## 2023-12-21 NOTE — Assessment & Plan Note (Addendum)
 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 Earnestine Blanch due to concerns of possible compartment syndrome-will come into see patient Pain control--monitor for allergies-history of multiple Serial H&H Addendum: Discussed with Dr. Earnestine Blanch in person who came in to evaluate patient-recommending CTA as suggested by radiologist.  Will premedicate patient-history of hives with prior contrast administration

## 2023-12-21 NOTE — Assessment & Plan Note (Signed)
 Chronic degenerative changes seen on CT lumbar spine

## 2023-12-21 NOTE — H&P (View-Only) (Signed)
 ORTHOPAEDIC CONSULTATION  REQUESTING PHYSICIAN: Cleatus Delayne GAILS, MD  Chief Complaint:   L thigh pain  History of Present Illness: Debbie Sosa is a 48 y.o. female who underwent left THA by Dr. Lorelle 3 months ago.  She had been recovering relatively well postoperatively until approximately 1 week ago.  She states at that time, she noted some increased pain with physical therapy and difficulty performing leg presses and knee flexion exercises.  This slightly worsened over the past week untilthis afternoon when her pain became quite severe when she was just walking at school today.  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  She was unable to ambulate afterwards and EMS brought her over here to our ED.  She denies any specific traumatic incident.  The only possible trauma she may have had is that her 20 pound granddaughter was crawling on her lap approximately 1 week ago.  In the emergency department, she was having severe pain about the entirety of the left thigh.  It is been quite sensitive to touch.  She is having difficulty with knee flexion and extension and has been unable to bear weight.  She was given fentanyl  and most recently oxycodone .  She states that oxycodone  has eased her pain somewhat.  She was having some numbness sensations in the left foot, but this has resolved.  Of note, she has a history of prior PE and had an IVC filter placed for the surgery.  It was removed approximately 6 weeks ago.  DVT scan in the emergency department was negative. Past Medical History:  Diagnosis Date   Anemia    Antiphospholipid antibody positive    Arthritis    Asthma    seasonal   Chronic venous insufficiency    COVID-19 2020   DDD (degenerative disc disease), lumbar    GERD (gastroesophageal reflux disease)    Headache    h/o migraines   History of hiatal hernia    repaired with Gastric bypass   History  of kidney stones    Iron  deficiency    recieves iron  infusions every 3 months at the cancer center   Lymphedema    bil legs-sees [place] [facility]   Nausea and vomiting 10/14/2015   Obesity    Pneumonia    Pulmonary embolism (HCC)    after 2nd c-section   Sleep apnea    had gastric bypass and does not need to use cpap since surgery   Vitamin B12 deficiency    Past Surgical History:  Procedure Laterality Date   BREAST BIOPSY Left 2006   benign/clip   BREAST BIOPSY Right 2015   benign/clip   CESAREAN SECTION     CESAREAN SECTION     CHOLECYSTECTOMY     COLONOSCOPY     COLONOSCOPY WITH PROPOFOL  N/A 10/31/2018   Procedure: COLONOSCOPY WITH PROPOFOL ;  Surgeon: Therisa Bi, MD;  Location: [facility] ENDOSCOPY;  Service: Gastroenterology;  Laterality: N/A;   ESOPHAGOGASTRODUODENOSCOPY (EGD) WITH PROPOFOL  N/A 10/04/2016   Procedure: ESOPHAGOGASTRODUODENOSCOPY (EGD) WITH PROPOFOL ;  Surgeon: Therisa Bi, MD;  Location: [facility] ENDOSCOPY;  Service: Gastroenterology;  Laterality: N/A;   ESOPHAGOGASTRODUODENOSCOPY (EGD) WITH PROPOFOL  N/A 04/14/2017   Procedure: ESOPHAGOGASTRODUODENOSCOPY (EGD) WITH PROPOFOL ;  Surgeon: Therisa Bi, MD;  Location: [facility] ENDOSCOPY;  Service: Gastroenterology;  Laterality: N/A;   ESOPHAGOGASTRODUODENOSCOPY (EGD) WITH PROPOFOL  N/A 10/31/2018   Procedure: ESOPHAGOGASTRODUODENOSCOPY (EGD) WITH PROPOFOL ;  Surgeon: Therisa Bi, MD;  Location: [facility] ENDOSCOPY;  Service: Gastroenterology;  Laterality: N/A;   ESOPHAGOGASTRODUODENOSCOPY (EGD) WITH  PROPOFOL  N/A 06/28/2019   Procedure: ESOPHAGOGASTRODUODENOSCOPY (EGD) WITH PROPOFOL ;  Surgeon: Therisa Bi, MD;  Location: [facility] ENDOSCOPY;  Service: Gastroenterology;  Laterality: N/A;   ESOPHAGOGASTRODUODENOSCOPY (EGD) WITH PROPOFOL  N/A 02/10/2021   Procedure: ESOPHAGOGASTRODUODENOSCOPY (EGD) WITH PROPOFOL ;  Surgeon: Therisa Bi, MD;  Location: [facility] ENDOSCOPY;  Service: Gastroenterology;  Laterality: N/A;   GASTRIC BYPASS     01/2015   HERNIA  REPAIR     repaired with gastric bypass - Hiatel hernia   HYSTEROSCOPY WITH D & C N/A 09/17/2021   Procedure: DILATATION AND CURETTAGE /HYSTEROSCOPY;  Surgeon: Leonce Garnette BIRCH, MD;  Location: [facility] ORS;  Service: Gynecology;  Laterality: N/A;   INTRAUTERINE DEVICE (IUD) INSERTION N/A 09/17/2021   Procedure: INTRAUTERINE DEVICE (IUD) INSERTION - MIRENA ;  Surgeon: Leonce Garnette BIRCH, MD;  Location: [facility] ORS;  Service: Gynecology;  Laterality: N/A;   IUD REMOVAL N/A 09/17/2021   Procedure: INTRAUTERINE DEVICE (IUD) REMOVAL;  Surgeon: Leonce Garnette BIRCH, MD;  Location: [facility] ORS;  Service: Gynecology;  Laterality: N/A;   IVC FILTER INSERTION N/A 09/19/2023   Procedure: IVC FILTER INSERTION;  Surgeon: Jama Cordella MATSU, MD;  Location: [facility] INVASIVE CV LAB;  Service: Cardiovascular;  Laterality: N/A;   IVC FILTER REMOVAL N/A 11/07/2023   Procedure: IVC FILTER REMOVAL;  Surgeon: Jama Cordella MATSU, MD;  Location: [facility] INVASIVE CV LAB;  Service: Cardiovascular;  Laterality: N/A;   KNEE SURGERY Right    NASAL SINUS SURGERY     PLANTAR FASCIA SURGERY     x 3   REDUCTION MAMMAPLASTY Bilateral 2005   TOTAL HIP ARTHROPLASTY Left 09/21/2023   Procedure: ARTHROPLASTY, HIP, TOTAL, ANTERIOR APPROACH;  Surgeon: Lorelle Hussar, MD;  Location: [facility] ORS;  Service: Orthopedics;  Laterality: Left;   Social History   Socioeconomic History   Marital status: Single    Spouse name: Not on file   Number of children: Not on file   Years of education: Not on file   Highest education level: Not on file  Occupational History   Not on file  Tobacco Use   Smoking status: Never   Smokeless tobacco: Never  Vaping Use   Vaping status: Never Used  Substance and Sexual Activity   Alcohol use: No   Drug use: No   Sexual activity: Not on file  Other Topics Concern   Not on file  Social History Narrative   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    Social Drivers of Health   Financial Resource Strain: Medium Risk (10/02/2023)   Received from [facility]   Overall Financial Resource Strain (CARDIA)    Difficulty of Paying Living Expenses: Somewhat hard  Food Insecurity: Food Insecurity Present (10/02/2023)   Received from [facility]   Hunger Vital Sign    Within the past 12 months, you worried that your food would run out before you got the money to buy more.: Sometimes true    Within the past 12 months, the food you bought just didn't last and you didn't have money to get more.: Sometimes true  Transportation Needs: No Transportation Needs (10/02/2023)   Received from [facility] - Transportation    In the past 12 months, has lack of transportation kept you from medical appointments or from getting medications?: No    Lack of Transportation (Non-Medical): No  Physical Activity: Not on file  Stress: Not on file  Social Connections: Socially Isolated (08/22/2023)   Social Connection and Isolation Panel    Frequency of Communication with Friends and Family: More than three times a week    Frequency of Social Gatherings with Friends and Family: More than three times a week    Attends Religious Services: Never    Database Administrator or Organizations: No    Attends Engineer, Structural: Never    Marital Status: Never married   Family History  Problem Relation Age of Onset   Breast cancer Mother 63   Breast cancer Cousin 30   Breast cancer Cousin 52   Asthma Father    Asthma Sister    Asthma Child    Allergies  Allergen Reactions   Amoxicillin  Anaphylaxis    Throat swelling  Sob, itching  amoxicillin    Avocado Anaphylaxis   Cephalexin  Hives and Rash   Fexofenadine Shortness Of Breath   Iodinated Contrast Media Hives   Ketorolac  Hives   Morphine Anaphylaxis and Dermatitis   Penicillins Swelling    Throat swelling  Product  containing penicillin  (product)   Phenazopyridine Rash, Dermatitis and Other (See Comments)    phenazopyridine hydrochloride   Promethazine Hives   Diflucan  [Fluconazole ] Rash   Cetirizine Other (See Comments)    Skin eruptures Patient denies   Diclofenac  Sodium Hives   Diclofenac  Sodium Hives   Doxycycline Other (See Comments)   Egg Protein-Containing Drug Products Other (See Comments)    Other reaction(s): RASH  egg extract   Hepatitis B Vaccine Dermatitis   Latex     With condoms   Levofloxacin  Other (See Comments)    levofloxacin    Nsaids Other (See Comments)   Other Hives and Dermatitis    Pain medicine that starts with an L   Tramadol  Hives    Can take with benadryl    Hepatitis B Virus Vaccines Rash   Misc. Sulfonamide Containing Compounds Rash   Morphine And Codeine Rash   Naproxen Rash and Dermatitis   Oxycodone -Acetaminophen  Itching   Pyridium [Phenazopyridine Hcl] Rash   Sulfa Antibiotics Rash and Other (See Comments)   Sulfasalazine Rash and Dermatitis   Vioxx [Rofecoxib] Rash   Prior to Admission medications   Medication Sig Start Date End Date Taking? Authorizing Provider  azithromycin  (ZITHROMAX ) 250 MG tablet as directed. 2 tabs on day one, then 1 tab daily x 4 days 12/19/23 12/24/23 Yes [provider]  cyanocobalamin  (,VITAMIN B-12,) 1000 MCG/ML injection Inject 1,000 mcg into the muscle once a week. Sunday or Monday 01/13/20  Yes [provider]  omeprazole  (PRILOSEC) 40 MG capsule Take 40 mg by mouth daily as needed. 11/28/23  Yes [provider]  predniSONE  (DELTASONE ) 20 MG tablet Take 20 mg by mouth daily. 12/19/23 12/24/23 Yes [provider]  Vitamin D , Ergocalciferol , (DRISDOL ) 1.25 MG (50000 UT) CAPS capsule Take 50,000 Units by mouth once a week. Mondays 11/28/18  Yes [provider]  albuterol  (VENTOLIN  HFA) 108 (90 Base) MCG/ACT inhaler Inhale 1-2 puffs INH Q4-6hr prn for chest tightness, cough, wheezing,  SOB/DOE. Patient not taking: Reported on 12/21/2023 01/10/19   Christopher Savannah, PA-C  diphenhydrAMINE  (BENADRYL ) 25 mg capsule Take 1 capsule (25 mg total) by mouth every 6 (six) hours as needed for itching or allergies. Patient not taking: Reported on 12/21/2023 08/23/23   Amin, Sumayya, MD  EPINEPHrine  (EPIPEN  2-PAK) 0.3 mg/0.3 mL IJ SOAJ injection Inject 0.3 mLs (0.3 mg total) into the muscle as needed for anaphylaxis. Patient not taking:  Reported on 12/21/2023 04/16/19   Meade Verdon RAMAN, MD  esomeprazole  (NEXIUM ) 40 MG capsule Take 1 capsule (40 mg total) by mouth daily at 12 noon. Patient not taking: Reported on 12/21/2023 07/18/22 12/04/23  [place], PA-C  fluconazole  (DIFLUCAN ) 150 MG tablet Oral Patient not taking: Reported on 12/21/2023 01/27/19   [provider]  hyoscyamine  (LEVSIN ) 0.125 MG tablet Oral Patient not taking: Reported on 12/21/2023 01/27/19   [provider]  levocetirizine (XYZAL ) 5 MG tablet Take 5 mg by mouth every morning. Patient not taking: Reported on 12/21/2023    [provider]  levonorgestrel  (MIRENA ) 20 MCG/DAY IUD by Intrauterine route.    [provider]  montelukast  (SINGULAIR ) 10 MG tablet Oral Patient not taking: Reported on 12/21/2023 01/27/19   [provider]  Na Sulfate-K Sulfate-Mg Sulfate concentrate (SUPREP BOWEL PREP KIT) 17.5-3.13-1.6 GM/177ML SOLN Oral Patient not taking: Reported on 12/21/2023 01/27/19   [provider]  nitrofurantoin, macrocrystal-monohydrate, (MACROBID) 100 MG capsule Take 100 mg by mouth 2 (two) times daily. Patient not taking: Reported on 12/21/2023 10/26/23   [provider]  Olopatadine HCl 0.2 % SOLN Ophthalmic Patient not taking: Reported on 12/21/2023 01/27/19   [provider]  pramipexole (MIRAPEX) 0.125 MG tablet Oral Patient not taking: Reported on 12/21/2023 01/27/19   [provider]  sulfamethoxazole-trimethoprim (BACTRIM DS) 800-160 MG tablet Take  1 tablet by mouth 2 (two) times daily. Patient not taking: Reported on 12/21/2023 10/30/23   [provider]  tobramycin -dexamethasone  (TOBRADEX) ophthalmic ointment Ophthalmic Patient not taking: Reported on 12/21/2023 01/27/19   [provider]  valACYclovir (VALTREX) 1000 MG tablet Oral Patient not taking: Reported on 12/21/2023 01/27/19   [provider]  pantoprazole  (PROTONIX ) 40 MG tablet Take 1 tablet (40 mg total) by mouth daily. 04/16/19 12/16/19  Meade Verdon RAMAN, MD   Recent Labs    12/20/23 2008  WBC 9.0  HGB 11.7*  HCT 34.9*  PLT 284  K 3.6  CL 104  CO2 25  BUN 16  CREATININE 0.77  GLUCOSE 105*  CALCIUM 9.0  INR 1.0   CT Lumbar Spine Wo Contrast Result Date: 12/21/2023 EXAM: CT OF THE LUMBAR SPINE WITHOUT CONTRAST 12/20/2023 11:53:51 PM TECHNIQUE: CT of the lumbar spine was performed without the administration of intravenous contrast. Multiplanar reformatted images are provided for review. Automated exposure control, iterative reconstruction, and/or weight based adjustment of the mA/kV was utilized to reduce the radiation dose to as low as reasonably achievable. COMPARISON: None available. CLINICAL HISTORY: Severe left leg pain, possible radiculopathy. FINDINGS: BONES AND ALIGNMENT: Normal vertebral body heights. No acute fracture or suspicious bone lesion. Normal alignment. DEGENERATIVE CHANGES: Intervertebral disc height is preserved. Moderate facet arthrosis, left greater than right at L4-L5. Moderate left and severe right facet arthrosis at L5-S1. Broad-based disc bulge at L3-L4 and L4-L5 with associated laminar hypertrophy resulting in mild central canal stenosis within the subforaminal zone at L3-L4 and moderate central canal stenosis within the subforaminal zone at L4-L5. The spinal canal is otherwise widely patent. No significant neural foraminal narrowing. SOFT TISSUES: Surgical changes of gastric bypass were identified. A 3 mm nonobstructing  calculus centered within the interpolar region of the right kidney. IMPRESSION: 1. Broad-based disc bulge at L3-L4 with associated laminar hypertrophy resulting in mild central canal stenosis within the subforaminal zone. 2. Broad-based disc bulge at L4-L5 with associated laminar hypertrophy resulting in moderate central canal stenosis within the subforaminal zone. 3. Severe right facet arthrosis at L5-S1,  with moderate left facet arthrosis at L5-S1 and moderate facet arthrosis at L4-L5. Electronically signed by: Dorethia Molt MD 12/21/2023 12:05 AM EST RP Workstation: [id number]   CT FEMUR LEFT WO CONTRAST Result Date: 12/21/2023 EXAM: CT OF THE LEFT FEMUR, WITHOUT IV CONTRAST 12/20/2023 11:53:51 PM TECHNIQUE: Axial images were acquired through the left femur without IV contrast. Reformatted images were reviewed. Automated exposure control, iterative reconstruction, and/or weight based adjustment of the mA/kV was utilized to reduce the radiation dose to as low as reasonably achievable. COMPARISON: Comparison with same day x-ray and CT 08/22/2023. CLINICAL HISTORY: Severe left leg pain of unclear etiology, consider muscle hematoma, unlikely occult fracture. FINDINGS: BONES: No acute fracture or focal osseous lesion. Left hip arthroplasty without evidence of loosening. JOINTS: No dislocation. The joint spaces are normal. No knee joint effusion. SOFT TISSUES: Extensive heterogeneous edema and hyperdense fluid within the anterior compartment musculature of the left thigh. The margins are not well defined but the hematoma measures approximately 6.3 x 4.6 x 24.7 cm. Assessment for active bleeding is impossible on noncontrast exam. If there is concern for active bleeding. Consider CTA of the left lower extremity. IMPRESSION: 1. Large hematoma and edema in the left thigh anterior compartment musculature. Clinical assessment for compartment syndrome is recommended. 2. If there is concern for active bleeding, consider CTA  of the left lower extremity. Electronically signed by: Norman Gatlin MD 12/21/2023 12:05 AM EST RP Workstation: [id number]   US  Venous Img Lower Unilateral Left Result Date: 12/20/2023 CLINICAL DATA:  Left leg pain. EXAM: LEFT LOWER EXTREMITY VENOUS DOPPLER ULTRASOUND TECHNIQUE: Gray-scale sonography with graded compression, as well as color Doppler and duplex ultrasound were performed to evaluate the lower extremity deep venous systems from the level of the common femoral vein and including the common femoral, femoral, profunda femoral, popliteal and calf veins including the posterior tibial, peroneal and gastrocnemius veins when visible. The superficial great saphenous vein was also interrogated. Spectral Doppler was utilized to evaluate flow at rest and with distal augmentation maneuvers in the common femoral, femoral and popliteal veins. COMPARISON:  July 08, 2023 FINDINGS: Contralateral Common Femoral Vein: Respiratory phasicity is normal and symmetric with the symptomatic side. No evidence of thrombus. Normal compressibility. Common Femoral Vein: No evidence of thrombus. Normal compressibility, respiratory phasicity and response to augmentation. Saphenofemoral Junction: No evidence of thrombus. Normal compressibility and flow on color Doppler imaging. Profunda Femoral Vein: No evidence of thrombus. Normal compressibility and flow on color Doppler imaging. Femoral Vein: No evidence of thrombus. Normal compressibility, respiratory phasicity and response to augmentation. Popliteal Vein: No evidence of thrombus. Normal compressibility, respiratory phasicity and response to augmentation. Calf Veins: No evidence of thrombus. Normal compressibility and flow on color Doppler imaging. Superficial Great Saphenous Vein: No evidence of thrombus. Normal compressibility. Venous Reflux:  None. Other Findings:  None. IMPRESSION: No evidence of deep venous thrombosis within the LEFT lower extremity. Electronically Signed    By: Suzen Dials M.D.   On: 12/20/2023 23:41   DG Hip Unilat W or Wo Pelvis 2-3 Views Left Result Date: 12/20/2023 EXAM: 2 or more VIEW(S) XRAY OF THE PELVIS AND LEFT HIP 12/20/2023 07:44:25 PM COMPARISON: None available. CLINICAL HISTORY: L hip pain, left hip replacement about 3 months ago FINDINGS: BONES AND JOINTS: SI joints are symmetric. No acute fracture. Bilateral hips demonstrate normal alignment. Left hip arthroplasty noted. Intact and well seated hardware. Surgical clips overlie the right hemipelvis. SOFT TISSUES: IUD. IMPRESSION: 1. Left hip arthroplasty with intact and well-seated  hardware. Electronically signed by: Norman Gatlin MD 12/20/2023 08:46 PM EST RP Workstation: [id number]   DG Femur Min 2 Views Left Result Date: 12/20/2023 EXAM: 2 VIEW(S) XRAY OF THE LEFT FEMUR 12/20/2023 07:44:25 PM COMPARISON: None available. CLINICAL HISTORY: L leg pain FINDINGS: BONES AND JOINTS: Total left hip arthroplasty in place. No acute fracture. SOFT TISSUES: The soft tissues are unremarkable. IMPRESSION: 1. No acute osseous abnormality. Electronically signed by: Norman Gatlin MD 12/20/2023 08:45 PM EST RP Workstation: [id number]     Positive ROS: All other systems have been reviewed and were otherwise negative with the exception of those mentioned in the HPI and as above.  Physical Exam: BP 109/62   Pulse 92   Temp 98 F (36.7 C)   Resp 19   SpO2 98%  General:  Alert, no acute distress Psychiatric:  Patient is competent for consent with normal mood and affect     Orthopedic Exam:  RLE: 5/5 DF/PF/EHL SILT s/s/t/sp/dp distr.  Sensation intact over thigh musculature and leg. + DP/PT pulses RoM knee: 0-30 is relatively pain-free.  Significant pain with knee flexion greater than 30 degrees. Compartments of the thigh are soft and compressible, but anterior compartment is exquisitely painful  Imaging:  As above: Very large hematoma as described above within the anterior thigh  musculature.  No concerning findings on radiographs as hardware appears to be in an appropriate position.  DVT scan was negative.  Assessment/Plan: 48 year old female 3 months s/p left THA who had been doing quite well until she appears to have developed a spontaneous left anterior thigh hematoma approximately 1 week ago with significant worsening today.  Current clinical exam is not consistent with compartment syndrome. 1.  We discussed the diagnosis of large intramuscular hematoma that was likely causing her symptoms.  Based on her clinical course, there may have been dramatic enlargement of her hematoma today.  If possible, it may be beneficial to obtain a CTA to evaluate for any intramuscular bleeding source.  Patient states that her allergy is relatively mild and would be willing to undergo study.  2.  We also discussed the possibility of surgical evacuation of the left thigh hematoma.  Patient was amenable to this as opposed to continuing to monitor her symptoms given her current functional status.  We can plan to keep the patient n.p.o. for possible surgical intervention later today.  3.  I will also plan to discuss the patient with Dr. Lorelle later this morning.  4.  Please page if there is any significant worsening in the patient's symptoms/pain level that is not responsive to appropriate medications.    Earnestine Blanch   12/21/2023 1:52 AM

## 2023-12-21 NOTE — ED Notes (Signed)
 ED TO INPATIENT HANDOFF REPORT  ED Nurse Name and Phone #: [phone]  S Name/Age/Gender Debbie Sosa 48 y.o. female Room/Bed: ED14A/ED14A  Code Status   Code Status: Full Code  Home/SNF/Other Home Patient oriented to: self, place, time, and situation Is this baseline? Yes   Triage Complete: Triage complete  Chief Complaint Intramuscular hematoma [T14.8XXA]  Triage Note Brought by [facility], Complaints f Left hip pain became worse today, started sudden, severe 10 out 10 pain on left hip  Left hip replacement 90 days ago, had some pain before however never this bad,. Unable to walk Hx of blood cloth, currently taking prednisone  and antibiotics for sinus infection.   Reported Numbness on left calf and foot, stroke scale negative for [facility], paresthesia present    50mg  IM of Ketorolac  on transport   Allergies Allergies  Allergen Reactions   Amoxicillin  Anaphylaxis    Throat swelling  Sob, itching  amoxicillin    Avocado Anaphylaxis   Cephalexin  Hives and Rash   Fexofenadine Shortness Of Breath   Iodinated Contrast Media Hives   Ketorolac  Hives   Morphine Anaphylaxis and Dermatitis   Penicillins Swelling    Throat swelling  Product containing penicillin  (product)   Phenazopyridine Rash, Dermatitis and Other (See Comments)    phenazopyridine hydrochloride   Promethazine Hives   Diflucan  [Fluconazole ] Rash   Cetirizine Other (See Comments)    Skin eruptures Patient denies   Diclofenac  Sodium Hives   Diclofenac  Sodium Hives   Doxycycline Other (See Comments)   Egg Protein-Containing Drug Products Other (See Comments)    Other reaction(s): RASH  egg extract   Hepatitis B Vaccine Dermatitis   Latex     With condoms   Levofloxacin  Other (See Comments)    levofloxacin    Nsaids Other (See Comments)   Other Hives and Dermatitis    Pain medicine that starts with an L   Tramadol  Hives    Can take with benadryl    Hepatitis B Virus Vaccines  Rash   Misc. Sulfonamide Containing Compounds Rash   Morphine And Codeine Rash   Naproxen Rash and Dermatitis   Oxycodone -Acetaminophen  Itching   Pyridium [Phenazopyridine Hcl] Rash   Sulfa Antibiotics Rash and Other (See Comments)   Sulfasalazine Rash and Dermatitis   Vioxx [Rofecoxib] Rash    Level of Care/Admitting Diagnosis ED Disposition     ED Disposition  Admit   Condition  --   Comment  Hospital Area: [facility] [[id number]]  Level of Care: Med-Surg [16]  Diagnosis: Intramuscular hematoma [720713]  Admitting Physician: CLEATUS DELAYNE GAILS [[id number]]  Attending Physician: CLEATUS DELAYNE GAILS [[id number]]          B Medical/Surgery History Past Medical History:  Diagnosis Date   Anemia    Antiphospholipid antibody positive    Arthritis    Asthma    seasonal   Chronic venous insufficiency    COVID-19 2020   DDD (degenerative disc disease), lumbar    GERD (gastroesophageal reflux disease)    Headache    h/o migraines   History of hiatal hernia    repaired with Gastric bypass   History of kidney stones    Iron  deficiency    recieves iron  infusions every 3 months at the cancer center   Lymphedema    bil legs-sees [place] [facility]   Nausea and vomiting 10/14/2015   Obesity    Pneumonia    Pulmonary embolism (HCC)    after 2nd c-section   Sleep  apnea    had gastric bypass and does not need to use cpap since surgery   Vitamin B12 deficiency    Past Surgical History:  Procedure Laterality Date   BREAST BIOPSY Left 2006   benign/clip   BREAST BIOPSY Right 2015   benign/clip   CESAREAN SECTION     CESAREAN SECTION     CHOLECYSTECTOMY     COLONOSCOPY     COLONOSCOPY WITH PROPOFOL  N/A 10/31/2018   Procedure: COLONOSCOPY WITH PROPOFOL ;  Surgeon: Therisa Bi, MD;  Location: [facility] ENDOSCOPY;  Service: Gastroenterology;  Laterality: N/A;   ESOPHAGOGASTRODUODENOSCOPY (EGD) WITH PROPOFOL  N/A 10/04/2016   Procedure: ESOPHAGOGASTRODUODENOSCOPY (EGD) WITH  PROPOFOL ;  Surgeon: Therisa Bi, MD;  Location: [facility] ENDOSCOPY;  Service: Gastroenterology;  Laterality: N/A;   ESOPHAGOGASTRODUODENOSCOPY (EGD) WITH PROPOFOL  N/A 04/14/2017   Procedure: ESOPHAGOGASTRODUODENOSCOPY (EGD) WITH PROPOFOL ;  Surgeon: Therisa Bi, MD;  Location: [facility] ENDOSCOPY;  Service: Gastroenterology;  Laterality: N/A;   ESOPHAGOGASTRODUODENOSCOPY (EGD) WITH PROPOFOL  N/A 10/31/2018   Procedure: ESOPHAGOGASTRODUODENOSCOPY (EGD) WITH PROPOFOL ;  Surgeon: Therisa Bi, MD;  Location: [facility] ENDOSCOPY;  Service: Gastroenterology;  Laterality: N/A;   ESOPHAGOGASTRODUODENOSCOPY (EGD) WITH PROPOFOL  N/A 06/28/2019   Procedure: ESOPHAGOGASTRODUODENOSCOPY (EGD) WITH PROPOFOL ;  Surgeon: Therisa Bi, MD;  Location: [facility] ENDOSCOPY;  Service: Gastroenterology;  Laterality: N/A;   ESOPHAGOGASTRODUODENOSCOPY (EGD) WITH PROPOFOL  N/A 02/10/2021   Procedure: ESOPHAGOGASTRODUODENOSCOPY (EGD) WITH PROPOFOL ;  Surgeon: Therisa Bi, MD;  Location: [facility] ENDOSCOPY;  Service: Gastroenterology;  Laterality: N/A;   GASTRIC BYPASS     01/2015   HERNIA REPAIR     repaired with gastric bypass - Hiatel hernia   HYSTEROSCOPY WITH D & C N/A 09/17/2021   Procedure: DILATATION AND CURETTAGE /HYSTEROSCOPY;  Surgeon: Leonce Garnette BIRCH, MD;  Location: [facility] ORS;  Service: Gynecology;  Laterality: N/A;   INTRAUTERINE DEVICE (IUD) INSERTION N/A 09/17/2021   Procedure: INTRAUTERINE DEVICE (IUD) INSERTION - MIRENA ;  Surgeon: Leonce Garnette BIRCH, MD;  Location: [facility] ORS;  Service: Gynecology;  Laterality: N/A;   IUD REMOVAL N/A 09/17/2021   Procedure: INTRAUTERINE DEVICE (IUD) REMOVAL;  Surgeon: Leonce Garnette BIRCH, MD;  Location: [facility] ORS;  Service: Gynecology;  Laterality: N/A;   IVC FILTER INSERTION N/A 09/19/2023   Procedure: IVC FILTER INSERTION;  Surgeon: Jama Cordella MATSU, MD;  Location: [facility] INVASIVE CV LAB;  Service: Cardiovascular;  Laterality: N/A;   IVC FILTER REMOVAL N/A 11/07/2023   Procedure: IVC FILTER REMOVAL;   Surgeon: Jama Cordella MATSU, MD;  Location: [facility] INVASIVE CV LAB;  Service: Cardiovascular;  Laterality: N/A;   KNEE SURGERY Right    NASAL SINUS SURGERY     PLANTAR FASCIA SURGERY     x 3   REDUCTION MAMMAPLASTY Bilateral 2005   TOTAL HIP ARTHROPLASTY Left 09/21/2023   Procedure: ARTHROPLASTY, HIP, TOTAL, ANTERIOR APPROACH;  Surgeon: Lorelle Hussar, MD;  Location: [facility] ORS;  Service: Orthopedics;  Laterality: Left;     A IV Location/Drains/Wounds Patient Lines/Drains/Airways Status     Active Line/Drains/Airways     Name Placement date Placement time Site Days   Peripheral IV 12/20/23 20 G Right Antecubital 12/20/23  2000  Antecubital  1            Intake/Output Last 24 hours No intake or output data in the 24 hours ending 12/21/23 0147  Labs/Imaging Results for orders placed or performed during the hospital encounter of 12/20/23 (from the past 48 hours)  CBC with Differential     Status: Abnormal   Collection Time: 12/20/23  8:08 PM  Result  Value Ref Range   WBC 9.0 4.0 - 10.5 K/uL   RBC 3.89 3.87 - 5.11 MIL/uL   Hemoglobin 11.7 (L) 12.0 - 15.0 g/dL   HCT 65.0 (L) 63.9 - 53.9 %   MCV 89.7 80.0 - 100.0 fL   MCH 30.1 26.0 - 34.0 pg   MCHC 33.5 30.0 - 36.0 g/dL   RDW 87.1 88.4 - 84.4 %   Platelets 284 150 - 400 K/uL   nRBC 0.0 0.0 - 0.2 %   Neutrophils Relative % 67 %   Neutro Abs 6.0 1.7 - 7.7 K/uL   Lymphocytes Relative 22 %   Lymphs Abs 2.0 0.7 - 4.0 K/uL   Monocytes Relative 9 %   Monocytes Absolute 0.8 0.1 - 1.0 K/uL   Eosinophils Relative 0 %   Eosinophils Absolute 0.0 0.0 - 0.5 K/uL   Basophils Relative 1 %   Basophils Absolute 0.1 0.0 - 0.1 K/uL   Immature Granulocytes 1 %   Abs Immature Granulocytes 0.05 0.00 - 0.07 K/uL    Comment: Performed at [facility], [address] [place], [place] [address]  Basic metabolic panel     Status: Abnormal   Collection Time: 12/20/23  8:08 PM  Result Value Ref Range   Sodium 139 135 - 145 mmol/L    Potassium 3.6 3.5 - 5.1 mmol/L   Chloride 104 98 - 111 mmol/L   CO2 25 22 - 32 mmol/L   Glucose, Bld 105 (H) 70 - 99 mg/dL    Comment: Glucose reference range applies only to samples taken after fasting for at least 8 hours.   BUN 16 6 - 20 mg/dL   Creatinine, Ser 9.22 0.44 - 1.00 mg/dL   Calcium 9.0 8.9 - 89.6 mg/dL   GFR, Estimated >39 >39 mL/min    Comment: (NOTE) Calculated using the CKD-EPI Creatinine Equation (2021)    Anion gap 11 5 - 15    Comment: Performed at [facility], [address] [place], [place] [address]  Protime-INR     Status: None   Collection Time: 12/20/23  8:08 PM  Result Value Ref Range   Prothrombin Time 13.6 11.4 - 15.2 seconds   INR 1.0 0.8 - 1.2    Comment: (NOTE) INR goal varies based on device and disease states. Performed at [facility], [address] [place], [place] [address]   Lactic acid, plasma     Status: None   Collection Time: 12/20/23  8:08 PM  Result Value Ref Range   Lactic Acid, Venous 1.6 0.5 - 1.9 mmol/L    Comment: Performed at [facility], [address] [place], [place] [address]  CK     Status: None   Collection Time: 12/20/23  8:08 PM  Result Value Ref Range   Total CK 147 38 - 234 U/L    Comment: Performed at [facility], [address] [place], [place] [address]   CT Lumbar Spine Wo Contrast Result Date: 12/21/2023 EXAM: CT OF THE LUMBAR SPINE WITHOUT CONTRAST 12/20/2023 11:53:51 PM TECHNIQUE: CT of the lumbar spine was performed without the administration of intravenous contrast. Multiplanar reformatted images are provided for review. Automated exposure control, iterative reconstruction, and/or weight based adjustment of the mA/kV was utilized to reduce the radiation dose to as low as reasonably achievable. COMPARISON: None available. CLINICAL HISTORY: Severe left leg pain, possible radiculopathy. FINDINGS: BONES AND ALIGNMENT: Normal vertebral body heights. No acute fracture or  suspicious bone lesion.  Normal alignment. DEGENERATIVE CHANGES: Intervertebral disc height is preserved. Moderate facet arthrosis, left greater than right at L4-L5. Moderate left and severe right facet arthrosis at L5-S1. Broad-based disc bulge at L3-L4 and L4-L5 with associated laminar hypertrophy resulting in mild central canal stenosis within the subforaminal zone at L3-L4 and moderate central canal stenosis within the subforaminal zone at L4-L5. The spinal canal is otherwise widely patent. No significant neural foraminal narrowing. SOFT TISSUES: Surgical changes of gastric bypass were identified. A 3 mm nonobstructing calculus centered within the interpolar region of the right kidney. IMPRESSION: 1. Broad-based disc bulge at L3-L4 with associated laminar hypertrophy resulting in mild central canal stenosis within the subforaminal zone. 2. Broad-based disc bulge at L4-L5 with associated laminar hypertrophy resulting in moderate central canal stenosis within the subforaminal zone. 3. Severe right facet arthrosis at L5-S1, with moderate left facet arthrosis at L5-S1 and moderate facet arthrosis at L4-L5. Electronically signed by: Dorethia Molt MD 12/21/2023 12:05 AM EST RP Workstation: [id number]   CT FEMUR LEFT WO CONTRAST Result Date: 12/21/2023 EXAM: CT OF THE LEFT FEMUR, WITHOUT IV CONTRAST 12/20/2023 11:53:51 PM TECHNIQUE: Axial images were acquired through the left femur without IV contrast. Reformatted images were reviewed. Automated exposure control, iterative reconstruction, and/or weight based adjustment of the mA/kV was utilized to reduce the radiation dose to as low as reasonably achievable. COMPARISON: Comparison with same day x-ray and CT 08/22/2023. CLINICAL HISTORY: Severe left leg pain of unclear etiology, consider muscle hematoma, unlikely occult fracture. FINDINGS: BONES: No acute fracture or focal osseous lesion. Left hip arthroplasty without evidence of loosening. JOINTS: No dislocation. The  joint spaces are normal. No knee joint effusion. SOFT TISSUES: Extensive heterogeneous edema and hyperdense fluid within the anterior compartment musculature of the left thigh. The margins are not well defined but the hematoma measures approximately 6.3 x 4.6 x 24.7 cm. Assessment for active bleeding is impossible on noncontrast exam. If there is concern for active bleeding. Consider CTA of the left lower extremity. IMPRESSION: 1. Large hematoma and edema in the left thigh anterior compartment musculature. Clinical assessment for compartment syndrome is recommended. 2. If there is concern for active bleeding, consider CTA of the left lower extremity. Electronically signed by: Norman Gatlin MD 12/21/2023 12:05 AM EST RP Workstation: [id number]   US  Venous Img Lower Unilateral Left Result Date: 12/20/2023 CLINICAL DATA:  Left leg pain. EXAM: LEFT LOWER EXTREMITY VENOUS DOPPLER ULTRASOUND TECHNIQUE: Gray-scale sonography with graded compression, as well as color Doppler and duplex ultrasound were performed to evaluate the lower extremity deep venous systems from the level of the common femoral vein and including the common femoral, femoral, profunda femoral, popliteal and calf veins including the posterior tibial, peroneal and gastrocnemius veins when visible. The superficial great saphenous vein was also interrogated. Spectral Doppler was utilized to evaluate flow at rest and with distal augmentation maneuvers in the common femoral, femoral and popliteal veins. COMPARISON:  July 08, 2023 FINDINGS: Contralateral Common Femoral Vein: Respiratory phasicity is normal and symmetric with the symptomatic side. No evidence of thrombus. Normal compressibility. Common Femoral Vein: No evidence of thrombus. Normal compressibility, respiratory phasicity and response to augmentation. Saphenofemoral Junction: No evidence of thrombus. Normal compressibility and flow on color Doppler imaging. Profunda Femoral Vein: No evidence of  thrombus. Normal compressibility and flow on color Doppler imaging. Femoral Vein: No evidence of thrombus. Normal compressibility, respiratory phasicity and response to augmentation. Popliteal Vein: No evidence of thrombus. Normal compressibility, respiratory phasicity and response to augmentation. Calf Veins:  No evidence of thrombus. Normal compressibility and flow on color Doppler imaging. Superficial Great Saphenous Vein: No evidence of thrombus. Normal compressibility. Venous Reflux:  None. Other Findings:  None. IMPRESSION: No evidence of deep venous thrombosis within the LEFT lower extremity. Electronically Signed   By: Suzen Dials M.D.   On: 12/20/2023 23:41   DG Hip Unilat W or Wo Pelvis 2-3 Views Left Result Date: 12/20/2023 EXAM: 2 or more VIEW(S) XRAY OF THE PELVIS AND LEFT HIP 12/20/2023 07:44:25 PM COMPARISON: None available. CLINICAL HISTORY: L hip pain, left hip replacement about 3 months ago FINDINGS: BONES AND JOINTS: SI joints are symmetric. No acute fracture. Bilateral hips demonstrate normal alignment. Left hip arthroplasty noted. Intact and well seated hardware. Surgical clips overlie the right hemipelvis. SOFT TISSUES: IUD. IMPRESSION: 1. Left hip arthroplasty with intact and well-seated hardware. Electronically signed by: Norman Gatlin MD 12/20/2023 08:46 PM EST RP Workstation: [id number]   DG Femur Min 2 Views Left Result Date: 12/20/2023 EXAM: 2 VIEW(S) XRAY OF THE LEFT FEMUR 12/20/2023 07:44:25 PM COMPARISON: None available. CLINICAL HISTORY: L leg pain FINDINGS: BONES AND JOINTS: Total left hip arthroplasty in place. No acute fracture. SOFT TISSUES: The soft tissues are unremarkable. IMPRESSION: 1. No acute osseous abnormality. Electronically signed by: Norman Gatlin MD 12/20/2023 08:45 PM EST RP Workstation: [id number]    Pending Labs Unresulted Labs (From admission, onward)    None       Vitals/Pain Today's Vitals   12/21/23 0051 12/21/23 0057 12/21/23 0058  12/21/23 0132  BP:   109/62   Pulse:  92 92   Resp:   19   Temp:      TempSrc:      SpO2:   98%   PainSc: 2    0-No pain    Isolation Precautions No active isolations  Medications Medications  EPINEPHrine  (EPI-PEN) injection 0.3 mg (has no administration in time range)  pantoprazole  (PROTONIX ) EC tablet 40 mg (has no administration in time range)  albuterol  (PROVENTIL ) (2.5 MG/3ML) 0.083% nebulizer solution 2.5 mg (has no administration in time range)  acetaminophen  (TYLENOL ) tablet 650 mg (has no administration in time range)    Or  acetaminophen  (TYLENOL ) suppository 650 mg (has no administration in time range)  ondansetron  (ZOFRAN ) tablet 4 mg (has no administration in time range)    Or  ondansetron  (ZOFRAN ) injection 4 mg (has no administration in time range)  oxyCODONE  (Oxy IR/ROXICODONE ) immediate release tablet 5 mg (has no administration in time range)  fentaNYL  (SUBLIMAZE ) injection 12.5-50 mcg (has no administration in time range)  methocarbamol  (ROBAXIN ) injection 500 mg (has no administration in time range)  diphenhydrAMINE  (BENADRYL ) injection 25 mg (has no administration in time range)  fentaNYL  (SUBLIMAZE ) injection 50 mcg (50 mcg Intravenous Given 12/20/23 2003)  acetaminophen  (TYLENOL ) tablet 1,000 mg (1,000 mg Oral Given 12/20/23 1956)  diphenhydrAMINE  (BENADRYL ) injection 25 mg (25 mg Intravenous Given 12/20/23 2002)  cyclobenzaprine (FLEXERIL) tablet 5 mg (5 mg Oral Given 12/20/23 1956)  fentaNYL  (SUBLIMAZE ) injection 50 mcg (50 mcg Intravenous Given 12/20/23 2249)  oxyCODONE  (Oxy IR/ROXICODONE ) immediate release tablet 5 mg (5 mg Oral Given 12/21/23 0032)    Mobility walks with person assist     Focused Assessments Muscle skeletal   R Recommendations: See Admitting Provider Note  Report given to:   Additional Notes:

## 2023-12-21 NOTE — Plan of Care (Signed)
   Problem: Health Behavior/Discharge Planning: Goal: Ability to manage health-related needs will improve Outcome: Progressing

## 2023-12-21 NOTE — H&P (Signed)
 History and Physical    Patient: Debbie Sosa FMW:[id number] DOB: [birth year] DOA: 12/20/2023 DOS: the patient was seen and examined on 12/21/2023 PCP: Auston Reyes BIRCH, MD  Patient coming from: Home  Chief Complaint:  Chief Complaint  Patient presents with   Hip Pain    Left     HPI: Debbie Sosa is a 48 y.o. female with medical history significant for Rheumatoid arthritis, lDA/B12 deficiency s/p gastric bypass previous Venofer , lymphedema, prior DVT/PE, lumbar radiculopathy, multiple medication allergies, who underwent left hip arthroplasty 09/21/2023, completing 4 weeks of prophylactic Lovenox  early October, being admitted with a large hematoma left thigh.  She presented with sudden onset severe pain of the left lateral thigh while walking with immediate difficulty and bearing weight on the extremity.  It was associated with numbness left calf and foot .  She states that she noticed in the previous week she seemed to be having more difficulty at her physical therapy sessions and also noted that her pants leg was fitting more tightly tighter on the left thigh than the right. In the ED vitals were within normal limits.  Labs including BMP and CBC will for the most part unremarkable except for mild anemia of 11.3, down from 12.3 about 3 weeks prior. CT femur show a large hematoma with edema left thigh in the anterior compartment musculature with recommendation for CTA if concern for active bleeding CT lumbar spine without contrast showed multilevel degenerative changes including disc bulge/canal stenosis and facet arthrosis Lower extremity venous Doppler negative for DVT  Patient was treated with fentanyl , Tylenol  and Flexeril with some improvement in pain from a 10 out of 10 to a 4 out of 10 at the time of evaluation   The ED provider reached out to orthopedist, Dr. Tobie.  Admission requested    Past Medical History:  Diagnosis Date   Anemia    Antiphospholipid antibody positive     Arthritis    Asthma    seasonal   Chronic venous insufficiency    COVID-19 2020   DDD (degenerative disc disease), lumbar    GERD (gastroesophageal reflux disease)    Headache    h/o migraines   History of hiatal hernia    repaired with Gastric bypass   History of kidney stones    Iron  deficiency    recieves iron  infusions every 3 months at the cancer center   Lymphedema    bil legs-sees [facility]   Nausea and vomiting 10/14/2015   Obesity    Pneumonia    Pulmonary embolism (HCC)    after 2nd c-section   Sleep apnea    had gastric bypass and does not need to use cpap since surgery   Vitamin B12 deficiency    Past Surgical History:  Procedure Laterality Date   BREAST BIOPSY Left 2006   benign/clip   BREAST BIOPSY Right 2015   benign/clip   CESAREAN SECTION     CESAREAN SECTION     CHOLECYSTECTOMY     COLONOSCOPY     COLONOSCOPY WITH PROPOFOL  N/A 10/31/2018   Procedure: COLONOSCOPY WITH PROPOFOL ;  Surgeon: Therisa Bi, MD;  Location: [facility] ENDOSCOPY;  Service: Gastroenterology;  Laterality: N/A;   ESOPHAGOGASTRODUODENOSCOPY (EGD) WITH PROPOFOL  N/A 10/04/2016   Procedure: ESOPHAGOGASTRODUODENOSCOPY (EGD) WITH PROPOFOL ;  Surgeon: Therisa Bi, MD;  Location: [facility] ENDOSCOPY;  Service: Gastroenterology;  Laterality: N/A;   ESOPHAGOGASTRODUODENOSCOPY (EGD) WITH PROPOFOL  N/A 04/14/2017   Procedure: ESOPHAGOGASTRODUODENOSCOPY (EGD) WITH PROPOFOL ;  Surgeon: Therisa Bi, MD;  Location:  [facility] ENDOSCOPY;  Service: Gastroenterology;  Laterality: N/A;   ESOPHAGOGASTRODUODENOSCOPY (EGD) WITH PROPOFOL  N/A 10/31/2018   Procedure: ESOPHAGOGASTRODUODENOSCOPY (EGD) WITH PROPOFOL ;  Surgeon: Therisa Bi, MD;  Location: [facility] ENDOSCOPY;  Service: Gastroenterology;  Laterality: N/A;   ESOPHAGOGASTRODUODENOSCOPY (EGD) WITH PROPOFOL  N/A 06/28/2019   Procedure: ESOPHAGOGASTRODUODENOSCOPY (EGD) WITH PROPOFOL ;  Surgeon: Therisa Bi, MD;  Location: [facility] ENDOSCOPY;  Service: Gastroenterology;   Laterality: N/A;   ESOPHAGOGASTRODUODENOSCOPY (EGD) WITH PROPOFOL  N/A 02/10/2021   Procedure: ESOPHAGOGASTRODUODENOSCOPY (EGD) WITH PROPOFOL ;  Surgeon: Therisa Bi, MD;  Location: [facility] ENDOSCOPY;  Service: Gastroenterology;  Laterality: N/A;   GASTRIC BYPASS     01/2015   HERNIA REPAIR     repaired with gastric bypass - Hiatel hernia   HYSTEROSCOPY WITH D & C N/A 09/17/2021   Procedure: DILATATION AND CURETTAGE /HYSTEROSCOPY;  Surgeon: Leonce Garnette BIRCH, MD;  Location: [facility] ORS;  Service: Gynecology;  Laterality: N/A;   INTRAUTERINE DEVICE (IUD) INSERTION N/A 09/17/2021   Procedure: INTRAUTERINE DEVICE (IUD) INSERTION - MIRENA ;  Surgeon: Leonce Garnette BIRCH, MD;  Location: [facility] ORS;  Service: Gynecology;  Laterality: N/A;   IUD REMOVAL N/A 09/17/2021   Procedure: INTRAUTERINE DEVICE (IUD) REMOVAL;  Surgeon: Leonce Garnette BIRCH, MD;  Location: [facility] ORS;  Service: Gynecology;  Laterality: N/A;   IVC FILTER INSERTION N/A 09/19/2023   Procedure: IVC FILTER INSERTION;  Surgeon: Jama Cordella MATSU, MD;  Location: [facility] INVASIVE CV LAB;  Service: Cardiovascular;  Laterality: N/A;   IVC FILTER REMOVAL N/A 11/07/2023   Procedure: IVC FILTER REMOVAL;  Surgeon: Jama Cordella MATSU, MD;  Location: [facility] INVASIVE CV LAB;  Service: Cardiovascular;  Laterality: N/A;   KNEE SURGERY Right    NASAL SINUS SURGERY     PLANTAR FASCIA SURGERY     x 3   REDUCTION MAMMAPLASTY Bilateral 2005   TOTAL HIP ARTHROPLASTY Left 09/21/2023   Procedure: ARTHROPLASTY, HIP, TOTAL, ANTERIOR APPROACH;  Surgeon: Lorelle Hussar, MD;  Location: [facility] ORS;  Service: Orthopedics;  Laterality: Left;   Social History:  reports that she has never smoked. She has never used smokeless tobacco. She reports that she does not drink alcohol and does not use drugs.  Allergies  Allergen Reactions   Amoxicillin  Anaphylaxis    Throat swelling  Sob, itching  amoxicillin    Avocado Anaphylaxis   Cephalexin  Hives and Rash   Fexofenadine Shortness  Of Breath   Iodinated Contrast Media Hives   Ketorolac  Hives   Morphine Anaphylaxis and Dermatitis   Penicillins Swelling    Throat swelling  Product containing penicillin  (product)   Phenazopyridine Rash, Dermatitis and Other (See Comments)    phenazopyridine hydrochloride   Promethazine Hives   Diflucan  [Fluconazole ] Rash   Cetirizine Other (See Comments)    Skin eruptures Patient denies   Diclofenac  Sodium Hives   Diclofenac  Sodium Hives   Doxycycline Other (See Comments)   Egg Protein-Containing Drug Products Other (See Comments)    Other reaction(s): RASH  egg extract   Hepatitis B Vaccine Dermatitis   Latex     With condoms   Levofloxacin  Other (See Comments)    levofloxacin    Nsaids Other (See Comments)   Other Hives and Dermatitis    Pain medicine that starts with an L   Tramadol  Hives    Can take with benadryl    Hepatitis B Virus Vaccines Rash   Misc. Sulfonamide Containing Compounds Rash   Morphine And Codeine Rash   Naproxen Rash and Dermatitis   Oxycodone -Acetaminophen  Itching   Pyridium [Phenazopyridine Hcl] Rash  Sulfa Antibiotics Rash and Other (See Comments)   Sulfasalazine Rash and Dermatitis   Vioxx [Rofecoxib] Rash    Family History  Problem Relation Age of Onset   Breast cancer Mother 78   Breast cancer Cousin 30   Breast cancer Cousin 66   Asthma Father    Asthma Sister    Asthma Child     Prior to Admission medications   Medication Sig Start Date End Date Taking? Authorizing Provider  albuterol  (VENTOLIN  HFA) 108 (90 Base) MCG/ACT inhaler Inhale 1-2 puffs INH Q4-6hr prn for chest tightness, cough, wheezing, SOB/DOE. 01/10/19   Christopher Savannah, PA-C  cyanocobalamin  (,VITAMIN B-12,) 1000 MCG/ML injection Inject 1,000 mcg into the muscle once a week. 01/13/20   [provider]  diphenhydrAMINE  (BENADRYL ) 25 mg capsule Take 1 capsule (25 mg total) by mouth every 6 (six) hours as needed for itching or allergies. 08/23/23   Amin, Sumayya,  MD  EPINEPHrine  (EPIPEN  2-PAK) 0.3 mg/0.3 mL IJ SOAJ injection Inject 0.3 mLs (0.3 mg total) into the muscle as needed for anaphylaxis. 04/16/19   Meade Verdon RAMAN, MD  esomeprazole  (NEXIUM ) 40 MG capsule Take 1 capsule (40 mg total) by mouth daily at 12 noon. 07/18/22 12/04/23  [place], PA-C  fluconazole  (DIFLUCAN ) 150 MG tablet Oral 01/27/19   [provider]  hyoscyamine  (LEVSIN ) 0.125 MG tablet Oral 01/27/19   [provider]  levocetirizine (XYZAL ) 5 MG tablet Take 5 mg by mouth every morning.    [provider]  levonorgestrel  (MIRENA ) 20 MCG/DAY IUD by Intrauterine route.    [provider]  montelukast  (SINGULAIR ) 10 MG tablet Oral 01/27/19   [provider]  Na Sulfate-K Sulfate-Mg Sulfate concentrate (SUPREP BOWEL PREP KIT) 17.5-3.13-1.6 GM/177ML SOLN Oral 01/27/19   [provider]  nitrofurantoin, macrocrystal-monohydrate, (MACROBID) 100 MG capsule Take 100 mg by mouth 2 (two) times daily. 10/26/23   [provider]  Olopatadine HCl 0.2 % SOLN Ophthalmic 01/27/19   [provider]  pramipexole (MIRAPEX) 0.125 MG tablet Oral 01/27/19   [provider]  predniSONE  (DELTASONE ) 50 MG tablet Oral 01/27/19   [provider]  sulfamethoxazole-trimethoprim (BACTRIM DS) 800-160 MG tablet Take 1 tablet by mouth 2 (two) times daily. 10/30/23   [provider]  tobramycin -dexamethasone  (TOBRADEX) ophthalmic ointment Ophthalmic 01/27/19   [provider]  valACYclovir (VALTREX) 1000 MG tablet Oral 01/27/19   [provider]  Vitamin D , Ergocalciferol , (DRISDOL ) 1.25 MG (50000 UT) CAPS capsule Take 50,000 Units by mouth once a week. Mondays 11/28/18   [provider]  pantoprazole  (PROTONIX ) 40 MG tablet Take 1 tablet (40 mg total) by mouth daily. 04/16/19 12/16/19  Meade Verdon RAMAN, MD    Physical Exam: Vitals:   12/20/23 1729 12/20/23 2252  BP: 116/81 104/78  Pulse: 79 79   Resp: 20 18  Temp: 97.8 F (36.6 C) 98 F (36.7 C)  TempSrc: Oral   SpO2: 100% 98%   Physical Exam Vitals and nursing note reviewed.  Constitutional:      General: She is not in acute distress. HENT:     Head: Normocephalic and atraumatic.  Cardiovascular:     Rate and Rhythm: Normal rate and regular rhythm.     Heart sounds: Normal heart sounds.  Pulmonary:     Effort: Pulmonary effort is normal.     Breath sounds: Normal breath sounds.  Abdominal:     Palpations: Abdomen is soft.     Tenderness: There is no abdominal  tenderness.  Musculoskeletal:     Comments: Tenderness lateral left thigh.  Slight firmness to palpation left lateral thigh compared to right.  Neurological:     Mental Status: Mental status is at baseline.     Labs on Admission: I have personally reviewed following labs and imaging studies  CBC: Recent Labs  Lab 12/20/23 2008  WBC 9.0  NEUTROABS 6.0  HGB 11.7*  HCT 34.9*  MCV 89.7  PLT 284   Basic Metabolic Panel: Recent Labs  Lab 12/20/23 2008  NA 139  K 3.6  CL 104  CO2 25  GLUCOSE 105*  BUN 16  CREATININE 0.77  CALCIUM 9.0   GFR: CrCl cannot be calculated (Unknown ideal weight.). Liver Function Tests: No results for input(s): AST, ALT, ALKPHOS, BILITOT, PROT, ALBUMIN in the last 168 hours. No results for input(s): LIPASE, AMYLASE in the last 168 hours. No results for input(s): AMMONIA in the last 168 hours. Coagulation Profile: Recent Labs  Lab 12/20/23 2008  INR 1.0   Cardiac Enzymes: Recent Labs  Lab 12/20/23 2008  CKTOTAL 147   BNP (last 3 results) No results for input(s): PROBNP in the last 8760 hours. HbA1C: No results for input(s): HGBA1C in the last 72 hours. CBG: No results for input(s): GLUCAP in the last 168 hours. Lipid Profile: No results for input(s): CHOL, HDL, LDLCALC, TRIG, CHOLHDL, LDLDIRECT in the last 72 hours. Thyroid Function Tests: No results for input(s):  TSH, T4TOTAL, FREET4, T3FREE, THYROIDAB in the last 72 hours. Anemia Panel: No results for input(s): VITAMINB12, FOLATE, FERRITIN, TIBC, IRON , RETICCTPCT in the last 72 hours. Urine analysis:    Component Value Date/Time   COLORURINE YELLOW (A) 09/08/2023 0940   APPEARANCEUR CLEAR (A) 09/08/2023 0940   APPEARANCEUR Turbid 08/23/2013 1720   LABSPEC 1.018 09/08/2023 0940   LABSPEC 1.008 08/23/2013 1720   PHURINE 5.0 09/08/2023 0940   GLUCOSEU NEGATIVE 09/08/2023 0940   GLUCOSEU Negative 08/23/2013 1720   HGBUR MODERATE (A) 09/08/2023 0940   BILIRUBINUR NEGATIVE 09/08/2023 0940   BILIRUBINUR Negative 08/23/2013 1720   KETONESUR NEGATIVE 09/08/2023 0940   PROTEINUR NEGATIVE 09/08/2023 0940   NITRITE NEGATIVE 09/08/2023 0940   LEUKOCYTESUR NEGATIVE 09/08/2023 0940   LEUKOCYTESUR 3+ 08/23/2013 1720    Radiological Exams on Admission: CT Lumbar Spine Wo Contrast Result Date: 12/21/2023 EXAM: CT OF THE LUMBAR SPINE WITHOUT CONTRAST 12/20/2023 11:53:51 PM TECHNIQUE: CT of the lumbar spine was performed without the administration of intravenous contrast. Multiplanar reformatted images are provided for review. Automated exposure control, iterative reconstruction, and/or weight based adjustment of the mA/kV was utilized to reduce the radiation dose to as low as reasonably achievable. COMPARISON: None available. CLINICAL HISTORY: Severe left leg pain, possible radiculopathy. FINDINGS: BONES AND ALIGNMENT: Normal vertebral body heights. No acute fracture or suspicious bone lesion. Normal alignment. DEGENERATIVE CHANGES: Intervertebral disc height is preserved. Moderate facet arthrosis, left greater than right at L4-L5. Moderate left and severe right facet arthrosis at L5-S1. Broad-based disc bulge at L3-L4 and L4-L5 with associated laminar hypertrophy resulting in mild central canal stenosis within the subforaminal zone at L3-L4 and moderate central canal stenosis within the  subforaminal zone at L4-L5. The spinal canal is otherwise widely patent. No significant neural foraminal narrowing. SOFT TISSUES: Surgical changes of gastric bypass were identified. A 3 mm nonobstructing calculus centered within the interpolar region of the right kidney. IMPRESSION: 1. Broad-based disc bulge at L3-L4 with associated laminar hypertrophy resulting in mild central canal stenosis within the subforaminal zone. 2.  Broad-based disc bulge at L4-L5 with associated laminar hypertrophy resulting in moderate central canal stenosis within the subforaminal zone. 3. Severe right facet arthrosis at L5-S1, with moderate left facet arthrosis at L5-S1 and moderate facet arthrosis at L4-L5. Electronically signed by: Dorethia Molt MD 12/21/2023 12:05 AM EST RP Workstation: [id number]   CT FEMUR LEFT WO CONTRAST Result Date: 12/21/2023 EXAM: CT OF THE LEFT FEMUR, WITHOUT IV CONTRAST 12/20/2023 11:53:51 PM TECHNIQUE: Axial images were acquired through the left femur without IV contrast. Reformatted images were reviewed. Automated exposure control, iterative reconstruction, and/or weight based adjustment of the mA/kV was utilized to reduce the radiation dose to as low as reasonably achievable. COMPARISON: Comparison with same day x-ray and CT 08/22/2023. CLINICAL HISTORY: Severe left leg pain of unclear etiology, consider muscle hematoma, unlikely occult fracture. FINDINGS: BONES: No acute fracture or focal osseous lesion. Left hip arthroplasty without evidence of loosening. JOINTS: No dislocation. The joint spaces are normal. No knee joint effusion. SOFT TISSUES: Extensive heterogeneous edema and hyperdense fluid within the anterior compartment musculature of the left thigh. The margins are not well defined but the hematoma measures approximately 6.3 x 4.6 x 24.7 cm. Assessment for active bleeding is impossible on noncontrast exam. If there is concern for active bleeding. Consider CTA of the left lower extremity.  IMPRESSION: 1. Large hematoma and edema in the left thigh anterior compartment musculature. Clinical assessment for compartment syndrome is recommended. 2. If there is concern for active bleeding, consider CTA of the left lower extremity. Electronically signed by: Norman Gatlin MD 12/21/2023 12:05 AM EST RP Workstation: [id number]   US  Venous Img Lower Unilateral Left Result Date: 12/20/2023 CLINICAL DATA:  Left leg pain. EXAM: LEFT LOWER EXTREMITY VENOUS DOPPLER ULTRASOUND TECHNIQUE: Gray-scale sonography with graded compression, as well as color Doppler and duplex ultrasound were performed to evaluate the lower extremity deep venous systems from the level of the common femoral vein and including the common femoral, femoral, profunda femoral, popliteal and calf veins including the posterior tibial, peroneal and gastrocnemius veins when visible. The superficial great saphenous vein was also interrogated. Spectral Doppler was utilized to evaluate flow at rest and with distal augmentation maneuvers in the common femoral, femoral and popliteal veins. COMPARISON:  July 08, 2023 FINDINGS: Contralateral Common Femoral Vein: Respiratory phasicity is normal and symmetric with the symptomatic side. No evidence of thrombus. Normal compressibility. Common Femoral Vein: No evidence of thrombus. Normal compressibility, respiratory phasicity and response to augmentation. Saphenofemoral Junction: No evidence of thrombus. Normal compressibility and flow on color Doppler imaging. Profunda Femoral Vein: No evidence of thrombus. Normal compressibility and flow on color Doppler imaging. Femoral Vein: No evidence of thrombus. Normal compressibility, respiratory phasicity and response to augmentation. Popliteal Vein: No evidence of thrombus. Normal compressibility, respiratory phasicity and response to augmentation. Calf Veins: No evidence of thrombus. Normal compressibility and flow on color Doppler imaging. Superficial Great  Saphenous Vein: No evidence of thrombus. Normal compressibility. Venous Reflux:  None. Other Findings:  None. IMPRESSION: No evidence of deep venous thrombosis within the LEFT lower extremity. Electronically Signed   By: Suzen Dials M.D.   On: 12/20/2023 23:41   DG Hip Unilat W or Wo Pelvis 2-3 Views Left Result Date: 12/20/2023 EXAM: 2 or more VIEW(S) XRAY OF THE PELVIS AND LEFT HIP 12/20/2023 07:44:25 PM COMPARISON: None available. CLINICAL HISTORY: L hip pain, left hip replacement about 3 months ago FINDINGS: BONES AND JOINTS: SI joints are symmetric. No acute fracture. Bilateral hips demonstrate normal alignment. Left  hip arthroplasty noted. Intact and well seated hardware. Surgical clips overlie the right hemipelvis. SOFT TISSUES: IUD. IMPRESSION: 1. Left hip arthroplasty with intact and well-seated hardware. Electronically signed by: Norman Gatlin MD 12/20/2023 08:46 PM EST RP Workstation: [id number]   DG Femur Min 2 Views Left Result Date: 12/20/2023 EXAM: 2 VIEW(S) XRAY OF THE LEFT FEMUR 12/20/2023 07:44:25 PM COMPARISON: None available. CLINICAL HISTORY: L leg pain FINDINGS: BONES AND JOINTS: Total left hip arthroplasty in place. No acute fracture. SOFT TISSUES: The soft tissues are unremarkable. IMPRESSION: 1. No acute osseous abnormality. Electronically signed by: Norman Gatlin MD 12/20/2023 08:45 PM EST RP Workstation: [id number]   Data Reviewed for HPI: Relevant notes from primary care and specialist visits, past discharge summaries as available in EHR, including Care Everywhere. Prior diagnostic testing as pertinent to current admission diagnoses Updated medications and problem lists for reconciliation ED course, including vitals, labs, imaging, treatment and response to treatment Triage notes, nursing and pharmacy notes and ED provider's notes Notable results as noted above in HPI      Assessment and Plan: * Intramuscular hematoma, left thigh History of left hip  arthroplasty 09/21/2023 Intractable pain with ambulatory dysfunction CT showing large hematoma with edema Personally discussed with Dr. Earnestine Blanch due to concerns of possible compartment syndrome-will come into see patient Pain control--monitor for allergies-history of multiple Serial H&H Addendum: Discussed with Dr. Earnestine Blanch in person who came in to evaluate patient-recommending CTA as suggested by radiologist.  Will premedicate patient-history of hives with prior contrast administration  History of venous thromboembolism (DVT/PE) S/p perioperative IVC placement (09/19/2023 to 11/07/2023)  S/p 4-week prophylactic post op Lovenox  (9-10/2023) Venous ultrasound negative for DVT   Multiple drug allergies On EpiPen  Monitor for allergies.  Benadryl  as needed  Lumbar radiculitis Chronic degenerative changes seen on CT lumbar spine  Rheumatoid arthritis involving multiple sites with positive rheumatoid factor (HCC) No acute issues suspected Follows with rheumatology  Lymphedema, chronic No acute issues suspected Follows with vascular Uses compressive stockings  History of gastric bypass History of IDA/B12 deficiency Previously on Venofer  Hemoglobin 11.3, down from 12.3 about 3 weeks prior Continue to monitor in the setting of large hematoma    DVT prophylaxis: SCD  Consults: ortho, Dr Earnestine Blanch  Advance Care Planning:   Code Status: Prior   Family Communication: none  Disposition Plan: Back to previous home environment  Severity of Illness: The appropriate patient status for this patient is OBSERVATION. Observation status is judged to be reasonable and necessary in order to provide the required intensity of service to ensure the patient's safety. The patient's presenting symptoms, physical exam findings, and initial radiographic and laboratory data in the context of their medical condition is felt to place them at decreased risk for further clinical deterioration.  Furthermore, it is anticipated that the patient will be medically stable for discharge from the hospital within 2 midnights of admission.   Author: Delayne LULLA Solian, MD 12/21/2023 12:50 AM  For on call review [email or web address].

## 2023-12-22 ENCOUNTER — Encounter: Payer: Self-pay | Admitting: Orthopedic Surgery

## 2023-12-22 DIAGNOSIS — M0579 Rheumatoid arthritis with rheumatoid factor of multiple sites without organ or systems involvement: Secondary | ICD-10-CM | POA: Diagnosis not present

## 2023-12-22 DIAGNOSIS — M60852 Other myositis, left thigh: Secondary | ICD-10-CM

## 2023-12-22 DIAGNOSIS — M79605 Pain in left leg: Principal | ICD-10-CM

## 2023-12-22 MED ORDER — PREDNISONE 10 MG PO TABS: 10.0000 mg | ORAL_TABLET | Freq: Every day | ORAL | Status: DC

## 2023-12-22 MED ORDER — DIPHENHYDRAMINE HCL 25 MG PO CAPS: 25.0000 mg | ORAL_CAPSULE | Freq: Four times a day (QID) | ORAL | Status: DC | PRN

## 2023-12-22 MED ORDER — AZITHROMYCIN 250 MG PO TABS
500.0000 mg | ORAL_TABLET | Freq: Every day | ORAL | Status: AC
  Administered 2023-12-22 – 2023-12-23 (×2): 500 mg via ORAL
  Filled 2023-12-22 (×2): qty 2

## 2023-12-22 MED ORDER — PREDNISONE 20 MG PO TABS: 30.0000 mg | ORAL_TABLET | Freq: Every day | ORAL | Status: DC

## 2023-12-22 MED ORDER — PREDNISONE 20 MG PO TABS: 20.0000 mg | ORAL_TABLET | Freq: Every day | ORAL | Status: DC

## 2023-12-22 MED ORDER — VITAMIN D 25 MCG (1000 UNIT) PO TABS
1000.0000 [IU] | ORAL_TABLET | Freq: Every day | ORAL | Status: DC
  Administered 2023-12-22 – 2023-12-23 (×2): 1000 [IU] via ORAL
  Filled 2023-12-22 (×2): qty 1

## 2023-12-22 MED ORDER — PREDNISONE 20 MG PO TABS
60.0000 mg | ORAL_TABLET | Freq: Every day | ORAL | Status: AC
  Administered 2023-12-23: 60 mg via ORAL
  Filled 2023-12-22: qty 3

## 2023-12-22 MED ORDER — BISACODYL 5 MG PO TBEC
10.0000 mg | DELAYED_RELEASE_TABLET | Freq: Once | ORAL | Status: AC
  Administered 2023-12-22: 10 mg via ORAL
  Filled 2023-12-22: qty 2

## 2023-12-22 MED ORDER — METHOCARBAMOL 500 MG PO TABS
500.0000 mg | ORAL_TABLET | Freq: Four times a day (QID) | ORAL | Status: DC | PRN
  Administered 2023-12-22 (×2): 500 mg via ORAL
  Filled 2023-12-22 (×2): qty 1

## 2023-12-22 MED ORDER — PANTOPRAZOLE SODIUM 40 MG PO TBEC
40.0000 mg | DELAYED_RELEASE_TABLET | Freq: Two times a day (BID) | ORAL | Status: DC
  Administered 2023-12-22 – 2023-12-23 (×2): 40 mg via ORAL
  Filled 2023-12-22 (×2): qty 1

## 2023-12-22 MED ORDER — VITAMIN B-12 1000 MCG PO TABS
1000.0000 ug | ORAL_TABLET | Freq: Every day | ORAL | Status: DC
  Administered 2023-12-22 – 2023-12-23 (×2): 1000 ug via ORAL
  Filled 2023-12-22 (×2): qty 1

## 2023-12-22 MED ORDER — ALUM & MAG HYDROXIDE-SIMETH 200-200-20 MG/5ML PO SUSP
30.0000 mL | ORAL | Status: DC | PRN
  Administered 2023-12-22: 30 mL via ORAL
  Filled 2023-12-22: qty 30

## 2023-12-22 MED ORDER — POLYETHYLENE GLYCOL 3350 17 G PO PACK
17.0000 g | PACK | Freq: Two times a day (BID) | ORAL | Status: DC
  Administered 2023-12-22 – 2023-12-23 (×3): 17 g via ORAL
  Filled 2023-12-22 (×3): qty 1

## 2023-12-22 MED ORDER — PREDNISONE 20 MG PO TABS: 40.0000 mg | ORAL_TABLET | Freq: Every day | ORAL | Status: DC

## 2023-12-22 MED ORDER — PREDNISONE 20 MG PO TABS: 50.0000 mg | ORAL_TABLET | Freq: Every day | ORAL | Status: DC

## 2023-12-22 NOTE — Plan of Care (Signed)
   Problem: Education: Goal: Knowledge of General Education information will improve Description Including pain rating scale, medication(s)/side effects and non-pharmacologic comfort measures Outcome: Progressing

## 2023-12-22 NOTE — Evaluation (Signed)
 Physical Therapy Evaluation Patient Details Name: Debbie Sosa MRN: [id number] DOB: 1975-06-29 Today's Date: 12/22/2023  History of Present Illness  Debbie Sosa is a 48 y.o. female with medical history significant for Rheumatoid arthritis, anemia with iron  deficiency and vitamin B12 deficiency, s/p gastric bypass, lymphedema, prior DVT/PE, lumbar radiculopathy, multiple medication allergies, who underwent left hip arthroplasty 09/21/2023. Presented to hospital with sudden onset of left thigh pain while walking with difficulty weightbearing with numbness of the left calf and foot.  MD assessment includes intramuscular hematoma of the left thigh.   Clinical Impression  Pt was pleasant and motivated to participate during the session and put forth good effort throughout. Pt required min cuing for sequencing with transfers but demonstrated good control and stability.  Pt provided with step-to sequencing education during gait training to allow pt to compensate for LLE pain with weight bearing as needed; pt ambulated 125 feet with around half of that step-to pattern and the second half pt self-selected back to step-through pattern both with no overt LOB.  Pt generally steady during stair training, again with step-to pattern, with good eccentric and concentric control and stability.  Pt will benefit from continued PT services upon discharge to safely address deficits listed in patient problem list for decreased caregiver assistance and eventual return to PLOF.          If plan is discharge home, recommend the following: A little help with walking and/or transfers;A little help with bathing/dressing/bathroom;Assistance with cooking/housework;Help with stairs or ramp for entrance;Assist for transportation   Can travel by private vehicle        Equipment Recommendations None recommended by PT  Recommendations for Other Services       Functional Status Assessment Patient has had a recent decline  in their functional status and demonstrates the ability to make significant improvements in function in a reasonable and predictable amount of time.     Precautions / Restrictions Precautions Precautions: Fall Recall of Precautions/Restrictions: Intact Restrictions Weight Bearing Restrictions Per Provider Order: Yes LLE Weight Bearing Per Provider Order: Weight bearing as tolerated      Mobility  Bed Mobility               General bed mobility comments: NT, pt in recliner pre/post session    Transfers Overall transfer level: Needs assistance Equipment used: Rolling walker (2 wheels) Transfers: Sit to/from Stand Sit to Stand: Supervision           General transfer comment: Min verbal cues for sequencing; steady with good eccentric and concentric control    Ambulation/Gait Ambulation/Gait assistance: Supervision Gait Distance (Feet): 125 Feet Assistive device: Rolling walker (2 wheels) Gait Pattern/deviations: Step-to pattern, Step-through pattern, Antalgic, Decreased stance time - left, Decreased step length - right Gait velocity: decreased     General Gait Details: Step-to sequencing education provided with practice to allow pt to compensate for LLE pain with weight bearing as needed; pt ambulated 125 feet with around half of that step-to pattern and the second half pt self-selected back to step-through pattern  Stairs Stairs: Yes Stairs assistance: Supervision Stair Management: Two rails, Step to pattern, Forwards Number of Stairs: 4 General stair comments: Step-to sequencing to address antalgic LLE with min verbal and visual cues for proper sequencing, good carryover and good control and stability throughout  Wheelchair Mobility     Tilt Bed    Modified Rankin (Stroke Patients Only)       Balance Overall balance assessment: No apparent balance  deficits (not formally assessed)                                           Pertinent  Vitals/Pain Pain Assessment Pain Assessment: 0-10 Pain Score: 8  Pain Location: L thigh Pain Descriptors / Indicators: Sore Pain Intervention(s): Monitored during session, Premedicated before session, Repositioned    Home Living Family/patient expects to be discharged to:: Private residence Living Arrangements: Children Available Help at Discharge: Available PRN/intermittently Type of Home: House Home Access: Stairs to enter Entrance Stairs-Rails: Right;Left;Can reach both Entrance Stairs-Number of Steps: 6   Home Layout: One level Home Equipment: Agricultural Consultant (2 wheels);BSC/3in1      Prior Function Prior Level of Function : Independent/Modified Independent;Driving;Working/employed             Mobility Comments: Ind amb community distances without an AD, no fall history, works as an tourist information centre manager, still receiving OPPT services from [facility] this past September ADLs Comments: Ind with ADLs     Extremity/Trunk Assessment   Upper Extremity Assessment Upper Extremity Assessment: Overall WFL for tasks assessed    Lower Extremity Assessment Lower Extremity Assessment: LLE deficits/detail LLE Deficits / Details: edema, ace wrap to thigh LLE: Unable to fully assess due to pain       Communication   Communication Communication: No apparent difficulties    Cognition Arousal: Alert Behavior During Therapy: WFL for tasks assessed/performed   PT - Cognitive impairments: No apparent impairments                         Following commands: Intact       Cueing Cueing Techniques: Verbal cues, Visual cues     General Comments      Exercises     Assessment/Plan    PT Assessment Patient needs continued PT services  PT Problem List Decreased strength;Decreased activity tolerance;Decreased mobility;Decreased knowledge of use of DME;Pain       PT Treatment Interventions DME instruction;Gait training;Stair training;Functional mobility  training;Therapeutic activities;Therapeutic exercise;Balance training;Patient/family education    PT Goals (Current goals can be found in the Care Plan section)  Acute Rehab PT Goals 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 PT Goal Formulation: With patient Time For Goal Achievement: 01/04/24 Potential to Achieve Goals: Good    Frequency Min 2X/week     Co-evaluation               AM-PAC PT 6 Clicks Mobility  Outcome Measure Help needed turning from your back to your side while in a flat bed without using bedrails?: None Help needed moving from lying on your back to sitting on the side of a flat bed without using bedrails?: None Help needed moving to and from a bed to a chair (including a wheelchair)?: None Help needed standing up from a chair using your arms (e.g., wheelchair or bedside chair)?: A Little Help needed to walk in hospital room?: A Little Help needed climbing 3-5 steps with a railing? : A Little 6 Click Score: 21    End of Session Equipment Utilized During Treatment: Gait belt Activity Tolerance: Patient tolerated treatment well Patient left: in chair;with call bell/phone within reach;with chair alarm set Nurse Communication: Mobility status PT Visit Diagnosis: Difficulty in walking, not elsewhere classified (R26.2);Muscle weakness (generalized) (M62.81);Pain Pain - Right/Left: Left Pain - part of body: Leg  Time: 1132-1204 PT Time Calculation (min) (ACUTE ONLY): 32 min   Charges:   PT Evaluation $PT Eval Moderate Complexity: 1 Mod PT Treatments $Gait Training: 8-22 mins PT General Charges $$ ACUTE PT VISIT: 1 Visit       D. Glendia Bertin PT, DPT 12/22/23, 4:04 PM

## 2023-12-22 NOTE — Progress Notes (Signed)
   Subjective: 1 Day Post-Op Procedure(s) (LRB): LEFT HIP EXAMINATION UNDER ANESTHESIA WITH ATTEMPTED FLUOROSCOPIC GUIDED ASPIRATION, LEFT THIGH ULTRASOUND (Left) Patient with slight improvement in left thigh pain overnight. She has not attempted ambulation. Overall doing well.  Objective: Vital signs in last 24 hours: Temp:  [96.8 F (36 C)-98.4 F (36.9 C)] 98.4 F (36.9 C) (12/05 0725) Pulse Rate:  [67-105] 78 (12/05 0725) Resp:  [11-20] 16 (12/05 0725) BP: (85-151)/(49-97) 139/60 (12/05 0725) SpO2:  [95 %-100 %] 95 % (12/05 0725) Weight:  [91.6 kg] 91.6 kg (12/04 1412)  Intake/Output from previous day: 12/04 0701 - 12/05 0700 In: 700 [I.V.:700] Out: 800 [Urine:800] Intake/Output this shift: No intake/output data recorded.  Recent Labs    12/20/23 2008  HGB 11.7*   Recent Labs    12/20/23 2008  WBC 9.0  RBC 3.89  HCT 34.9*  PLT 284   Recent Labs    12/20/23 2008  NA 139  K 3.6  CL 104  CO2 25  BUN 16  CREATININE 0.77  GLUCOSE 105*  CALCIUM 9.0   Recent Labs    12/20/23 2008  INR 1.0    EXAM General - Patient is Alert, Appropriate, and Oriented Extremity - Neurovascular intact Sensation intact distally Intact pulses distally Dorsiflexion/Plantar flexion intact No cellulitis present Compartment soft Ace wrap intact. , minimal tenderness to the thigh Motor Function - intact, moving foot and toes well on exam.   Past Medical History:  Diagnosis Date   Anemia    Antiphospholipid antibody positive    Arthritis    Asthma    seasonal   Chronic venous insufficiency    COVID-19 2020   DDD (degenerative disc disease), lumbar    GERD (gastroesophageal reflux disease)    Headache    h/o migraines   History of hiatal hernia    repaired with Gastric bypass   History of kidney stones    Iron  deficiency    rec[facility] iron  inf[facility]   Lymphedema    bil legs-sees [facility]   Nausea and vomiting 10/14/2015    Obesity    Pneumonia    Pulmonary embolism (HCC)    after 2nd c-section   Sleep apnea    had gastric bypass and does not need to use cpap since surgery   Vitamin B12 deficiency     Assessment/Plan:   1 Day Post-Op Procedure(s) (LRB): LEFT HIP EXAMINATION UNDER ANESTHESIA WITH ATTEMPTED FLUOROSCOPIC GUIDED ASPIRATION, LEFT THIGH ULTRASOUND (Left) Principal Problem:   Intramuscular hematoma, left thigh Active Problems:   History of gastric bypass   Rheumatoid arthritis involving multiple sites with positive rheumatoid factor (HCC)   Lymphedema, chronic   Lumbar radiculitis   History of venous thromboembolism (DVT/PE)   Multiple drug allergies  Estimated body mass index is 38.17 kg/m as calculated from the following:   Height as of this encounter: 5' 1 (1.549 m).   Weight as of this encounter: 91.6 kg. Up with therapy, WBAT LLE No signs of infection or compartment syndrome. No drainable fluid collection. Wll treat as myositis. Recommend medrol  Dosepak at discharge. Patient can use ace wrap as needed for comfort. Follow up with [facility] in 1 week for recheck    T. Medford Amber, PA-C [facility] 12/22/2023, 10:04 AM

## 2023-12-22 NOTE — Evaluation (Signed)
 Occupational Therapy Evaluation Patient Details Name: Debbie Sosa MRN: [id number] DOB: [birth year] Today's Date: 12/22/2023   History of Present Illness   Debbie Sosa is a 48 y.o. female with medical history significant for Rheumatoid arthritis, anemia with iron  deficiency and vitamin B12 deficiency, s/p gastric bypass, lymphedema, prior DVT/PE, lumbar radiculopathy, multiple medication allergies, who underwent left hip arthroplasty 09/21/2023. Presented to hospital with left thigh sudden onset of pain while walking with difficulty weightbearing with numbness of the left calf and foot .   Clinical Impressions Debbie Sosa was seen for OT evaluation this date. Prior to hospital admission, pt was IND. Pt lives with son and 11 mo granddaughter. Pt currently requires MAX A don L sock only. MOD I for toielting and standing grooming tasks, tolerates ~100 ft using RW. Educated on DME and d/c recs. All education complete, will sign off. Upon hospital discharge, recommend no OT follow up.    If plan is discharge home, recommend the following:   Help with stairs or ramp for entrance     Functional Status Assessment   Patient has had a recent decline in their functional status and demonstrates the ability to make significant improvements in function in a reasonable and predictable amount of time.     Equipment Recommendations   None recommended by OT     Recommendations for Other Services         Precautions/Restrictions   Precautions Precautions: None Recall of Precautions/Restrictions: Intact Restrictions Weight Bearing Restrictions Per Provider Order: Yes LLE Weight Bearing Per Provider Order: Weight bearing as tolerated     Mobility Bed Mobility Overal bed mobility: Modified Independent                  Transfers Overall transfer level: Modified independent Equipment used: Rolling walker (2 wheels)                      Balance Overall balance  assessment: No apparent balance deficits (not formally assessed)                                         ADL either performed or assessed with clinical judgement   ADL Overall ADL's : Needs assistance/impaired                                       General ADL Comments: MAX A don L sock only. MOD I for toielting and standing grooming tasks, tolerates ~100 ft using RW      Pertinent Vitals/Pain Pain Assessment Pain Assessment: 0-10 Pain Score: 7  Pain Location: L thigh Pain Descriptors / Indicators: Grimacing, Discomfort Pain Intervention(s): Limited activity within patient's tolerance, Premedicated before session     Extremity/Trunk Assessment Upper Extremity Assessment Upper Extremity Assessment: Overall WFL for tasks assessed   Lower Extremity Assessment Lower Extremity Assessment: LLE deficits/detail LLE Deficits / Details: edema, ace wrap to thigh       Communication Communication Communication: No apparent difficulties   Cognition Arousal: Alert Behavior During Therapy: WFL for tasks assessed/performed Cognition: No apparent impairments  Home Living Family/patient expects to be discharged to:: Private residence Living Arrangements: Children Available Help at Discharge: Available PRN/intermittently Type of Home: House Home Access: Stairs to enter [employer] of Steps: 6 with B rails, 2 no rails Entrance Stairs-Rails: Can reach both Home Layout: One level     Bathroom Shower/Tub: Walk-in shower         Home Equipment: Agricultural Consultant (2 wheels);BSC/3in1          Prior Functioning/Environment Prior Level of Function : Independent/Modified Independent;Driving;Working/employed             Mobility Comments: no AD use and returned to work as Brewing Technologist Problem List: Decreased range of motion;Decreased activity  tolerance        OT Goals(Current goals can be found in the care plan section)   Acute Rehab OT Goals Patient Stated Goal: to go home OT Goal Formulation: With patient Time For Goal Achievement: 12/22/23 Potential to Achieve Goals: Good   AM-PAC OT 6 Clicks Daily Activity     Outcome Measure Help from another person eating meals?: None Help from another person taking care of personal grooming?: None Help from another person toileting, which includes using toliet, bedpan, or urinal?: None Help from another person bathing (including washing, rinsing, drying)?: A Little Help from another person to put on and taking off regular upper body clothing?: None Help from another person to put on and taking off regular lower body clothing?: A Little 6 Click Score: 22   End of Session Equipment Utilized During Treatment: Rolling walker (2 wheels) Nurse Communication: Mobility status  Activity Tolerance: Patient tolerated treatment well Patient left: in chair;with call bell/phone within reach  OT Visit Diagnosis: Other abnormalities of gait and mobility (R26.89)                Time: 9085-9061 OT Time Calculation (min): 24 min Charges:  OT General Charges $OT Visit: 1 Visit OT Evaluation $OT Eval Low Complexity: 1 Low OT Treatments $Self Care/Home Management : 8-22 mins  Elston Slot, M.S. OTR/L  12/22/23, 9:50 AM  ascom [phone]

## 2023-12-22 NOTE — Plan of Care (Signed)
°  Problem: Education: °Goal: Knowledge of General Education information will improve °Description: Including pain rating scale, medication(s)/side effects and non-pharmacologic comfort measures °Outcome: Progressing °  °Problem: Clinical Measurements: °Goal: Ability to maintain clinical measurements within normal limits will improve °Outcome: Progressing °  °Problem: Clinical Measurements: °Goal: Diagnostic test results will improve °Outcome: Progressing °  °Problem: Coping: °Goal: Level of anxiety will decrease °Outcome: Progressing °  °

## 2023-12-22 NOTE — Progress Notes (Signed)
 [facility]  - [facility]   PATIENT NAME: Debbie Sosa    MR#:  [id number]  DATE OF BIRTH:  1975/02/14  SUBJECTIVE:  patient seen earlier. Sitting up in the recliner work with OT earlier. Patient also worked with PT later in the day. No fever. Does have some left hip pain.    VITALS:  Blood pressure 104/71, pulse 92, temperature 97.7 F (36.5 C), temperature source Oral, resp. rate 18, height 5' 1 (1.549 m), weight 91.6 kg, SpO2 99%.  PHYSICAL EXAMINATION:   GENERAL:  48 y.o.-year-old patient with no acute distress. Obese LUNGS: Normal breath sounds bilaterally, no wheezing CARDIOVASCULAR: S1, S2 normal. No murmur   ABDOMEN: Soft, nontender, nondistended. Bowel sounds present.  EXTREMITIES: No  edema b/l.   Left thigh surgical dressing NEUROLOGIC: nonfocal  patient is alert and awake  LABORATORY PANEL:  CBC Recent Labs  Lab 12/20/23 2008  WBC 9.0  HGB 11.7*  HCT 34.9*  PLT 284    Chemistries  Recent Labs  Lab 12/20/23 2008  NA 139  K 3.6  CL 104  CO2 25  GLUCOSE 105*  BUN 16  CREATININE 0.77  CALCIUM 9.0   Cardiac Enzymes No results for input(s): TROPONINI in the last 168 hours. RADIOLOGY:  DG HIP UNILAT W OR W/O PELVIS 2-3 VIEWS LEFT Result Date: 12/21/2023 CLINICAL DATA:  Fluoro guided aspiration of left hip. History of total hip arthroplasty, left. EXAM: DG HIP (WITH OR WITHOUT PELVIS) 2-3V LEFT COMPARISON:  Radiograph yesterday FINDINGS: Two fluoroscopic spot views of the left hip submitted. A needle projects over the femoral neck component of hip arthroplasty. I was not involved during this procedure. Fluoroscopy time 1 seconds. Dose 1.53 mGy. IMPRESSION: Fluoroscopic spot views during left hip aspiration. Electronically Signed   By: Andrea Gasman M.D.   On: 12/21/2023 17:17   DG C-Arm 1-60 Min-No Report Result Date: 12/21/2023 Fluoroscopy was utilized by the requesting physician.  No radiographic interpretation.   CT  ANGIO LOWER EXT BILAT W &/OR WO CONTRAST Result Date: 12/21/2023 CLINICAL DATA:  AVM, thigh Briefly, reported history of LEFT hip arthroplasty 09/21/2023 and had completed 4 weeks of prophylactic Lovenox  in early October presented to hospital with left thigh sudden onset of pain while walking with difficulty weightbearing with numbness of the left calf and foot. CT scan of the femur showed large hematoma and edema in the left thigh and the anterior compartment musculature. EXAM: CTA OF THE LOWER BILATERAL EXTREMITY WITH CONTRAST TECHNIQUE: Multidetector CT imaging of the lower bilateral extremity was performed according to the standard protocol following intravenous contrast administration. RADIATION DOSE REDUCTION: This exam was performed according to the departmental dose-optimization program which includes automated exposure control, adjustment of the mA and/or kV according to patient size and/or use of iterative reconstruction technique. COMPARISON:  CT LEFT lower extremity, 12/20/2023. CONTRAST:  OMNIPAQUE  IOHEXOL  350 MG/ML SOLN FINDINGS: Suboptimal evaluation, with poor visualization of the proximal LEFT thigh secondary to streak and beam hardening artifact from hip arthroplasty. VASCULAR Patent BILATERAL distal common iliac internal and external iliac arteries, CFA, SFA, profunda, and popliteal arteries. Intact at least 2-vessel runoff to the ankles. No pseudoaneurysm or CTA evidence of active extravasation at the proximal LEFT thigh. NONVASCULAR Pelvis: Imaged portions of the bowel are nondilated. IUD. Bladder is within normal limits. RIGHT lower extremity: No soft tissue abnormality within the imaged RIGHT lower extremity. Ligaments and tendons are suboptimally assessed by CT. LEFT lower extremity: Proximal LEFT lateral  thigh incisional scar with underlying subcutaneous fat stranding, and similar edematous appearance of the anterior compartment musculature. No focal drainable collection. Punctate  radiodense focus at the proximal LEFT thigh, present on noncontrast imaging, and likely postsurgical. Ligaments and tendons are suboptimally assessed by CT. Musculoskeletal: LEFT hip arthroplasty. No acute osseous abnormality within the imaged pelvis or lower extremities. IMPRESSION: Suboptimal evaluation, within these constraints; 1. No pseudoaneurysm or CTA evidence of active extravasation at the proximal LEFT thigh. 2. Postsurgical changes of LEFT hip arthroplasty, with incisional scar at the thigh and underlying fat stranding with edema of the anterior compartment musculature. No focal drainable collection. Electronically Signed   By: Thom Hall M.D.   On: 12/21/2023 14:19   CT Lumbar Spine Wo Contrast Result Date: 12/21/2023 EXAM: CT OF THE LUMBAR SPINE WITHOUT CONTRAST 12/20/2023 11:53:51 PM TECHNIQUE: CT of the lumbar spine was performed without the administration of intravenous contrast. Multiplanar reformatted images are provided for review. Automated exposure control, iterative reconstruction, and/or weight based adjustment of the mA/kV was utilized to reduce the radiation dose to as low as reasonably achievable. COMPARISON: None available. CLINICAL HISTORY: Severe left leg pain, possible radiculopathy. FINDINGS: BONES AND ALIGNMENT: Normal vertebral body heights. No acute fracture or suspicious bone lesion. Normal alignment. DEGENERATIVE CHANGES: Intervertebral disc height is preserved. Moderate facet arthrosis, left greater than right at L4-L5. Moderate left and severe right facet arthrosis at L5-S1. Broad-based disc bulge at L3-L4 and L4-L5 with associated laminar hypertrophy resulting in mild central canal stenosis within the subforaminal zone at L3-L4 and moderate central canal stenosis within the subforaminal zone at L4-L5. The spinal canal is otherwise widely patent. No significant neural foraminal narrowing. SOFT TISSUES: Surgical changes of gastric bypass were identified. A 3 mm  nonobstructing calculus centered within the interpolar region of the right kidney. IMPRESSION: 1. Broad-based disc bulge at L3-L4 with associated laminar hypertrophy resulting in mild central canal stenosis within the subforaminal zone. 2. Broad-based disc bulge at L4-L5 with associated laminar hypertrophy resulting in moderate central canal stenosis within the subforaminal zone. 3. Severe right facet arthrosis at L5-S1, with moderate left facet arthrosis at L5-S1 and moderate facet arthrosis at L4-L5. Electronically signed by: Dorethia Molt MD 12/21/2023 12:05 AM EST RP Workstation: [id number]   CT FEMUR LEFT WO CONTRAST Result Date: 12/21/2023 EXAM: CT OF THE LEFT FEMUR, WITHOUT IV CONTRAST 12/20/2023 11:53:51 PM TECHNIQUE: Axial images were acquired through the left femur without IV contrast. Reformatted images were reviewed. Automated exposure control, iterative reconstruction, and/or weight based adjustment of the mA/kV was utilized to reduce the radiation dose to as low as reasonably achievable. COMPARISON: Comparison with same day x-ray and CT 08/22/2023. CLINICAL HISTORY: Severe left leg pain of unclear etiology, consider muscle hematoma, unlikely occult fracture. FINDINGS: BONES: No acute fracture or focal osseous lesion. Left hip arthroplasty without evidence of loosening. JOINTS: No dislocation. The joint spaces are normal. No knee joint effusion. SOFT TISSUES: Extensive heterogeneous edema and hyperdense fluid within the anterior compartment musculature of the left thigh. The margins are not well defined but the hematoma measures approximately 6.3 x 4.6 x 24.7 cm. Assessment for active bleeding is impossible on noncontrast exam. If there is concern for active bleeding. Consider CTA of the left lower extremity. IMPRESSION: 1. Large hematoma and edema in the left thigh anterior compartment musculature. Clinical assessment for compartment syndrome is recommended. 2. If there is concern for active  bleeding, consider CTA of the left lower extremity. Electronically signed by: Norman Gatlin MD  12/21/2023 12:05 AM EST RP Workstation: [id number]   US  Venous Img Lower Unilateral Left Result Date: 12/20/2023 CLINICAL DATA:  Left leg pain. EXAM: LEFT LOWER EXTREMITY VENOUS DOPPLER ULTRASOUND TECHNIQUE: Gray-scale sonography with graded compression, as well as color Doppler and duplex ultrasound were performed to evaluate the lower extremity deep venous systems from the level of the common femoral vein and including the common femoral, femoral, profunda femoral, popliteal and calf veins including the posterior tibial, peroneal and gastrocnemius veins when visible. The superficial great saphenous vein was also interrogated. Spectral Doppler was utilized to evaluate flow at rest and with distal augmentation maneuvers in the common femoral, femoral and popliteal veins. COMPARISON:  July 08, 2023 FINDINGS: Contralateral Common Femoral Vein: Respiratory phasicity is normal and symmetric with the symptomatic side. No evidence of thrombus. Normal compressibility. Common Femoral Vein: No evidence of thrombus. Normal compressibility, respiratory phasicity and response to augmentation. Saphenofemoral Junction: No evidence of thrombus. Normal compressibility and flow on color Doppler imaging. Profunda Femoral Vein: No evidence of thrombus. Normal compressibility and flow on color Doppler imaging. Femoral Vein: No evidence of thrombus. Normal compressibility, respiratory phasicity and response to augmentation. Popliteal Vein: No evidence of thrombus. Normal compressibility, respiratory phasicity and response to augmentation. Calf Veins: No evidence of thrombus. Normal compressibility and flow on color Doppler imaging. Superficial Great Saphenous Vein: No evidence of thrombus. Normal compressibility. Venous Reflux:  None. Other Findings:  None. IMPRESSION: No evidence of deep venous thrombosis within the LEFT lower extremity.  Electronically Signed   By: Suzen Dials M.D.   On: 12/20/2023 23:41   DG Hip Unilat W or Wo Pelvis 2-3 Views Left Result Date: 12/20/2023 EXAM: 2 or more VIEW(S) XRAY OF THE PELVIS AND LEFT HIP 12/20/2023 07:44:25 PM COMPARISON: None available. CLINICAL HISTORY: L hip pain, left hip replacement about 3 months ago FINDINGS: BONES AND JOINTS: SI joints are symmetric. No acute fracture. Bilateral hips demonstrate normal alignment. Left hip arthroplasty noted. Intact and well seated hardware. Surgical clips overlie the right hemipelvis. SOFT TISSUES: IUD. IMPRESSION: 1. Left hip arthroplasty with intact and well-seated hardware. Electronically signed by: Norman Gatlin MD 12/20/2023 08:46 PM EST RP Workstation: [id number]   DG Femur Min 2 Views Left Result Date: 12/20/2023 EXAM: 2 VIEW(S) XRAY OF THE LEFT FEMUR 12/20/2023 07:44:25 PM COMPARISON: None available. CLINICAL HISTORY: L leg pain FINDINGS: BONES AND JOINTS: Total left hip arthroplasty in place. No acute fracture. SOFT TISSUES: The soft tissues are unremarkable. IMPRESSION: 1. No acute osseous abnormality. Electronically signed by: Norman Gatlin MD 12/20/2023 08:45 PM EST RP Workstation: [id number]    Assessment and Plan  MAKYA PHILLIS is a 48 y.o. female with medical history significant for Rheumatoid arthritis, anemia with iron  deficiency and vitamin B12 deficiency, s/p gastric bypass, lymphedema, prior DVT/PE, lumbar radiculopathy, multiple medication allergies, who underwent left hip arthroplasty 09/21/2023 and had completed 4 weeks of prophylactic Lovenox  in early October presented to hospital with left thigh sudden onset of pain while walking with difficulty weightbearing with numbness of the left calf and foot .  She states that she noticed in the previous week she seemed to be having more difficulty at her physical therapy sessions and also noted that her pants leg was fitting more tightly tighter on the left thigh than the right.   In the ED vitals were stable.  Hemoglobin was 11.3.  CT scan of the femur showed large hematoma and edema in the left thigh and the anterior compartment musculature.  CT  lumbar spine showed DJD.  Lower extremity DVT was negative for DVT.  Patient was treated with fentanyl  Tylenol  Flexeril .  Orthopedics was consulted and patient was considered for admission to the hospital for further evaluation and treatment.    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  Orthopedics has been consulted for possible compartment syndrome.  CTA has been requested.  Since patient has history of hives with iodinated contrast undergoing preparation.  Orthopedic following and might need surgical intervention/fasciotomy. 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      Multiple drug allergies On EpiPen  Monitor for allergies.  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  Follow-up with rheumatology as outpatient.   Lymphedema, chronic Continue supportive care with compressive stockings.   History of gastric bypass History of iron  deficiency/vitamin B12 deficiency. Hemoglobin of 11.3 today.   -- Resumed PO B12  Vitamin D  deficiency -- resumed PO cholecalciferol   If remains stable will discharge tomorrow  Procedures: Left thigh ultrasound and exam under anesthesia, left hip fluoroscopic  guided attempted arthrocentesis  Family communication : none Consults : orthopedic Dr. Lorelle CODE STATUS: full DVT Prophylaxis : SCD Level of care: Med-Surg Status is: Inpatient Remains inpatient appropriate because: continue monitor one more day. Remains stable discharge tomorrow    TOTAL TIME TAKING CARE OF THIS PATIENT: 35 minutes.  >50% time spent on counselling and coordination of care  Note: This dictation was prepared with Dragon dictation along with smaller phrase technology. Any transcriptional errors that result from this process are unintentional.  Leita Blanch M.D    [facility]   CC: Primary care physician; Auston Reyes BIRCH, MD

## 2023-12-23 ENCOUNTER — Encounter (INDEPENDENT_AMBULATORY_CARE_PROVIDER_SITE_OTHER): Payer: Self-pay | Admitting: Vascular Surgery

## 2023-12-23 ENCOUNTER — Other Ambulatory Visit: Payer: Self-pay

## 2023-12-23 DIAGNOSIS — M60852 Other myositis, left thigh: Secondary | ICD-10-CM | POA: Diagnosis not present

## 2023-12-23 DIAGNOSIS — M0579 Rheumatoid arthritis with rheumatoid factor of multiple sites without organ or systems involvement: Secondary | ICD-10-CM | POA: Diagnosis not present

## 2023-12-23 DIAGNOSIS — M79605 Pain in left leg: Secondary | ICD-10-CM | POA: Diagnosis not present

## 2023-12-23 MED ORDER — CYANOCOBALAMIN 1000 MCG PO TABS: 1000.0000 ug | ORAL_TABLET | Freq: Every day | ORAL | 1 refills | Status: AC

## 2023-12-23 MED ORDER — PREDNISONE 10 MG PO TABS: ORAL_TABLET | ORAL | 0 refills | Status: DC

## 2023-12-23 MED ORDER — VITAMIN D3 25 MCG PO TABS: 1000.0000 [IU] | ORAL_TABLET | Freq: Every day | ORAL | 1 refills | Status: AC

## 2023-12-23 MED ORDER — OXYCODONE HCL 5 MG PO TABS: 5.0000 mg | ORAL_TABLET | Freq: Four times a day (QID) | ORAL | 0 refills | Status: AC | PRN

## 2023-12-23 MED ORDER — METHOCARBAMOL 500 MG PO TABS: 500.0000 mg | ORAL_TABLET | Freq: Four times a day (QID) | ORAL | 0 refills | Status: AC | PRN

## 2023-12-23 NOTE — Discharge Instructions (Signed)
 Patient would like to go to [facility] out patient PT

## 2023-12-23 NOTE — TOC CM/SW Note (Signed)
     [facility] REFERR[facility]        Occupational Therapy * Physical Therapy * Speech Therapy                           DATE: 12/23/2023  PATIENT NAME: Debbie Sosa  PATIENT MRN: [id number]       DIAGNOSIS/DIAGNOSIS CODE: Intramuscular Hematoma, Left Thigh, Myositis of Left Thigh, Left Leg Pain  DATE OF DISCHARGE: 12/23/2023       PRIMARY CARE PHYSICIAN:   Auston Reyes BIRCH, MD    PCP PHONE: [phone]     Dear Provider: [facility]  Fax: [phone]   I certify that I have examined this patient and that occupational/physical/speech therapy is necessary on an outpatient basis.    The patient has expressed interest in completing their recommended course of therapy at your  location.  Once a formal order from the patient's primary care physician has been obtained, please  contact him/her to schedule an appointment for evaluation at your earliest convenience.   [X]   Physical Therapy Evaluate and Treat  [  ]  Occupational Therapy Evaluate and Treat  [  ]  Speech Therapy Evaluate and Treat         The patient's primary care physician (listed above) must furnish and be responsible for a formal order such that the recommended services may be furnished while under the primary physician's care, and that the plan of care will be established and reviewed every 30 days (or more often if condition necessitates).

## 2023-12-23 NOTE — Discharge Summary (Signed)
 Physician Discharge Summary   Patient: LENNA HAGARTY MRN: [id number] DOB: 03/08/1975  Admit date:     12/20/2023  Discharge date: 12/23/23  Discharge Physician: Leita Blanch   PCP: Auston Reyes BIRCH, MD   Recommendations at discharge:    F/u Dr Lorelle in 1-2 weeks Out pt PT at [facility]  Discharge Diagnoses: Principal Problem:   Intramuscular hematoma, left thigh Active Problems:   History of venous thromboembolism (DVT/PE)   Lumbar radiculitis   Multiple drug allergies   Rheumatoid arthritis involving multiple sites with positive rheumatoid factor (HCC)   Lymphedema, chronic   History of gastric bypass   Myositis of left thigh   Left leg pain   essica L Kulak is a 48 y.o. female with medical history significant for Rheumatoid arthritis, anemia with iron  deficiency and vitamin B12 deficiency, s/p gastric bypass, lymphedema, prior DVT/PE, lumbar radiculopathy, multiple medication allergies, who underwent left hip arthroplasty 09/21/2023 and had completed 4 weeks of prophylactic Lovenox  in early October presented to hospital with left thigh sudden onset of pain while walking with difficulty weightbearing with numbness of the left calf and foot .  She states that she noticed in the previous week she seemed to be having more difficulty at her physical therapy sessions and also noted that her pants leg was fitting more tightly tighter on the left thigh than the right.  In the ED vitals were stable.  Hemoglobin was 11.3.  CT scan of the femur showed large hematoma and edema in the left thigh and the anterior compartment musculature.  CT lumbar spine showed DJD.  Lower extremity DVT was negative for DVT.  Patient was treated with fentanyl  Tylenol  Flexeril .  Orthopedics was consulted and patient was considered for admission to the hospital for further evaluation and treatment.    Intramuscular hematoma, left thigh History of left hip arthroplasty 09/21/2023 Intractable pain with ambulatory  dysfunction CT showing large hematoma with edema.  Orthopedics has been consulted for possible compartment syndrome.  CTA has been requested.  Since patient has history of hives with iodinated contrast undergoing preparation.  Orthopedic following and might need surgical intervention/fasciotomy. 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      Multiple drug allergies On EpiPen  Monitor for allergies.  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  Follow-up with rheumatology as outpatient.   Lymphedema, chronic Continue supportive care with compressive stockings.   History of gastric bypass History of iron  deficiency/vitamin B12 deficiency. Hemoglobin of 11.3 today.   -- Resumed PO B12   Vitamin D  deficiency -- resumed PO cholecalciferol    Overall remains stable. D/c home --pt agreeable   Procedures: Left thigh ultrasound and exam under anesthesia, left hip fluoroscopic guided attempted arthrocentesis  Family communication : none Consults : orthopedic Dr. Lorelle CODE STATUS: full DVT Prophylaxis : SCD     Pain control -   Controlled Substance Reporting System database was reviewed. and patient was instructed, not to drive, operate heavy machinery, perform activities at heights, swimming or  participation in water  activities or provide baby-sitting services while on Pain, Sleep and Anxiety Medications; until their outpatient Physician has advised to do so again.  Also recommended to not to take more than prescribed Pain, Sleep and Anxiety Medications.  Disposition: Home Diet recommendation:  Discharge Diet Orders (From admission, onward)     Start     Ordered   12/23/23 0000  Diet - low sodium heart healthy        12/23/23 1018            DISCHARGE MEDICATION: Allergies as of 12/23/2023       Reactions   Amoxicillin  Anaphylaxis   Throat swelling Sob, itching amoxicillin    Avocado Anaphylaxis   Cephalexin  Hives, Rash   Fexofenadine Shortness Of Breath   Iodinated Contrast Media Hives   Ketorolac  Hives   Morphine Anaphylaxis, Dermatitis   Penicillins Swelling   Throat swelling Product containing penicillin  (product)   Phenazopyridine Rash, Dermatitis, Other (See Comments)   phenazopyridine hydrochloride   Promethazine Hives   Diflucan  [fluconazole ] Rash   Cetirizine Other (See Comments)   Skin eruptures Patient denies   Diclofenac  Sodium Hives   Diclofenac  Sodium Hives   Doxycycline Other (See Comments)   Hepatitis B Vaccine Dermatitis   Latex    With condoms   Levofloxacin  Other (See Comments)   levofloxacin    Nsaids Other (See Comments)   Other Hives, Dermatitis   Pain medicine that starts with an L   Tramadol  Hives   Can take with benadryl    Hepatitis B Virus Vaccines Rash   Misc. Sulfonamide Containing Compounds Rash   Morphine And Codeine Rash   Naproxen Rash, Dermatitis   Oxycodone -acetaminophen  Itching   Pyridium [phenazopyridine Hcl] Rash   Sulfa Antibiotics Rash, Other (See Comments)   Sulfasalazine Rash, Dermatitis   Vioxx [rofecoxib] Rash        Medication List     STOP taking these medications    azithromycin  250 MG tablet Commonly known as: ZITHROMAX    cyanocobalamin  1000 MCG/ML injection Commonly known as: VITAMIN B12 Replaced by: cyanocobalamin  1000 MCG tablet   esomeprazole  40 MG capsule Commonly known as: NEXIUM    Vitamin D  (Ergocalciferol ) 1.25 MG (50000 UNIT) Caps  capsule Commonly known as: DRISDOL        TAKE these medications    albuterol  108 (90 Base) MCG/ACT inhaler Commonly known as: VENTOLIN  HFA Inhale 1-2 puffs INH Q4-6hr prn for chest tightness, cough, wheezing, SOB/DOE.   cyanocobalamin  1000 MCG tablet Take 1 tablet (1,000 mcg total) by mouth daily. Start taking on: December 24, 2023 Replaces: cyanocobalamin  1000 MCG/ML injection   EPINEPHrine  0.3 mg/0.3 mL Soaj injection Commonly known as: EpiPen  2-Pak Inject 0.3 mLs (0.3 mg total) into the muscle as needed for anaphylaxis.   levonorgestrel  20 MCG/DAY Iud Commonly known as: MIRENA  by Intrauterine route.   methocarbamol  500 MG tablet Commonly known as: ROBAXIN  Take 1 tablet (500 mg total) by mouth every 6 (six) hours as needed for muscle spasms.   omeprazole  40 MG capsule Commonly known as: PRILOSEC Take 40 mg by mouth daily as needed.   oxyCODONE  5 MG immediate release tablet Commonly known as: Oxy IR/ROXICODONE  Take 1 tablet (5 mg total) by mouth every 6 (six) hours as needed for up to 5 days for moderate pain (pain score 4-6).   predniSONE  10 MG tablet Commonly known as: DELTASONE  Take 5 tablets (50 mg total) by mouth daily with breakfast for 1 day, THEN 4 tablets (40 mg total) daily with  breakfast for 1 day, THEN 3 tablets (30 mg total) daily with breakfast for 1 day, THEN 2 tablets (20 mg total) daily with breakfast for 1 day, THEN 1 tablet (10 mg total) daily with breakfast for 1 day. Start taking on: December 24, 2023 What changed:  medication strength See the new instructions.   vitamin D3 25 MCG tablet Commonly known as: CHOLECALCIFEROL  Take 1 tablet (1,000 Units total) by mouth daily. Start taking on: December 24, 2023        Follow-up Information     Sparks, Reyes BIRCH, MD. Schedule an appointment as soon as possible for a visit in 1 week(s).   Specialty: Internal Medicine Contact information: [address] [facility] [place]  [address] [phone]         Lorelle Hussar, MD. Schedule an appointment as soon as possible for a visit in 1 week(s).   Specialty: Orthopedic Surgery Contact information: [address] [place] [address] [phone]                 Condition at discharge: fair  The results of significant diagnostics from this hospitalization (including imaging, microbiology, ancillary and laboratory) are listed below for reference.   Imaging Studies: DG HIP UNILAT W OR W/O PELVIS 2-3 VIEWS LEFT Result Date: 12/21/2023 CLINICAL DATA:  Fluoro guided aspiration of left hip. History of total hip arthroplasty, left. EXAM: DG HIP (WITH OR WITHOUT PELVIS) 2-3V LEFT COMPARISON:  Radiograph yesterday FINDINGS: Two fluoroscopic spot views of the left hip submitted. A needle projects over the femoral neck component of hip arthroplasty. I was not involved during this procedure. Fluoroscopy time 1 seconds. Dose 1.53 mGy. IMPRESSION: Fluoroscopic spot views during left hip aspiration. Electronically Signed   By: Andrea Gasman M.D.   On: 12/21/2023 17:17   DG C-Arm 1-60 Min-No Report Result Date: 12/21/2023 Fluoroscopy was utilized by the requesting physician.  No radiographic interpretation.   CT ANGIO LOWER EXT BILAT W &/OR WO CONTRAST Result Date: 12/21/2023 CLINICAL DATA:  AVM, thigh Briefly, reported history of LEFT hip arthroplasty 09/21/2023 and had completed 4 weeks of prophylactic Lovenox  in early October presented to hospital with left thigh sudden onset of pain while walking with difficulty weightbearing with numbness of the left calf and foot. CT scan of the femur showed large hematoma and edema in the left thigh and the anterior compartment musculature. EXAM: CTA OF THE LOWER BILATERAL EXTREMITY WITH CONTRAST TECHNIQUE: Multidetector CT imaging of the lower bilateral extremity was performed according to the standard protocol following intravenous contrast administration. RADIATION DOSE  REDUCTION: This exam was performed according to the departmental dose-optimization program which includes automated exposure control, adjustment of the mA and/or kV according to patient size and/or use of iterative reconstruction technique. COMPARISON:  CT LEFT lower extremity, 12/20/2023. CONTRAST:  OMNIPAQUE  IOHEXOL  350 MG/ML SOLN FINDINGS: Suboptimal evaluation, with poor visualization of the proximal LEFT thigh secondary to streak and beam hardening artifact from hip arthroplasty. VASCULAR Patent BILATERAL distal common iliac internal and external iliac arteries, CFA, SFA, profunda, and popliteal arteries. Intact at least 2-vessel runoff to the ankles. No pseudoaneurysm or CTA evidence of active extravasation at the proximal LEFT thigh. NONVASCULAR Pelvis: Imaged portions of the bowel are nondilated. IUD. Bladder is within normal limits. RIGHT lower extremity: No soft tissue abnormality within the imaged RIGHT lower extremity. Ligaments and tendons are suboptimally assessed by CT. LEFT lower extremity: Proximal LEFT lateral thigh incisional scar with underlying subcutaneous fat stranding,  and similar edematous appearance of the anterior compartment musculature. No focal drainable collection. Punctate radiodense focus at the proximal LEFT thigh, present on noncontrast imaging, and likely postsurgical. Ligaments and tendons are suboptimally assessed by CT. Musculoskeletal: LEFT hip arthroplasty. No acute osseous abnormality within the imaged pelvis or lower extremities. IMPRESSION: Suboptimal evaluation, within these constraints; 1. No pseudoaneurysm or CTA evidence of active extravasation at the proximal LEFT thigh. 2. Postsurgical changes of LEFT hip arthroplasty, with incisional scar at the thigh and underlying fat stranding with edema of the anterior compartment musculature. No focal drainable collection. Electronically Signed   By: Thom Hall M.D.   On: 12/21/2023 14:19   CT Lumbar Spine Wo  Contrast Result Date: 12/21/2023 EXAM: CT OF THE LUMBAR SPINE WITHOUT CONTRAST 12/20/2023 11:53:51 PM TECHNIQUE: CT of the lumbar spine was performed without the administration of intravenous contrast. Multiplanar reformatted images are provided for review. Automated exposure control, iterative reconstruction, and/or weight based adjustment of the mA/kV was utilized to reduce the radiation dose to as low as reasonably achievable. COMPARISON: None available. CLINICAL HISTORY: Severe left leg pain, possible radiculopathy. FINDINGS: BONES AND ALIGNMENT: Normal vertebral body heights. No acute fracture or suspicious bone lesion. Normal alignment. DEGENERATIVE CHANGES: Intervertebral disc height is preserved. Moderate facet arthrosis, left greater than right at L4-L5. Moderate left and severe right facet arthrosis at L5-S1. Broad-based disc bulge at L3-L4 and L4-L5 with associated laminar hypertrophy resulting in mild central canal stenosis within the subforaminal zone at L3-L4 and moderate central canal stenosis within the subforaminal zone at L4-L5. The spinal canal is otherwise widely patent. No significant neural foraminal narrowing. SOFT TISSUES: Surgical changes of gastric bypass were identified. A 3 mm nonobstructing calculus centered within the interpolar region of the right kidney. IMPRESSION: 1. Broad-based disc bulge at L3-L4 with associated laminar hypertrophy resulting in mild central canal stenosis within the subforaminal zone. 2. Broad-based disc bulge at L4-L5 with associated laminar hypertrophy resulting in moderate central canal stenosis within the subforaminal zone. 3. Severe right facet arthrosis at L5-S1, with moderate left facet arthrosis at L5-S1 and moderate facet arthrosis at L4-L5. Electronically signed by: Dorethia Molt MD 12/21/2023 12:05 AM EST RP Workstation: [id number]   CT FEMUR LEFT WO CONTRAST Result Date: 12/21/2023 EXAM: CT OF THE LEFT FEMUR, WITHOUT IV CONTRAST 12/20/2023 11:53:51  PM TECHNIQUE: Axial images were acquired through the left femur without IV contrast. Reformatted images were reviewed. Automated exposure control, iterative reconstruction, and/or weight based adjustment of the mA/kV was utilized to reduce the radiation dose to as low as reasonably achievable. COMPARISON: Comparison with same day x-ray and CT 08/22/2023. CLINICAL HISTORY: Severe left leg pain of unclear etiology, consider muscle hematoma, unlikely occult fracture. FINDINGS: BONES: No acute fracture or focal osseous lesion. Left hip arthroplasty without evidence of loosening. JOINTS: No dislocation. The joint spaces are normal. No knee joint effusion. SOFT TISSUES: Extensive heterogeneous edema and hyperdense fluid within the anterior compartment musculature of the left thigh. The margins are not well defined but the hematoma measures approximately 6.3 x 4.6 x 24.7 cm. Assessment for active bleeding is impossible on noncontrast exam. If there is concern for active bleeding. Consider CTA of the left lower extremity. IMPRESSION: 1. Large hematoma and edema in the left thigh anterior compartment musculature. Clinical assessment for compartment syndrome is recommended. 2. If there is concern for active bleeding, consider CTA of the left lower extremity. Electronically signed by: Norman Gatlin MD 12/21/2023 12:05 AM EST RP Workstation: [id number]  US  Venous Img Lower Unilateral Left Result Date: 12/20/2023 CLINICAL DATA:  Left leg pain. EXAM: LEFT LOWER EXTREMITY VENOUS DOPPLER ULTRASOUND TECHNIQUE: Gray-scale sonography with graded compression, as well as color Doppler and duplex ultrasound were performed to evaluate the lower extremity deep venous systems from the level of the common femoral vein and including the common femoral, femoral, profunda femoral, popliteal and calf veins including the posterior tibial, peroneal and gastrocnemius veins when visible. The superficial great saphenous vein was also  interrogated. Spectral Doppler was utilized to evaluate flow at rest and with distal augmentation maneuvers in the common femoral, femoral and popliteal veins. COMPARISON:  July 08, 2023 FINDINGS: Contralateral Common Femoral Vein: Respiratory phasicity is normal and symmetric with the symptomatic side. No evidence of thrombus. Normal compressibility. Common Femoral Vein: No evidence of thrombus. Normal compressibility, respiratory phasicity and response to augmentation. Saphenofemoral Junction: No evidence of thrombus. Normal compressibility and flow on color Doppler imaging. Profunda Femoral Vein: No evidence of thrombus. Normal compressibility and flow on color Doppler imaging. Femoral Vein: No evidence of thrombus. Normal compressibility, respiratory phasicity and response to augmentation. Popliteal Vein: No evidence of thrombus. Normal compressibility, respiratory phasicity and response to augmentation. Calf Veins: No evidence of thrombus. Normal compressibility and flow on color Doppler imaging. Superficial Great Saphenous Vein: No evidence of thrombus. Normal compressibility. Venous Reflux:  None. Other Findings:  None. IMPRESSION: No evidence of deep venous thrombosis within the LEFT lower extremity. Electronically Signed   By: Suzen Dials M.D.   On: 12/20/2023 23:41   DG Hip Unilat W or Wo Pelvis 2-3 Views Left Result Date: 12/20/2023 EXAM: 2 or more VIEW(S) XRAY OF THE PELVIS AND LEFT HIP 12/20/2023 07:44:25 PM COMPARISON: None available. CLINICAL HISTORY: L hip pain, left hip replacement about 3 months ago FINDINGS: BONES AND JOINTS: SI joints are symmetric. No acute fracture. Bilateral hips demonstrate normal alignment. Left hip arthroplasty noted. Intact and well seated hardware. Surgical clips overlie the right hemipelvis. SOFT TISSUES: IUD. IMPRESSION: 1. Left hip arthroplasty with intact and well-seated hardware. Electronically signed by: Norman Gatlin MD 12/20/2023 08:46 PM EST RP  Workstation: [id number]   DG Femur Min 2 Views Left Result Date: 12/20/2023 EXAM: 2 VIEW(S) XRAY OF THE LEFT FEMUR 12/20/2023 07:44:25 PM COMPARISON: None available. CLINICAL HISTORY: L leg pain FINDINGS: BONES AND JOINTS: Total left hip arthroplasty in place. No acute fracture. SOFT TISSUES: The soft tissues are unremarkable. IMPRESSION: 1. No acute osseous abnormality. Electronically signed by: Norman Gatlin MD 12/20/2023 08:45 PM EST RP Workstation: [id number]    Microbiology: Results for orders placed or performed during the hospital encounter of 09/08/23  Surgical pcr screen     Status: None   Collection Time: 09/08/23  9:40 AM   Specimen: Nasal Mucosa; Nasal Swab  Result Value Ref Range Status   MRSA, PCR NEGATIVE NEGATIVE Final   Staphylococcus aureus NEGATIVE NEGATIVE Final    Comment: (NOTE) The Xpert SA Assay (FDA approved for NASAL specimens in patients 79 years of age and older), is one component of a comprehensive surveillance program. It is not intended to diagnose infection nor to guide or monitor treatment. Performed at [facility], [address] [place], [place] [address]     Labs: CBC: Recent Labs  Lab 12/20/23 2008  WBC 9.0  NEUTROABS 6.0  HGB 11.7*  HCT 34.9*  MCV 89.7  PLT 284   Basic Metabolic Panel: Recent Labs  Lab 12/20/23 2008  NA 139  K 3.6  CL  104  CO2 25  GLUCOSE 105*  BUN 16  CREATININE 0.77  CALCIUM 9.0   Discharge time spent: greater than 30 minutes.  Signed: Leita Blanch, MD [facility] 12/23/2023

## 2023-12-23 NOTE — Progress Notes (Signed)
   Subjective: 2 Days Post-Op Procedure(s) (LRB): LEFT HIP EXAMINATION UNDER ANESTHESIA WITH ATTEMPTED FLUOROSCOPIC GUIDED ASPIRATION, LEFT THIGH ULTRASOUND (Left) Patient with continued improvement in left thigh pain from yesterday. She describes thigh as being less bouncy.  She has not attempted ambulation. Overall doing well.  Objective: Vital signs in last 24 hours: Temp:  [97.5 F (36.4 C)-98 F (36.7 C)] 97.8 F (36.6 C) (12/06 0951) Pulse Rate:  [81-112] 84 (12/06 0951) Resp:  [16-18] 16 (12/06 0951) BP: (104-132)/(59-90) 108/70 (12/06 0951) SpO2:  [96 %-100 %] 98 % (12/06 0951)  Intake/Output from previous day: 12/05 0701 - 12/06 0700 In: 360 [P.O.:360] Out: -  Intake/Output this shift: No intake/output data recorded.  Recent Labs    12/20/23 2008  HGB 11.7*   Recent Labs    12/20/23 2008  WBC 9.0  RBC 3.89  HCT 34.9*  PLT 284   Recent Labs    12/20/23 2008  NA 139  K 3.6  CL 104  CO2 25  BUN 16  CREATININE 0.77  GLUCOSE 105*  CALCIUM 9.0   Recent Labs    12/20/23 2008  INR 1.0    EXAM General - Patient is Alert, Appropriate, and Oriented Extremity - Neurovascular intact Sensation intact distally Intact pulses distally Dorsiflexion/Plantar flexion intact No cellulitis present Compartment soft Ace wrap intact. ,no tenderness to the thigh Motor Function - intact, moving foot and toes well on exam.   Past Medical History:  Diagnosis Date   Anemia    Antiphospholipid antibody positive    Arthritis    Asthma    seasonal   Chronic venous insufficiency    COVID-19 2020   DDD (degenerative disc disease), lumbar    GERD (gastroesophageal reflux disease)    Headache    h/o migraines   History of hiatal hernia    repaired with Gastric bypass   History of kidney stones    Iron  deficiency    recieves iron  infusions every 3 months at the cancer center   Lymphedema    bil legs-sees [facility]   Nausea and vomiting 10/14/2015   Obesity     Pneumonia    Pulmonary embolism (HCC)    after 2nd c-section   Sleep apnea    had gastric bypass and does not need to use cpap since surgery   Vitamin B12 deficiency     Assessment/Plan:   2 Days Post-Op Procedure(s) (LRB): LEFT HIP EXAMINATION UNDER ANESTHESIA WITH ATTEMPTED FLUOROSCOPIC GUIDED ASPIRATION, LEFT THIGH ULTRASOUND (Left) Principal Problem:   Intramuscular hematoma, left thigh Active Problems:   History of gastric bypass   Rheumatoid arthritis involving multiple sites with positive rheumatoid factor (HCC)   Lymphedema, chronic   Lumbar radiculitis   History of venous thromboembolism (DVT/PE)   Multiple drug allergies   Myositis of left thigh   Left leg pain  Estimated body mass index is 38.17 kg/m as calculated from the following:   Height as of this encounter: 5' 1 (1.549 m).   Weight as of this encounter: 91.6 kg. Up with therapy, WBAT LLE Much improved left thigh swelling.  Continue with PT. Recommend medrol  Dosepak at discharge. Patient can use ace wrap as needed for comfort. Follow up with [facility] in 1 week for recheck    T. Medford Amber, PA-C [facility] 12/23/2023, 10:51 AM

## 2023-12-23 NOTE — TOC Transition Note (Signed)
 Transition of Care [facility]) - Discharge Note   Patient Details  Name: Debbie Sosa MRN: [id number] Date of Birth: 06/26/1975  [facility]) CM/SW Contact:  Victory Jackquline RAMAN, RN Phone Number: 12/23/2023, 1:09 PM   Clinical Narrative:   RNCM received a message from the MD via secure chat informing me that she needed me that the patient has chose to go to [facility] and to send her an Outpatient Referral for her to co-sign. Referral sent to MD to sign. Referral faxed to [phone]. Pt has discharge orders, no further concerns. RNCM signing off.    Final next level of care: Home/Self Care Barriers to Discharge: Barriers Resolved   Patient Goals and CMS Choice            Discharge Placement                Patient to be transferred to facility by: Mother Name of family member notified: Erminio Patient and family notified of of transfer: 12/23/23  Discharge Plan and Services Additional resources added to the After Visit Summary for                                       Social Drivers of Health (SDOH) Interventions SDOH Screenings   Food Insecurity: No Food Insecurity (12/21/2023)  Recent Concern: Food Insecurity - Food Insecurity Present (10/02/2023)   Received from [facility]  Housing: Low Risk  (12/21/2023)  Recent Concern: Housing - High Risk (10/02/2023)   Received from [facility]  Transportation Needs: No Transportation Needs (12/21/2023)  Utilities: Not At Risk (12/21/2023)  Depression (PHQ2-9): Low Risk  (11/28/2023)  Financial Resource Strain: Medium Risk (10/02/2023)   Received from [facility]  Social Connections: Socially Isolated (08/22/2023)  Tobacco Use: Low Risk  (12/20/2023)     Readmission Risk Interventions     No data to display

## 2023-12-25 NOTE — Anesthesia Postprocedure Evaluation (Signed)
 Anesthesia Post Note  Patient: Debbie Sosa  Procedure(s) Performed: LEFT HIP EXAMINATION UNDER ANESTHESIA WITH ATTEMPTED FLUOROSCOPIC GUIDED ASPIRATION, LEFT THIGH ULTRASOUND (Left: Hip)  Patient location during evaluation: PACU Anesthesia Type: General Level of consciousness: awake and alert Pain management: pain level controlled Vital Signs Assessment: post-procedure vital signs reviewed and stable Respiratory status: spontaneous breathing, nonlabored ventilation, respiratory function stable and patient connected to nasal cannula oxygen Cardiovascular status: blood pressure returned to baseline and stable Postop Assessment: no apparent nausea or vomiting Anesthetic complications: no   No notable events documented.   Last Vitals:  Vitals:   12/23/23 0439 12/23/23 0951  BP: 109/66 108/70  Pulse: 81 84  Resp: 16 16  Temp: 36.7 C 36.6 C  SpO2: 96% 98%    Last Pain:  Vitals:   12/23/23 0920  TempSrc:   PainSc: 5                  [name]

## 2023-12-27 ENCOUNTER — Ambulatory Visit
Admission: RE | Admit: 2023-12-27 | Discharge: 2023-12-27 | Disposition: A | Discharge status: Home / Self Care | Source: Ambulatory Visit | Attending: Orthopedic Surgery | Admitting: Orthopedic Surgery

## 2023-12-27 ENCOUNTER — Other Ambulatory Visit: Payer: Self-pay | Admitting: Orthopedic Surgery

## 2023-12-27 DIAGNOSIS — M7989 Other specified soft tissue disorders: Secondary | ICD-10-CM

## 2023-12-27 DIAGNOSIS — M5416 Radiculopathy, lumbar region: Secondary | ICD-10-CM | POA: Insufficient documentation

## 2023-12-27 NOTE — Progress Notes (Signed)
 HPI:  Debbie Sosa is a 48 y.o. female who presents for follow-up after being seen at the hospital this past week.  Patient is 3 months and a week status post left anterior total replacement.  She was doing fantastic with her hip until she developed acute swelling and pain in her severe burning sensation and some muscle spasm in her left thigh.  An initial CT scan in the emergency room showed concern for a possible large hematoma repeat CTA showed no evidence of any fluid collections or hematoma.  Given the elevated inflammatory markers and severe pain in the leg the patient was taken to the operating room for an exam under anesthesia and ultrasound showed no fluid collections at that time in the thigh and a attempted fluoroscopic aspiration of the hip yielded a dry tap with no significant fluid to be found.  Patient was started on steroids and reported initial improvement in pain and symptoms.  She was discharged from the hospital with home physical therapy on muscle relaxers and a prednisone  taper.  As she has tapered off the prednisone  her pain is gone back up to a 10 out of 10 described as a spasm and throbbing pain in her left thigh.  She reports her hip joint itself and her hip incision have had no pain or any issues.  She denies any fevers chills or any other signs or symptoms of systemic infection.  Current Outpatient Medications  Medication Sig Dispense Refill   oxyCODONE  (ROXICODONE ) 5 MG immediate release tablet Take 5 mg by mouth     albuterol  MDI, PROVENTIL , VENTOLIN , PROAIR , HFA 90 mcg/actuation inhaler Inhale 2 inhalations into the lungs every 4 (four) hours as needed for Wheezing 1 each 3   cyanocobalamin  (VITAMIN B12) 1,000 mcg/mL injection Inject 1 mL (1,000 mcg total) into the muscle once a week for 28 days, THEN 1 mL (1,000 mcg total) monthly for 120 days. 8 mL 0   EPINEPHrine  (EPIPEN ) 0.3 mg/0.3 mL auto-injector Inject 0.3 mLs (0.3 mg total) into the muscle as needed for  Anaphylaxis 1 Pen 2   ergocalciferol , vitamin D2, 1,250 mcg (50,000 unit) capsule Take 1 capsule (50,000 Units total) by mouth once a week 12 capsule 3   levocetirizine (XYZAL ) 5 MG tablet Take 5 mg by mouth once daily     levonorgestreL  (MIRENA  52 MG) IUD Insert 1 each into the uterus once Inserted 09/17/21     methocarbamoL  (ROBAXIN ) 750 MG tablet Take 1 tablet (750 mg total) by mouth 3 (three) times daily as needed (Muscle spasm) 30 tablet 1   omeprazole  (PRILOSEC) 40 MG DR capsule Take 1 capsule (40 mg total) by mouth once daily 90 capsule 3   syringe with needle 1 mL 25 gauge x 1 syringe Use as directed 8 each 0   No current facility-administered medications for this visit.   Allergies  Allergen Reactions   Allegra [Fexofenadine] Shortness Of Breath   Amoxicillin  Anaphylaxis    Sob, itching   Iodinated Contrast Media Rash and Hives    Can be premedicated prior to procedure   Keflex  [Cephalexin ] Hives and Rash   Metrizamide Hives   Morphine Rash    Shortness of breath also    Penicillins Swelling    Throat swelling   Pyridium [Phenazopyridine] Rash   Fluconazole  Rash   Aleve [Naproxen Sodium] Itching   Allergen Extract-Food-Avocado Unknown   Cetirizine Other (See Comments)    Skin eruptures Skin eruptures Skin eruptures   Diclofenac  Sodium  Hives   Doxycycline Unknown   Egg Rash    Other reaction(s): RASH Other reaction(s): RASH   Levaquin  [Levofloxacin ] Unknown   Other Hives    Pain medicine that starts with an L   Percocet [Oxycodone -Acetaminophen ] Itching   Phenazopyridine Hcl Unknown   Phenergan [Promethazine] Hives   Pyrimidine Analogues Rash   Rofecoxib Unknown   Sulfa (Sulfonamide Antibiotics) Unknown   Toradol  [Ketorolac ] Hives   Tramadol  Hives   Hepatitis B Virus Vaccine Rash   Latex Rash   Sulfasalazine Rash   Past Medical History:  Diagnosis Date   Abnormal findings on diagnostic imaging of breast 09/06/2016    Achalasia 01/07/2015   s/p heller myotomy   Allergy    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 Govinda Brtahmanday (Oncology) doesn't think patient has antiphospholipid antibody syndrome; I suspect the aberrant cardiolipin IgM is related to her underlying rheumatologic disease.   Arthritis    as per pt. no arthritis   Asthma without status asthmaticus (HHS-HCC)    B12 deficiency    Chronic venous insufficiency    Clotting disorder (HHS-HCC)    COVID-19 12/2018   DDD (degenerative disc disease), lumbar    Family history of breast cancer    mom, 2 cousins   GERD (gastroesophageal reflux disease) 12/25/2014   Hypoxia 01/13/2019   Iron  deficiency anemia secondary to inadequate dietary iron  intake 02/05/2021   poor tolerance to oral iron    Lymphedema    Lymphoma (CMS/HHS-HCC) 2022   bilateral legs, [facility]- Dr. Delores   Meningitis (HHS-HCC) 2003   Obesity    Personal history of pulmonary embolism    Plantar fasciitis of right foot 12/31/2013   Pneumonia due to COVID-19 virus 01/13/2019   Postoperative malabsorption (HHS-HCC) 07/2015   Pulmonary embolus in pregnancy childbirth or puerperium (HHS-HCC)    Rheumatoid arthritis involving multiple sites with positive rheumatoid factor (CMS/HHS-HCC)    Singers' nodes 08/17/2016   Sleep apnea    does not use c-pap but has it with her   SOB (shortness of breath) 03/25/2019   SOB with pneumonia due COVID pneumonia   Past Surgical History:  Procedure Laterality Date   CHOLECYSTECTOMY  03/1998   REDUCTION MAMMAPLASTY Bilateral 12/2003   BREAST BIOPSY Left 2006   benign/clip   BREAST BIOPSY Right 2015   benign/clip   ESOPHAGEAL MOTILITY STUDY W/INT & REP  12/10/2014   EGD  12/25/2014   Procedure: ESOPHAGOGASTRODUODENOSCOPY, FLEXIBLE, TRANSORAL; DIAGNOSTIC, INCLUDING COLLECTION OF SPECIMEN(S) BY BRUSHING OR WASHING, WHEN PERFORMED (SEPARATE PROCEDURE);  Surgeon: Alm Locust, MD;  Location:  [facility] ENDO/[facility];  Service: Gastroenterology;;   EXPLORATORY LAPAROTOMY  01/2015   ESOPHAGUS SURGERY  01/2015   LAPAROSCOPIC ESOPHAGOMYOTOMY W/FUNDOPLASTY N/A 02/05/2015   Procedure: LAPAROSCOPY, SURGICAL; ESOPHAGOMYOTOMY (HELLER TYPE), WITH FUNDOPLASTY, WHEN PERFORMED;  Surgeon: Lonell Cheryl Fletcher, MD;  Location: [facility] OR;  Service: General Surgery;  Laterality: N/A;   REVISION GASTROJEJUNOSTOMY N/A 02/05/2015   Procedure: LAPAROSCOPIC Gastrectomy withy Roux en y Reconstruction;  Surgeon: Lonell Cheryl Fletcher, MD;  Location: [facility] OR;  Service: General Surgery;  Laterality: N/A;   EGD N/A 11/06/2015   Procedure: EGD;  Surgeon: Lonell Cheryl Fletcher, MD;  Location: [facility] OR;  Service: General Surgery;  Laterality: N/A;   COLONOSCOPY N/A 11/06/2015   Procedure: COLONOSCOPY, FLEXIBLE; DIAGNOSTIC;  Surgeon: Lonell Cheryl Fletcher, MD;  Location: [facility] OR;  Service: General Surgery;  Laterality: N/A;   ESOPHAGOGASTRODUODENOSCOPY (EGD) WITH PROPOFOL   10/04/2016   Dr. Ruel Kung   ESOPHAGOGASTRODUODENOSCOPY (EGD) WITH  PROPOFOL   04/14/2017   Dr. Ruel Kung   SINUS SURGERY Bilateral 10/20/2017   Bilateral pansinusotomy   ESOPHAGOGASTRODUODENOSCOPY (EGD) WITH PROPOFOL   10/31/2018   Dr. Ruel Kung   COLONOSCOPY WITH PROPOFOL   10/31/2018   Dr. Ruel Kung   ESOPHAGOGASTRODUODENOSCOPY (EGD) WITH PROPOFOL   06/28/2019   Dr. Ruel Kung   ESOPHAGOGASTRODUODENOSCOPY (EGD) WITH PROPOFOL   02/10/2021   Dr. Ruel Kung   DILATATION AND CURETTAGE /HYSTEROSCOPY  INTRAUTERINE DEVICE (IUD) REMOVAL  INTRAUTERINE DEVICE (IUD) INSERTION   09/17/2021   Dr. Garnette Mace   ARTHROPLASTY HIP TOTAL Left 09/21/2023   By Dr. Lorelle   BREAST SURGERY     CESAREAN DELIVERY  2000, 2002   LAPAROSCOPIC GASTRIC BYPASS     ORTHOPEDIC SURGERY Right    arthroscopy    Family History  Problem Relation Name Age of Onset   Breast cancer Mother Nurah Petrides    Diabetes Mother Lynnmarie Lovett    High blood pressure  (Hypertension) Mother Josue Kass    Gout Mother Unita Detamore    Rheum arthritis Mother Aleasha Fregeau    Cancer Mother Marili Vader        breast   Gout Father Ellionna Buckbee    Asthma Father Jylian Pappalardo    High blood pressure (Hypertension) Father Oma Marzan    Cancer Father Mattie Nordell        deceased   Kidney cancer Father Zayley Arras    Kidney disease Father Katalia Choma    Diabetes Father Geraldy Akridge        deceased   Heart disease Father Jackilyn Umphlett        deceased   Rheum arthritis Father Takeria Marquina    Osteoporosis (Thinning of bones) Father Arvetta Araque    Asthma Sister Roxane Puerto    Kidney disease Paternal Grandmother     Stroke Paternal Grandfather     Breast cancer Cousin     Breast cancer Cousin     Asthma Child      Social History   Socioeconomic History   Marital status: Single   Number of children: 1  Occupational History   Occupation: SUBSTITUTE TEACHER  Tobacco Use   Smoking status: Never   Smokeless tobacco: Never  Vaping Use   Vaping status: Never Used  Substance and Sexual Activity   Alcohol use: Never    Comment: socially   Drug use: No   Sexual activity: Yes    Partners: Male    Birth control/protection: I.U.D.    Comment: mirena  09/17/21   Social Drivers of Health   Financial Resource Strain: Medium Risk (10/02/2023)   Overall Financial Resource Strain (CARDIA)    Difficulty of Paying Living Expenses: Somewhat hard  Food Insecurity: No Food Insecurity (12/21/2023)   Received from [facility]   Hunger Vital Sign    Within the past 12 months, you worried that your food would run out before you got the money to buy more.: Never true    Within the past 12 months, the food you bought just didn't last and you didn't have money to get more.: Never true  Recent Concern: Food Insecurity - Food Insecurity Present (10/02/2023)   Hunger Vital Sign    Worried About Running Out of Food in the Last Year:  Sometimes true    Ran Out of Food in the Last Year: Sometimes true  Transportation Needs: No Transportation Needs (12/21/2023)   Received from [facility] - Transportation  In the past 12 months, has lack of transportation kept you from medical appointments or from getting medications?: No    In the past 12 months, has lack of transportation kept you from meetings, work, or from getting things needed for daily living?: No    Review of Systems:  A comprehensive 14 point ROS was performed, reviewed, and the pertinent orthopaedic findings are documented in the HPI.  Exam: Vitals:   12/27/23 1458  BP: 116/70  Weight: 93.9 kg (207 lb)  Height: 160 cm (5' 3)  PainSc: 10-Worst pain ever  PainLoc: Hip   General/Constitutional: The patient appears to be well-nourished, well-developed, and in no acute distress. Neuro/Psych: Normal mood and affect, oriented to person, place and time.  Left lower Extremity Exam  The left hip is noted to have a well-healing surgical incision on the anterior thigh with no evidence of erythema or drainage.  No evidence for infection at this time.   No focal swelling or fluctuance noted around the hip.   Extremely tender to palpation over the left anterior thigh musculature. No significant increase in pain with straight leg raise The patient is ambulating with rolling walker.   Range of motion of the hip is smooth and pain-free to passive motion.   Neurovascularly intact distally, able to dorsiflex with good sensation over the foot.   Negative Homans' sign no calf tenderness to palpation.      Assessment: Encounter Diagnosis  Name Primary?   Left leg swelling Yes  Possible lumbar to radiculopathy,  Plan: I reviewed the clinical intragraft findings with the patient.  I reviewed her old lumbar MRI which showed a bulging disc encroaching on the left foramina of L3 from last summer.  Given the left thigh pain I am concerned that this could be  a progression of disc herniation at that level.  I am recommending her for an MRI of the lumbar spine and a MRI of the left thigh to rule out any potential fluid collection which was missed on CT scan.  Patient agrees this plan lumbar follow-up as soon as possible after the MRI and try to obtain the scans on an emergent basis given her severity of her symptoms.  All questions answered.

## 2023-12-27 NOTE — H&P (View-Only) (Signed)
 HPI:  Debbie Sosa is a 48 y.o. female who presents for follow-up after being seen at the hospital this past week.  Patient is 3 months and a week status post left anterior total replacement.  She was doing fantastic with her hip until she developed acute swelling and pain in her severe burning sensation and some muscle spasm in her left thigh.  An initial CT scan in the emergency room showed concern for a possible large hematoma repeat CTA showed no evidence of any fluid collections or hematoma.  Given the elevated inflammatory markers and severe pain in the leg the patient was taken to the operating room for an exam under anesthesia and ultrasound showed no fluid collections at that time in the thigh and a attempted fluoroscopic aspiration of the hip yielded a dry tap with no significant fluid to be found.  Patient was started on steroids and reported initial improvement in pain and symptoms.  She was discharged from the hospital with home physical therapy on muscle relaxers and a prednisone  taper.  As she has tapered off the prednisone  her pain is gone back up to a 10 out of 10 described as a spasm and throbbing pain in her left thigh.  She reports her hip joint itself and her hip incision have had no pain or any issues.  She denies any fevers chills or any other signs or symptoms of systemic infection.  Current Outpatient Medications  Medication Sig Dispense Refill   oxyCODONE  (ROXICODONE ) 5 MG immediate release tablet Take 5 mg by mouth     albuterol  MDI, PROVENTIL , VENTOLIN , PROAIR , HFA 90 mcg/actuation inhaler Inhale 2 inhalations into the lungs every 4 (four) hours as needed for Wheezing 1 each 3   cyanocobalamin  (VITAMIN B12) 1,000 mcg/mL injection Inject 1 mL (1,000 mcg total) into the muscle once a week for 28 days, THEN 1 mL (1,000 mcg total) monthly for 120 days. 8 mL 0   EPINEPHrine  (EPIPEN ) 0.3 mg/0.3 mL auto-injector Inject 0.3 mLs (0.3 mg total) into the muscle as needed for  Anaphylaxis 1 Pen 2   ergocalciferol , vitamin D2, 1,250 mcg (50,000 unit) capsule Take 1 capsule (50,000 Units total) by mouth once a week 12 capsule 3   levocetirizine (XYZAL ) 5 MG tablet Take 5 mg by mouth once daily     levonorgestreL  (MIRENA  52 MG) IUD Insert 1 each into the uterus once Inserted 09/17/21     methocarbamoL  (ROBAXIN ) 750 MG tablet Take 1 tablet (750 mg total) by mouth 3 (three) times daily as needed (Muscle spasm) 30 tablet 1   omeprazole  (PRILOSEC) 40 MG DR capsule Take 1 capsule (40 mg total) by mouth once daily 90 capsule 3   syringe with needle 1 mL 25 gauge x 1 syringe Use as directed 8 each 0   No current facility-administered medications for this visit.   Allergies  Allergen Reactions   Allegra [Fexofenadine] Shortness Of Breath   Amoxicillin  Anaphylaxis    Sob, itching   Iodinated Contrast Media Rash and Hives    Can be premedicated prior to procedure   Keflex  [Cephalexin ] Hives and Rash   Metrizamide Hives   Morphine Rash    Shortness of breath also    Penicillins Swelling    Throat swelling   Pyridium [Phenazopyridine] Rash   Fluconazole  Rash   Aleve [Naproxen Sodium] Itching   Allergen Extract-Food-Avocado Unknown   Cetirizine Other (See Comments)    Skin eruptures Skin eruptures Skin eruptures   Diclofenac  Sodium  Hives   Doxycycline Unknown   Egg Rash    Other reaction(s): RASH Other reaction(s): RASH   Levaquin  [Levofloxacin ] Unknown   Other Hives    Pain medicine that starts with an L   Percocet [Oxycodone -Acetaminophen ] Itching   Phenazopyridine Hcl Unknown   Phenergan [Promethazine] Hives   Pyrimidine Analogues Rash   Rofecoxib Unknown   Sulfa (Sulfonamide Antibiotics) Unknown   Toradol  [Ketorolac ] Hives   Tramadol  Hives   Hepatitis B Virus Vaccine Rash   Latex Rash   Sulfasalazine Rash   Past Medical History:  Diagnosis Date   Abnormal findings on diagnostic imaging of breast 09/06/2016    Achalasia 01/07/2015   s/p heller myotomy   Allergy    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 Govinda Brtahmanday (Oncology) doesn't think patient has antiphospholipid antibody syndrome; I suspect the aberrant cardiolipin IgM is related to her underlying rheumatologic disease.   Arthritis    as per pt. no arthritis   Asthma without status asthmaticus (HHS-HCC)    B12 deficiency    Chronic venous insufficiency    Clotting disorder (HHS-HCC)    COVID-19 12/2018   DDD (degenerative disc disease), lumbar    Family history of breast cancer    mom, 2 cousins   GERD (gastroesophageal reflux disease) 12/25/2014   Hypoxia 01/13/2019   Iron  deficiency anemia secondary to inadequate dietary iron  intake 02/05/2021   poor tolerance to oral iron    Lymphedema    Lymphoma (CMS/HHS-HCC) 2022   bilateral legs, [facility]- Dr. Delores   Meningitis (HHS-HCC) 2003   Obesity    Personal history of pulmonary embolism    Plantar fasciitis of right foot 12/31/2013   Pneumonia due to COVID-19 virus 01/13/2019   Postoperative malabsorption (HHS-HCC) 07/2015   Pulmonary embolus in pregnancy childbirth or puerperium (HHS-HCC)    Rheumatoid arthritis involving multiple sites with positive rheumatoid factor (CMS/HHS-HCC)    Singers' nodes 08/17/2016   Sleep apnea    does not use c-pap but has it with her   SOB (shortness of breath) 03/25/2019   SOB with pneumonia due COVID pneumonia   Past Surgical History:  Procedure Laterality Date   CHOLECYSTECTOMY  03/1998   REDUCTION MAMMAPLASTY Bilateral 12/2003   BREAST BIOPSY Left 2006   benign/clip   BREAST BIOPSY Right 2015   benign/clip   ESOPHAGEAL MOTILITY STUDY W/INT & REP  12/10/2014   EGD  12/25/2014   Procedure: ESOPHAGOGASTRODUODENOSCOPY, FLEXIBLE, TRANSORAL; DIAGNOSTIC, INCLUDING COLLECTION OF SPECIMEN(S) BY BRUSHING OR WASHING, WHEN PERFORMED (SEPARATE PROCEDURE);  Surgeon: Alm Locust, MD;  Location:  [facility] ENDO/[facility];  Service: Gastroenterology;;   EXPLORATORY LAPAROTOMY  01/2015   ESOPHAGUS SURGERY  01/2015   LAPAROSCOPIC ESOPHAGOMYOTOMY W/FUNDOPLASTY N/A 02/05/2015   Procedure: LAPAROSCOPY, SURGICAL; ESOPHAGOMYOTOMY (HELLER TYPE), WITH FUNDOPLASTY, WHEN PERFORMED;  Surgeon: Lonell Cheryl Fletcher, MD;  Location: [facility] OR;  Service: General Surgery;  Laterality: N/A;   REVISION GASTROJEJUNOSTOMY N/A 02/05/2015   Procedure: LAPAROSCOPIC Gastrectomy withy Roux en y Reconstruction;  Surgeon: Lonell Cheryl Fletcher, MD;  Location: [facility] OR;  Service: General Surgery;  Laterality: N/A;   EGD N/A 11/06/2015   Procedure: EGD;  Surgeon: Lonell Cheryl Fletcher, MD;  Location: [facility] OR;  Service: General Surgery;  Laterality: N/A;   COLONOSCOPY N/A 11/06/2015   Procedure: COLONOSCOPY, FLEXIBLE; DIAGNOSTIC;  Surgeon: Lonell Cheryl Fletcher, MD;  Location: [facility] OR;  Service: General Surgery;  Laterality: N/A;   ESOPHAGOGASTRODUODENOSCOPY (EGD) WITH PROPOFOL   10/04/2016   Dr. Ruel Kung   ESOPHAGOGASTRODUODENOSCOPY (EGD) WITH  PROPOFOL   04/14/2017   Dr. Ruel Kung   SINUS SURGERY Bilateral 10/20/2017   Bilateral pansinusotomy   ESOPHAGOGASTRODUODENOSCOPY (EGD) WITH PROPOFOL   10/31/2018   Dr. Ruel Kung   COLONOSCOPY WITH PROPOFOL   10/31/2018   Dr. Ruel Kung   ESOPHAGOGASTRODUODENOSCOPY (EGD) WITH PROPOFOL   06/28/2019   Dr. Ruel Kung   ESOPHAGOGASTRODUODENOSCOPY (EGD) WITH PROPOFOL   02/10/2021   Dr. Ruel Kung   DILATATION AND CURETTAGE /HYSTEROSCOPY  INTRAUTERINE DEVICE (IUD) REMOVAL  INTRAUTERINE DEVICE (IUD) INSERTION   09/17/2021   Dr. Garnette Mace   ARTHROPLASTY HIP TOTAL Left 09/21/2023   By Dr. Lorelle   BREAST SURGERY     CESAREAN DELIVERY  2000, 2002   LAPAROSCOPIC GASTRIC BYPASS     ORTHOPEDIC SURGERY Right    arthroscopy    Family History  Problem Relation Name Age of Onset   Breast cancer Mother Nurah Petrides    Diabetes Mother Lynnmarie Lovett    High blood pressure  (Hypertension) Mother Josue Kass    Gout Mother Unita Detamore    Rheum arthritis Mother Aleasha Fregeau    Cancer Mother Marili Vader        breast   Gout Father Ellionna Buckbee    Asthma Father Jylian Pappalardo    High blood pressure (Hypertension) Father Oma Marzan    Cancer Father Mattie Nordell        deceased   Kidney cancer Father Zayley Arras    Kidney disease Father Katalia Choma    Diabetes Father Geraldy Akridge        deceased   Heart disease Father Jackilyn Umphlett        deceased   Rheum arthritis Father Takeria Marquina    Osteoporosis (Thinning of bones) Father Arvetta Araque    Asthma Sister Roxane Puerto    Kidney disease Paternal Grandmother     Stroke Paternal Grandfather     Breast cancer Cousin     Breast cancer Cousin     Asthma Child      Social History   Socioeconomic History   Marital status: Single   Number of children: 1  Occupational History   Occupation: SUBSTITUTE TEACHER  Tobacco Use   Smoking status: Never   Smokeless tobacco: Never  Vaping Use   Vaping status: Never Used  Substance and Sexual Activity   Alcohol use: Never    Comment: socially   Drug use: No   Sexual activity: Yes    Partners: Male    Birth control/protection: I.U.D.    Comment: mirena  09/17/21   Social Drivers of Health   Financial Resource Strain: Medium Risk (10/02/2023)   Overall Financial Resource Strain (CARDIA)    Difficulty of Paying Living Expenses: Somewhat hard  Food Insecurity: No Food Insecurity (12/21/2023)   Received from [facility]   Hunger Vital Sign    Within the past 12 months, you worried that your food would run out before you got the money to buy more.: Never true    Within the past 12 months, the food you bought just didn't last and you didn't have money to get more.: Never true  Recent Concern: Food Insecurity - Food Insecurity Present (10/02/2023)   Hunger Vital Sign    Worried About Running Out of Food in the Last Year:  Sometimes true    Ran Out of Food in the Last Year: Sometimes true  Transportation Needs: No Transportation Needs (12/21/2023)   Received from [facility] - Transportation  In the past 12 months, has lack of transportation kept you from medical appointments or from getting medications?: No    In the past 12 months, has lack of transportation kept you from meetings, work, or from getting things needed for daily living?: No    Review of Systems:  A comprehensive 14 point ROS was performed, reviewed, and the pertinent orthopaedic findings are documented in the HPI.  Exam: Vitals:   12/27/23 1458  BP: 116/70  Weight: 93.9 kg (207 lb)  Height: 160 cm (5' 3)  PainSc: 10-Worst pain ever  PainLoc: Hip   General/Constitutional: The patient appears to be well-nourished, well-developed, and in no acute distress. Neuro/Psych: Normal mood and affect, oriented to person, place and time.  Left lower Extremity Exam  The left hip is noted to have a well-healing surgical incision on the anterior thigh with no evidence of erythema or drainage.  No evidence for infection at this time.   No focal swelling or fluctuance noted around the hip.   Extremely tender to palpation over the left anterior thigh musculature. No significant increase in pain with straight leg raise The patient is ambulating with rolling walker.   Range of motion of the hip is smooth and pain-free to passive motion.   Neurovascularly intact distally, able to dorsiflex with good sensation over the foot.   Negative Homans' sign no calf tenderness to palpation.      Assessment: Encounter Diagnosis  Name Primary?   Left leg swelling Yes  Possible lumbar to radiculopathy,  Plan: I reviewed the clinical intragraft findings with the patient.  I reviewed her old lumbar MRI which showed a bulging disc encroaching on the left foramina of L3 from last summer.  Given the left thigh pain I am concerned that this could be  a progression of disc herniation at that level.  I am recommending her for an MRI of the lumbar spine and a MRI of the left thigh to rule out any potential fluid collection which was missed on CT scan.  Patient agrees this plan lumbar follow-up as soon as possible after the MRI and try to obtain the scans on an emergent basis given her severity of her symptoms.  All questions answered.

## 2023-12-28 ENCOUNTER — Other Ambulatory Visit: Payer: Self-pay | Admitting: Orthopedic Surgery

## 2023-12-29 ENCOUNTER — Observation Stay
Admission: RE | Admit: 2023-12-29 | Discharge: 2024-01-02 | Disposition: A | Attending: Orthopedic Surgery | Admitting: Orthopedic Surgery

## 2023-12-29 ENCOUNTER — Encounter: Payer: Self-pay | Admitting: Orthopedic Surgery

## 2023-12-29 ENCOUNTER — Other Ambulatory Visit: Payer: Self-pay

## 2023-12-29 ENCOUNTER — Encounter: Admission: RE | Disposition: A | Payer: Self-pay | Source: Home / Self Care | Attending: Orthopedic Surgery

## 2023-12-29 ENCOUNTER — Ambulatory Visit: Admitting: Anesthesiology

## 2023-12-29 DIAGNOSIS — Z96642 Presence of left artificial hip joint: Secondary | ICD-10-CM | POA: Insufficient documentation

## 2023-12-29 DIAGNOSIS — X58XXXA Exposure to other specified factors, initial encounter: Secondary | ICD-10-CM | POA: Insufficient documentation

## 2023-12-29 DIAGNOSIS — S7012XA Contusion of left thigh, initial encounter: Secondary | ICD-10-CM | POA: Diagnosis present

## 2023-12-29 DIAGNOSIS — S7002XA Contusion of left hip, initial encounter: Secondary | ICD-10-CM | POA: Insufficient documentation

## 2023-12-29 DIAGNOSIS — Z91040 Latex allergy status: Secondary | ICD-10-CM | POA: Diagnosis not present

## 2023-12-29 DIAGNOSIS — Z8616 Personal history of COVID-19: Secondary | ICD-10-CM | POA: Diagnosis not present

## 2023-12-29 DIAGNOSIS — J45909 Unspecified asthma, uncomplicated: Secondary | ICD-10-CM | POA: Diagnosis not present

## 2023-12-29 DIAGNOSIS — S7012XD Contusion of left thigh, subsequent encounter: Principal | ICD-10-CM

## 2023-12-29 HISTORY — PX: INCISION AND DRAINAGE OF HEMATOMA, THIGH: SHX7378

## 2023-12-29 LAB — TYPE AND SCREEN
ABO/RH(D): A POS
Antibody Screen: NEGATIVE

## 2023-12-29 SURGERY — INCISION AND DRAINAGE OF HEMATOMA, THIGH
Anesthesia: General | Site: Thigh | Laterality: Left

## 2023-12-29 MED ORDER — ONDANSETRON HCL 4 MG/2ML IJ SOLN
4.0000 mg | Freq: Four times a day (QID) | INTRAMUSCULAR | Status: DC | PRN
  Administered 2023-12-31: 4 mg via INTRAVENOUS
  Filled 2023-12-29: qty 2

## 2023-12-29 MED ORDER — EPHEDRINE 5 MG/ML INJ
INTRAVENOUS | Status: AC
  Filled 2023-12-29: qty 5

## 2023-12-29 MED ORDER — DOCUSATE SODIUM 100 MG PO CAPS
100.0000 mg | ORAL_CAPSULE | Freq: Two times a day (BID) | ORAL | Status: DC
  Administered 2023-12-29 – 2024-01-02 (×8): 100 mg via ORAL
  Filled 2023-12-29 (×8): qty 1

## 2023-12-29 MED ORDER — ALBUTEROL SULFATE HFA 108 (90 BASE) MCG/ACT IN AERS
INHALATION_SPRAY | RESPIRATORY_TRACT | Status: DC | PRN
  Administered 2023-12-29: 3 via RESPIRATORY_TRACT

## 2023-12-29 MED ORDER — MIDAZOLAM HCL 2 MG/2ML IJ SOLN
INTRAMUSCULAR | Status: AC
  Filled 2023-12-29: qty 2

## 2023-12-29 MED ORDER — ENOXAPARIN SODIUM 60 MG/0.6ML IJ SOSY
0.5000 mg/kg | PREFILLED_SYRINGE | INTRAMUSCULAR | Status: DC
  Administered 2023-12-30 – 2024-01-02 (×4): 45 mg via SUBCUTANEOUS
  Filled 2023-12-29 (×4): qty 0.6

## 2023-12-29 MED ORDER — SODIUM CHLORIDE 0.9 % IV SOLN: INTRAVENOUS | Status: DC

## 2023-12-29 MED ORDER — ORAL CARE MOUTH RINSE: 15.0000 mL | Freq: Once | OROMUCOSAL | Status: DC

## 2023-12-29 MED ORDER — FENTANYL CITRATE (PF) 100 MCG/2ML IJ SOLN
25.0000 ug | INTRAMUSCULAR | Status: DC | PRN
  Administered 2023-12-29 (×3): 50 ug via INTRAVENOUS

## 2023-12-29 MED ORDER — PROPOFOL 10 MG/ML IV BOLUS
INTRAVENOUS | Status: AC
  Filled 2023-12-29: qty 20

## 2023-12-29 MED ORDER — OXYCODONE HCL 5 MG PO TABS: 2.5000 mg | ORAL_TABLET | ORAL | Status: DC | PRN

## 2023-12-29 MED ORDER — PHENOL 1.4 % MT LIQD: 1.0000 | OROMUCOSAL | Status: DC | PRN

## 2023-12-29 MED ORDER — FENTANYL CITRATE (PF) 100 MCG/2ML IJ SOLN
INTRAMUSCULAR | Status: AC
  Filled 2023-12-29: qty 2

## 2023-12-29 MED ORDER — VASOPRESSIN 20 UNIT/ML IV SOLN
INTRAVENOUS | Status: DC | PRN
  Administered 2023-12-29: 2 [IU] via INTRAVENOUS
  Administered 2023-12-29 (×3): 1 [IU] via INTRAVENOUS
  Administered 2023-12-29: 2 [IU] via INTRAVENOUS
  Administered 2023-12-29: 1 [IU] via INTRAVENOUS

## 2023-12-29 MED ORDER — VASOPRESSIN 20 UNIT/ML IV SOLN
INTRAVENOUS | Status: AC
  Filled 2023-12-29: qty 1

## 2023-12-29 MED ORDER — MIDAZOLAM HCL (PF) 2 MG/2ML IJ SOLN
INTRAMUSCULAR | Status: DC | PRN
  Administered 2023-12-29: 2 mg via INTRAVENOUS

## 2023-12-29 MED ORDER — LACTATED RINGERS IV SOLN: INTRAVENOUS | Status: DC

## 2023-12-29 MED ORDER — OXYCODONE HCL 5 MG PO TABS
ORAL_TABLET | ORAL | Status: AC
  Filled 2023-12-29: qty 1

## 2023-12-29 MED ORDER — PANTOPRAZOLE SODIUM 40 MG PO TBEC
40.0000 mg | DELAYED_RELEASE_TABLET | Freq: Every day | ORAL | Status: DC
  Administered 2023-12-29 – 2024-01-02 (×5): 40 mg via ORAL
  Filled 2023-12-29 (×5): qty 1

## 2023-12-29 MED ORDER — ACETAMINOPHEN 10 MG/ML IV SOLN: 1000.0000 mg | Freq: Once | INTRAVENOUS | Status: DC | PRN

## 2023-12-29 MED ORDER — VANCOMYCIN HCL IN DEXTROSE 1-5 GM/200ML-% IV SOLN
1000.0000 mg | Freq: Two times a day (BID) | INTRAVENOUS | Status: AC
  Administered 2023-12-29: 1000 mg via INTRAVENOUS
  Filled 2023-12-29: qty 200

## 2023-12-29 MED ORDER — DIPHENHYDRAMINE HCL 12.5 MG/5ML PO ELIX
12.5000 mg | ORAL_SOLUTION | ORAL | Status: DC | PRN
  Administered 2023-12-30: 12.5 mg via ORAL
  Filled 2023-12-29 (×2): qty 5

## 2023-12-29 MED ORDER — OXYCODONE HCL 5 MG PO TABS
5.0000 mg | ORAL_TABLET | Freq: Once | ORAL | Status: AC
  Administered 2023-12-29: 5 mg via ORAL

## 2023-12-29 MED ORDER — METOCLOPRAMIDE HCL 5 MG/ML IJ SOLN: 5.0000 mg | Freq: Three times a day (TID) | INTRAMUSCULAR | Status: DC | PRN

## 2023-12-29 MED ORDER — SURGIPHOR WOUND IRRIGATION SYSTEM - OPTIME: TOPICAL | Status: DC | PRN

## 2023-12-29 MED ORDER — ACETAMINOPHEN 500 MG PO TABS
1000.0000 mg | ORAL_TABLET | Freq: Three times a day (TID) | ORAL | Status: AC
  Administered 2023-12-29 – 2023-12-30 (×4): 1000 mg via ORAL
  Filled 2023-12-29 (×4): qty 2

## 2023-12-29 MED ORDER — GLYCOPYRROLATE 0.2 MG/ML IJ SOLN
INTRAMUSCULAR | Status: AC
  Filled 2023-12-29: qty 1

## 2023-12-29 MED ORDER — CHLORHEXIDINE GLUCONATE 0.12 % MT SOLN: 15.0000 mL | Freq: Once | OROMUCOSAL | Status: DC

## 2023-12-29 MED ORDER — VANCOMYCIN HCL 1000 MG IV SOLR
INTRAVENOUS | Status: DC | PRN
  Administered 2023-12-29: 1000 mg via INTRAVENOUS

## 2023-12-29 MED ORDER — VANCOMYCIN HCL IN DEXTROSE 1-5 GM/200ML-% IV SOLN
INTRAVENOUS | Status: AC
  Filled 2023-12-29: qty 200

## 2023-12-29 MED ORDER — MENTHOL 3 MG MT LOZG: 1.0000 | LOZENGE | OROMUCOSAL | Status: DC | PRN

## 2023-12-29 MED ORDER — ONDANSETRON HCL 4 MG PO TABS: 4.0000 mg | ORAL_TABLET | Freq: Four times a day (QID) | ORAL | Status: DC | PRN

## 2023-12-29 MED ORDER — OXYCODONE HCL 5 MG PO TABS
5.0000 mg | ORAL_TABLET | ORAL | Status: DC | PRN
  Administered 2023-12-29 – 2024-01-02 (×18): 5 mg via ORAL
  Filled 2023-12-29 (×18): qty 1

## 2023-12-29 MED ORDER — SODIUM CHLORIDE 0.9 % IR SOLN
Status: DC | PRN
  Administered 2023-12-29: 500 mL

## 2023-12-29 MED ORDER — METHOCARBAMOL 500 MG PO TABS
500.0000 mg | ORAL_TABLET | Freq: Four times a day (QID) | ORAL | Status: DC | PRN
  Administered 2023-12-29 – 2024-01-02 (×13): 500 mg via ORAL
  Filled 2023-12-29 (×13): qty 1

## 2023-12-29 MED ORDER — PHENYLEPHRINE 80 MCG/ML (10ML) SYRINGE FOR IV PUSH (FOR BLOOD PRESSURE SUPPORT)
PREFILLED_SYRINGE | INTRAVENOUS | Status: AC
  Filled 2023-12-29: qty 10

## 2023-12-29 MED ORDER — PHENYLEPHRINE 80 MCG/ML (10ML) SYRINGE FOR IV PUSH (FOR BLOOD PRESSURE SUPPORT)
PREFILLED_SYRINGE | INTRAVENOUS | Status: DC | PRN
  Administered 2023-12-29 (×2): 80 ug via INTRAVENOUS
  Administered 2023-12-29: 160 ug via INTRAVENOUS
  Administered 2023-12-29: 80 ug via INTRAVENOUS
  Administered 2023-12-29: 160 ug via INTRAVENOUS

## 2023-12-29 MED ORDER — PROPOFOL 10 MG/ML IV BOLUS
INTRAVENOUS | Status: DC | PRN
  Administered 2023-12-29: 100 mg via INTRAVENOUS
  Administered 2023-12-29: 50 mg via INTRAVENOUS
  Administered 2023-12-29: 125 ug/kg/min via INTRAVENOUS
  Administered 2023-12-29: 50 mg via INTRAVENOUS

## 2023-12-29 MED ORDER — VANCOMYCIN HCL IN DEXTROSE 1-5 GM/200ML-% IV SOLN
1000.0000 mg | INTRAVENOUS | Status: AC
  Administered 2023-12-29: 1000 mg via INTRAVENOUS

## 2023-12-29 MED ORDER — ONDANSETRON HCL 4 MG/2ML IJ SOLN: 4.0000 mg | Freq: Once | INTRAMUSCULAR | Status: DC | PRN

## 2023-12-29 MED ORDER — LIDOCAINE HCL (CARDIAC) PF 100 MG/5ML IV SOSY
PREFILLED_SYRINGE | INTRAVENOUS | Status: DC | PRN
  Administered 2023-12-29: 100 mg via INTRAVENOUS

## 2023-12-29 MED ORDER — GENTAMICIN SULFATE 40 MG/ML IJ SOLN
5.0000 mg/kg | INTRAVENOUS | Status: AC
  Administered 2023-12-29: 330 mg via INTRAVENOUS
  Filled 2023-12-29: qty 8.25

## 2023-12-29 MED ORDER — ACETAMINOPHEN 10 MG/ML IV SOLN
INTRAVENOUS | Status: DC | PRN
  Administered 2023-12-29: 1000 mg via INTRAVENOUS

## 2023-12-29 MED ORDER — DEXAMETHASONE SOD PHOSPHATE PF 10 MG/ML IJ SOLN
INTRAMUSCULAR | Status: DC | PRN
  Administered 2023-12-29: 10 mg via INTRAVENOUS

## 2023-12-29 MED ORDER — PROPOFOL 1000 MG/100ML IV EMUL
INTRAVENOUS | Status: AC
  Filled 2023-12-29: qty 100

## 2023-12-29 MED ORDER — METOCLOPRAMIDE HCL 5 MG PO TABS: 5.0000 mg | ORAL_TABLET | Freq: Three times a day (TID) | ORAL | Status: DC | PRN

## 2023-12-29 MED ORDER — FENTANYL CITRATE (PF) 100 MCG/2ML IJ SOLN
INTRAMUSCULAR | Status: DC | PRN
  Administered 2023-12-29 (×2): 50 ug via INTRAVENOUS

## 2023-12-29 MED ADMIN — Ephedrine Sulf-NaCl Soln Pref Syr 50 MG/10ML-0.9% (5 MG/ML): 10 mg | INTRAVENOUS | NDC 51754425001

## 2023-12-29 MED ADMIN — Sodium Chloride Irrigation Soln 0.9%: 3000 mL | NDC 99999050048

## 2023-12-29 MED ADMIN — Glycopyrrolate Inj 0.2 MG/ML: .2 mg | INTRAVENOUS | NDC 00143968201

## 2023-12-29 MED FILL — Acetaminophen IV Soln 10 MG/ML: INTRAVENOUS | Qty: 100 | Status: AC

## 2023-12-29 MED FILL — Lidocaine HCl Local Preservative Free (PF) Inj 2%: INTRAMUSCULAR | Qty: 5 | Status: AC

## 2023-12-29 SURGICAL SUPPLY — 70 items
BLADE CLIPPER SURG (BLADE) IMPLANT
BLADE SAGITTAL AGGR TOOTH XLG (BLADE) IMPLANT
BNDG COHESIVE 6X5 TAN ST LF (GAUZE/BANDAGES/DRESSINGS) ×2 IMPLANT
BRUSH SCRUB EZ PLAIN DRY (MISCELLANEOUS) ×1 IMPLANT
CANISTER WOUND CARE 500ML ATS (WOUND CARE) IMPLANT
CHLORAPREP W/TINT 26 (MISCELLANEOUS) ×1 IMPLANT
CNTNR URN SCR LID CUP LEK RST (MISCELLANEOUS) IMPLANT
DERMABOND ADVANCED .7 DNX12 (GAUZE/BANDAGES/DRESSINGS) IMPLANT
DRAPE C-ARM XRAY 36X54 (DRAPES) ×1 IMPLANT
DRAPE SHEET LG 3/4 BI-LAMINATE (DRAPES) ×2 IMPLANT
DRAPE TABLE BACK 80X90 (DRAPES) ×1 IMPLANT
DRSG MEPILEX SACRM 8.7X9.8 (GAUZE/BANDAGES/DRESSINGS) ×1 IMPLANT
DRSG OPSITE POSTOP 4X8 (GAUZE/BANDAGES/DRESSINGS) IMPLANT
ELECTRODE BLDE 4.0 EZ CLN MEGD (MISCELLANEOUS) ×1 IMPLANT
ELECTRODE REM PT RTRN 9FT ADLT (ELECTROSURGICAL) ×1 IMPLANT
EVACUATOR 1/8 PVC DRAIN (DRAIN) IMPLANT
GLOVE BIO SURGEON STRL SZ8 (GLOVE) ×1 IMPLANT
GLOVE BIOGEL PI IND STRL 8 (GLOVE) ×1 IMPLANT
GLOVE PI ORTHO PRO STRL 7.5 (GLOVE) ×2 IMPLANT
GLOVE PI ORTHO PRO STRL SZ8 (GLOVE) ×2 IMPLANT
GLOVE SURG SYN 7.5 PF PI (GLOVE) ×1 IMPLANT
GOWN SRG XL LONG LVL 3 NONREIN (GOWNS) ×1 IMPLANT
GOWN SRG XL LVL 3 NONREINFORCE (GOWNS) ×1 IMPLANT
GOWN STRL REUS W/ TWL LRG LVL3 (GOWN DISPOSABLE) ×1 IMPLANT
HANDLE YANKAUER SUCT OPEN TIP (MISCELLANEOUS) IMPLANT
HANDPIECE VERSAJET DEBRIDEMENT (MISCELLANEOUS) IMPLANT
HOOD PEEL AWAY T7 (MISCELLANEOUS) ×2 IMPLANT
IV 0.9% NACL 1000 ML (IV SOLUTION) IMPLANT
IV SODIUM CHLORIDE 100 ML PAB (IV SOLUTION) ×1 IMPLANT
KIT PATIENT CARE HANA TABLE (KITS) ×1 IMPLANT
KIT TURNOVER CYSTO (KITS) ×1 IMPLANT
LAVAGE JET IRRISEPT WOUND (IRRIGATION / IRRIGATOR) IMPLANT
LIGHT WAVEGUIDE WIDE FLAT (MISCELLANEOUS) ×1 IMPLANT
MANIFOLD NEPTUNE II (INSTRUMENTS) ×1 IMPLANT
MARKER SKIN DUAL TIP RULER LAB (MISCELLANEOUS) ×1 IMPLANT
MAT ABSORB FLUID 56X50 GRAY (MISCELLANEOUS) ×1 IMPLANT
NDL SPNL 20GX3.5 QUINCKE YW (NEEDLE) IMPLANT
NEEDLE SPNL 20GX3.5 QUINCKE YW (NEEDLE) IMPLANT
NS IRRIG 500ML POUR BTL (IV SOLUTION) ×1 IMPLANT
PACK HIP COMPR (MISCELLANEOUS) ×1 IMPLANT
PAD ARMBOARD POSITIONER FOAM (MISCELLANEOUS) ×1 IMPLANT
PENCIL SMOKE EVACUATOR (MISCELLANEOUS) ×1 IMPLANT
PREVENA INCISION MGT 90 150 (MISCELLANEOUS) IMPLANT
SLEEVE SCD COMPRESS KNEE MED (STOCKING) ×1 IMPLANT
SOL .9 NS 3000ML IRR UROMATIC (IV SOLUTION) IMPLANT
SOLN STERILE WATER BTL 1000 ML (IV SOLUTION) ×1 IMPLANT
SOLUTION IRRIG SURGIPHOR (IV SOLUTION) ×1 IMPLANT
SPONGE T-LAP 18X18 ~~LOC~~+RFID (SPONGE) IMPLANT
STAPLER SKIN PROX 35W (STAPLE) IMPLANT
SURGIFLO W/THROMBIN 8M KIT (HEMOSTASIS) IMPLANT
SUT BONE WAX W31G (SUTURE) IMPLANT
SUT ETHIBOND 2 V 37 (SUTURE) IMPLANT
SUT MNCRL AB 3-0 PS2 27 (SUTURE) IMPLANT
SUT PDS AB 0 CT1 27 (SUTURE) IMPLANT
SUT PDS AB 1 CT1 27 (SUTURE) IMPLANT
SUT SILK 0 30XBRD TIE 6 (SUTURE) IMPLANT
SUT STRATAFIX 14 PDO 36 VLT (SUTURE) IMPLANT
SUT VIC AB 0 CT1 36 (SUTURE) IMPLANT
SUT VIC AB 2-0 CT2 27 (SUTURE) IMPLANT
SUTURE STRATA SPIR 4-0 18 (SUTURE) IMPLANT
SWAB CULTURE AMIES ANAERIB BLU (MISCELLANEOUS) IMPLANT
SYR 20ML LL LF (SYRINGE) IMPLANT
TAPE MICROFOAM 4IN (TAPE) IMPLANT
TIP FAN IRRIG PULSAVAC PLUS (DISPOSABLE) IMPLANT
TIP IRRIG/SUCT HIGH CAPACITY (MISCELLANEOUS) IMPLANT
TOWEL OR 17X26 4PK STRL BLUE (TOWEL DISPOSABLE) IMPLANT
TRAP FLUID SMOKE EVACUATOR (MISCELLANEOUS) ×1 IMPLANT
TRAY FOLEY SLVR 16FR LF STAT (SET/KITS/TRAYS/PACK) ×1 IMPLANT
TUBING CONNECTING 10 (TUBING) IMPLANT
WAND WEREWOLF FASTSEAL 6.0 (MISCELLANEOUS) ×1 IMPLANT

## 2023-12-29 NOTE — Anesthesia Procedure Notes (Signed)
 Procedure Name: LMA Insertion Date/Time: 12/29/2023 11:16 AM  Performed by: Shereese Bonnie R, CRNAPre-anesthesia Checklist: Patient identified, Emergency Drugs available, Suction available, Patient being monitored and Timeout performed Patient Re-evaluated:Patient Re-evaluated prior to induction Oxygen Delivery Method: Circle system utilized Preoxygenation: Pre-oxygenation with 100% oxygen Induction Type: IV induction LMA: LMA inserted LMA Size: 3.0 Number of attempts: 1 Placement Confirmation: positive ETCO2 and breath sounds checked- equal and bilateral Tube secured with: Tape Dental Injury: Teeth and Oropharynx as per pre-operative assessment

## 2023-12-29 NOTE — Anesthesia Postprocedure Evaluation (Signed)
 Anesthesia Post Note  Patient: Debbie Sosa  Procedure(s) Performed: INCISION AND DRAINAGE OF HEMATOMA, THIGH (Left: Thigh)  Patient location during evaluation: PACU Anesthesia Type: General Level of consciousness: awake and alert, oriented and patient cooperative Pain management: pain level controlled Vital Signs Assessment: post-procedure vital signs reviewed and stable Respiratory status: spontaneous breathing, nonlabored ventilation and respiratory function stable Cardiovascular status: blood pressure returned to baseline and stable Postop Assessment: adequate PO intake Anesthetic complications: no   There were no known notable events for this encounter.   Last Vitals:  Vitals:   12/29/23 1245 12/29/23 1300  BP: 119/80 122/76  Pulse: (!) 105 89  Resp: 20 (!) 21  Temp: (!) 36.2 C   SpO2: 97% 96%    Last Pain:  Vitals:   12/29/23 1342  TempSrc:   PainSc: 10-Worst pain ever                 [name]

## 2023-12-29 NOTE — Transfer of Care (Signed)
 Immediate Anesthesia Transfer of Care Note  Patient: Debbie Sosa  Procedure(s) Performed: INCISION AND DRAINAGE OF HEMATOMA, THIGH (Left: Thigh)  Patient Location: PACU  Anesthesia Type:General  Level of Consciousness: drowsy  Airway & Oxygen Therapy: Patient Spontanous Breathing and Patient connected to face mask oxygen  Post-op Assessment: Report given to RN and Post -op Vital signs reviewed and stable  Post vital signs: Reviewed and stable  Last Vitals:  Vitals Value Taken Time  BP 119/80 12/29/23 12:46  Temp    Pulse 102 12/29/23 12:48  Resp 23 12/29/23 12:48  SpO2 97 % 12/29/23 12:48  Vitals shown include unfiled device data.  Last Pain:  Vitals:   12/29/23 0922  TempSrc: Temporal  PainSc: 10-Worst pain ever      Patients Stated Pain Goal: 3 (12/29/23 9077)  Complications: There were no known notable events for this encounter.

## 2023-12-29 NOTE — Anesthesia Preprocedure Evaluation (Addendum)
 Anesthesia Evaluation  Patient identified by MRN, date of birth, ID band Patient awake    Reviewed: Allergy & Precautions, NPO status , Patient's Chart, lab work & pertinent test results  History of Anesthesia Complications Negative for: history of anesthetic complications  Airway Mallampati: II   Neck ROM: Full    Dental no notable dental hx.    Pulmonary asthma , sleep apnea    Pulmonary exam normal breath sounds clear to auscultation       Cardiovascular Normal cardiovascular exam Rhythm:Regular Rate:Normal  ECG 09/08/23: normal   Neuro/Psych  Headaches    GI/Hepatic hiatal hernia,GERD  ,,S/p gastric bypass   Endo/Other  Obesity   Renal/GU Renal disease (nephrolithiasis)     Musculoskeletal  (+) Arthritis ,    Abdominal   Peds  Hematology  (+) Blood dyscrasia, anemia Anti-Pl antibody syndrome s/p PE    Anesthesia Other Findings   Reproductive/Obstetrics                              Anesthesia Physical Anesthesia Plan  ASA: 3  Anesthesia Plan: General   Post-op Pain Management:    Induction: Intravenous  PONV Risk Score and Plan: 3 and Treatment may vary due to age or medical condition and Ondansetron   Airway Management Planned: LMA  Additional Equipment:   Intra-op Plan:   Post-operative Plan: Extubation in OR  Informed Consent: I have reviewed the patients History and Physical, chart, labs and discussed the procedure including the risks, benefits and alternatives for the proposed anesthesia with the patient or authorized representative who has indicated his/her understanding and acceptance.     Dental advisory given  Plan Discussed with: CRNA  Anesthesia Plan Comments: (ETT backup if hip revision needed. Patient consented for risks of anesthesia including but not limited to:  - adverse reactions to medications - damage to eyes, teeth, lips or other oral mucosa -  nerve damage due to positioning  - sore throat or hoarseness - damage to heart, brain, nerves, lungs, other parts of body or loss of life  Informed patient about role of CRNA in peri- and intra-operative care.  Patient voiced understanding.)         Anesthesia Quick Evaluation

## 2023-12-29 NOTE — Op Note (Signed)
 Patient Name: Debbie Sosa  MRN: [id number]  Pre-Operative Diagnosis: Left thigh and hip hematoma  Post-Operative Diagnosis: (same)  Procedure: Left thigh and hip irrigation and debridement with drain placement and negative pressure incisional VAC  Components/Implants: 1 Hemovac  Date of Surgery: 12/29/2023  Surgeon: Arthea Sheer MD  Assistant: Annabella Gambler, RNFA (present and scrubbed throughout the case, critical for assistance with exposure, retraction and closure)   Anesthesiologist: Mazzoni  Anesthesia: General  EBL: 100  IVF:750cc  Complications: None   Brief history: The patient is a 48 year old-year-old female with a history of left total hip replacement over 3 months ago.  She presented with a hematoma of the thigh last week which was confirmed on MRI 2 days ago.  She had a negative aspiration attempt of the hip last week and repeat imaging showed a persistent large thigh hematoma.  The risks and benefits of surgical intervention with irrigation debridement and the possibility of needing revision to the hip replacement should any sort of infectious findings be encountered was discussed with the patient.  She agreed with the plan to move forward with left thigh hematoma irrigation and debridement with possible 1 component revision and washout of the hip should there be any signs of infection.  All preoperative films were reviewed and an appropriate surgical plan was made prior to surgery.   Description of procedure: The patient was brought to the operating room where laterality was confirmed by all those present to be the left side.  The patient was administered general anesthesia on a stretcher prior to being moved supine on the operating room table. Patient was given an intravenous dose of antibiotics for surgical prophylaxis  All bony prominences and extremities were well padded and the patient was securely attached to the table boots, a perineal post was placed  and the patient had a safety strap placed.  Surgical site was prepped with alcohol and chlorhexidine .  The entire lateral thigh and anterior hip were draped in usual sterile fashion with multiple layers of adhesive and adhesive drapes.   A surgical timeout was then called with participation of all staff in the room the patient was then a confirmed again and laterality confirmed.  Incision was made over the anterior lateral aspect of the proximal thigh in line with the TFL and the prior incision extending about 1 cm distally.SABRA  Appropriate retractors were placed and all bleeding vessels were coagulated within the subcutaneous and fatty layers.  An incision was made in the TFL fascia in the interval was carefully identified.  Upon opening the fascia there was a large gush of very dark red and black coagulated blood.  Blunt debridement was carried down and found that this extended down the lateral thigh into the vastus lateralis fascia all the way down to the distal lateral thigh.  This also tracked up to the anterior hip.  There were no signs of infection no signs of purulence and only dark blood was encountered.  The tissue surrounding the hematoma were healthy appearing with no evidence of significant inflammation.  A secondary incision was made at the distal aspect of the lateral thigh a few centimeters in line longitudinal and carried down to the vastus lateralis fascia.  The fascia was carefully incised with electrocautery and the same hematoma was encountered.  3 cultures were taken at the level of the hip and within the hematoma.  Blunt debridement and pulsatile lavage was used to irrigate and debride the entire hematoma from the 2 incisions.  The hip was also irrigated and debrided at the top of the incision using pulsatile lavage.  3 L of saline were run through the hematoma.  At this time retractors were placed to look for any bleeding origin.  The prior vessel ties were still in place from the original  replacement surgery with no evidence of bleeding at the hip.  There is some mild oozing along the edges of the vastus lateralis which were addressed with watercooled bipolar electrocautery.  The entire area was then irrigated again with saline and no active bleeding was found.  The hip and the hematoma were then irrigated again with Betadine based surgiphor solution which was allowed to sit for a few minutes before suctioning out.  It was then irrigated again with normal saline and the hematoma cavity was irrigated again with normal saline.  A Hemovac drain was then placed exiting distally to the distal incision.  The wound was then examined again for any signs of bleeding and there was no active bleeding.  The deep tissues were closed with 0 Vicryl and #1 STRATAFIX suture in the fascia.  Subcutaneous tissues were then irrigated again and closed with 0 Vicryl, 2-0 Vicryl, and staples on the skin.  A incisional VAC was placed across both incisions and brought to suction.  The patient was awoken from anesthesia transferred off of the operating room table onto a hospital bed with a good distal pulse.  The patient was then transferred to the PACU in stable condition.

## 2023-12-29 NOTE — Interval H&P Note (Signed)
 New MRI of the left femur performed 2 days ago showed redemonstration of the original hematoma in the left thigh.  I discussed with the patient treatment options and at this point I do think it is wise to consider irrigation debridement of the hematoma and evaluation of the hip to ensure there is no communication with the hip joint or no signs or symptoms of infection deeper.  She has no systemic symptoms of infection and her inflammatory markers are relatively unchanged from several days ago.  After reviewing the risks and benefits of left thigh irrigation debridement possible revision hip with headliner exchange patient agreed with the plan to move forward with surgical intervention today.  All of her questions were answered and we will plan to keep her in the hospital tonight with a drain should we encounter a large hematoma.

## 2023-12-30 ENCOUNTER — Encounter: Payer: Self-pay | Admitting: Orthopedic Surgery

## 2023-12-30 DIAGNOSIS — S7012XA Contusion of left thigh, initial encounter: Secondary | ICD-10-CM | POA: Diagnosis not present

## 2023-12-30 LAB — BASIC METABOLIC PANEL WITH GFR
Anion gap: 7 (ref 5–15)
BUN: 11 mg/dL (ref 6–20)
CO2: 26 mmol/L (ref 22–32)
Calcium: 9 mg/dL (ref 8.9–10.3)
Chloride: 102 mmol/L (ref 98–111)
Creatinine, Ser: 0.58 mg/dL (ref 0.44–1.00)
GFR, Estimated: >60 mL/min (ref >60)
Glucose, Bld: 103 mg/dL — ABNORMAL HIGH (ref 70–99)
Potassium: 4.3 mmol/L (ref 3.5–5.1)
Sodium: 136 mmol/L (ref 135–145)

## 2023-12-30 LAB — CBC
HCT: 28.4 % — ABNORMAL LOW (ref 36.0–46.0)
Hemoglobin: 9.4 g/dL — ABNORMAL LOW (ref 12.0–15.0)
MCH: 30.1 pg (ref 26.0–34.0)
MCHC: 33.1 g/dL (ref 30.0–36.0)
MCV: 91 fL (ref 80.0–100.0)
Platelets: 396 K/uL (ref 150–400)
RBC: 3.12 MIL/uL — ABNORMAL LOW (ref 3.87–5.11)
RDW: 13.2 % (ref 11.5–15.5)
WBC: 11.8 K/uL — ABNORMAL HIGH (ref 4.0–10.5)
nRBC: 0 % (ref 0.0–0.2)

## 2023-12-30 NOTE — Evaluation (Addendum)
 Occupational Therapy Evaluation Patient Details Name: Debbie Sosa MRN: [id number] DOB: January 20, 1975 Today's Date: 12/30/2023   History of Present Illness   Pt admitted to [facility] on 12/29/23 for elective I&D of LLE hematoma. Significant PMH includes: Rheumatoid arthritis, anemia with iron  deficiency and vitamin B12 deficiency, s/p gastric bypass, lymphedema, prior DVT/PE, lumbar radiculopathy, and recent L THA (9/24).     Clinical Impressions Pt seen this date for Occupational Therapy evaluation.  Pt lives at home with her son and granddaughter in a one story home with 6 steps to enter.  She presents with muscle weakness, decreased transfers, functional mobility, balance and decreased ability to perform self care and IADL tasks.  She was limited by pain this date during evaluation.  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  She would benefit from skilled OT services to maximize her safety and independence in necessary daily tasks. Recommend 3 in 1 commode.       If plan is discharge home, recommend the following:   Help with stairs or ramp for entrance;A little help with walking and/or transfers;A little help with bathing/dressing/bathroom;Assistance with cooking/housework;Assist for transportation     Functional Status Assessment   Patient has had a recent decline in their functional status and demonstrates the ability to make significant improvements in function in a reasonable and predictable amount of time.     Equipment Recommendations   BSC/3in1     Recommendations for Other Services         Precautions/Restrictions   Precautions Precautions: Fall Recall of Precautions/Restrictions: Intact Restrictions Weight Bearing Restrictions Per Provider Order: Yes LLE Weight Bearing Per Provider Order: Weight bearing as tolerated     Mobility Bed Mobility Overal bed mobility: Needs Assistance             General bed mobility comments: seated on BSC upon  entry; supervision for safety for sit>sup transfer, HOB elevated, use of RLE hooking under LLE for LE facilitation, increased time/effort and reliance on momentum to facilitate transfer    Transfers Overall transfer level: Needs assistance Equipment used: Rolling walker (2 wheels) Transfers: Sit to/from Stand Sit to Stand: Contact guard assist                  Balance Overall balance assessment: Needs assistance Sitting-balance support: No upper extremity supported, Feet supported Sitting balance-Leahy Scale: Good     Standing balance support: During functional activity, Reliant on assistive device for balance Standing balance-Leahy Scale: Fair                             ADL either performed or assessed with clinical judgement   ADL Overall ADL's : Needs assistance/impaired Eating/Feeding: Modified independent   Grooming: Set up;Sitting   Upper Body Bathing: Set up;Sitting   Lower Body Bathing: Maximal assistance Lower Body Bathing Details (indicate cue type and reason): increased pain in left lower extremity this date 8/10 Upper Body Dressing : Set up   Lower Body Dressing: Moderate assistance Lower Body Dressing Details (indicate cue type and reason): secondary to pain.  Will need instruction on reacher use for lower body dressing skills Toilet Transfer: Minimal assistance Toilet Transfer Details (indicate cue type and reason): use of grab bar and assist for lines and tubes Toileting- Clothing Manipulation and Hygiene: Modified independent       Functional mobility during ADLs: Contact guard assist General ADL Comments: assist for lines and tubes when amb  with walker to and from BR     Vision Baseline Vision/History: 1 Wears glasses       Perception         Praxis         Pertinent Vitals/Pain Pain Assessment Pain Score: 8  Pain Location: L thigh Pain Descriptors / Indicators: Sore, Constant Pain Intervention(s): Limited activity  within patient's tolerance, RN gave pain meds during session, Repositioned     Extremity/Trunk Assessment Upper Extremity Assessment Upper Extremity Assessment: Overall WFL for tasks assessed   Lower Extremity Assessment Lower Extremity Assessment: Defer to PT evaluation LLE Deficits / Details: edema, ace wrap to thigh LLE: Unable to fully assess due to pain       Communication Communication Communication: No apparent difficulties   Cognition Arousal: Alert Behavior During Therapy: WFL for tasks assessed/performed Cognition: No apparent impairments                               Following commands: Intact       Cueing  General Comments   Cueing Techniques: Verbal cues;Visual cues  L thigh wrapped in ACE bandage, no strikethrough; hemavac and wound vac intact   Exercises     Shoulder Instructions      Home Living Family/patient expects to be discharged to:: Private residence Living Arrangements: Children Available Help at Discharge: Available PRN/intermittently Type of Home: House Home Access: Stairs to enter [employer] of Steps: 6 Entrance Stairs-Rails: Right;Left;Can reach both Home Layout: One level     Bathroom Shower/Tub: Producer, Television/film/video: Standard Bathroom Accessibility: Yes   Home Equipment: Agricultural Consultant (2 wheels);Cane - single point;Shower seat   Additional Comments: reacher      Prior Functioning/Environment Prior Level of Function : Independent/Modified Independent;Driving;Working/employed             Mobility Comments: IND with ambulation without AD, active with OPPT ADLs Comments: IND with ADL's, IADL's, medication management. 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.    OT Problem List: Decreased range of motion;Decreased activity tolerance;Impaired balance (sitting and/or standing);Pain;Decreased knowledge of use of DME or AE   OT Treatment/Interventions: Self-care/ADL training;Therapeutic  exercise;Patient/family education;Balance training;Therapeutic activities;DME and/or AE instruction      OT Goals(Current goals can be found in the care plan section)   Acute Rehab OT Goals Patient Stated Goal: pt would like to return home and be as independent as possible OT Goal Formulation: With patient Time For Goal Achievement: 01/13/24 Potential to Achieve Goals: Good ADL Goals Pt Will Perform Lower Body Bathing: (P) with set-up;with contact guard assist Pt Will Perform Lower Body Dressing: (P) with supervision Pt Will Transfer to Toilet: (P) with supervision   OT Frequency:  Min 3X/week    Co-evaluation              AM-PAC OT 6 Clicks Daily Activity     Outcome Measure Help from another person eating meals?: None Help from another person taking care of personal grooming?: None Help from another person toileting, which includes using toliet, bedpan, or urinal?: A Little Help from another person bathing (including washing, rinsing, drying)?: A Lot Help from another person to put on and taking off regular upper body clothing?: None Help from another person to put on and taking off regular lower body clothing?: A Lot 6 Click Score: 19   End of Session Equipment Utilized During Treatment: Gait belt;Rolling walker (2 wheels)  Activity Tolerance: Patient  limited by pain Patient left: with call bell/phone within reach;in bed;with bed alarm set  OT Visit Diagnosis: Other abnormalities of gait and mobility (R26.89);Pain;Unsteadiness on feet (R26.81)                Time: 1345-1441 OT Time Calculation (min): 56 min Charges:  OT General Charges $OT Visit: 1 Visit OT Evaluation $OT Eval Moderate Complexity: 1 Mod OT Treatments $Self Care/Home Management : 23-37 mins  Alianys Chacko T Yalexa Blust, OTR/L, CLT Tracee Mccreery 12/30/2023, 3:05 PM

## 2023-12-30 NOTE — Progress Notes (Signed)
°  Subjective: 1 Day Post-Op Procedures (LRB): INCISION AND DRAINAGE OF HEMATOMA, THIGH (Left) Patient reports pain as moderate.   Patient is well, and has had no acute complaints or problems Plan is to go Home after hospital stay. Negative for chest pain and shortness of breath Fever: no Gastrointestinal:Negative for nausea and vomiting  Objective: Vital signs in last 24 hours: Temp:  [97.1 F (36.2 C)-98.4 F (36.9 C)] 98 F (36.7 C) (12/13 0812) Pulse Rate:  [68-105] 68 (12/13 0812) Resp:  [14-21] 19 (12/13 0812) BP: (94-122)/(62-92) 104/66 (12/13 0812) SpO2:  [90 %-100 %] 100 % (12/13 0812) Weight:  [91 kg] 91 kg (12/12 0922)  Intake/Output from previous day:  Intake/Output Summary (Last 24 hours) at 12/30/2023 0900 Last data filed at 12/30/2023 0506 Gross per 24 hour  Intake 2175.35 ml  Output 545 ml  Net 1630.35 ml    Intake/Output this shift: No intake/output data recorded.  Labs: Recent Labs    12/30/23 0522  HGB 9.4*   Recent Labs    12/30/23 0522  WBC 11.8*  RBC 3.12*  HCT 28.4*  PLT 396   Recent Labs    12/30/23 0522  NA 136  K 4.3  CL 102  CO2 26  BUN 11  CREATININE 0.58  GLUCOSE 103*  CALCIUM 9.0   No results for input(s): LABPT, INR in the last 72 hours.   EXAM General - Patient is Alert, Appropriate, and Oriented Extremity - ABD soft Neurovascular intact Dorsiflexion/Plantar flexion intact No cellulitis present Compartment soft Dressing/Incision - Prevena intact without drainage.  Hemovac in place, minimal bloody drainage noted. Thigh is soft to palpation. Motor Function - intact, moving foot and toes well on exam.  Abdomen soft with intact bowel sounds.  Past Medical History:  Diagnosis Date   Anemia    Antiphospholipid antibody positive    Arthritis    Asthma    seasonal   Chronic venous insufficiency    COVID-19 2020   DDD (degenerative disc disease), lumbar    GERD (gastroesophageal reflux disease)    Headache     h/o migraines   History of hiatal hernia    repaired with Gastric bypass   History of kidney stones    Iron  deficiency    recieves iron  infusions every 3 [facility]   Lymphedema    bil legs-sees [facility]   Nausea and vomiting 10/14/2015   Obesity    Pneumonia    Pulmonary embolism (HCC)    after 2nd c-section   Sleep apnea    had gastric bypass and does not need to use cpap since surgery   Vitamin B12 deficiency     Assessment/Plan: 1 Day Post-Op Procedures (LRB): INCISION AND DRAINAGE OF HEMATOMA, THIGH (Left) Principal Problem:   Hematoma of left thigh, subsequent encounter  Estimated body mass index is 37.91 kg/m as calculated from the following:   Height as of this encounter: 5' 1 (1.549 m).   Weight as of this encounter: 91 kg. Advance diet Up with therapy  Labs and vitals reviewed, WBC 11.8.  Hg 9.4. Up with therapy today. Hemovac with 45cc of drainage.  Will plan on removing tomorrow AM. Possible d/c home tomorrow pending drain removal.  DVT Prophylaxis - Lovenox  and TED hose Weight-Bearing as tolerated to left leg  J. Gustavo Level, PA-C [facility] 12/30/2023, 9:00 AM

## 2023-12-30 NOTE — Evaluation (Signed)
 Physical Therapy Evaluation Patient Details Name: Debbie Sosa MRN: [id number] DOB: 08/28/1975 Today's Date: 12/30/2023  History of Present Illness  Pt admitted to [facility] on 12/29/23 for elective I&D of LLE hematoma. Significant PMH includes: Rheumatoid arthritis, anemia with iron  deficiency and vitamin B12 deficiency, s/p gastric bypass, lymphedema, prior DVT/PE, lumbar radiculopathy, and recent L THA (9/24).   Clinical Impression  Pt received sitting on BSC after BM and is agreeable for PT eval. At baseline, pt is IND with ADLs, IADL's, ambulation without AD, driving, medication management, and working as a [employer] officer.   Pt presents with increased pain levels, LLE weakness/limited AROM secondary to acuity of sx, decreased standing balance, and decreased activity tolerance, resulting in impaired functional mobility from baseline. Due to deficits, pt required supervision for bed mobility, transfers, and ambulation with RW.   Deficits limit the pt's ability to safely and independently perform ADL's, transfer, and ambulate. Pt will benefit from acute skilled PT services to address deficits for return to baseline function. Pt will benefit from post acute therapy services to address deficits for return to baseline function.  Encourage OOB mobility with nursing and mobility tech for meals and toileting for continued progress towards goals and maintenance of IND with functional mobility while hospitalized.          If plan is discharge home, recommend the following: A little help with bathing/dressing/bathroom;Assistance with cooking/housework;Help with stairs or ramp for entrance;Assist for transportation   Can travel by private vehicle   Yes    Equipment Recommendations None recommended by PT     Functional Status Assessment Patient has had a recent decline in their functional status and demonstrates the ability to make significant improvements in function in a reasonable and  predictable amount of time.     Precautions / Restrictions Precautions Precautions: Fall Recall of Precautions/Restrictions: Intact Restrictions Weight Bearing Restrictions Per Provider Order: Yes LLE Weight Bearing Per Provider Order: Weight bearing as tolerated      Mobility  Bed Mobility               General bed mobility comments: seated on BSC upon entry; supervision for safety for sit>sup transfer, HOB elevated, use of RLE hooking under LLE for LE facilitation, increased time/effort and reliance on momentum to facilitate transfer    Transfers                   General transfer comment: supervision for safety for STS transfers at EOB and BSC with RW, verbal cues for safety, sequencing, hand placement, and LLE positioning for pain management. demo's good eccentric lowering.    Ambulation/Gait   Gait Distance (Feet): 15 Feet           General Gait Details: supervision for safety to ambulate short distance with RW, demo's slowed cadence, decreased step length (R>L) and foot clearance (L>R), increased UE WB during L stance, and increased posterior trunk lean with LLE facilitation.     Balance Overall balance assessment: Needs assistance Sitting-balance support: No upper extremity supported, Feet supported Sitting balance-Leahy Scale: Good     Standing balance support: During functional activity, Reliant on assistive device for balance Standing balance-Leahy Scale: Fair Standing balance comment: in RW                             Pertinent Vitals/Pain Pain Assessment Pain Assessment: 0-10 Pain Score: 7  Pain Location: L thigh Pain Descriptors /  Indicators: Sore, Constant Pain Intervention(s): Limited activity within patient's tolerance, Monitored during session, Repositioned    Home Living Family/patient expects to be discharged to:: Private residence Living Arrangements: Children Available Help at Discharge: Available  PRN/intermittently Type of Home: House Home Access: Stairs to enter Entrance Stairs-Rails: Right;Left;Can reach both Entrance Stairs-Number of Steps: 6   Home Layout: One level Home Equipment: [employer] Consultant (2 wheels);BSC/3in1;Cane - single point;Shower seat Additional Comments: reacher    Prior Function Prior Level of Function : Independent/Modified Independent;Driving;Working/employed             Mobility Comments: IND with ambulation without AD, active with OPPT ADLs Comments: IND with ADL's, IADL's, medication management. 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     Extremity/Trunk Assessment   Upper Extremity Assessment Upper Extremity Assessment: Overall WFL for tasks assessed    Lower Extremity Assessment Lower Extremity Assessment: Overall WFL for tasks assessed;LLE deficits/detail LLE Deficits / Details: edema, ace wrap to thigh LLE: Unable to fully assess due to pain       Communication   Communication Communication: No apparent difficulties    Cognition Arousal: Alert Behavior During Therapy: WFL for tasks assessed/performed   PT - Cognitive impairments: No apparent impairments                         Following commands: Intact       Cueing Cueing Techniques: Verbal cues, Visual cues     General Comments General comments (skin integrity, edema, etc.): L thigh wrapped in ACE bandage, no strikethrough; hemavac and wound vac intact    Exercises Other Exercises Other Exercises: Pt edu re: PT role/POC, DC recommendations, IND for transfer to/from [facility] for toileting, LLE WB precautions, call for help, OOB for toileting/meals, mobility tech   Assessment/Plan    PT Assessment Patient needs continued PT services  PT Problem List Decreased strength;Decreased activity tolerance;Decreased mobility;Decreased knowledge of use of DME;Pain;Decreased balance       PT Treatment Interventions DME instruction;Gait training;Stair training;Functional mobility  training;Therapeutic exercise;Balance training;Patient/family education;Neuromuscular re-education;Therapeutic activities    PT Goals (Current goals can be found in the Care Plan section)  Acute Rehab PT Goals 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 PT Goal Formulation: With patient Time For Goal Achievement: 01/13/24 Potential to Achieve Goals: Good    Frequency Min 3X/week        AM-PAC PT 6 Clicks Mobility  Outcome Measure Help needed turning from your back to your side while in a flat bed without using bedrails?: A Little Help needed moving from lying on your back to sitting on the side of a flat bed without using bedrails?: A Little Help needed moving to and from a bed to a chair (including a wheelchair)?: A Little Help needed standing up from a chair using your arms (e.g., wheelchair or bedside chair)?: A Little Help needed to walk in hospital room?: A Little Help needed climbing 3-5 steps with a railing? : A Lot 6 Click Score: 17    End of Session Equipment Utilized During Treatment: Gait belt Activity Tolerance: Patient tolerated treatment well Patient left: with call bell/phone within reach;in bed Nurse Communication: Mobility status PT Visit Diagnosis: Difficulty in walking, not elsewhere classified (R26.2);Muscle weakness (generalized) (M62.81);Pain Pain - Right/Left: Left Pain - part of body: Leg    Time: 1100-1120 PT Time Calculation (min) (ACUTE ONLY): 20 min   Charges:   PT Evaluation $PT Eval Moderate Complexity: 1 Mod PT Treatments $Therapeutic Activity:  8-22 mins PT General Charges $$ ACUTE PT VISIT: 1 Visit        Camie CHARLENA Kluver, PT, DPT 12:49 PM,12/30/2023 Physical Therapist - [facility]

## 2023-12-31 DIAGNOSIS — S7012XA Contusion of left thigh, initial encounter: Secondary | ICD-10-CM | POA: Diagnosis not present

## 2023-12-31 LAB — CBC
HCT: 27.6 % — ABNORMAL LOW (ref 36.0–46.0)
Hemoglobin: 8.9 g/dL — ABNORMAL LOW (ref 12.0–15.0)
MCH: 30.1 pg (ref 26.0–34.0)
MCHC: 32.2 g/dL (ref 30.0–36.0)
MCV: 93.2 fL (ref 80.0–100.0)
Platelets: 378 K/uL (ref 150–400)
RBC: 2.96 MIL/uL — ABNORMAL LOW (ref 3.87–5.11)
RDW: 13.6 % (ref 11.5–15.5)
WBC: 8.8 K/uL (ref 4.0–10.5)
nRBC: 0 % (ref 0.0–0.2)

## 2023-12-31 MED ORDER — ACETAMINOPHEN 500 MG PO TABS
1000.0000 mg | ORAL_TABLET | Freq: Three times a day (TID) | ORAL | Status: DC
  Administered 2024-01-01 – 2024-01-02 (×4): 1000 mg via ORAL
  Filled 2023-12-31 (×5): qty 2

## 2023-12-31 NOTE — TOC Progression Note (Addendum)
 Transition of Care [facility]) - Progression Note    Patient Details  Name: Debbie Sosa MRN: [id number] Date of Birth: 08-Apr-1975  [facility] CM/SW Contact  Lorraine LILLETTE Fenton, LCSW Phone Number: 12/31/2023, 9:10 AM  Clinical Narrative:     CSW spoke with pt, PT in the room.  Briefly discussed DC plan.  Pt states that she is aware that SNF would offer more therapy opportunities than HHPT, but that she is not certain. She does not feel their are facilities in the area that she  has heard a good report. She also wants to be sure that her pain is properly managed. CSW   advised her that the MD determines pain script/dosage and she has a facility choice.  CSW agreed to follow up with her at a later time.  ICM following.   Addendum:  CSW visited pt at bedside, her sister was present.  We discussed SNF and choice.  Pt would like skilled rehabilitation.   Her choices are [facility] and [facility].   CSW agreed to start the process. Barriers to Discharge: Continued Medical Work up               Expected Discharge Plan and Services       Living arrangements for the past 2 months: Single Family Home                                       Social Drivers of Health (SDOH) Interventions SDOH Screenings   Food Insecurity: No Food Insecurity (12/30/2023)  Recent Concern: Food Insecurity - Food Insecurity Present (10/02/2023)   Received from [facility]  Housing: Low Risk (12/30/2023)  Recent Concern: Housing - High Risk (10/02/2023)   Received from [facility]  Transportation Needs: No Transportation Needs (12/30/2023)  Utilities: Not At Risk (12/30/2023)  Depression (PHQ2-9): Low Risk (11/28/2023)  Financial Resource Strain: Medium Risk (10/02/2023)   Received from [facility]  Social Connections: Socially Isolated (08/22/2023)  Tobacco Use: Low Risk (12/29/2023)    Readmission Risk  Interventions     No data to display

## 2023-12-31 NOTE — Progress Notes (Signed)
 Physical Therapy Treatment Patient Details Name: Debbie Sosa MRN: [id number] DOB: 1975-12-05 Today's Date: 12/31/2023   History of Present Illness Pt admitted to [facility] on 12/29/23 for elective I&D of LLE hematoma. Significant PMH includes: Rheumatoid arthritis, anemia with iron  deficiency and vitamin B12 deficiency, s/p gastric bypass, lymphedema, prior DVT/PE, lumbar radiculopathy, and recent L THA (9/24).    PT Comments  Pt ready for session.  Bed is positioned with no bed features and generally flat to simulate bed at home.  She is able to get to EOB with inc time and supervision but no outside assist.  Steady in sitting.  Stands with cga x 1 and is able to walk in/out of bathroom with heavy reliance on RW and generally slow gait but no LOB or buckling.  Mananges her own clothing, care and washing hands without LOB.  She does struggle to get back to bed and needs mod a x 1 to reuturn to supine.  Denied recliner at this time.  Long discussion regarding rehab option.  Discussed risks/benefits.  While she lived with family, she stated she has little physical support.  She is encouraged to have discussion with family to see if they can provide increased support at home until she is more mobility as she does have some hesitance about rehab but also her ability to manage at home.  She would benefit from +1 assist from general mobility, RW and BSC available at discharge.  Will see pt again in am to help with mobility and discharge planning.     If plan is discharge home, recommend the following: A little help with bathing/dressing/bathroom;Assistance with cooking/housework;Help with stairs or ramp for entrance;Assist for transportation   Can travel by private vehicle     Yes  Equipment Recommendations  None recommended by PT    Recommendations for Other Services       Precautions / Restrictions Precautions Precautions: Fall Recall of Precautions/Restrictions: Intact Restrictions Weight  Bearing Restrictions Per Provider Order: Yes LLE Weight Bearing Per Provider Order: Weight bearing as tolerated     Mobility  Bed Mobility Overal bed mobility: Needs Assistance Bed Mobility: Supine to Sit, Sit to Supine     Supine to sit: Contact guard, Supervision Sit to supine: Min assist   General bed mobility comments: no bed features to simulate home environment Patient Response: Cooperative  Transfers Overall transfer level: Needs assistance Equipment used: Rolling walker (2 wheels) Transfers: Sit to/from Stand Sit to Stand: Contact guard assist                Ambulation/Gait Ambulation/Gait assistance: Supervision, Contact guard assist Gait Distance (Feet): 15 Feet Assistive device: Rolling walker (2 wheels) Gait Pattern/deviations: Step-to pattern, Step-through pattern, Antalgic, Decreased stance time - left, Decreased step length - right Gait velocity: decreased     General Gait Details: generally steady but slow and effortul   Stairs         General stair comments: has 6 step with B rails into home   Wheelchair Mobility     Tilt Bed Tilt Bed Patient Response: Cooperative  Modified Rankin (Stroke Patients Only)       Balance Overall balance assessment: Mild deficits observed, not formally tested, Needs assistance Sitting-balance support: Feet supported Sitting balance-Leahy Scale: Good     Standing balance support: Bilateral upper extremity supported, During functional activity, No upper extremity supported Standing balance-Leahy Scale: Fair Standing balance comment: able to let go for self care and washing hands at sink  Communication Communication Communication: No apparent difficulties  Cognition Arousal: Alert Behavior During Therapy: WFL for tasks assessed/performed   PT - Cognitive impairments: No apparent impairments                         Following commands: Intact       Cueing Cueing Techniques: Verbal cues, Visual cues  Exercises      General Comments        Pertinent Vitals/Pain Pain Assessment Pain Assessment: Faces Faces Pain Scale: Hurts little more Pain Location: L thigh, R post lower leg cramping Pain Descriptors / Indicators: Sore, Constant Pain Intervention(s): Limited activity within patient's tolerance, Monitored during session, Premedicated before session, Repositioned    Home Living                          Prior Function            PT Goals (current goals can now be found in the care plan section) Progress towards PT goals: Progressing toward goals    Frequency    Min 3X/week      PT Plan      Co-evaluation              AM-PAC PT 6 Clicks Mobility   Outcome Measure  Help needed turning from your back to your side while in a flat bed without using bedrails?: None Help needed moving from lying on your back to sitting on the side of a flat bed without using bedrails?: A Little Help needed moving to and from a bed to a chair (including a wheelchair)?: A Little Help needed standing up from a chair using your arms (e.g., wheelchair or bedside chair)?: A Little Help needed to walk in hospital room?: A Little Help needed climbing 3-5 steps with a railing? : A Lot 6 Click Score: 18    End of Session Equipment Utilized During Treatment: Gait belt Activity Tolerance: Patient tolerated treatment well Patient left: with call bell/phone within reach;in bed Nurse Communication: Mobility status PT Visit Diagnosis: Difficulty in walking, not elsewhere classified (R26.2);Muscle weakness (generalized) (M62.81);Pain Pain - Right/Left: Left Pain - part of body: Leg     Time: 0902-0927 PT Time Calculation (min) (ACUTE ONLY): 25 min  Charges:    $Gait Training: 8-22 mins $Therapeutic Exercise: 8-22 mins PT General Charges $$ ACUTE PT VISIT: 1 Visit                   Lauraine Gills, PTA 12/31/2023, 10:04  AM

## 2023-12-31 NOTE — Progress Notes (Signed)
 Subjective: 2 Days Post-Op Procedures (LRB): INCISION AND DRAINAGE OF HEMATOMA, THIGH (Left) Patient reports pain as mild this morning.  Patient is well, and has had no acute complaints or problems Plan is to go Home after hospital stay.  Walked 15 feet with therapy yesterday. Negative for chest pain and shortness of breath Fever: no Gastrointestinal:Negative for nausea and vomiting  Objective: Vital signs in last 24 hours: Temp:  [97.5 F (36.4 C)-98.5 F (36.9 C)] 98.3 F (36.8 C) (12/14 0800) Pulse Rate:  [72-107] 77 (12/14 0800) Resp:  [17-18] 17 (12/14 0800) BP: (99-116)/(53-74) 99/62 (12/14 0800) SpO2:  [98 %-100 %] 98 % (12/14 0800)  Intake/Output from previous day:  Intake/Output Summary (Last 24 hours) at 12/31/2023 1004 Last data filed at 12/31/2023 0450 Gross per 24 hour  Intake 240 ml  Output 20 ml  Net 220 ml    Intake/Output this shift: No intake/output data recorded.  Labs: Recent Labs    12/30/23 0522 12/31/23 0526  HGB 9.4* 8.9*   Recent Labs    12/30/23 0522 12/31/23 0526  WBC 11.8* 8.8  RBC 3.12* 2.96*  HCT 28.4* 27.6*  PLT 396 378   Recent Labs    12/30/23 0522  NA 136  K 4.3  CL 102  CO2 26  BUN 11  CREATININE 0.58  GLUCOSE 103*  CALCIUM 9.0   No results for input(s): LABPT, INR in the last 72 hours.   EXAM General - Patient is Alert, Appropriate, and Oriented Extremity - ABD soft Neurovascular intact Dorsiflexion/Plantar flexion intact No cellulitis present Compartment soft Dressing/Incision - Prevena intact without drainage.  Hemovac in place, minimal bloody drainage noted. Thigh is soft to palpation.  Hemovac was removed without issue.  Prevena was re-sealed following prevena removal. Motor Function - intact, moving foot and toes well on exam.  Abdomen soft with intact bowel sounds.  Past Medical History:  Diagnosis Date   Anemia    Antiphospholipid antibody positive    Arthritis    Asthma    seasonal    Chronic venous insufficiency    COVID-19 2020   DDD (degenerative disc disease), lumbar    GERD (gastroesophageal reflux disease)    Headache    h/o migraines   History of hiatal hernia    repaired with Gastric bypass   History of kidney stones    Iron  deficiency    recieves iron  infusions every 3 months at the cancer center   Lymphedema    bil legs-sees [place] V&V   Nausea and vomiting 10/14/2015   Obesity    Pneumonia    Pulmonary embolism (HCC)    after 2nd c-section   Sleep apnea    had gastric bypass and does not need to use cpap since surgery   Vitamin B12 deficiency     Assessment/Plan: 2 Days Post-Op Procedures (LRB): INCISION AND DRAINAGE OF HEMATOMA, THIGH (Left) Principal Problem:   Hematoma of left thigh, subsequent encounter  Estimated body mass index is 37.91 kg/m as calculated from the following:   Height as of this encounter: 5' 1 (1.549 m).   Weight as of this encounter: 91 kg. Advance diet Up with therapy  Labs and vitals reviewed, WBC down to 8.8.  Hg 8.9 Up with therapy today.  Walked 15 feet with PT yesterday.  Will see how she does today prior to possible d/c home. Hemovac with 20cc of drainage last 12 hours..  Hemovac was removed without issue. Continue Prevena when returning home for one  week before changing to dry dressing. Possible d/c home today pending her progress with PT.  DVT Prophylaxis - Lovenox  and TED hose Weight-Bearing as tolerated to left leg  J. Gustavo Level, PA-C [facility] Orthopaedic Surgery 12/31/2023, 10:04 AM

## 2023-12-31 NOTE — NC FL2 (Signed)
 [place]  MEDICAID FL2 LEVEL OF CARE FORM     IDENTIFICATION  Patient Name: Debbie Sosa Birthdate: 1975-09-04 Sex: female Admission Date (Current Location): 12/29/2023  [facility] and [place] Number:  Chiropodist and Address:  [facility], [address] [place], [place] [address]      Provider Number: [id number]  Attending Physician Name and Address:  Lorelle Hussar, MD  Relative Name and Phone Number:  Damewood,Brenda  Mother, Emergency Contact  [phone] (Mobile)    Current Level of Care: Hospital Recommended Level of Care: Skilled Nursing Facility Prior Approval Number:    Date Approved/Denied:   PASRR Number: [id number]  Discharge Plan: SNF    Current Diagnoses: Patient Active Problem List   Diagnosis Date Noted   Hematoma of left thigh, subsequent encounter 12/29/2023   Myositis of left thigh 12/22/2023   Left leg pain 12/22/2023   Intramuscular hematoma, left thigh 12/21/2023   History of venous thromboembolism (DVT/PE) 12/21/2023   Multiple drug allergies 12/21/2023   History of pulmonary embolism 10/21/2023   S/P total left hip arthroplasty 09/21/2023   Lumbar radiculitis 08/23/2023   Left hip pain 08/22/2023   Osteoarthritis of left hip 06/21/2023   Prediabetes 02/06/2023   Muscle weakness 01/24/2023   Pain of left hip joint 01/24/2023   Hyperglycemia 01/25/2022   Arthritis of left foot 11/01/2021   Menorrhagia with regular cycle 09/17/2021   Retained intrauterine contraceptive device (IUD) 09/17/2021   Vitamin D  deficiency 09/01/2021   Lymphedema, chronic 03/07/2021   Chronic venous insufficiency 03/07/2021   Iron  deficiency anemia secondary to inadequate dietary iron  intake 02/05/2021   B12 deficiency 08/20/2020   Antiphospholipid antibody positive 01/27/2020   Iron  deficiency 01/27/2020   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   Rheumatoid arthritis involving multiple sites with positive  rheumatoid factor (HCC) 09/19/2019   History of COVID-19 03/25/2019   Constipation 01/27/2019   Herpes zoster without complication 01/27/2019   Hypoxia 01/13/2019   Pneumonia due to COVID-19 virus 01/13/2019   Personal history of pulmonary embolism 01/13/2019   Bilateral leg pain 01/13/2019   Allergic rhinitis 12/04/2017   Acute laryngitis 08/17/2016   Dysphagia 08/17/2016   Meningitis 08/17/2016   Singers' nodes 08/17/2016   Diarrhea, unspecified 10/14/2015   Left lower quadrant pain 10/14/2015   Nausea and vomiting 10/14/2015   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   History of gastric bypass 07/23/2015   Central sleep apnea 01/07/2015   Obesity (BMI 30-39.9) 01/07/2015   GERD (gastroesophageal reflux disease) 12/25/2014   Pulmonary embolism (HCC)    Plantar fasciitis of right foot 12/31/2013    Orientation RESPIRATION BLADDER Height & Weight     Self, Time, Situation, Place  Normal Continent Weight: 200 lb 9.9 oz (91 kg) Height:  5' 1 (154.9 cm)  BEHAVIORAL SYMPTOMS/MOOD NEUROLOGICAL BOWEL NUTRITION STATUS      Continent Diet  AMBULATORY STATUS COMMUNICATION OF NEEDS Skin   Limited Assist   Normal                       Personal Care Assistance Level of Assistance              Functional Limitations Info             SPECIAL CARE FACTORS FREQUENCY  PT (By licensed PT), OT (By licensed OT)     PT Frequency: 5X week OT Frequency: 5X week            Contractures Contractures Info:  Not present    Additional Factors Info  Code Status, Allergies Code Status Info: Full Allergies Info: Amoxicillin , Avocado, Cephalexin , Fexofenadine, Iodinated Contrast Media, Ketorolac , Morphine, Penicillins, Phenazopyridine, Promethazine, Diflucan  (Fluconazole ), Cetirizine, Diclofenac  Sodium, Diclofenac  Sodium, Doxycycline, Hepatitis B Vaccine, Latex, Levofloxacin , Nsaids, Other, Tramadol , Hepatitis B Virus Vaccines, Misc. Sulfonamide Containing Compounds, Morphine And  Codeine, Naproxen, Oxycodone -acetaminophen , Pyridium (Phenazopyridine Hcl), Sulfa Antibiotics, Sulfasalazine, Vioxx (Rofecoxib)           Current Medications (12/31/2023):  This is the current hospital active medication list Current Facility-Administered Medications  Medication Dose Route Frequency Provider Last Rate Last Admin   0.9 %  sodium chloride  infusion   Intravenous Continuous Aberman, [name], MD 100 mL/hr at 12/31/23 0108 New Bag at 12/31/23 0108   diphenhydrAMINE  (BENADRYL ) 12.5 MG/5ML elixir 12.5-25 mg  12.5-25 mg Oral Q4H PRN Aberman, [name], MD   12.5 mg at 12/30/23 2230   docusate sodium  (COLACE) capsule 100 mg  100 mg Oral BID Aberman, [name], MD   100 mg at 12/31/23 9176   enoxaparin  (LOVENOX ) injection 45 mg  0.5 mg/kg Subcutaneous Q24H Aberman, [name], MD   45 mg at 12/31/23 9176   menthol  (CEPACOL) lozenge 3 mg  1 lozenge Oral PRN Aberman, [name], MD       Or   phenol (CHLORASEPTIC) mouth spray 1 spray  1 spray Mouth/Throat PRN Aberman, [name], MD       methocarbamol  (ROBAXIN ) tablet 500 mg  500 mg Oral Q6H PRN Tobie Priest, MD   500 mg at 12/31/23 0445   metoCLOPramide  (REGLAN ) tablet 5-10 mg  5-10 mg Oral Q8H PRN Aberman, [name], MD       Or   metoCLOPramide  (REGLAN ) injection 5-10 mg  5-10 mg Intravenous Q8H PRN Aberman, [name], MD       ondansetron  (ZOFRAN ) tablet 4 mg  4 mg Oral Q6H PRN Aberman, [name], MD       Or   ondansetron  (ZOFRAN ) injection 4 mg  4 mg Intravenous Q6H PRN Aberman, [name], MD       oxyCODONE  (Oxy IR/ROXICODONE ) immediate release tablet 2.5 mg  2.5 mg Oral Q4H PRN Aberman, [name], MD       oxyCODONE  (Oxy IR/ROXICODONE ) immediate release tablet 5 mg  5 mg Oral Q4H PRN Aberman, [name], MD   5 mg at 12/31/23 0704   pantoprazole  (PROTONIX ) EC tablet 40 mg  40 mg Oral Daily Aberman, [name], MD   40 mg at 12/31/23 9177     Discharge Medications: Please see discharge summary for a list of discharge medications.  Relevant Imaging  Results:  Relevant Lab Results:   Additional Information [id number]  Lorraine LILLETTE Fenton, LCSW

## 2024-01-01 ENCOUNTER — Encounter: Payer: Self-pay | Admitting: Internal Medicine

## 2024-01-01 ENCOUNTER — Other Ambulatory Visit: Payer: Self-pay

## 2024-01-01 DIAGNOSIS — S7012XA Contusion of left thigh, initial encounter: Secondary | ICD-10-CM | POA: Diagnosis not present

## 2024-01-01 LAB — AEROBIC CULTURE W GRAM STAIN (SUPERFICIAL SPECIMEN)
Culture: NO GROWTH
Culture: NO GROWTH
Culture: NO GROWTH
Gram Stain: NONE SEEN
Gram Stain: NONE SEEN
Gram Stain: NONE SEEN

## 2024-01-01 MED ORDER — BISACODYL 10 MG RE SUPP: 10.0000 mg | Freq: Every day | RECTAL | Status: DC | PRN

## 2024-01-01 MED ORDER — MAGNESIUM HYDROXIDE 400 MG/5ML PO SUSP
30.0000 mL | Freq: Every day | ORAL | Status: DC | PRN
  Administered 2024-01-01: 10:00:00 30 mL via ORAL
  Filled 2024-01-01: qty 30

## 2024-01-01 MED ORDER — ONDANSETRON HCL 4 MG PO TABS
4.0000 mg | ORAL_TABLET | Freq: Four times a day (QID) | ORAL | 0 refills | Status: AC | PRN
  Filled 2024-01-01: qty 18, 21d supply, fill #0

## 2024-01-01 MED ORDER — POLYETHYLENE GLYCOL 3350 17 G PO PACK
17.0000 g | PACK | Freq: Every day | ORAL | Status: DC
  Administered 2024-01-02: 10:00:00 17 g via ORAL
  Filled 2024-01-01 (×2): qty 1

## 2024-01-01 MED ORDER — DOCUSATE SODIUM 100 MG PO CAPS
100.0000 mg | ORAL_CAPSULE | Freq: Two times a day (BID) | ORAL | 0 refills | Status: AC
  Filled 2024-01-01: qty 10, 5d supply, fill #0

## 2024-01-01 MED ORDER — DIAZEPAM 5 MG PO TABS
5.0000 mg | ORAL_TABLET | Freq: Three times a day (TID) | ORAL | Status: DC | PRN
  Administered 2024-01-01: 12:00:00 5 mg via ORAL
  Filled 2024-01-01: qty 1

## 2024-01-01 MED ORDER — ACETAMINOPHEN 500 MG PO TABS
1000.0000 mg | ORAL_TABLET | Freq: Three times a day (TID) | ORAL | 0 refills | Status: AC
  Filled 2024-01-01: qty 30, 5d supply, fill #0

## 2024-01-01 MED ORDER — DIAZEPAM 5 MG PO TABS: 5.0000 mg | ORAL_TABLET | Freq: Three times a day (TID) | ORAL | 0 refills | Status: DC | PRN

## 2024-01-01 MED ORDER — OXYCODONE HCL 5 MG PO TABS: 2.5000 mg | ORAL_TABLET | Freq: Four times a day (QID) | ORAL | 0 refills | Status: AC | PRN

## 2024-01-01 NOTE — Discharge Instructions (Signed)
 Diet: As you were doing prior to hospitalization   Dressing: Keep dressing clean and dry. We will remove dressing at 2 week follow up.  Shower:  May shower but keep the wounds dry, use an occlusive plastic wrap, NO SOAKING IN TUB.  If the bandage gets wet, change with a clean dry gauze.   Activity:  Increase activity slowly as tolerated, but follow the weight bearing instructions below.  No lifting or driving for 6 weeks.  Weight Bearing:   Weight bearing as tolerated to left lower extremity  To prevent constipation: you may use a stool softener such as -  Colace (over the counter) 100 mg by mouth twice a day  Drink plenty of fluids (prune juice may be helpful) and high fiber foods Miralax  (over the counter) for constipation as needed.    Itching:  If you experience itching with your medications, try taking only a single pain pill, or even half a pain pill at a time.  You may take up to 10 pain pills per day, and you can also use benadryl  over the counter for itching or also to help with sleep.   Precautions:  If you experience chest pain or shortness of breath - call 911 immediately for transfer to the hospital emergency department!!  If you develop a fever greater that 101 F, purulent drainage from wound, increased redness or drainage from wound, or calf pain-Call [facility]                                               Follow- Up Appointment:  Please call for an appointment to be seen in 2 weeks at [place] They will call you to set up when they will come to your home   [facility] Address: [address], [place], [place] [address] Hours:  Open ? Closes 4?PM Phone: [phone]

## 2024-01-01 NOTE — Plan of Care (Signed)

## 2024-01-01 NOTE — Progress Notes (Signed)
 PT Cancellation Note  Patient Details Name: KAMDEN REBER MRN: [id number] DOB: 05/10/1975   Cancelled Treatment:    Reason Eval/Treat Not Completed: Fatigue/lethargy limiting ability to participate  Pt with valium  at 11:40 to control spasms with plan to return to trial stairs for possible DC home today.  Pt very lethargic post Valium  and unable to stay awake or safe for stair training.  She would awake briefly but falls quickly back to sleep and does not recall some interactions with team, her lunch tray arriving or how home DC meds were at her bedside assuming pharmacy dropped them off.  Oh, there was a lady with a basket here.  Pt recently got up to bathroom with tech and while she reports spasms did seem to be better she is unsure if she just slept through them and now her leg feels heavy with gait.  She did not feel comfortable trying stairs this pm.  Will Message orhto team about response to Valium .  RN aware of her request for pain meds, and medications at bedside.  Will plan for PT session again tomorrow and will leave recommendations at SNF and update as appropriate.   Lauraine Gills 01/01/2024, 3:09 PM

## 2024-01-01 NOTE — Progress Notes (Signed)
 Physical Therapy Treatment Patient Details Name: Debbie Sosa MRN: [id number] DOB: [birth year] Today's Date: 01/01/2024   History of Present Illness Pt admitted to [facility] on 12/29/23 for elective I&D of LLE hematoma. Significant PMH includes: Rheumatoid arthritis, anemia with iron  deficiency and vitamin B12 deficiency, s/p gastric bypass, lymphedema, prior DVT/PE, lumbar radiculopathy, and recent L THA (9/24).    PT Comments  Pt is able to progress gait 82' today after using bathroom.  Continued to rely heavily on RW for support but generally steady.  She has 6 steps into home that she is hesitant to try today.  Dr. Lorelle in during session.  Pt with c/o cramping in LE.  Will try new meds to see if it helps.  Will return back this pm to see if she is ready to try stairs or if she feels better waiting until tomorrow.  Pt continues to be unsure of DC plan.  She does voice awareness that she has limited family support at home and will struggle at discharge but also that she does not want to be in rehab over the holidays.  Will continue to assess daily and update as appropriate.  Will leave at SNF at this time.   If plan is discharge home, recommend the following: A little help with bathing/dressing/bathroom;Assistance with cooking/housework;Help with stairs or ramp for entrance;Assist for transportation   Can travel by private vehicle     Yes  Equipment Recommendations  None recommended by PT    Recommendations for Other Services       Precautions / Restrictions Precautions Precautions: Fall Recall of Precautions/Restrictions: Intact Restrictions Weight Bearing Restrictions Per Provider Order: Yes LLE Weight Bearing Per Provider Order: Weight bearing as tolerated     Mobility  Bed Mobility Overal bed mobility: Needs Assistance Bed Mobility: Supine to Sit, Sit to Supine     Supine to sit: Contact guard, Supervision, Used rails, HOB elevated Sit to supine: Min assist, Used rails,  HOB elevated     Patient Response: Cooperative  Transfers Overall transfer level: Needs assistance Equipment used: Rolling walker (2 wheels) Transfers: Sit to/from Stand Sit to Stand: Contact guard assist                Ambulation/Gait Ambulation/Gait assistance: Supervision, Contact guard assist Gait Distance (Feet): 70 Feet Assistive device: Rolling walker (2 wheels) Gait Pattern/deviations: Step-to pattern, Step-through pattern, Antalgic, Decreased stance time - left, Decreased step length - right Gait velocity: decreased     General Gait Details: 25' 70'   Stairs         General stair comments: hesitant to try today   Wheelchair Mobility     Tilt Bed Tilt Bed Patient Response: Cooperative  Modified Rankin (Stroke Patients Only)       Balance Overall balance assessment: Mild deficits observed, not formally tested, Needs assistance Sitting-balance support: Feet supported Sitting balance-Leahy Scale: Good     Standing balance support: Bilateral upper extremity supported, During functional activity, No upper extremity supported Standing balance-Leahy Scale: Fair Standing balance comment: able to let go for self care and washing hands at sink                            Communication Communication Communication: No apparent difficulties  Cognition Arousal: Alert Behavior During Therapy: WFL for tasks assessed/performed   PT - Cognitive impairments: No apparent impairments  Following commands: Intact      Cueing Cueing Techniques: Verbal cues, Visual cues  Exercises      General Comments        Pertinent Vitals/Pain Pain Assessment Pain Assessment: Faces Faces Pain Scale: Hurts little more Pain Descriptors / Indicators: Sore, Constant Pain Intervention(s): Limited activity within patient's tolerance, Monitored during session, Premedicated before session, Repositioned    Home Living                           Prior Function            PT Goals (current goals can now be found in the care plan section) Progress towards PT goals: Progressing toward goals    Frequency    Min 3X/week      PT Plan      Co-evaluation              AM-PAC PT 6 Clicks Mobility   Outcome Measure  Help needed turning from your back to your side while in a flat bed without using bedrails?: None Help needed moving from lying on your back to sitting on the side of a flat bed without using bedrails?: A Little Help needed moving to and from a bed to a chair (including a wheelchair)?: A Little Help needed standing up from a chair using your arms (e.g., wheelchair or bedside chair)?: A Little Help needed to walk in hospital room?: A Little Help needed climbing 3-5 steps with a railing? : A Lot 6 Click Score: 18    End of Session Equipment Utilized During Treatment: Gait belt Activity Tolerance: Patient tolerated treatment well Patient left: with call bell/phone within reach;in bed Nurse Communication: Mobility status PT Visit Diagnosis: Difficulty in walking, not elsewhere classified (R26.2);Muscle weakness (generalized) (M62.81);Pain Pain - Right/Left: Left Pain - part of body: Leg     Time: 8981-8956 PT Time Calculation (min) (ACUTE ONLY): 25 min  Charges:    $Gait Training: 23-37 mins PT General Charges $$ ACUTE PT VISIT: 1 Visit                   Lauraine Gills, PTA 01/01/2024, 12:38 PM

## 2024-01-01 NOTE — Progress Notes (Signed)
°  Subjective: 3 Days Post-Op Procedures (LRB): INCISION AND DRAINAGE OF HEMATOMA, THIGH (Left) Patient reports pain as mild this morning.  Patient is well, and has had no acute complaints or problems Plan is to go Home after hospital stay.  Negative for chest pain and shortness of breath Fever: no Gastrointestinal:Negative for nausea and vomiting  Objective: Vital signs in last 24 hours: Temp:  [97.8 F (36.6 C)-98.4 F (36.9 C)] 97.8 F (36.6 C) (12/15 0746) Pulse Rate:  [65-82] 74 (12/15 0746) Resp:  [16] 16 (12/15 0746) BP: (90-126)/(64-71) 126/71 (12/15 0746) SpO2:  [99 %-100 %] 99 % (12/15 0746)  Intake/Output from previous day:  Intake/Output Summary (Last 24 hours) at 01/01/2024 1039 Last data filed at 12/31/2023 1900 Gross per 24 hour  Intake 240 ml  Output --  Net 240 ml    Intake/Output this shift: No intake/output data recorded.  Labs: Recent Labs    12/30/23 0522 12/31/23 0526  HGB 9.4* 8.9*   Recent Labs    12/30/23 0522 12/31/23 0526  WBC 11.8* 8.8  RBC 3.12* 2.96*  HCT 28.4* 27.6*  PLT 396 378   Recent Labs    12/30/23 0522  NA 136  K 4.3  CL 102  CO2 26  BUN 11  CREATININE 0.58  GLUCOSE 103*  CALCIUM 9.0   No results for input(s): LABPT, INR in the last 72 hours.   EXAM General - Patient is Alert, Appropriate, and Oriented Extremity - ABD soft Neurovascular intact Dorsiflexion/Plantar flexion intact No cellulitis present Compartment soft Dressing/Incision - Prevena intact without drainage.  Thigh is soft to palpation.   Motor Function - intact, moving foot and toes well on exam.   Past Medical History:  Diagnosis Date   Anemia    Antiphospholipid antibody positive    Arthritis    Asthma    seasonal   Chronic venous insufficiency    COVID-19 2020   DDD (degenerative disc disease), lumbar    GERD (gastroesophageal reflux disease)    Headache    h/o migraines   History of hiatal hernia    repaired with Gastric  bypass   History of kidney stones    Iron  deficiency    recieves iron  infusions every 3 months at the cancer center   Lymphedema    bil legs-sees [facility] V&V   Nausea and vomiting 10/14/2015   Obesity    Pneumonia    Pulmonary embolism (HCC)    after 2nd c-section   Sleep apnea    had gastric bypass and does not need to use cpap since surgery   Vitamin B12 deficiency     Assessment/Plan: 3 Days Post-Op Procedures (LRB): INCISION AND DRAINAGE OF HEMATOMA, THIGH (Left) Principal Problem:   Hematoma of left thigh, subsequent encounter  Estimated body mass index is 37.91 kg/m as calculated from the following:   Height as of this encounter: 5' 1 (1.549 m).   Weight as of this encounter: 91 kg. Advance diet Up with therapy Pain well-controlled. Vital signs are stable Muscle spasms noted, will add Valium . Continue with physical therapy today, if good progress may discharge home with home health PT  DVT Prophylaxis - Lovenox  and TED hose Weight-Bearing as tolerated to left leg  T. Medford Amber, PA-C [facility] 01/01/2024, 10:39 AM

## 2024-01-01 NOTE — Progress Notes (Signed)
 OT Cancellation Note  Patient Details Name: Debbie Sosa MRN: [id number] DOB: 11/26/1975   Cancelled Treatment:    Reason Eval/Treat Not Completed: Fatigue/lethargy limiting ability to participate. Upon attempt, pt very lethargic, notes recent dose of Valium . Pt unable to maintain alertness for session. Will re-attempt at later date/time as pt is appropriate.   [name] R., MPH, MS, OTR/L ascom [phone] 01/01/2024, 2:23 PM

## 2024-01-01 NOTE — TOC Progression Note (Signed)
 Transition of Care [facility]) - Progression Note    Patient Details  Name: Debbie Sosa MRN: [id number] Date of Birth: [birth year]  Transition of [facility]) CM/SW Contact  Daved JONETTA Hamilton, RN Phone Number: 01/01/2024, 11:24 AM  Clinical Narrative:     [facility] Eric declined insurance denied or not accepted. TOC will soft call patients other preferred facilities to obtain bed availability.     Barriers to Discharge: SNF Pending bed offer               Expected Discharge Plan and Services       Living arrangements for the past 2 months: Single Family Home                                       Social Drivers of Health (SDOH) Interventions SDOH Screenings   Food Insecurity: No Food Insecurity (12/30/2023)  Recent Concern: Food Insecurity - Food Insecurity Present (10/02/2023)   Received from [facility]  Housing: Low Risk (12/30/2023)  Recent Concern: Housing - High Risk (10/02/2023)   Received from [facility]  Transportation Needs: No Transportation Needs (12/30/2023)  Utilities: Not At Risk (12/30/2023)  Depression (PHQ2-9): Low Risk (11/28/2023)  Financial Resource Strain: Medium Risk (10/02/2023)   Received from [facility]  Social Connections: Socially Isolated (08/22/2023)  Tobacco Use: Low Risk (12/29/2023)    Readmission Risk Interventions     No data to display

## 2024-01-02 DIAGNOSIS — S7012XA Contusion of left thigh, initial encounter: Secondary | ICD-10-CM | POA: Diagnosis not present

## 2024-01-02 MED ORDER — DIAZEPAM 2 MG PO TABS: 2.0000 mg | ORAL_TABLET | Freq: Three times a day (TID) | ORAL | Status: DC | PRN

## 2024-01-02 MED ORDER — DIAZEPAM 2 MG PO TABS: 2.0000 mg | ORAL_TABLET | Freq: Three times a day (TID) | ORAL | 0 refills | Status: AC | PRN

## 2024-01-02 NOTE — Progress Notes (Signed)
 Physical Therapy Treatment Patient Details Name: Debbie Sosa MRN: [id number] DOB: Apr 01, 1975 Today's Date: 01/02/2024   History of Present Illness Pt admitted to [facility] on 12/29/23 for elective I&D of LLE hematoma. Significant PMH includes: Rheumatoid arthritis, anemia with iron  deficiency and vitamin B12 deficiency, s/p gastric bypass, lymphedema, prior DVT/PE, lumbar radiculopathy, and recent L THA (9/24).    PT Comments  Pt received in bed, wound vac to L thigh intact. Ace wrap adjusted on thigh and TED hose applied. Pt currently ModI for bed mobility utilizing bed rail to maneuver and increased time for L LE management. Transfers with Supervision at [facility] for support. Gait training WBAT L LE with heavy reliance on upper body to off load 8/10 thigh pain, ankle discomfort improved with TED hose applied. Overall doing well despite pain and limited mobility tolerance. Pt states if she is unable to go to STR, she is comfortable returning home with assist from family and HHPT. No DME needs at this time.    If plan is discharge home, recommend the following: A little help with bathing/dressing/bathroom;Assistance with cooking/housework;Help with stairs or ramp for entrance;Assist for transportation   Can travel by private vehicle     Yes  Equipment Recommendations  None recommended by PT    Recommendations for Other Services       Precautions / Restrictions Precautions Precautions: Fall Recall of Precautions/Restrictions: Intact Precaution/Restrictions Comments:  (L thigh Wound vac) Restrictions Weight Bearing Restrictions Per Provider Order: Yes LLE Weight Bearing Per Provider Order: Weight bearing as tolerated     Mobility  Bed Mobility Overal bed mobility: Needs Assistance Bed Mobility: Supine to Sit, Sit to Supine     Supine to sit: Modified independent (Device/Increase time), Used rails, HOB elevated Sit to supine: Min assist, Used rails, HOB elevated         Transfers Overall transfer level: Needs assistance Equipment used: Rolling walker (2 wheels) Transfers: Sit to/from Stand Sit to Stand: Supervision           General transfer comment:  (Able to stand from bed with heavy reliance on UE's and RW)    Ambulation/Gait Ambulation/Gait assistance: Supervision Gait Distance (Feet): 100 Feet Assistive device: Rolling walker (2 wheels) Gait Pattern/deviations: Step-to pattern, Step-through pattern, Antalgic, Decreased stance time - left, Decreased step length - right Gait velocity: decreased     General Gait Details:  (Heavy reliance on upper body to off load L LE pain, no LOB, steady slow gait)   Stairs         General stair comments:  (Discussed and reviewed stair negotiation. Pt able to recall proper sequence from recent hip surgery.)   Wheelchair Mobility     Tilt Bed    Modified Rankin (Stroke Patients Only)       Balance Overall balance assessment: Mild deficits observed, not formally tested, Needs assistance                                          Communication Communication Communication: No apparent difficulties  Cognition Arousal: Alert Behavior During Therapy: WFL for tasks assessed/performed   PT - Cognitive impairments: No apparent impairments                         Following commands: Intact      Cueing Cueing Techniques: Verbal cues, Visual cues  Exercises General  Exercises - Lower Extremity Ankle Circles/Pumps: AROM, Left, 15 reps, Supine Heel Slides: AAROM, Left, 5 reps, Supine Other Exercises Other Exercises: Pt edu re: PT role/POC, DC recommendations, IND for transfer to/from [facility] for toileting, LLE WB precautions, call for help, OOB for toileting/meals, mobility tech    General Comments General comments (skin integrity, edema, etc.):  (L thigh wound vaci intact, L thigh ace wrap adjusted and TED hose applied)      Pertinent Vitals/Pain Pain  Assessment Pain Assessment: 0-10 Pain Score: 8  Pain Location: L thigh, R post lower leg cramping Pain Descriptors / Indicators: Sore, Constant Pain Intervention(s): Patient requesting pain meds-RN notified    Home Living                          Prior Function            PT Goals (current goals can now be found in the care plan section) Acute Rehab PT Goals 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 Progress towards PT goals: Progressing toward goals    Frequency    Min 3X/week      PT Plan      Co-evaluation              AM-PAC PT 6 Clicks Mobility   Outcome Measure  Help needed turning from your back to your side while in a flat bed without using bedrails?: A Little Help needed moving from lying on your back to sitting on the side of a flat bed without using bedrails?: A Little Help needed moving to and from a bed to a chair (including a wheelchair)?: A Little Help needed standing up from a chair using your arms (e.g., wheelchair or bedside chair)?: A Little Help needed to walk in hospital room?: A Little Help needed climbing 3-5 steps with a railing? : A Little 6 Click Score: 18    End of Session   Activity Tolerance: Patient tolerated treatment well Patient left: with call bell/phone within reach;in bed Nurse Communication: Mobility status PT Visit Diagnosis: Difficulty in walking, not elsewhere classified (R26.2);Muscle weakness (generalized) (M62.81);Pain Pain - Right/Left: Left Pain - part of body: Leg     Time: 1111-1140 PT Time Calculation (min) (ACUTE ONLY): 29 min  Charges:    $Gait Training: 8-22 mins $Therapeutic Activity: 8-22 mins PT General Charges $$ ACUTE PT VISIT: 1 Visit                    Darice Bohr, PTA  Darice JAYSON Bohr 01/02/2024, 11:54 AM

## 2024-01-02 NOTE — Discharge Summary (Signed)
 Physician Discharge Summary  Patient ID: [name] MRN: [id number] DOB/AGE: 48/09/1975 48 y.o.  Admit date: 12/29/2023 Discharge date: 01/02/2024  Admission Diagnoses:  Thigh hematoma, left, initial encounter [S70.12XA] Hematoma of left thigh, subsequent encounter [S70.12XD]   Discharge Diagnoses: Patient Active Problem List   Diagnosis Date Noted   Hematoma of left thigh, subsequent encounter 12/29/2023   Myositis of left thigh 12/22/2023   Left leg pain 12/22/2023   Intramuscular hematoma, left thigh 12/21/2023   History of venous thromboembolism (DVT/PE) 12/21/2023   Multiple drug allergies 12/21/2023   History of pulmonary embolism 10/21/2023   S/P total left hip arthroplasty 09/21/2023   Lumbar radiculitis 08/23/2023   Left hip pain 08/22/2023   Osteoarthritis of left hip 06/21/2023   Prediabetes 02/06/2023   Muscle weakness 01/24/2023   Pain of left hip joint 01/24/2023   Hyperglycemia 01/25/2022   Arthritis of left foot 11/01/2021   Menorrhagia with regular cycle 09/17/2021   Retained intrauterine contraceptive device (IUD) 09/17/2021   Vitamin D  deficiency 09/01/2021   Lymphedema, chronic 03/07/2021   Chronic venous insufficiency 03/07/2021   Iron  deficiency anemia secondary to inadequate dietary iron  intake 02/05/2021   B12 deficiency 08/20/2020   Antiphospholipid antibody positive 01/27/2020   Iron  deficiency 01/27/2020   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   Rheumatoid arthritis involving multiple sites with positive rheumatoid factor (HCC) 09/19/2019   History of COVID-19 03/25/2019   Constipation 01/27/2019   Herpes zoster without complication 01/27/2019   Hypoxia 01/13/2019   Pneumonia due to COVID-19 virus 01/13/2019   Personal history of pulmonary embolism 01/13/2019   Bilateral leg pain 01/13/2019   Allergic rhinitis 12/04/2017   Acute laryngitis 08/17/2016   Dysphagia 08/17/2016   Meningitis 08/17/2016   Singers' nodes 08/17/2016    Diarrhea, unspecified 10/14/2015   Left lower quadrant pain 10/14/2015   Nausea and vomiting 10/14/2015   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   History of gastric bypass 07/23/2015   Central sleep apnea 01/07/2015   Obesity (BMI 30-39.9) 01/07/2015   GERD (gastroesophageal reflux disease) 12/25/2014   Pulmonary embolism (HCC)    Plantar fasciitis of right foot 12/31/2013    Past Medical History:  Diagnosis Date   Anemia    Antiphospholipid antibody positive    Arthritis    Asthma    seasonal   Chronic venous insufficiency    COVID-19 2020   DDD (degenerative disc disease), lumbar    GERD (gastroesophageal reflux disease)    Headache    h/o migraines   History of hiatal hernia    repaired with Gastric bypass   History of kidney stones    Iron  deficiency    recieves iron  infusions every 3 months at the cancer center   Lymphedema    bil legs-sees [facility]   Nausea and vomiting 10/14/2015   Obesity    Pneumonia    Pulmonary embolism (HCC)    after 2nd c-section   Sleep apnea    had gastric bypass and does not need to use cpap since surgery   Vitamin B12 deficiency      Transfusion: none   Consultants (if any):   Discharged Condition: Improved  Hospital Course: Debbie Sosa is an 48 y.o. female who was admitted 12/29/2023 with a diagnosis of Hematoma of left thigh, subsequent encounter and went to the operating room on 12/29/2023 and underwent the above named procedures.    Surgeries: Procedures: INCISION AND DRAINAGE OF HEMATOMA, THIGH on 12/29/2023 Patient tolerated the surgery well. Taken to  PACU where she was stabilized and then transferred to the orthopedic floor.  Post op day 1, patient started PT. Slow progress with PT. On post op day 2, hemovac removed. Patient made slow progress with PT. On post op day 4 patient stable and ready for discharge to home with HHPT.  She was given perioperative antibiotics:  Anti-infectives (From admission,  onward)    Start     Dose/Rate Route Frequency Ordered Stop   12/29/23 2330  vancomycin  (VANCOCIN ) IVPB 1000 mg/200 mL premix        1,000 mg 200 mL/hr over 60 Minutes Intravenous Every 12 hours 12/29/23 1938 12/29/23 2350   12/29/23 0900  gentamicin  (GARAMYCIN ) 330 mg in dextrose  5 % 100 mL IVPB        5 mg/kg  65.3 kg (Adjusted) 108.3 mL/hr over 60 Minutes Intravenous On call to O.R. 12/29/23 0857 12/29/23 2251   12/29/23 0900  vancomycin  (VANCOCIN ) IVPB 1000 mg/200 mL premix        1,000 mg 200 mL/hr over 60 Minutes Intravenous On call to O.R. 12/29/23 9142 12/29/23 2251     .  She was given sequential compression devices, early ambulation  She benefited maximally from the hospital stay and there were no complications.    Recent vital signs:  Vitals:   01/02/24 0407 01/02/24 0716  BP: 100/62 101/62  Pulse: 70 77  Resp: 17 16  Temp: 97.8 F (36.6 C) 98 F (36.7 C)  SpO2: 97% 96%    Recent laboratory studies:  Lab Results  Component Value Date   HGB 8.9 (L) 12/31/2023   HGB 9.4 (L) 12/30/2023   HGB 11.7 (L) 12/20/2023   Lab Results  Component Value Date   WBC 8.8 12/31/2023   PLT 378 12/31/2023   Lab Results  Component Value Date   INR 1.0 12/20/2023   Lab Results  Component Value Date   NA 136 12/30/2023   K 4.3 12/30/2023   CL 102 12/30/2023   CO2 26 12/30/2023   BUN 11 12/30/2023   CREATININE 0.58 12/30/2023   GLUCOSE 103 (H) 12/30/2023    Discharge Medications:   Allergies as of 01/02/2024       Reactions   Amoxicillin  Anaphylaxis   Throat swelling Sob, itching amoxicillin    Avocado Anaphylaxis   Cephalexin  Hives, Rash   Fexofenadine Shortness Of Breath   Iodinated Contrast Media Hives   Ketorolac  Hives   Morphine Anaphylaxis, Dermatitis   Penicillins Swelling   Throat swelling Product containing penicillin  (product)   Phenazopyridine Rash, Dermatitis, Other (See Comments)   phenazopyridine hydrochloride   Promethazine Hives    Diflucan  [fluconazole ] Rash   Cetirizine Other (See Comments)   Skin eruptures Patient denies   Diclofenac  Sodium Hives   Diclofenac  Sodium Hives   Doxycycline Other (See Comments)   Hepatitis B Vaccine Dermatitis   Latex    With condoms   Levofloxacin  Other (See Comments)   levofloxacin    Nsaids Other (See Comments)   Other Hives, Dermatitis   Pain medicine that starts with an L   Tramadol  Hives   Can take with benadryl    Hepatitis B Virus Vaccines Rash   Misc. Sulfonamide Containing Compounds Rash   Morphine And Codeine Rash   Naproxen Rash, Dermatitis   Oxycodone -acetaminophen  Itching   Pyridium [phenazopyridine Hcl] Rash   Sulfa Antibiotics Rash, Other (See Comments)   Sulfasalazine Rash, Dermatitis   Vioxx [rofecoxib] Rash        Medication List  STOP taking these medications    predniSONE  10 MG tablet Commonly known as: DELTASONE        TAKE these medications    Acetaminophen  Extra Strength 500 MG Tabs Take 2 tablets (1,000 mg total) by mouth every 8 (eight) hours.   albuterol  108 (90 Base) MCG/ACT inhaler Commonly known as: VENTOLIN  HFA Inhale 1-2 puffs INH Q4-6hr prn for chest tightness, cough, wheezing, SOB/DOE.   cyanocobalamin  1000 MCG tablet Take 1 tablet (1,000 mcg total) by mouth daily.   diazepam  2 MG tablet Commonly known as: VALIUM  Take 1 tablet (2 mg total) by mouth every 8 (eight) hours as needed for muscle spasms (muscle spasms).   docusate sodium  100 MG capsule Commonly known as: COLACE Take 1 capsule (100 mg total) by mouth 2 (two) times daily.   EPINEPHrine  0.3 mg/0.3 mL Soaj injection Commonly known as: EpiPen  2-Pak Inject 0.3 mLs (0.3 mg total) into the muscle as needed for anaphylaxis.   levonorgestrel  20 MCG/DAY Iud Commonly known as: MIRENA  by Intrauterine route.   methocarbamol  500 MG tablet Commonly known as: ROBAXIN  Take 1 tablet (500 mg total) by mouth every 6 (six) hours as needed for muscle spasms.    omeprazole  40 MG capsule Commonly known as: PRILOSEC Take 40 mg by mouth daily as needed.   ondansetron  4 MG tablet Commonly known as: ZOFRAN  Take 1 tablet (4 mg total) by mouth every 6 (six) hours as needed for nausea.   oxyCODONE  5 MG immediate release tablet Commonly known as: Oxy IR/ROXICODONE  Take 0.5-1 tablets (2.5-5 mg total) by mouth every 6 (six) hours as needed for severe pain (pain score 7-10).   vitamin D3 25 MCG tablet Commonly known as: CHOLECALCIFEROL  Take 1 tablet (1,000 Units total) by mouth daily.        Diagnostic Studies: MR FEMUR LEFT WO CONTRAST Result Date: 12/27/2023 CLINICAL DATA:  Left thigh pain and swelling. History of total left hip arthroplasty 3 months ago. EXAM: MR OF THE LEFT FEMUR WITHOUT CONTRAST TECHNIQUE: Multiplanar, multisequence MR imaging of the left thigh was performed. No intravenous contrast was administered. COMPARISON:  CT scan 12/20/2023 FINDINGS: Artifact associated with the left total hip arthroplasty. No obvious complicating features are identified. No significant bony findings. As demonstrated on the CT scan there is a very large hematoma associated with the anterior compartment of the thigh. This extends all the way down to just above the knee. There is significant mass effect the vastus lateralis muscle. No findings for quadriceps tendon rupture. The hematoma is partially liquified. Surgical scarring changes involving the anterior aspect of the left hip but no subcutaneous hematoma. IMPRESSION: 1. Very large hematoma (approximately 30 x 9 x 4.0 cm) associated with the anterior compartment of the thigh. This extends all the way down to just above the knee. There is significant mass effect on the vastus lateralis muscle. 2. Left total hip arthroplasty without obvious complicating features. 3. No significant bony findings. Electronically Signed   By: MYRTIS Stammer M.D.   On: 12/27/2023 20:42   MR LUMBAR SPINE WO CONTRAST Result Date:  12/27/2023 EXAM: MRI LUMBAR SPINE 12/27/2023 07:33:16 PM TECHNIQUE: Multiplanar multisequence MRI of the lumbar spine was performed without the administration of intravenous contrast. COMPARISON: Lumbar spine CT 12/20/2023 and MRI 08/08/2023. CLINICAL HISTORY: Left lumbar radiculopathy. Left thigh pain and swelling. History of left hip arthroplasty. FINDINGS: BONES AND ALIGNMENT: 5 lumbar type vertebrae. Normal alignment. Normal vertebral body heights. Bone marrow signal is unremarkable. SPINAL CORD: The conus medullaris terminates  at T12-L1 and is normal in signal. SOFT TISSUES: No paraspinal mass. DISC LEVELS: Disc desiccation from L3-L4 through L5-S1 with mild disc space narrowing at L3-L4 and L4-L5. T12-L1: Negative. L1-L2: Normal disc. Mild facet hypertrophy without stenosis. L2-L3: Mild disc bulging eccentric to the left and mild facet hypertrophy result in mild left neural foraminal stenosis without spinal stenosis, unchanged from the prior MRI. L3-L4: Disc bulging, a left foraminal disc protrusion, and mild facet and ligamentum flavum hypertrophy result in mild left lateral recess stenosis and mild right and moderate left neural foraminal stenosis without significant spinal stenosis, unchanged. L4-L5: Disc bulging and moderate facet hypertrophy without significant stenosis, unchanged. Small left foraminal annular fissure. L5-S1: Severe right and mild to moderate left facet arthrosis without disc herniation or stenosis, unchanged. IMPRESSION: 1. Unchanged lumbar disc degeneration, most notable at L3-4 where there is moderate left neural foraminal stenosis. 2. No spinal stenosis. Electronically signed by: Dasie Hamburg MD 12/27/2023 07:57 PM EST RP Workstation: [id number]   DG HIP UNILAT W OR W/O PELVIS 2-3 VIEWS LEFT Result Date: 12/21/2023 CLINICAL DATA:  Fluoro guided aspiration of left hip. History of total hip arthroplasty, left. EXAM: DG HIP (WITH OR WITHOUT PELVIS) 2-3V LEFT COMPARISON:  Radiograph  yesterday FINDINGS: Two fluoroscopic spot views of the left hip submitted. A needle projects over the femoral neck component of hip arthroplasty. I was not involved during this procedure. Fluoroscopy time 1 seconds. Dose 1.53 mGy. IMPRESSION: Fluoroscopic spot views during left hip aspiration. Electronically Signed   By: Andrea Gasman M.D.   On: 12/21/2023 17:17   DG C-Arm 1-60 Min-No Report Result Date: 12/21/2023 Fluoroscopy was utilized by the requesting physician.  No radiographic interpretation.   CT ANGIO LOWER EXT BILAT W &/OR WO CONTRAST Result Date: 12/21/2023 CLINICAL DATA:  AVM, thigh Briefly, reported history of LEFT hip arthroplasty 09/21/2023 and had completed 4 weeks of prophylactic Lovenox  in early October presented to hospital with left thigh sudden onset of pain while walking with difficulty weightbearing with numbness of the left calf and foot. CT scan of the femur showed large hematoma and edema in the left thigh and the anterior compartment musculature. EXAM: CTA OF THE LOWER BILATERAL EXTREMITY WITH CONTRAST TECHNIQUE: Multidetector CT imaging of the lower bilateral extremity was performed according to the standard protocol following intravenous contrast administration. RADIATION DOSE REDUCTION: This exam was performed according to the departmental dose-optimization program which includes automated exposure control, adjustment of the mA and/or kV according to patient size and/or use of iterative reconstruction technique. COMPARISON:  CT LEFT lower extremity, 12/20/2023. CONTRAST:  OMNIPAQUE  IOHEXOL  350 MG/ML SOLN FINDINGS: Suboptimal evaluation, with poor visualization of the proximal LEFT thigh secondary to streak and beam hardening artifact from hip arthroplasty. VASCULAR Patent BILATERAL distal common iliac internal and external iliac arteries, CFA, SFA, profunda, and popliteal arteries. Intact at least 2-vessel runoff to the ankles. No pseudoaneurysm or CTA evidence of active  extravasation at the proximal LEFT thigh. NONVASCULAR Pelvis: Imaged portions of the bowel are nondilated. IUD. Bladder is within normal limits. RIGHT lower extremity: No soft tissue abnormality within the imaged RIGHT lower extremity. Ligaments and tendons are suboptimally assessed by CT. LEFT lower extremity: Proximal LEFT lateral thigh incisional scar with underlying subcutaneous fat stranding, and similar edematous appearance of the anterior compartment musculature. No focal drainable collection. Punctate radiodense focus at the proximal LEFT thigh, present on noncontrast imaging, and likely postsurgical. Ligaments and tendons are suboptimally assessed by CT. Musculoskeletal: LEFT hip arthroplasty. No  acute osseous abnormality within the imaged pelvis or lower extremities. IMPRESSION: Suboptimal evaluation, within these constraints; 1. No pseudoaneurysm or CTA evidence of active extravasation at the proximal LEFT thigh. 2. Postsurgical changes of LEFT hip arthroplasty, with incisional scar at the thigh and underlying fat stranding with edema of the anterior compartment musculature. No focal drainable collection. Electronically Signed   By: Thom Hall M.D.   On: 12/21/2023 14:19   CT Lumbar Spine Wo Contrast Result Date: 12/21/2023 EXAM: CT OF THE LUMBAR SPINE WITHOUT CONTRAST 12/20/2023 11:53:51 PM TECHNIQUE: CT of the lumbar spine was performed without the administration of intravenous contrast. Multiplanar reformatted images are provided for review. Automated exposure control, iterative reconstruction, and/or weight based adjustment of the mA/kV was utilized to reduce the radiation dose to as low as reasonably achievable. COMPARISON: None available. CLINICAL HISTORY: Severe left leg pain, possible radiculopathy. FINDINGS: BONES AND ALIGNMENT: Normal vertebral body heights. No acute fracture or suspicious bone lesion. Normal alignment. DEGENERATIVE CHANGES: Intervertebral disc height is preserved. Moderate  facet arthrosis, left greater than right at L4-L5. Moderate left and severe right facet arthrosis at L5-S1. Broad-based disc bulge at L3-L4 and L4-L5 with associated laminar hypertrophy resulting in mild central canal stenosis within the subforaminal zone at L3-L4 and moderate central canal stenosis within the subforaminal zone at L4-L5. The spinal canal is otherwise widely patent. No significant neural foraminal narrowing. SOFT TISSUES: Surgical changes of gastric bypass were identified. A 3 mm nonobstructing calculus centered within the interpolar region of the right kidney. IMPRESSION: 1. Broad-based disc bulge at L3-L4 with associated laminar hypertrophy resulting in mild central canal stenosis within the subforaminal zone. 2. Broad-based disc bulge at L4-L5 with associated laminar hypertrophy resulting in moderate central canal stenosis within the subforaminal zone. 3. Severe right facet arthrosis at L5-S1, with moderate left facet arthrosis at L5-S1 and moderate facet arthrosis at L4-L5. Electronically signed by: Dorethia Molt MD 12/21/2023 12:05 AM EST RP Workstation: [id number]   CT FEMUR LEFT WO CONTRAST Result Date: 12/21/2023 EXAM: CT OF THE LEFT FEMUR, WITHOUT IV CONTRAST 12/20/2023 11:53:51 PM TECHNIQUE: Axial images were acquired through the left femur without IV contrast. Reformatted images were reviewed. Automated exposure control, iterative reconstruction, and/or weight based adjustment of the mA/kV was utilized to reduce the radiation dose to as low as reasonably achievable. COMPARISON: Comparison with same day x-ray and CT 08/22/2023. CLINICAL HISTORY: Severe left leg pain of unclear etiology, consider muscle hematoma, unlikely occult fracture. FINDINGS: BONES: No acute fracture or focal osseous lesion. Left hip arthroplasty without evidence of loosening. JOINTS: No dislocation. The joint spaces are normal. No knee joint effusion. SOFT TISSUES: Extensive heterogeneous edema and hyperdense fluid  within the anterior compartment musculature of the left thigh. The margins are not well defined but the hematoma measures approximately 6.3 x 4.6 x 24.7 cm. Assessment for active bleeding is impossible on noncontrast exam. If there is concern for active bleeding. Consider CTA of the left lower extremity. IMPRESSION: 1. Large hematoma and edema in the left thigh anterior compartment musculature. Clinical assessment for compartment syndrome is recommended. 2. If there is concern for active bleeding, consider CTA of the left lower extremity. Electronically signed by: Norman Gatlin MD 12/21/2023 12:05 AM EST RP Workstation: [id number]   US  Venous Img Lower Unilateral Left Result Date: 12/20/2023 CLINICAL DATA:  Left leg pain. EXAM: LEFT LOWER EXTREMITY VENOUS DOPPLER ULTRASOUND TECHNIQUE: Gray-scale sonography with graded compression, as well as color Doppler and duplex ultrasound were performed to evaluate the  lower extremity deep venous systems from the level of the common femoral vein and including the common femoral, femoral, profunda femoral, popliteal and calf veins including the posterior tibial, peroneal and gastrocnemius veins when visible. The superficial great saphenous vein was also interrogated. Spectral Doppler was utilized to evaluate flow at rest and with distal augmentation maneuvers in the common femoral, femoral and popliteal veins. COMPARISON:  July 08, 2023 FINDINGS: Contralateral Common Femoral Vein: Respiratory phasicity is normal and symmetric with the symptomatic side. No evidence of thrombus. Normal compressibility. Common Femoral Vein: No evidence of thrombus. Normal compressibility, respiratory phasicity and response to augmentation. Saphenofemoral Junction: No evidence of thrombus. Normal compressibility and flow on color Doppler imaging. Profunda Femoral Vein: No evidence of thrombus. Normal compressibility and flow on color Doppler imaging. Femoral Vein: No evidence of thrombus. Normal  compressibility, respiratory phasicity and response to augmentation. Popliteal Vein: No evidence of thrombus. Normal compressibility, respiratory phasicity and response to augmentation. Calf Veins: No evidence of thrombus. Normal compressibility and flow on color Doppler imaging. Superficial Great Saphenous Vein: No evidence of thrombus. Normal compressibility. Venous Reflux:  None. Other Findings:  None. IMPRESSION: No evidence of deep venous thrombosis within the LEFT lower extremity. Electronically Signed   By: Suzen Dials M.D.   On: 12/20/2023 23:41   DG Hip Unilat W or Wo Pelvis 2-3 Views Left Result Date: 12/20/2023 EXAM: 2 or more VIEW(S) XRAY OF THE PELVIS AND LEFT HIP 12/20/2023 07:44:25 PM COMPARISON: None available. CLINICAL HISTORY: L hip pain, left hip replacement about 3 months ago FINDINGS: BONES AND JOINTS: SI joints are symmetric. No acute fracture. Bilateral hips demonstrate normal alignment. Left hip arthroplasty noted. Intact and well seated hardware. Surgical clips overlie the right hemipelvis. SOFT TISSUES: IUD. IMPRESSION: 1. Left hip arthroplasty with intact and well-seated hardware. Electronically signed by: Norman Gatlin MD 12/20/2023 08:46 PM EST RP Workstation: [id number]   DG Femur Min 2 Views Left Result Date: 12/20/2023 EXAM: 2 VIEW(S) XRAY OF THE LEFT FEMUR 12/20/2023 07:44:25 PM COMPARISON: None available. CLINICAL HISTORY: L leg pain FINDINGS: BONES AND JOINTS: Total left hip arthroplasty in place. No acute fracture. SOFT TISSUES: The soft tissues are unremarkable. IMPRESSION: 1. No acute osseous abnormality. Electronically signed by: Norman Gatlin MD 12/20/2023 08:45 PM EST RP Workstation: [id number]    Disposition: Discharge disposition: 06-Home-Health Care Svc          Follow-up Information     Charlene Debby BROCKS, PA-C Follow up in 2 week(s).   Specialties: Orthopedic Surgery, Emergency Medicine Contact information: [address] [place]  [address] [phone]                  Signed: Debby BROCKS Charlene 01/02/2024, 12:14 PM

## 2024-01-02 NOTE — TOC Progression Note (Signed)
 Transition of Care [facility]) - Progression Note    Patient Details  Name: Debbie Sosa MRN: [id number] Date of Birth: [birth year]  Transition of Care [facility]) CM/SW Contact  Daved JONETTA Hamilton, RN Phone Number: 01/02/2024, 9:15 AM  Clinical Narrative:     [facility] declined due to no bed available. Soft call made to [facility], lvmm for Suzen to return my call. Will discuss with patient if she has any other choices.    Barriers to Discharge: SNF Pending bed offer               Expected Discharge Plan and Services       Living arrangements for the past 2 months: Single Family Home                                       Social Drivers of Health (SDOH) Interventions SDOH Screenings   Food Insecurity: No Food Insecurity (12/30/2023)  Recent Concern: Food Insecurity - Food Insecurity Present (10/02/2023)   Received from [facility]  Housing: Low Risk (12/30/2023)  Recent Concern: Housing - High Risk (10/02/2023)   Received from [facility]  Transportation Needs: No Transportation Needs (12/30/2023)  Utilities: Not At Risk (12/30/2023)  Depression (PHQ2-9): Low Risk (11/28/2023)  Financial Resource Strain: Medium Risk (10/02/2023)   Received from [facility]  Social Connections: Socially Isolated (08/22/2023)  Tobacco Use: Low Risk (12/29/2023)    Readmission Risk Interventions     No data to display

## 2024-01-02 NOTE — Plan of Care (Signed)

## 2024-01-02 NOTE — Progress Notes (Signed)
°  Subjective: 4 Days Post-Op Procedures (LRB): INCISION AND DRAINAGE OF HEMATOMA, THIGH (Left) Patient reports pain as mild this morning.  Patient is well, and has had no acute complaints or problems Plan is to go Home versus skilled nursing facility after hospital stay.  Negative for chest pain and shortness of breath Fever: no Gastrointestinal:Negative for nausea and vomiting  Objective: Vital signs in last 24 hours: Temp:  [97.8 F (36.6 C)-98.4 F (36.9 C)] 98 F (36.7 C) (12/16 0716) Pulse Rate:  [70-83] 77 (12/16 0716) Resp:  [16-18] 16 (12/16 0716) BP: (100-135)/(61-106) 101/62 (12/16 0716) SpO2:  [96 %-100 %] 96 % (12/16 0716)  Intake/Output from previous day:  Intake/Output Summary (Last 24 hours) at 01/02/2024 0754 Last data filed at 01/01/2024 1900 Gross per 24 hour  Intake 840 ml  Output --  Net 840 ml    Intake/Output this shift: No intake/output data recorded.  Labs: Recent Labs    12/31/23 0526  HGB 8.9*   Recent Labs    12/31/23 0526  WBC 8.8  RBC 2.96*  HCT 27.6*  PLT 378   No results for input(s): NA, K, CL, CO2, BUN, CREATININE, GLUCOSE, CALCIUM in the last 72 hours.  No results for input(s): LABPT, INR in the last 72 hours.   EXAM General - Patient is Alert, Appropriate, and Oriented Extremity - ABD soft Neurovascular intact Dorsiflexion/Plantar flexion intact No cellulitis present Compartment soft Dressing/Incision - Prevena intact without drainage.  Thigh is soft to palpation.   Motor Function - intact, moving foot and toes well on exam.   Past Medical History:  Diagnosis Date   Anemia    Antiphospholipid antibody positive    Arthritis    Asthma    seasonal   Chronic venous insufficiency    COVID-19 2020   DDD (degenerative disc disease), lumbar    GERD (gastroesophageal reflux disease)    Headache    h/o migraines   History of hiatal hernia    repaired with Gastric bypass   History of kidney stones     Iron  deficiency    recieves iron  infusions every 3 months at the cancer center   Lymphedema    bil legs-sees [place] [facility]   Nausea and vomiting 10/14/2015   Obesity    Pneumonia    Pulmonary embolism (HCC)    after 2nd c-section   Sleep apnea    had gastric bypass and does not need to use cpap since surgery   Vitamin B12 deficiency     Assessment/Plan: 4 Days Post-Op Procedures (LRB): INCISION AND DRAINAGE OF HEMATOMA, THIGH (Left) Principal Problem:   Hematoma of left thigh, subsequent encounter  Estimated body mass index is 37.91 kg/m as calculated from the following:   Height as of this encounter: 5' 1 (1.549 m).   Weight as of this encounter: 91 kg. Advance diet Up with therapy Pain well-controlled. Vital signs are stable Continued muscle spasms, Valium  helped yesterday but caused significant sedation.  Will decrease 5 mg Valium  to 2 mg and monitor patient today. Continue with physical therapy today, if good progress may discharge home with home health PT  DVT Prophylaxis - Lovenox  and TED hose Weight-Bearing as tolerated to left leg  T. Medford Amber, PA-C [facility] Orthopaedic Surgery 01/02/2024, 7:54 AM

## 2024-01-02 NOTE — TOC Progression Note (Signed)
 Transition of Care [facility]) - Progression Note    Patient Details  Name: Debbie Sosa MRN: [id number] Date of Birth: Aug 19, 1975  Transition of Care [facility]) CM/SW Contact  Daved JONETTA Hamilton, RN Phone Number: 01/02/2024, 12:22 PM  Clinical Narrative:     [facility] declined due to no bed available.   Met with patient to discuss discharge planning. Discussed with patient that the three SNF's she requested have declined, presented patient with choice list for consideration of additional SNF options, patient verbally declined and advised she wants to go home with Home Health instead however, not the one in [place], [place]. Notified care team of the above.   Sent HH referrals in [facility] and presented patient with list for [facility] choice. Patient chooses [facility]. Selected [facility] in the [place], notified Kelsey with [facility] of patient choice.  Notified care team of the above, was advised by provider patient will discharge today. Notified Larraine with [facility] of patient discharge and sent HH order in [place].     Barriers to Discharge: SNF Pending bed offer               Expected Discharge Plan and Services       Living arrangements for the past 2 months: Single Family Home Expected Discharge Date: 01/02/24                                     Social Drivers of Health (SDOH) Interventions SDOH Screenings   Food Insecurity: No Food Insecurity (12/30/2023)  Recent Concern: Food Insecurity - Food Insecurity Present (10/02/2023)   Received from [facility]  Housing: Low Risk (12/30/2023)  Recent Concern: Housing - High Risk (10/02/2023)   Received from [facility]  Transportation Needs: No Transportation Needs (12/30/2023)  Utilities: Not At Risk (12/30/2023)  Depression (PHQ2-9): Low Risk (11/28/2023)  Financial Resource Strain: Medium Risk (10/02/2023)   Received from [facility]  Social Connections: Socially Isolated  (08/22/2023)  Tobacco Use: Low Risk (12/29/2023)    Readmission Risk Interventions     No data to display

## 2024-01-02 NOTE — Plan of Care (Signed)
°  Problem: Education: Goal: Knowledge of General Education information will improve Description: Including pain rating scale, medication(s)/side effects and non-pharmacologic comfort measures Outcome: Progressing   Problem: Clinical Measurements: Goal: Diagnostic test results will improve Outcome: Progressing   Problem: Activity: Goal: Risk for activity intolerance will decrease Outcome: Progressing   Problem: Coping: Goal: Level of anxiety will decrease Outcome: Progressing   Problem: Elimination: Goal: Will not experience complications related to bowel motility Outcome: Progressing   Problem: Pain Managment: Goal: General experience of comfort will improve and/or be controlled Outcome: Progressing   Problem: Activity: Goal: Ability to tolerate increased activity will improve Outcome: Progressing

## 2024-01-02 NOTE — TOC Transition Note (Signed)
 Transition of Care [facility]) - Discharge Note   Patient Details  Name: Debbie Sosa MRN: [id number] Date of Birth: 1975/08/24  Transition [facility]) CM/SW Contact:  Daved JONETTA Hamilton, RN Phone Number: 01/02/2024, 1:51 PM   Clinical Narrative:     Patient will DC to: Home with [facility] HH Anticipated DC date: 01/02/2024 Family notified: Patient notified her mother Wilder Amodei Transport by: Erminio Gains  Per MD patient ready for DC to Home with [facility]. RN, patient, patient's family, and facility notified of DC. Home Health order sent to facility.    TOC signing off.   Final next level of care: Home w Home Health Services Barriers to Discharge: Barriers Resolved   Patient Goals and CMS Choice Patient states their goals for this hospitalization and ongoing recovery are:: SNF   Choice offered to / list presented to : Patient      Discharge Placement                Patient to be transferred to facility by: Mother Name of family member notified: Irais Mottram mother Patient and family notified of of transfer: 01/02/24  Discharge Plan and Services Additional resources added to the After Visit Summary for                            [facility] Arranged: PT, OT [facility] Agency: [facility] Date [facility] Agency Contacted: 01/02/24 Time HH Agency Contacted: [phone] Representative spoke with at [facility] Agency: Larraine  Social Drivers of Health (SDOH) Interventions SDOH Screenings   Food Insecurity: No Food Insecurity (12/30/2023)  Recent Concern: Food Insecurity - Food Insecurity Present (10/02/2023)   Received from [facility]  Housing: Low Risk (12/30/2023)  Recent Concern: Housing - High Risk (10/02/2023)   Received from [facility]  Transportation Needs: No Transportation Needs (12/30/2023)  Utilities: Not At Risk (12/30/2023)  Depression (PHQ2-9): Low Risk (11/28/2023)  Financial Resource Strain: Medium Risk (10/02/2023)   Received from [facility]  Social Connections: Socially Isolated (08/22/2023)  Tobacco Use: Low Risk (12/29/2023)     Readmission Risk Interventions     No data to display

## 2024-01-19 ENCOUNTER — Ambulatory Visit
Admission: RE | Admit: 2024-01-19 | Discharge: 2024-01-19 | Disposition: A | Discharge status: Home / Self Care | Source: Ambulatory Visit

## 2024-01-19 ENCOUNTER — Ambulatory Visit

## 2024-01-19 DIAGNOSIS — Z1231 Encounter for screening mammogram for malignant neoplasm of breast: Secondary | ICD-10-CM | POA: Insufficient documentation

## 2024-01-25 ENCOUNTER — Encounter: Payer: Self-pay | Admitting: Gastroenterology

## 2024-01-25 ENCOUNTER — Ambulatory Visit
Admission: RE | Admit: 2024-01-25 | Discharge: 2024-01-25 | Disposition: A | Discharge status: Home / Self Care | Attending: Gastroenterology | Admitting: Gastroenterology

## 2024-01-25 ENCOUNTER — Other Ambulatory Visit: Payer: Self-pay

## 2024-01-25 ENCOUNTER — Ambulatory Visit: Payer: Self-pay | Admitting: Certified Registered"

## 2024-01-25 ENCOUNTER — Encounter
Admission: RE | Disposition: A | Discharge status: Home / Self Care | Payer: Self-pay | Source: Home / Self Care | Attending: Gastroenterology

## 2024-01-25 DIAGNOSIS — G4730 Sleep apnea, unspecified: Secondary | ICD-10-CM | POA: Insufficient documentation

## 2024-01-25 DIAGNOSIS — R1310 Dysphagia, unspecified: Secondary | ICD-10-CM | POA: Insufficient documentation

## 2024-01-25 DIAGNOSIS — Z604 Social exclusion and rejection: Secondary | ICD-10-CM | POA: Diagnosis not present

## 2024-01-25 DIAGNOSIS — J45909 Unspecified asthma, uncomplicated: Secondary | ICD-10-CM | POA: Insufficient documentation

## 2024-01-25 DIAGNOSIS — Z9884 Bariatric surgery status: Secondary | ICD-10-CM | POA: Insufficient documentation

## 2024-01-25 DIAGNOSIS — Z5941 Food insecurity: Secondary | ICD-10-CM | POA: Diagnosis not present

## 2024-01-25 DIAGNOSIS — Z980 Intestinal bypass and anastomosis status: Secondary | ICD-10-CM | POA: Insufficient documentation

## 2024-01-25 DIAGNOSIS — K219 Gastro-esophageal reflux disease without esophagitis: Secondary | ICD-10-CM | POA: Insufficient documentation

## 2024-01-25 DIAGNOSIS — K449 Diaphragmatic hernia without obstruction or gangrene: Secondary | ICD-10-CM | POA: Insufficient documentation

## 2024-01-25 DIAGNOSIS — Z59868 Other specified financial insecurity: Secondary | ICD-10-CM | POA: Insufficient documentation

## 2024-01-25 HISTORY — PX: ESOPHAGOGASTRODUODENOSCOPY: SHX5428

## 2024-01-25 HISTORY — PX: BIOPSY OF SKIN SUBCUTANEOUS TISSUE AND/OR MUCOUS MEMBRANE: SHX6741

## 2024-01-25 SURGERY — EGD (ESOPHAGOGASTRODUODENOSCOPY)
Anesthesia: General

## 2024-01-25 MED ORDER — PROPOFOL 10 MG/ML IV BOLUS
INTRAVENOUS | Status: DC | PRN
  Administered 2024-01-25: 100 mg via INTRAVENOUS

## 2024-01-25 MED ORDER — FENTANYL CITRATE (PF) 100 MCG/2ML IJ SOLN
INTRAMUSCULAR | Status: AC
  Filled 2024-01-25: qty 2

## 2024-01-25 MED ORDER — STERILE WATER FOR IRRIGATION IR SOLN
Status: DC | PRN
  Administered 2024-01-25: 60 mL

## 2024-01-25 MED ORDER — LIDOCAINE HCL (CARDIAC) PF 50 MG/5ML IV SOSY
PREFILLED_SYRINGE | INTRAVENOUS | Status: DC | PRN
  Administered 2024-01-25: 100 mg via INTRAVENOUS

## 2024-01-25 MED ORDER — PROPOFOL 1000 MG/100ML IV EMUL
INTRAVENOUS | Status: AC
  Filled 2024-01-25: qty 100

## 2024-01-25 MED ORDER — SODIUM CHLORIDE 0.9 % IV SOLN: INTRAVENOUS | Status: DC

## 2024-01-25 MED ORDER — LIDOCAINE HCL (PF) 2 % IJ SOLN
INTRAMUSCULAR | Status: AC
  Filled 2024-01-25: qty 5

## 2024-01-25 MED ORDER — FENTANYL CITRATE (PF) 100 MCG/2ML IJ SOLN
INTRAMUSCULAR | Status: DC | PRN
  Administered 2024-01-25: 50 ug via INTRAVENOUS

## 2024-01-25 NOTE — Anesthesia Preprocedure Evaluation (Signed)
 "                                  Anesthesia Evaluation  Patient identified by MRN, date of birth, ID band Patient awake    Reviewed: Allergy & Precautions, NPO status , Patient's Chart, lab work & pertinent test results  History of Anesthesia Complications Negative for: history of anesthetic complications  Airway Mallampati: II  TM Distance: >3 FB Neck ROM: Full    Dental no notable dental hx. (+) Chipped   Pulmonary asthma , sleep apnea    Pulmonary exam normal breath sounds clear to auscultation       Cardiovascular negative cardio ROS Normal cardiovascular exam Rhythm:Regular Rate:Normal  ECG 09/08/23: normal   Neuro/Psych  Headaches  negative psych ROS   GI/Hepatic Neg liver ROS, hiatal hernia,GERD  ,,S/p gastric bypass   Endo/Other  negative endocrine ROS  Obesity   Renal/GU negative Renal ROS  negative genitourinary   Musculoskeletal   Abdominal   Peds  Hematology  (+) Blood dyscrasia, anemia Anti-Pl antibody syndrome s/p PE    Anesthesia Other Findings Past Medical History: No date: Anemia No date: Antiphospholipid antibody positive No date: Arthritis No date: Asthma     Comment:  seasonal No date: Chronic venous insufficiency 2020: COVID-19 No date: DDD (degenerative disc disease), lumbar No date: GERD (gastroesophageal reflux disease) No date: Headache     Comment:  h/o migraines No date: History of hiatal hernia     Comment:  repaired with Gastric bypass No date: History of kidney stones No date: Iron  deficiency     Comment:  recieves iron  infusions every 3 months at the cancer               center No date: Lymphedema     Comment:  bil legs-sees [place] V&V 10/14/2015: Nausea and vomiting No date: Obesity No date: Pneumonia No date: Pulmonary embolism (HCC)     Comment:  after 2nd c-section No date: Sleep apnea     Comment:  had gastric bypass and does not need to use cpap since               surgery No date: Vitamin  B12 deficiency  Past Surgical History: 12/21/2023: ANTERIOR HIP REVISION; Left     Comment:  Procedure: LEFT HIP EXAMINATION UNDER ANESTHESIA WITH               ATTEMPTED FLUOROSCOPIC GUIDED ASPIRATION, LEFT THIGH               ULTRASOUND;  Surgeon: Lorelle Hussar, MD;  Location:               [facility] ORS;  Service: Orthopedics;  Laterality: Left; 2006: BREAST BIOPSY; Left     Comment:  benign/clip 2015: BREAST BIOPSY; Right     Comment:  benign/clip No date: CESAREAN SECTION No date: CESAREAN SECTION No date: CHOLECYSTECTOMY No date: COLONOSCOPY 10/31/2018: COLONOSCOPY WITH PROPOFOL ; N/A     Comment:  Procedure: COLONOSCOPY WITH PROPOFOL ;  Surgeon: Therisa Bi, MD;  Location: [facility] ENDOSCOPY;  Service:               Gastroenterology;  Laterality: N/A; 10/04/2016: ESOPHAGOGASTRODUODENOSCOPY (EGD) WITH PROPOFOL ; N/A     Comment:  Procedure: ESOPHAGOGASTRODUODENOSCOPY (EGD) WITH  PROPOFOL ;  Surgeon: Therisa Bi, MD;  Location: [facility]               ENDOSCOPY;  Service: Gastroenterology;  Laterality: N/A; 04/14/2017: ESOPHAGOGASTRODUODENOSCOPY (EGD) WITH PROPOFOL ; N/A     Comment:  Procedure: ESOPHAGOGASTRODUODENOSCOPY (EGD) WITH               PROPOFOL ;  Surgeon: Therisa Bi, MD;  Location: [facility]               ENDOSCOPY;  Service: Gastroenterology;  Laterality: N/A; 10/31/2018: ESOPHAGOGASTRODUODENOSCOPY (EGD) WITH PROPOFOL ; N/A     Comment:  Procedure: ESOPHAGOGASTRODUODENOSCOPY (EGD) WITH               PROPOFOL ;  Surgeon: Therisa Bi, MD;  Location: [facility]               ENDOSCOPY;  Service: Gastroenterology;  Laterality: N/A; 06/28/2019: ESOPHAGOGASTRODUODENOSCOPY (EGD) WITH PROPOFOL ; N/A     Comment:  Procedure: ESOPHAGOGASTRODUODENOSCOPY (EGD) WITH               PROPOFOL ;  Surgeon: Therisa Bi, MD;  Location: [facility]               ENDOSCOPY;  Service: Gastroenterology;  Laterality: N/A; 02/10/2021: ESOPHAGOGASTRODUODENOSCOPY (EGD) WITH PROPOFOL ; N/A     Comment:   Procedure: ESOPHAGOGASTRODUODENOSCOPY (EGD) WITH               PROPOFOL ;  Surgeon: Therisa Bi, MD;  Location: [facility]               ENDOSCOPY;  Service: Gastroenterology;  Laterality: N/A; No date: GASTRIC BYPASS     Comment:  01/2015 No date: HERNIA REPAIR     Comment:  repaired with gastric bypass - Hiatel hernia 09/17/2021: HYSTEROSCOPY WITH D & C; N/A     Comment:  Procedure: DILATATION AND CURETTAGE /HYSTEROSCOPY;                Surgeon: Leonce Garnette BIRCH, MD;  Location: [facility] ORS;                Service: Gynecology;  Laterality: N/A; 12/29/2023: INCISION AND DRAINAGE OF HEMATOMA, THIGH; Left     Comment:  Procedure: INCISION AND DRAINAGE OF HEMATOMA, THIGH;                Surgeon: Lorelle Hussar, MD;  Location: [facility] ORS;                Service: Orthopedics;  Laterality: Left; 09/17/2021: INTRAUTERINE DEVICE (IUD) INSERTION; N/A     Comment:  Procedure: INTRAUTERINE DEVICE (IUD) INSERTION - MIRENA ;              Surgeon: Leonce Garnette BIRCH, MD;  Location: [facility] ORS;                Service: Gynecology;  Laterality: N/A; 09/17/2021: IUD REMOVAL; N/A     Comment:  Procedure: INTRAUTERINE DEVICE (IUD) REMOVAL;  Surgeon:               Leonce Garnette BIRCH, MD;  Location: [facility] ORS;  Service:               Gynecology;  Laterality: N/A; 09/19/2023: IVC FILTER INSERTION; N/A     Comment:  Procedure: IVC FILTER INSERTION;  Surgeon: Jama Cordella MATSU, MD;  Location: [facility] INVASIVE CV LAB;  Service:  Cardiovascular;  Laterality: N/A; 11/07/2023: IVC FILTER REMOVAL; N/A     Comment:  Procedure: IVC FILTER REMOVAL;  Surgeon: Jama Cordella MATSU, MD;  Location: [facility] INVASIVE CV LAB;  Service:              Cardiovascular;  Laterality: N/A; No date: KNEE SURGERY; Right No date: NASAL SINUS SURGERY No date: PLANTAR FASCIA SURGERY     Comment:  x 3 2005: REDUCTION MAMMAPLASTY; Bilateral 09/21/2023: TOTAL HIP ARTHROPLASTY; Left     Comment:  Procedure:  ARTHROPLASTY, HIP, TOTAL, ANTERIOR APPROACH;               Surgeon: Lorelle Hussar, MD;  Location: [facility] ORS;                Service: Orthopedics;  Laterality: Left;  BMI    Body Mass Index: 37.98 kg/m      Reproductive/Obstetrics negative OB ROS                              Anesthesia Physical Anesthesia Plan  ASA: 2  Anesthesia Plan: General   Post-op Pain Management: Minimal or no pain anticipated   Induction: Intravenous  PONV Risk Score and Plan: 2 and Propofol  infusion and TIVA  Airway Management Planned: Nasal Cannula  Additional Equipment: None  Intra-op Plan:   Post-operative Plan:   Informed Consent: I have reviewed the patients History and Physical, chart, labs and discussed the procedure including the risks, benefits and alternatives for the proposed anesthesia with the patient or authorized representative who has indicated his/her understanding and acceptance.     Dental advisory given  Plan Discussed with: CRNA and Surgeon  Anesthesia Plan Comments: (Discussed risks of anesthesia with patient, including possibility of difficulty with spontaneous ventilation under anesthesia necessitating airway intervention, PONV, and rare risks such as cardiac or respiratory or neurological events, and allergic reactions. Discussed the role of CRNA in patient's perioperative care. Patient understands.)        Anesthesia Quick Evaluation  "

## 2024-01-25 NOTE — Anesthesia Postprocedure Evaluation (Signed)
"   Anesthesia Post Note  Patient: Debbie Sosa  Procedure(s) Performed: EGD (ESOPHAGOGASTRODUODENOSCOPY)  Patient location during evaluation: PACU Anesthesia Type: General Level of consciousness: awake and alert Pain management: pain level controlled Vital Signs Assessment: post-procedure vital signs reviewed and stable Respiratory status: spontaneous breathing, nonlabored ventilation, respiratory function stable and patient connected to nasal cannula oxygen Cardiovascular status: blood pressure returned to baseline and stable Postop Assessment: no apparent nausea or vomiting Anesthetic complications: no   No notable events documented.   Last Vitals:  Vitals:   01/25/24 0831 01/25/24 0842  BP: 103/70 111/68  Pulse: 78 72  Resp: 19 15  Temp:    SpO2: 99% 100%    Last Pain:  Vitals:   01/25/24 0842  TempSrc:   PainSc: 0-No pain                 [name]      "

## 2024-01-25 NOTE — Transfer of Care (Signed)
 Immediate Anesthesia Transfer of Care Note  Patient: Debbie Sosa  Procedure(s) Performed: EGD (ESOPHAGOGASTRODUODENOSCOPY)  Patient Location: PACU  Anesthesia Type:General  Level of Consciousness: awake, alert , and oriented  Airway & Oxygen Therapy: Patient Spontanous Breathing and Patient connected to face mask oxygen  Post-op Assessment: Report given to RN and Post -op Vital signs reviewed and stable  Post vital signs: stable  Last Vitals:  Vitals Value Taken Time  BP 118/85 01/25/24 08:22  Temp 35.8 C 01/25/24 08:22  Pulse 83 01/25/24 08:22  Resp 18 01/25/24 08:22  SpO2 98 % 01/25/24 08:22    Last Pain:  Vitals:   01/25/24 0822  TempSrc: Tympanic  PainSc: 0-No pain         Complications: No notable events documented.

## 2024-01-25 NOTE — H&P (Signed)
 "  Ruel Kung , MD [address], [address], [place], [place], [address] Phone: [phone] Fax: [phone]  Primary Care Physician:  Auston Reyes BIRCH, MD   Pre-Procedure History & Physical: HPI:  Debbie Sosa is a 49 y.o. female is here for an endoscopy    Past Medical History:  Diagnosis Date   Anemia    Antiphospholipid antibody positive    Arthritis    Asthma    seasonal   Chronic venous insufficiency    COVID-19 2020   DDD (degenerative disc disease), lumbar    GERD (gastroesophageal reflux disease)    Headache    h/o migraines   History of hiatal hernia    repaired with Gastric bypass   History of kidney stones    Iron  deficiency    recieves iron  infusions every 3 months at the cancer center   Lymphedema    bil legs-sees [facility]   Nausea and vomiting 10/14/2015   Obesity    Pneumonia    Pulmonary embolism (HCC)    after 2nd c-section   Sleep apnea    had gastric bypass and does not need to use cpap since surgery   Vitamin B12 deficiency     Past Surgical History:  Procedure Laterality Date   ANTERIOR HIP REVISION Left 12/21/2023   Procedure: LEFT HIP EXAMINATION UNDER ANESTHESIA WITH ATTEMPTED FLUOROSCOPIC GUIDED ASPIRATION, LEFT THIGH ULTRASOUND;  Surgeon: Lorelle Hussar, MD;  Location: [facility] ORS;  Service: Orthopedics;  Laterality: Left;   BREAST BIOPSY Left 2006   benign/clip   BREAST BIOPSY Right 2015   benign/clip   CESAREAN SECTION     CESAREAN SECTION     CHOLECYSTECTOMY     COLONOSCOPY     COLONOSCOPY WITH PROPOFOL  N/A 10/31/2018   Procedure: COLONOSCOPY WITH PROPOFOL ;  Surgeon: Kung Ruel, MD;  Location: [facility] ENDOSCOPY;  Service: Gastroenterology;  Laterality: N/A;   ESOPHAGOGASTRODUODENOSCOPY (EGD) WITH PROPOFOL  N/A 10/04/2016   Procedure: ESOPHAGOGASTRODUODENOSCOPY (EGD) WITH PROPOFOL ;  Surgeon: Kung Ruel, MD;  Location: [facility] ENDOSCOPY;  Service: Gastroenterology;  Laterality: N/A;   ESOPHAGOGASTRODUODENOSCOPY (EGD) WITH  PROPOFOL  N/A 04/14/2017   Procedure: ESOPHAGOGASTRODUODENOSCOPY (EGD) WITH PROPOFOL ;  Surgeon: Kung Ruel, MD;  Location: [facility] ENDOSCOPY;  Service: Gastroenterology;  Laterality: N/A;   ESOPHAGOGASTRODUODENOSCOPY (EGD) WITH PROPOFOL  N/A 10/31/2018   Procedure: ESOPHAGOGASTRODUODENOSCOPY (EGD) WITH PROPOFOL ;  Surgeon: Kung Ruel, MD;  Location: [facility] ENDOSCOPY;  Service: Gastroenterology;  Laterality: N/A;   ESOPHAGOGASTRODUODENOSCOPY (EGD) WITH PROPOFOL  N/A 06/28/2019   Procedure: ESOPHAGOGASTRODUODENOSCOPY (EGD) WITH PROPOFOL ;  Surgeon: Kung Ruel, MD;  Location: [facility] ENDOSCOPY;  Service: Gastroenterology;  Laterality: N/A;   ESOPHAGOGASTRODUODENOSCOPY (EGD) WITH PROPOFOL  N/A 02/10/2021   Procedure: ESOPHAGOGASTRODUODENOSCOPY (EGD) WITH PROPOFOL ;  Surgeon: Kung Ruel, MD;  Location: [facility] ENDOSCOPY;  Service: Gastroenterology;  Laterality: N/A;   GASTRIC BYPASS     01/2015   HERNIA REPAIR     repaired with gastric bypass - Hiatel hernia   HYSTEROSCOPY WITH D & C N/A 09/17/2021   Procedure: DILATATION AND CURETTAGE /HYSTEROSCOPY;  Surgeon: Leonce Garnette BIRCH, MD;  Location: [facility] ORS;  Service: Gynecology;  Laterality: N/A;   INCISION AND DRAINAGE OF HEMATOMA, THIGH Left 12/29/2023   Procedure: INCISION AND DRAINAGE OF HEMATOMA, THIGH;  Surgeon: Lorelle Hussar, MD;  Location: [facility] ORS;  Service: Orthopedics;  Laterality: Left;   INTRAUTERINE DEVICE (IUD) INSERTION N/A 09/17/2021   Procedure: INTRAUTERINE DEVICE (IUD) INSERTION - MIRENA ;  Surgeon: Leonce Garnette BIRCH, MD;  Location: [facility] ORS;  Service: Gynecology;  Laterality: N/A;  IUD REMOVAL N/A 09/17/2021   Procedure: INTRAUTERINE DEVICE (IUD) REMOVAL;  Surgeon: Leonce Garnette BIRCH, MD;  Location: [facility];  Service: Gynecology;  Laterality: N/A;   IVC FILTER INSERTION N/A 09/19/2023   Procedure: IVC FILTER INSERTION;  Surgeon: Jama Cordella MATSU, MD;  Location: [facility] INVASIVE CV LAB;  Service: Cardiovascular;  Laterality: N/A;   IVC FILTER  REMOVAL N/A 11/07/2023   Procedure: IVC FILTER REMOVAL;  Surgeon: Jama Cordella MATSU, MD;  Location: [facility] INVAS[facility] CV LAB;  Service: Cardiovascular;  Laterality: N/A;   KNEE SURGERY Right    NASAL SINUS SURGERY     PLANTAR FASCIA SURGERY     x 3   REDUCTION MAMMAPLASTY Bilateral 2005   TOTAL HIP ARTHROPLASTY Left 09/21/2023   Procedure: ARTHROPLASTY, HIP, TOTAL, ANTERIOR APPROACH;  Surgeon: Lorelle Hussar, MD;  Location: [facility];  Service: Orthopedics;  Laterality: Left;    Prior to Admission medications  Medication Sig Start Date End Date Taking? Authorizing Provider  acetaminophen  (TYLENOL ) 500 MG tablet Take 2 tablets (1,000 mg total) by mouth every 8 (eight) hours. 01/01/24   Charlene Debby BROCKS, PA-C  albuterol  (VENTOLIN  HFA) 108 (90 Base) MCG/ACT inhaler Inhale 1-2 puffs INH Q4-6hr prn for chest tightness, cough, wheezing, SOB/DOE. Patient not taking: Reported on 12/21/2023 01/10/19   Christopher Savannah, PA-C  cholecalciferol  (CHOLECALCIFEROL ) 25 MCG tablet Take 1 tablet (1,000 Units total) by mouth daily. 12/24/23   Patel, Sona, MD  cyanocobalamin  1000 MCG tablet Take 1 tablet (1,000 mcg total) by mouth daily. 12/24/23   Patel, Sona, MD  diazepam  (VALIUM ) 2 MG tablet Take 1 tablet (2 mg total) by mouth every 8 (eight) hours as needed for muscle spasms (muscle spasms). 01/02/24   Charlene Debby BROCKS, PA-C  docusate sodium  (COLACE) 100 MG capsule Take 1 capsule (100 mg total) by mouth 2 (two) times daily. 01/01/24   Charlene Debby BROCKS, PA-C  EPINEPHrine  (EPIPEN  2-PAK) 0.3 mg/0.3 mL IJ SOAJ injection Inject 0.3 mLs (0.3 mg total) into the muscle as needed for anaphylaxis. 04/16/19   Desai, Nikita S, MD  levonorgestrel  (MIRENA ) 20 MCG/DAY IUD by Intrauterine route.    [provider]  methocarbamol  (ROBAXIN ) 500 MG tablet Take 1 tablet (500 mg total) by mouth every 6 (six) hours as needed for muscle spasms. 12/23/23   Patel, Sona, MD  omeprazole  (PRILOSEC) 40 MG capsule Take 40 mg by mouth daily as  needed. 11/28/23   [provider]  ondansetron  (ZOFRAN ) 4 MG tablet Take 1 tablet (4 mg total) by mouth every 6 (six) hours as needed for nausea. 01/01/24   Charlene Debby BROCKS, PA-C  oxyCODONE  (OXY IR/ROXICODONE ) 5 MG immediate release tablet Take 0.5-1 tablets (2.5-5 mg total) by mouth every 6 (six) hours as needed for severe pain (pain score 7-10). 01/01/24   Charlene Debby BROCKS, PA-C  pantoprazole  (PROTONIX ) 40 MG tablet Take 1 tablet (40 mg total) by mouth daily. 04/16/19 12/16/19  Meade Verdon RAMAN, MD    Allergies as of 01/23/2024 - Review Complete 12/29/2023  Allergen Reaction Noted   Amoxicillin  Anaphylaxis 05/17/2023   Avocado Anaphylaxis 09/08/2021   Cephalexin  Hives and Rash 03/12/2014   Fexofenadine Shortness Of Breath    Iodinated contrast media Hives 05/23/2016   Ketorolac  Hives 03/12/2014   Morphine Anaphylaxis and Dermatitis 07/01/2013   Penicillins Swelling 05/15/2023   Phenazopyridine Rash, Dermatitis, and Other (See Comments) 06/18/2014   Promethazine Hives 03/12/2014   Diflucan  [fluconazole ] Rash 02/17/2021   Cetirizine Other (See Comments) 03/05/2018   Diclofenac   sodium Hives 04/15/2021   Diclofenac  sodium Hives 04/15/2021   Doxycycline Other (See Comments) 08/13/2021   Hepatitis b vaccine Dermatitis 07/01/2013   Latex  06/18/2014   Levofloxacin  Other (See Comments) 08/13/2021   Nsaids Other (See Comments) 01/27/2019   Other Hives and Dermatitis 02/23/2013   Tramadol  Hives 06/18/2014   Hepatitis b virus vaccines Rash 07/01/2013   Misc. sulfonamide containing compounds Rash 11/03/2021   Morphine and codeine Rash 06/18/2014   Naproxen Rash and Dermatitis 07/01/2013   Oxycodone -acetaminophen  Itching 03/12/2014   Pyridium [phenazopyridine hcl] Rash 06/18/2014   Sulfa antibiotics Rash and Other (See Comments) 06/18/2014   Sulfasalazine Rash and Dermatitis 06/18/2014   Vioxx [rofecoxib] Rash 06/18/2014    Family History  Problem Relation Age of Onset    Breast cancer Mother 8   Breast cancer Cousin 30   Breast cancer Cousin 74   Asthma Father    Asthma Sister    Asthma Child     Social History   Socioeconomic History   Marital status: Single    Spouse name: Not on file   Number of children: Not on file   Years of education: Not on file   Highest education level: Not on file  Occupational History   Not on file  Tobacco Use   Smoking status: Never   Smokeless tobacco: Never  Vaping Use   Vaping status: Never Used  Substance and Sexual Activity   Alcohol use: No   Drug use: No   Sexual activity: Not on file  Other Topics Concern   Not on file  Social History Narrative   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    Social Drivers of Health   Tobacco Use: Low Risk (12/29/2023)   Patient History    Smoking Tobacco Use: Never    Smokeless Tobacco Use: Never    Passive Exposure: Not on file  Financial Resource Strain: Medium Risk (10/02/2023)   Received from [facility]   Overall Financial Resource Strain (CARDIA)    Difficulty of Paying Living Expenses: Somewhat hard  Food Insecurity: No Food Insecurity (12/30/2023)   Epic    Worried About Programme Researcher, Broadcasting/film/video in the Last Year: Never true    Ran Out of Food in the Last Year: Never true  Recent Concern: Food Insecurity - Food Insecurity Present (10/02/2023)   Received from [facility]   Epic    Within the past 12 months, you worried that your food would run out before you got the money to buy more.: Sometimes true    Within the past 12 months, the food you bought just didn't last and you didn't have money to get more.: Sometimes true  Transportation Needs: No Transportation Needs (12/30/2023)   Epic    Lack of Transportation (Medical): No    Lack of Transportation (Non-Medical): No  Physical Activity: Not on file  Stress: Not on file  Social  Connections: Socially Isolated (08/22/2023)   Social Connection and Isolation Panel    Frequency of Communication with Friends and Family: More than three times a week    Frequency of Social Gatherings with Friends and Family: More than three times a week    Attends Religious Services: Never    Database Administrator or Organizations: No    Attends Banker Meetings: Never    Marital Status: Never married  Intimate Partner Violence: Not At  Risk (12/30/2023)   Epic    Fear of Current or Ex-Partner: No    Emotionally Abused: No    Physically Abused: No    Sexually Abused: No  Depression (PHQ2-9): Low Risk (11/28/2023)   Depression (PHQ2-9)    PHQ-2 Score: 0  Alcohol Screen: Not on file  Housing: Low Risk (12/30/2023)   Epic    Unable to Pay for Housing in the Last Year: No    Number of Times Moved in the Last Year: 0    Homeless in the Last Year: No  Recent Concern: Housing - High Risk (10/02/2023)   Received from [facility]   Epic    In the last 12 months, was there a time when you were not able to pay the mortgage or rent on time?: Yes    In the past 12 months, how many times have you moved where you were living?: 0    At any time in the past 12 months, were you homeless or living in a shelter (including now)?: No  Utilities: Not At Risk (12/30/2023)   Epic    Threatened with loss of utilities: No  Health Literacy: Not on file    Review of Systems: See HPI, otherwise negative ROS  Physical Exam: There were no vitals taken for this visit. General:   Alert,  pleasant and cooperative in NAD Head:  Normocephalic and atraumatic. Neck:  Supple; no masses or thyromegaly. Lungs:  Clear throughout to auscultation, normal respiratory effort.    Heart:  +S1, +S2, Regular rate and rhythm, No edema. Abdomen:  Soft, nontender and nondistended. Normal bowel sounds, without guarding, and without rebound.   Neurologic:  Alert and  oriented x4;  grossly normal  neurologically.  Impression/Plan: Debbie Sosa is here for an endoscopy  to be performed for  evaluation of dysphagia    Risks, benefits, limitations, and alternatives regarding endoscopy have been reviewed with the patient.  Questions have been answered.  All parties agreeable.   Ruel Kung, MD  01/25/2024, 7:35 AM  "

## 2024-01-25 NOTE — Op Note (Signed)
 [facility] Gastroenterology Patient Name: Debbie Sosa Procedure Date: 01/25/2024 8:00 AM MRN: [id number] Account #: [id number] Date of Birth: 09-25-1975 Admit Type: Outpatient Age: 49 Room: [facility] ENDO ROOM 2 Gender: Female Note Status: Finalized Instrument Name: Endoscope [id number] Procedure:             Upper GI endoscopy Indications:           Dysphagia Providers:             Ruel Kung MD, MD Referring MD:          Reyes BIRCH. Auston, MD (Referring MD) Medicines:             Monitored Anesthesia Care Complications:         No immediate complications. Procedure:             Pre-Anesthesia Assessment:                        - Prior to the procedure, a History and Physical was                         performed, and patient medications, allergies and                         sensitivities were reviewed. The patient's tolerance                         of previous anesthesia was reviewed.                        - The risks and benefits of the procedure and the                         sedation options and risks were discussed with the                         patient. All questions were answered and informed                         consent was obtained.                        - ASA Grade Assessment: II - A patient with mild                         systemic disease.                        After obtaining informed consent, the endoscope was                         passed under direct vision. Throughout the procedure,                         the patient's blood pressure, pulse, and oxygen                         saturations were monitored continuously. The Endoscope                         was introduced through  the mouth, and advanced to the                         jejunum. The upper GI endoscopy was technically                         difficult and complex due to the patient's oxygen                         desaturation. The patient tolerated the procedure                          fairly well. Findings:      The examined esophagus was normal. Biopsies were taken with a cold       forceps for histology.      Evidence of a Roux-en-Y gastrojejunostomy was found. The gastrojejunal       anastomosis was characterized by healthy appearing mucosa. This was       traversed. The pouch-to-jejunum limb was characterized by healthy       appearing mucosa. The jejunojejunal anastomosis was characterized by       healthy appearing mucosa.      The examined jejunum was normal. Impression:            - Normal esophagus. Biopsied.                        - Roux-en-Y gastrojejunostomy with gastrojejunal                         anastomosis characterized by healthy appearing mucosa.                        - Normal examined jejunum. Recommendation:        - Discharge patient to home (with escort).                        - Resume previous diet.                        - Continue present medications.                        - Await pathology results.                        - Return to my office as previously scheduled.                        - if biopsies are negative differentials for dysphagia                         would be esophageal dysmotility vs ?gastroparesis -                         may need to consider esophageal manometry Procedure Code(s):     --- Professional ---                        [phone], Esophagogastroduodenoscopy, flexible,  transoral; with biopsy, single or multiple Diagnosis Code(s):     --- Professional ---                        Z98.0, Intestinal bypass and anastomosis status                        R13.10, Dysphagia, unspecified CPT copyright 2022 American Medical Association. All rights reserved. The codes documented in this report are preliminary and upon coder review may  be revised to meet current compliance requirements. Ruel Kung, MD Ruel Kung MD, MD 01/25/2024 8:18:02 AM This report has been signed  electronically. Number of Addenda: 0 Note Initiated On: 01/25/2024 8:00 AM Estimated Blood Loss:  Estimated blood loss: none.      [facility]

## 2024-01-26 LAB — SURGICAL PATHOLOGY

## 2024-02-14 ENCOUNTER — Ambulatory Visit: Payer: Self-pay | Admitting: Gastroenterology

## 2024-05-28 ENCOUNTER — Inpatient Hospital Stay: Admitting: Internal Medicine

## 2024-05-28 ENCOUNTER — Inpatient Hospital Stay
